# Patient Record
Sex: Female | Born: 1935 | Race: White | Hispanic: No | Smoking: Never smoker
Health system: Southern US, Community
[De-identification: ages and names within clinical notes are randomized; demographics above are authoritative.]

## PROBLEM LIST (undated history)

## (undated) DIAGNOSIS — K661 Hemoperitoneum: Secondary | ICD-10-CM

## (undated) DIAGNOSIS — K5792 Diverticulitis of intestine, part unspecified, without perforation or abscess without bleeding: Secondary | ICD-10-CM

## (undated) DIAGNOSIS — E785 Hyperlipidemia, unspecified: Secondary | ICD-10-CM

## (undated) DIAGNOSIS — N182 Chronic kidney disease, stage 2 (mild): Secondary | ICD-10-CM

## (undated) DIAGNOSIS — I10 Essential (primary) hypertension: Secondary | ICD-10-CM

## (undated) DIAGNOSIS — I2510 Atherosclerotic heart disease of native coronary artery without angina pectoris: Secondary | ICD-10-CM

## (undated) DIAGNOSIS — I4891 Unspecified atrial fibrillation: Secondary | ICD-10-CM

## (undated) DIAGNOSIS — I739 Peripheral vascular disease, unspecified: Secondary | ICD-10-CM

## (undated) DIAGNOSIS — C801 Malignant (primary) neoplasm, unspecified: Secondary | ICD-10-CM

## (undated) DIAGNOSIS — E119 Type 2 diabetes mellitus without complications: Secondary | ICD-10-CM

## (undated) DIAGNOSIS — D509 Iron deficiency anemia, unspecified: Secondary | ICD-10-CM

## (undated) DIAGNOSIS — Z853 Personal history of malignant neoplasm of breast: Secondary | ICD-10-CM

## (undated) DIAGNOSIS — K683 Retroperitoneal hematoma: Secondary | ICD-10-CM

## (undated) DIAGNOSIS — J189 Pneumonia, unspecified organism: Secondary | ICD-10-CM

## (undated) HISTORY — DX: Unspecified atrial fibrillation: I48.91

## (undated) HISTORY — DX: Iron deficiency anemia, unspecified: D50.9

## (undated) HISTORY — DX: Chronic kidney disease, stage 2 (mild): N18.2

## (undated) HISTORY — DX: Malignant (primary) neoplasm, unspecified: C80.1

## (undated) HISTORY — DX: Personal history of malignant neoplasm of breast: Z85.3

## (undated) HISTORY — DX: Pneumonia, unspecified organism: J18.9

## (undated) HISTORY — PX: COLOSTOMY: SHX63

## (undated) HISTORY — DX: Hyperlipidemia, unspecified: E78.5

## (undated) HISTORY — DX: Type 2 diabetes mellitus without complications: E11.9

## (undated) HISTORY — DX: Retroperitoneal hematoma: K68.3

## (undated) HISTORY — DX: Essential (primary) hypertension: I10

## (undated) HISTORY — PX: PARTIAL COLECTOMY: SHX5273

## (undated) HISTORY — DX: Diverticulitis of intestine, part unspecified, without perforation or abscess without bleeding: K57.92

## (undated) HISTORY — DX: Peripheral vascular disease, unspecified: I73.9

## (undated) HISTORY — DX: Atherosclerotic heart disease of native coronary artery without angina pectoris: I25.10

## (undated) HISTORY — PX: CATARACT EXTRACTION: SUR2

## (undated) HISTORY — DX: Hemoperitoneum: K66.1

---

## 1960-10-05 HISTORY — PX: APPENDECTOMY: SHX54

## 1969-10-05 HISTORY — PX: DILATION AND CURETTAGE OF UTERUS: SHX78

## 1993-10-05 HISTORY — PX: DILATION AND CURETTAGE OF UTERUS: SHX78

## 1998-10-05 HISTORY — PX: CARDIAC CATHETERIZATION: SHX172

## 1999-04-23 ENCOUNTER — Inpatient Hospital Stay: Admission: RE | Admit: 1999-04-23 | Discharge: 1999-04-24 | Payer: Self-pay | Admitting: *Deleted

## 1999-09-02 ENCOUNTER — Encounter: Payer: Self-pay | Admitting: Surgery

## 1999-09-02 ENCOUNTER — Ambulatory Visit (HOSPITAL_BASED_OUTPATIENT_CLINIC_OR_DEPARTMENT_OTHER): Admission: RE | Admit: 1999-09-02 | Discharge: 1999-09-02 | Payer: Self-pay | Admitting: Surgery

## 1999-09-15 ENCOUNTER — Encounter: Admission: RE | Admit: 1999-09-15 | Discharge: 1999-12-14 | Payer: Self-pay | Admitting: Radiation Oncology

## 1999-10-14 ENCOUNTER — Encounter: Payer: Self-pay | Admitting: Surgery

## 1999-10-14 ENCOUNTER — Ambulatory Visit (HOSPITAL_COMMUNITY): Admission: RE | Admit: 1999-10-14 | Discharge: 1999-10-15 | Payer: Self-pay | Admitting: Surgery

## 1999-10-14 ENCOUNTER — Encounter (INDEPENDENT_AMBULATORY_CARE_PROVIDER_SITE_OTHER): Payer: Self-pay | Admitting: *Deleted

## 1999-11-06 HISTORY — PX: BREAST SURGERY: SHX581

## 1999-12-15 ENCOUNTER — Encounter: Admission: RE | Admit: 1999-12-15 | Discharge: 2000-03-14 | Payer: Self-pay | Admitting: Radiation Oncology

## 2000-10-13 ENCOUNTER — Other Ambulatory Visit: Admission: RE | Admit: 2000-10-13 | Discharge: 2000-10-13 | Payer: Self-pay | Admitting: Family Medicine

## 2000-12-03 ENCOUNTER — Encounter: Payer: Self-pay | Admitting: Family Medicine

## 2000-12-03 LAB — CONVERTED CEMR LAB
Hgb A1c MFr Bld: 7.2 %
Microalbumin U total vol: 2.9 mg/L

## 2001-09-04 ENCOUNTER — Encounter: Payer: Self-pay | Admitting: Family Medicine

## 2001-09-04 LAB — CONVERTED CEMR LAB: Hgb A1c MFr Bld: 7 %

## 2001-12-03 ENCOUNTER — Encounter: Payer: Self-pay | Admitting: Family Medicine

## 2001-12-03 LAB — CONVERTED CEMR LAB: Hgb A1c MFr Bld: 6.8 %

## 2001-12-23 ENCOUNTER — Other Ambulatory Visit: Admission: RE | Admit: 2001-12-23 | Discharge: 2001-12-23 | Payer: Self-pay | Admitting: Family Medicine

## 2002-01-19 ENCOUNTER — Encounter: Admission: RE | Admit: 2002-01-19 | Discharge: 2002-01-19 | Payer: Self-pay | Admitting: Cardiology

## 2002-01-19 ENCOUNTER — Encounter: Payer: Self-pay | Admitting: Cardiology

## 2002-01-25 ENCOUNTER — Ambulatory Visit (HOSPITAL_COMMUNITY): Admission: RE | Admit: 2002-01-25 | Discharge: 2002-01-25 | Payer: Self-pay | Admitting: Cardiology

## 2002-01-25 HISTORY — PX: CARDIAC CATHETERIZATION: SHX172

## 2002-02-07 ENCOUNTER — Encounter: Payer: Self-pay | Admitting: Cardiothoracic Surgery

## 2002-02-08 ENCOUNTER — Encounter: Payer: Self-pay | Admitting: Cardiothoracic Surgery

## 2002-02-08 ENCOUNTER — Ambulatory Visit (HOSPITAL_COMMUNITY): Admission: RE | Admit: 2002-02-08 | Discharge: 2002-02-08 | Payer: Self-pay | Admitting: Cardiothoracic Surgery

## 2002-02-09 ENCOUNTER — Inpatient Hospital Stay (HOSPITAL_COMMUNITY): Admission: RE | Admit: 2002-02-09 | Discharge: 2002-02-16 | Payer: Self-pay | Admitting: Cardiothoracic Surgery

## 2002-02-09 ENCOUNTER — Encounter: Payer: Self-pay | Admitting: Cardiothoracic Surgery

## 2002-02-09 HISTORY — PX: CORONARY ARTERY BYPASS GRAFT: SHX141

## 2002-02-10 ENCOUNTER — Encounter: Payer: Self-pay | Admitting: Cardiothoracic Surgery

## 2002-02-11 ENCOUNTER — Encounter: Payer: Self-pay | Admitting: Cardiothoracic Surgery

## 2002-02-14 ENCOUNTER — Encounter: Payer: Self-pay | Admitting: Cardiothoracic Surgery

## 2002-02-21 ENCOUNTER — Ambulatory Visit: Admission: RE | Admit: 2002-02-21 | Discharge: 2002-05-22 | Payer: Self-pay | Admitting: Radiation Oncology

## 2002-02-27 ENCOUNTER — Ambulatory Visit (HOSPITAL_COMMUNITY): Admission: RE | Admit: 2002-02-27 | Discharge: 2002-02-27 | Payer: Self-pay | Admitting: Radiation Oncology

## 2002-03-07 ENCOUNTER — Encounter (HOSPITAL_COMMUNITY): Admission: RE | Admit: 2002-03-07 | Discharge: 2002-06-05 | Payer: Self-pay | Admitting: Family Medicine

## 2002-03-10 ENCOUNTER — Encounter: Payer: Self-pay | Admitting: Cardiothoracic Surgery

## 2002-03-10 ENCOUNTER — Encounter: Admission: RE | Admit: 2002-03-10 | Discharge: 2002-03-10 | Payer: Self-pay | Admitting: Cardiothoracic Surgery

## 2002-07-05 ENCOUNTER — Encounter: Payer: Self-pay | Admitting: Family Medicine

## 2002-07-05 LAB — CONVERTED CEMR LAB: Hgb A1c MFr Bld: 7.9 %

## 2002-09-08 ENCOUNTER — Encounter: Payer: Self-pay | Admitting: Radiation Oncology

## 2002-09-08 ENCOUNTER — Ambulatory Visit (HOSPITAL_COMMUNITY): Admission: RE | Admit: 2002-09-08 | Discharge: 2002-09-08 | Payer: Self-pay | Admitting: Radiation Oncology

## 2002-11-23 ENCOUNTER — Emergency Department (HOSPITAL_COMMUNITY): Admission: EM | Admit: 2002-11-23 | Discharge: 2002-11-24 | Payer: Self-pay | Admitting: Emergency Medicine

## 2002-11-23 ENCOUNTER — Encounter: Payer: Self-pay | Admitting: Emergency Medicine

## 2002-11-30 ENCOUNTER — Encounter: Admission: RE | Admit: 2002-11-30 | Discharge: 2002-11-30 | Payer: Self-pay | Admitting: Cardiology

## 2002-11-30 ENCOUNTER — Encounter: Payer: Self-pay | Admitting: Cardiology

## 2002-12-04 ENCOUNTER — Encounter: Admission: RE | Admit: 2002-12-04 | Discharge: 2002-12-04 | Payer: Self-pay | Admitting: Cardiology

## 2002-12-04 ENCOUNTER — Encounter: Payer: Self-pay | Admitting: Cardiology

## 2002-12-07 ENCOUNTER — Ambulatory Visit: Admission: RE | Admit: 2002-12-07 | Discharge: 2002-12-18 | Payer: Self-pay | Admitting: Radiation Oncology

## 2002-12-21 ENCOUNTER — Encounter (INDEPENDENT_AMBULATORY_CARE_PROVIDER_SITE_OTHER): Payer: Self-pay | Admitting: *Deleted

## 2002-12-21 ENCOUNTER — Encounter (INDEPENDENT_AMBULATORY_CARE_PROVIDER_SITE_OTHER): Payer: Self-pay

## 2002-12-21 ENCOUNTER — Encounter: Payer: Self-pay | Admitting: Cardiothoracic Surgery

## 2002-12-21 ENCOUNTER — Ambulatory Visit (HOSPITAL_COMMUNITY): Admission: RE | Admit: 2002-12-21 | Discharge: 2002-12-21 | Payer: Self-pay | Admitting: Cardiothoracic Surgery

## 2003-02-03 ENCOUNTER — Encounter: Payer: Self-pay | Admitting: Family Medicine

## 2003-02-03 HISTORY — PX: SHOULDER ARTHROSCOPY: SHX128

## 2003-02-03 LAB — CONVERTED CEMR LAB
Hgb A1c MFr Bld: 6.7 %
Microalbumin U total vol: 14.1 mg/L

## 2003-02-07 ENCOUNTER — Other Ambulatory Visit: Admission: RE | Admit: 2003-02-07 | Discharge: 2003-02-07 | Payer: Self-pay | Admitting: Family Medicine

## 2003-02-23 ENCOUNTER — Encounter: Admission: RE | Admit: 2003-02-23 | Discharge: 2003-02-23 | Payer: Self-pay | Admitting: Cardiothoracic Surgery

## 2003-02-23 ENCOUNTER — Encounter: Payer: Self-pay | Admitting: Cardiothoracic Surgery

## 2003-03-16 ENCOUNTER — Encounter: Admission: RE | Admit: 2003-03-16 | Discharge: 2003-03-16 | Payer: Self-pay | Admitting: Internal Medicine

## 2003-03-16 ENCOUNTER — Encounter: Payer: Self-pay | Admitting: Orthopaedic Surgery

## 2003-04-02 ENCOUNTER — Encounter: Payer: Self-pay | Admitting: Orthopaedic Surgery

## 2003-04-02 ENCOUNTER — Encounter: Admission: RE | Admit: 2003-04-02 | Discharge: 2003-04-02 | Payer: Self-pay | Admitting: Orthopaedic Surgery

## 2003-04-03 ENCOUNTER — Ambulatory Visit (HOSPITAL_BASED_OUTPATIENT_CLINIC_OR_DEPARTMENT_OTHER): Admission: RE | Admit: 2003-04-03 | Discharge: 2003-04-03 | Payer: Self-pay | Admitting: Orthopaedic Surgery

## 2003-11-30 ENCOUNTER — Encounter: Admission: RE | Admit: 2003-11-30 | Discharge: 2003-11-30 | Payer: Self-pay | Admitting: Cardiothoracic Surgery

## 2003-12-04 ENCOUNTER — Encounter: Payer: Self-pay | Admitting: Family Medicine

## 2003-12-04 LAB — CONVERTED CEMR LAB
Hgb A1c MFr Bld: 6.6 %
Hgb A1c MFr Bld: 6.6 %
Microalbumin U total vol: 2.8 mg/L
Microalbumin U total vol: 2.8 mg/L

## 2004-03-05 ENCOUNTER — Encounter: Payer: Self-pay | Admitting: Family Medicine

## 2004-03-05 LAB — CONVERTED CEMR LAB: Hgb A1c MFr Bld: 6.4 %

## 2004-08-05 ENCOUNTER — Ambulatory Visit: Payer: Self-pay | Admitting: Family Medicine

## 2004-08-05 LAB — CONVERTED CEMR LAB
Hgb A1c MFr Bld: 6.9 %
Hgb A1c MFr Bld: 6.9 %

## 2004-08-07 ENCOUNTER — Ambulatory Visit: Payer: Self-pay | Admitting: Family Medicine

## 2004-08-07 ENCOUNTER — Other Ambulatory Visit: Admission: RE | Admit: 2004-08-07 | Discharge: 2004-08-07 | Payer: Self-pay | Admitting: Family Medicine

## 2004-08-20 ENCOUNTER — Ambulatory Visit: Payer: Self-pay | Admitting: Family Medicine

## 2004-09-11 HISTORY — PX: HEMORRHOID SURGERY: SHX153

## 2004-11-05 ENCOUNTER — Ambulatory Visit: Payer: Self-pay | Admitting: Family Medicine

## 2004-11-05 LAB — CONVERTED CEMR LAB
Hgb A1c MFr Bld: 6.4 %
Hgb A1c MFr Bld: 6.4 %

## 2004-11-07 ENCOUNTER — Ambulatory Visit: Payer: Self-pay | Admitting: Family Medicine

## 2005-05-05 ENCOUNTER — Ambulatory Visit: Payer: Self-pay | Admitting: Family Medicine

## 2005-05-05 LAB — CONVERTED CEMR LAB
Hgb A1c MFr Bld: 7 %
Hgb A1c MFr Bld: 7 %

## 2005-05-07 ENCOUNTER — Ambulatory Visit: Payer: Self-pay | Admitting: Family Medicine

## 2005-07-05 ENCOUNTER — Encounter: Payer: Self-pay | Admitting: Family Medicine

## 2005-07-05 LAB — CONVERTED CEMR LAB
Hgb A1c MFr Bld: 6.4 %
Hgb A1c MFr Bld: 6.4 %

## 2005-08-04 ENCOUNTER — Ambulatory Visit: Payer: Self-pay | Admitting: Family Medicine

## 2005-08-06 ENCOUNTER — Ambulatory Visit: Payer: Self-pay | Admitting: Family Medicine

## 2005-10-05 ENCOUNTER — Encounter: Payer: Self-pay | Admitting: Family Medicine

## 2005-10-05 LAB — CONVERTED CEMR LAB
Hgb A1c MFr Bld: 6.6 %
Hgb A1c MFr Bld: 6.6 %
Microalbumin U total vol: 3.6 mg/L

## 2005-10-22 ENCOUNTER — Ambulatory Visit: Payer: Self-pay | Admitting: Family Medicine

## 2005-10-26 ENCOUNTER — Encounter: Payer: Self-pay | Admitting: Family Medicine

## 2005-10-26 ENCOUNTER — Other Ambulatory Visit: Admission: RE | Admit: 2005-10-26 | Discharge: 2005-10-26 | Payer: Self-pay | Admitting: Family Medicine

## 2005-10-26 ENCOUNTER — Ambulatory Visit: Payer: Self-pay | Admitting: Family Medicine

## 2005-11-18 ENCOUNTER — Ambulatory Visit: Payer: Self-pay | Admitting: Family Medicine

## 2006-01-03 ENCOUNTER — Encounter: Payer: Self-pay | Admitting: Family Medicine

## 2006-01-03 LAB — CONVERTED CEMR LAB
Hgb A1c MFr Bld: 6.5 %
Hgb A1c MFr Bld: 6.5 %

## 2006-01-25 ENCOUNTER — Ambulatory Visit: Payer: Self-pay | Admitting: Family Medicine

## 2006-01-27 ENCOUNTER — Ambulatory Visit: Payer: Self-pay | Admitting: Family Medicine

## 2006-04-04 ENCOUNTER — Encounter: Payer: Self-pay | Admitting: Family Medicine

## 2006-04-04 LAB — CONVERTED CEMR LAB
Hgb A1c MFr Bld: 6.5 %
Hgb A1c MFr Bld: 6.5 %

## 2006-04-20 ENCOUNTER — Ambulatory Visit: Payer: Self-pay | Admitting: Family Medicine

## 2006-04-22 ENCOUNTER — Ambulatory Visit: Payer: Self-pay | Admitting: Family Medicine

## 2006-06-07 ENCOUNTER — Emergency Department (HOSPITAL_COMMUNITY): Admission: EM | Admit: 2006-06-07 | Discharge: 2006-06-07 | Payer: Self-pay | Admitting: Emergency Medicine

## 2006-10-05 ENCOUNTER — Encounter: Payer: Self-pay | Admitting: Family Medicine

## 2006-10-05 LAB — CONVERTED CEMR LAB
Hgb A1c MFr Bld: 7.2 %
Microalbumin U total vol: 24.5 mg/L
Pap Smear: NORMAL

## 2006-10-11 ENCOUNTER — Ambulatory Visit: Payer: Self-pay | Admitting: Family Medicine

## 2006-10-12 ENCOUNTER — Ambulatory Visit: Payer: Self-pay | Admitting: Family Medicine

## 2006-11-03 ENCOUNTER — Encounter: Payer: Self-pay | Admitting: Family Medicine

## 2006-11-03 ENCOUNTER — Other Ambulatory Visit: Admission: RE | Admit: 2006-11-03 | Discharge: 2006-11-03 | Payer: Self-pay | Admitting: Family Medicine

## 2006-11-03 ENCOUNTER — Ambulatory Visit: Payer: Self-pay | Admitting: Family Medicine

## 2006-12-14 ENCOUNTER — Ambulatory Visit: Payer: Self-pay | Admitting: Family Medicine

## 2006-12-23 ENCOUNTER — Ambulatory Visit: Payer: Self-pay | Admitting: Family Medicine

## 2007-01-11 ENCOUNTER — Encounter: Admission: RE | Admit: 2007-01-11 | Discharge: 2007-01-11 | Payer: Self-pay | Admitting: Cardiology

## 2007-01-13 ENCOUNTER — Ambulatory Visit (HOSPITAL_COMMUNITY): Admission: RE | Admit: 2007-01-13 | Discharge: 2007-01-13 | Payer: Self-pay | Admitting: Cardiology

## 2007-01-13 HISTORY — PX: CARDIAC CATHETERIZATION: SHX172

## 2007-03-11 ENCOUNTER — Inpatient Hospital Stay (HOSPITAL_COMMUNITY): Admission: EM | Admit: 2007-03-11 | Discharge: 2007-03-15 | Payer: Self-pay | Admitting: Emergency Medicine

## 2007-04-04 ENCOUNTER — Encounter (INDEPENDENT_AMBULATORY_CARE_PROVIDER_SITE_OTHER): Payer: Self-pay | Admitting: *Deleted

## 2007-04-06 ENCOUNTER — Encounter: Payer: Medicare Other | Admitting: Cardiology

## 2007-04-18 ENCOUNTER — Ambulatory Visit: Payer: Self-pay | Admitting: Family Medicine

## 2007-04-19 ENCOUNTER — Encounter: Payer: Self-pay | Admitting: Family Medicine

## 2007-04-19 ENCOUNTER — Ambulatory Visit: Payer: Self-pay | Admitting: Family Medicine

## 2007-04-19 DIAGNOSIS — I739 Peripheral vascular disease, unspecified: Secondary | ICD-10-CM | POA: Insufficient documentation

## 2007-04-19 DIAGNOSIS — M810 Age-related osteoporosis without current pathological fracture: Secondary | ICD-10-CM | POA: Insufficient documentation

## 2007-04-19 DIAGNOSIS — I10 Essential (primary) hypertension: Secondary | ICD-10-CM

## 2007-04-19 DIAGNOSIS — I4891 Unspecified atrial fibrillation: Secondary | ICD-10-CM

## 2007-04-19 DIAGNOSIS — I2510 Atherosclerotic heart disease of native coronary artery without angina pectoris: Secondary | ICD-10-CM

## 2007-04-19 DIAGNOSIS — E785 Hyperlipidemia, unspecified: Secondary | ICD-10-CM

## 2007-04-19 DIAGNOSIS — E1159 Type 2 diabetes mellitus with other circulatory complications: Secondary | ICD-10-CM

## 2007-04-19 LAB — CONVERTED CEMR LAB
ALT: 13 units/L (ref 0–35)
AST: 17 units/L (ref 0–37)
Basophils Absolute: 0 10*3/uL (ref 0.0–0.1)
Basophils Relative: 0.6 % (ref 0.0–1.0)
Cholesterol: 162 mg/dL (ref 0–200)
Eosinophils Absolute: 0.2 10*3/uL (ref 0.0–0.6)
Eosinophils Relative: 3.1 % (ref 0.0–5.0)
HCT: 33.1 % — ABNORMAL LOW (ref 36.0–46.0)
HDL: 41.9 mg/dL (ref >39.0)
Hemoglobin: 11.1 g/dL — ABNORMAL LOW (ref 12.0–15.0)
Hgb A1c MFr Bld: 6.8 % — ABNORMAL HIGH (ref 4.6–6.0)
Iron: 61 ug/dL (ref 42–145)
LDL Cholesterol: 83 mg/dL (ref 0–99)
Lymphocytes Relative: 23.7 % (ref 12.0–46.0)
MCHC: 33.4 g/dL (ref 30.0–36.0)
MCV: 90.5 fL (ref 78.0–100.0)
Monocytes Absolute: 0.8 10*3/uL — ABNORMAL HIGH (ref 0.2–0.7)
Monocytes Relative: 11 % (ref 3.0–11.0)
Neutro Abs: 4.3 10*3/uL (ref 1.4–7.7)
Neutrophils Relative %: 61.6 % (ref 43.0–77.0)
Platelets: 296 10*3/uL (ref 150–400)
RBC: 3.66 M/uL — ABNORMAL LOW (ref 3.87–5.11)
RDW: 14.4 % (ref 11.5–14.6)
Total CHOL/HDL Ratio: 3.9
Triglycerides: 184 mg/dL — ABNORMAL HIGH (ref 0–149)
VLDL: 37 mg/dL (ref 0–40)
WBC: 6.9 10*3/uL (ref 4.5–10.5)

## 2007-04-20 ENCOUNTER — Ambulatory Visit: Payer: Self-pay | Admitting: Family Medicine

## 2007-04-20 DIAGNOSIS — D509 Iron deficiency anemia, unspecified: Secondary | ICD-10-CM

## 2007-04-21 ENCOUNTER — Encounter: Payer: Self-pay | Admitting: Family Medicine

## 2007-05-06 ENCOUNTER — Encounter: Payer: Self-pay | Admitting: Family Medicine

## 2007-05-06 ENCOUNTER — Encounter: Payer: Medicare Other | Admitting: Cardiology

## 2007-05-09 ENCOUNTER — Telehealth (INDEPENDENT_AMBULATORY_CARE_PROVIDER_SITE_OTHER): Payer: Self-pay | Admitting: *Deleted

## 2007-05-10 ENCOUNTER — Encounter: Payer: Self-pay | Admitting: Family Medicine

## 2007-05-31 ENCOUNTER — Encounter: Payer: Self-pay | Admitting: Family Medicine

## 2007-06-06 ENCOUNTER — Encounter: Payer: Medicare Other | Admitting: Cardiology

## 2007-07-06 ENCOUNTER — Encounter: Payer: Medicare Other | Admitting: Cardiology

## 2007-07-28 ENCOUNTER — Ambulatory Visit: Payer: Self-pay | Admitting: Family Medicine

## 2007-08-25 ENCOUNTER — Encounter: Payer: Self-pay | Admitting: Family Medicine

## 2007-11-07 ENCOUNTER — Ambulatory Visit: Payer: Self-pay | Admitting: Family Medicine

## 2007-11-07 LAB — CONVERTED CEMR LAB
ALT: 14 units/L (ref 0–35)
Basophils Relative: 0.3 % (ref 0.0–1.0)
Bilirubin, Direct: 0.1 mg/dL (ref 0.0–0.3)
CO2: 28 meq/L (ref 19–32)
Calcium: 9.3 mg/dL (ref 8.4–10.5)
Creatinine,U: 98.3 mg/dL
Eosinophils Absolute: 0.2 10*3/uL (ref 0.0–0.6)
Eosinophils Relative: 2.9 % (ref 0.0–5.0)
GFR calc Af Amer: 57 mL/min
GFR calc non Af Amer: 47 mL/min
Glucose, Bld: 82 mg/dL (ref 70–99)
HCT: 36.2 % (ref 36.0–46.0)
Hemoglobin: 11.7 g/dL — ABNORMAL LOW (ref 12.0–15.0)
Hgb A1c MFr Bld: 7.1 % — ABNORMAL HIGH (ref 4.6–6.0)
Lymphocytes Relative: 24.4 % (ref 12.0–46.0)
MCV: 92.1 fL (ref 78.0–100.0)
Microalb Creat Ratio: 23.4 mg/g (ref 0.0–30.0)
Monocytes Absolute: 0.8 10*3/uL — ABNORMAL HIGH (ref 0.2–0.7)
Neutro Abs: 4.9 10*3/uL (ref 1.4–7.7)
Neutrophils Relative %: 62.6 % (ref 43.0–77.0)
Platelets: 285 10*3/uL (ref 150–400)
Potassium: 4.5 meq/L (ref 3.5–5.1)
Sodium: 142 meq/L (ref 135–145)
Total Protein: 6.6 g/dL (ref 6.0–8.3)
Triglycerides: 178 mg/dL — ABNORMAL HIGH (ref 0–149)
VLDL: 36 mg/dL (ref 0–40)
WBC: 7.8 10*3/uL (ref 4.5–10.5)

## 2007-11-08 ENCOUNTER — Ambulatory Visit: Payer: Self-pay | Admitting: Family Medicine

## 2007-11-08 ENCOUNTER — Other Ambulatory Visit: Admission: RE | Admit: 2007-11-08 | Discharge: 2007-11-08 | Payer: Self-pay | Admitting: Family Medicine

## 2007-11-08 ENCOUNTER — Encounter: Payer: Self-pay | Admitting: Family Medicine

## 2007-11-08 LAB — CONVERTED CEMR LAB: Pap Smear: NORMAL

## 2007-11-11 ENCOUNTER — Encounter (INDEPENDENT_AMBULATORY_CARE_PROVIDER_SITE_OTHER): Payer: Self-pay | Admitting: *Deleted

## 2007-11-16 ENCOUNTER — Encounter (INDEPENDENT_AMBULATORY_CARE_PROVIDER_SITE_OTHER): Payer: Self-pay | Admitting: *Deleted

## 2007-11-30 ENCOUNTER — Encounter: Payer: Self-pay | Admitting: Family Medicine

## 2008-01-06 ENCOUNTER — Ambulatory Visit: Payer: Self-pay | Admitting: Family Medicine

## 2008-01-06 LAB — CONVERTED CEMR LAB
OCCULT 2: NEGATIVE
OCCULT 3: NEGATIVE

## 2008-01-09 ENCOUNTER — Encounter (INDEPENDENT_AMBULATORY_CARE_PROVIDER_SITE_OTHER): Payer: Self-pay | Admitting: *Deleted

## 2008-03-30 ENCOUNTER — Encounter: Payer: Self-pay | Admitting: Family Medicine

## 2008-04-04 ENCOUNTER — Encounter: Payer: Self-pay | Admitting: Family Medicine

## 2008-04-10 ENCOUNTER — Encounter (INDEPENDENT_AMBULATORY_CARE_PROVIDER_SITE_OTHER): Payer: Self-pay | Admitting: *Deleted

## 2008-05-08 ENCOUNTER — Ambulatory Visit: Payer: Self-pay | Admitting: Family Medicine

## 2008-05-24 ENCOUNTER — Ambulatory Visit: Payer: Self-pay | Admitting: Family Medicine

## 2008-05-24 DIAGNOSIS — R0989 Other specified symptoms and signs involving the circulatory and respiratory systems: Secondary | ICD-10-CM | POA: Insufficient documentation

## 2008-05-28 LAB — CONVERTED CEMR LAB: Vit D, 1,25-Dihydroxy: 11 — ABNORMAL LOW (ref 30–89)

## 2008-07-02 ENCOUNTER — Ambulatory Visit: Payer: Self-pay | Admitting: Family Medicine

## 2008-07-30 ENCOUNTER — Ambulatory Visit: Payer: Self-pay | Admitting: Family Medicine

## 2008-07-31 LAB — CONVERTED CEMR LAB: Vit D, 1,25-Dihydroxy: 24 — ABNORMAL LOW (ref 30–89)

## 2008-08-01 ENCOUNTER — Ambulatory Visit: Payer: Self-pay | Admitting: Family Medicine

## 2008-10-09 ENCOUNTER — Encounter: Payer: Self-pay | Admitting: Family Medicine

## 2008-11-21 ENCOUNTER — Ambulatory Visit: Payer: Self-pay | Admitting: Family Medicine

## 2008-11-22 LAB — CONVERTED CEMR LAB
Basophils Absolute: 0.1 10*3/uL (ref 0.0–0.1)
Basophils Relative: 1.6 % (ref 0.0–3.0)
Calcium: 9.4 mg/dL (ref 8.4–10.5)
Cholesterol: 178 mg/dL (ref 0–200)
Creatinine, Ser: 1.2 mg/dL (ref 0.4–1.2)
Creatinine,U: 72.2 mg/dL
Direct LDL: 87.3 mg/dL
Eosinophils Absolute: 0.3 10*3/uL (ref 0.0–0.7)
GFR calc Af Amer: 57 mL/min
GFR calc non Af Amer: 47 mL/min
HDL: 39.7 mg/dL (ref >39.0)
Hemoglobin: 12.3 g/dL (ref 12.0–15.0)
Hgb A1c MFr Bld: 7.2 % — ABNORMAL HIGH (ref 4.6–6.0)
MCHC: 34.1 g/dL (ref 30.0–36.0)
MCV: 91.9 fL (ref 78.0–100.0)
Microalb Creat Ratio: 5.5 mg/g (ref 0.0–30.0)
Microalb, Ur: 0.4 mg/dL (ref 0.0–1.9)
Neutro Abs: 4.2 10*3/uL (ref 1.4–7.7)
RBC: 3.94 M/uL (ref 3.87–5.11)
RDW: 13.9 % (ref 11.5–14.6)
Sodium: 140 meq/L (ref 135–145)
Total Bilirubin: 0.8 mg/dL (ref 0.3–1.2)

## 2008-11-28 ENCOUNTER — Other Ambulatory Visit: Admission: RE | Admit: 2008-11-28 | Discharge: 2008-11-28 | Payer: Self-pay | Admitting: Family Medicine

## 2008-11-28 ENCOUNTER — Ambulatory Visit: Payer: Self-pay | Admitting: Family Medicine

## 2008-11-28 ENCOUNTER — Encounter: Payer: Self-pay | Admitting: Family Medicine

## 2008-11-28 DIAGNOSIS — E559 Vitamin D deficiency, unspecified: Secondary | ICD-10-CM

## 2008-11-28 LAB — HM PAP SMEAR

## 2008-11-30 ENCOUNTER — Encounter (INDEPENDENT_AMBULATORY_CARE_PROVIDER_SITE_OTHER): Payer: Self-pay | Admitting: *Deleted

## 2008-12-05 ENCOUNTER — Ambulatory Visit: Payer: Self-pay | Admitting: Family Medicine

## 2008-12-05 LAB — CONVERTED CEMR LAB: OCCULT 2: NEGATIVE

## 2008-12-10 ENCOUNTER — Encounter (INDEPENDENT_AMBULATORY_CARE_PROVIDER_SITE_OTHER): Payer: Self-pay | Admitting: *Deleted

## 2009-02-11 ENCOUNTER — Encounter: Payer: Self-pay | Admitting: Family Medicine

## 2009-02-13 ENCOUNTER — Encounter: Payer: Self-pay | Admitting: Family Medicine

## 2009-02-22 ENCOUNTER — Ambulatory Visit: Payer: Self-pay | Admitting: Family Medicine

## 2009-02-25 LAB — CONVERTED CEMR LAB: Vit D, 25-Hydroxy: 36 ng/mL (ref 30–89)

## 2009-02-26 ENCOUNTER — Ambulatory Visit: Payer: Self-pay | Admitting: Family Medicine

## 2009-04-05 ENCOUNTER — Encounter: Payer: Self-pay | Admitting: Family Medicine

## 2009-05-10 ENCOUNTER — Encounter: Payer: Self-pay | Admitting: Family Medicine

## 2009-07-01 ENCOUNTER — Ambulatory Visit: Payer: Self-pay | Admitting: Family Medicine

## 2009-07-01 LAB — CONVERTED CEMR LAB: Hgb A1c MFr Bld: 7.3 % — ABNORMAL HIGH (ref 4.6–6.5)

## 2009-07-02 LAB — CONVERTED CEMR LAB: Vit D, 25-Hydroxy: 33 ng/mL (ref 30–89)

## 2009-07-04 ENCOUNTER — Ambulatory Visit: Payer: Self-pay | Admitting: Family Medicine

## 2009-07-11 ENCOUNTER — Ambulatory Visit: Payer: Medicare Other | Admitting: Ophthalmology

## 2009-07-22 ENCOUNTER — Ambulatory Visit: Payer: Medicare Other | Admitting: Ophthalmology

## 2009-08-26 ENCOUNTER — Ambulatory Visit: Payer: Self-pay | Admitting: Family Medicine

## 2009-08-26 LAB — CONVERTED CEMR LAB
Bilirubin Urine: NEGATIVE
Glucose, Urine, Semiquant: NEGATIVE
Ketones, urine, test strip: NEGATIVE
Specific Gravity, Urine: 1.015

## 2009-09-18 ENCOUNTER — Encounter: Payer: Self-pay | Admitting: Family Medicine

## 2009-10-29 ENCOUNTER — Ambulatory Visit: Payer: Medicare Other | Admitting: Ophthalmology

## 2009-11-11 ENCOUNTER — Ambulatory Visit: Payer: Medicare Other | Admitting: Ophthalmology

## 2009-12-02 ENCOUNTER — Ambulatory Visit: Payer: Self-pay | Admitting: Family Medicine

## 2009-12-02 LAB — CONVERTED CEMR LAB
AST: 20 units/L (ref 0–37)
Albumin: 3.8 g/dL (ref 3.5–5.2)
Alkaline Phosphatase: 41 units/L (ref 39–117)
BUN: 33 mg/dL — ABNORMAL HIGH (ref 6–23)
Basophils Relative: 0.9 % (ref 0.0–3.0)
CO2: 28 meq/L (ref 19–32)
Calcium: 9.7 mg/dL (ref 8.4–10.5)
Cholesterol: 149 mg/dL (ref 0–200)
Creatinine,U: 71.4 mg/dL
Eosinophils Absolute: 0.3 10*3/uL (ref 0.0–0.7)
Eosinophils Relative: 4.2 % (ref 0.0–5.0)
GFR calc non Af Amer: 46.89 mL/min (ref >60)
Glucose, Bld: 75 mg/dL (ref 70–99)
HCT: 35.4 % — ABNORMAL LOW (ref 36.0–46.0)
Lymphs Abs: 1.9 10*3/uL (ref 0.7–4.0)
MCHC: 32.5 g/dL (ref 30.0–36.0)
MCV: 93.9 fL (ref 78.0–100.0)
Microalb Creat Ratio: 9.8 mg/g (ref 0.0–30.0)
Monocytes Absolute: 0.6 10*3/uL (ref 0.1–1.0)
Neutro Abs: 4.2 10*3/uL (ref 1.4–7.7)
RBC: 3.77 M/uL — ABNORMAL LOW (ref 3.87–5.11)
Sodium: 142 meq/L (ref 135–145)
Total Protein: 6.4 g/dL (ref 6.0–8.3)
VLDL: 29.4 mg/dL (ref 0.0–40.0)
WBC: 7.1 10*3/uL (ref 4.5–10.5)

## 2009-12-03 LAB — CONVERTED CEMR LAB: Vit D, 25-Hydroxy: 47 ng/mL (ref 30–89)

## 2009-12-05 ENCOUNTER — Ambulatory Visit: Payer: Self-pay | Admitting: Family Medicine

## 2009-12-20 ENCOUNTER — Ambulatory Visit: Payer: Self-pay | Admitting: Family Medicine

## 2009-12-20 LAB — CONVERTED CEMR LAB
OCCULT 1: NEGATIVE
OCCULT 2: NEGATIVE
OCCULT 3: NEGATIVE

## 2009-12-23 ENCOUNTER — Encounter (INDEPENDENT_AMBULATORY_CARE_PROVIDER_SITE_OTHER): Payer: Self-pay | Admitting: *Deleted

## 2010-02-12 ENCOUNTER — Encounter: Admission: RE | Admit: 2010-02-12 | Discharge: 2010-02-12 | Payer: Self-pay | Admitting: Cardiology

## 2010-02-14 ENCOUNTER — Inpatient Hospital Stay (HOSPITAL_COMMUNITY): Admission: RE | Admit: 2010-02-14 | Discharge: 2010-02-16 | Payer: Self-pay | Admitting: Cardiology

## 2010-02-14 HISTORY — PX: CARDIAC CATHETERIZATION: SHX172

## 2010-03-19 ENCOUNTER — Encounter: Payer: Self-pay | Admitting: Family Medicine

## 2010-03-25 ENCOUNTER — Ambulatory Visit: Payer: Self-pay | Admitting: Family Medicine

## 2010-03-25 LAB — CONVERTED CEMR LAB: HDL goal, serum: 40 mg/dL

## 2010-04-02 ENCOUNTER — Telehealth: Payer: Self-pay | Admitting: Family Medicine

## 2010-04-16 ENCOUNTER — Encounter: Payer: Self-pay | Admitting: Family Medicine

## 2010-05-08 ENCOUNTER — Encounter (INDEPENDENT_AMBULATORY_CARE_PROVIDER_SITE_OTHER): Payer: Self-pay | Admitting: *Deleted

## 2010-06-12 ENCOUNTER — Encounter: Payer: Self-pay | Admitting: Family Medicine

## 2010-07-18 ENCOUNTER — Telehealth (INDEPENDENT_AMBULATORY_CARE_PROVIDER_SITE_OTHER): Payer: Self-pay | Admitting: *Deleted

## 2010-07-21 ENCOUNTER — Ambulatory Visit: Payer: Self-pay | Admitting: Family Medicine

## 2010-07-21 ENCOUNTER — Encounter: Payer: Self-pay | Admitting: Family Medicine

## 2010-07-21 LAB — CONVERTED CEMR LAB
ALT: 9 units/L (ref 0–35)
Albumin: 3.8 g/dL (ref 3.5–5.2)
BUN: 34 mg/dL — ABNORMAL HIGH (ref 6–23)
Basophils Relative: 0.5 % (ref 0.0–3.0)
Bilirubin, Direct: 0.1 mg/dL (ref 0.0–0.3)
CO2: 25 meq/L (ref 19–32)
Chloride: 107 meq/L (ref 96–112)
Creatinine, Ser: 1.1 mg/dL (ref 0.4–1.2)
Eosinophils Absolute: 0.2 10*3/uL (ref 0.0–0.7)
Glucose, Bld: 65 mg/dL — ABNORMAL LOW (ref 70–99)
HCT: 32.9 % — ABNORMAL LOW (ref 36.0–46.0)
Hemoglobin: 11 g/dL — ABNORMAL LOW (ref 12.0–15.0)
Hgb A1c MFr Bld: 7.4 % — ABNORMAL HIGH (ref 4.6–6.5)
Lymphocytes Relative: 23.5 % (ref 12.0–46.0)
MCHC: 33.4 g/dL (ref 30.0–36.0)
Microalb Creat Ratio: 2 mg/g (ref 0.0–30.0)
Monocytes Relative: 10.6 % (ref 3.0–12.0)
Neutro Abs: 4.4 10*3/uL (ref 1.4–7.7)
RBC: 3.58 M/uL — ABNORMAL LOW (ref 3.87–5.11)
Total CHOL/HDL Ratio: 4
Total Protein: 6.3 g/dL (ref 6.0–8.3)
Triglycerides: 133 mg/dL (ref 0.0–149.0)

## 2010-07-23 ENCOUNTER — Encounter: Payer: Self-pay | Admitting: Family Medicine

## 2010-07-24 ENCOUNTER — Ambulatory Visit: Payer: Self-pay | Admitting: Family Medicine

## 2010-10-02 ENCOUNTER — Encounter: Payer: Self-pay | Admitting: Family Medicine

## 2010-10-21 ENCOUNTER — Encounter: Payer: Self-pay | Admitting: Family Medicine

## 2010-10-21 LAB — HM MAMMOGRAPHY: HM Mammogram: ABNORMAL

## 2010-11-04 NOTE — Assessment & Plan Note (Signed)
Summary: CPX / LFW   Vital Signs:  Patient profile:   76 year old female Weight:      163 pounds Temp:     97.8 degrees F oral Pulse rate:   68 / minute Pulse rhythm:   regular BP sitting:   118 / 60  (left arm) Cuff size:   large  Vitals Entered By: Sydell Axon LPN (December 05, 1608 9:21 AM) CC: 30 Minute checkup, hemoccult cards given to patient   History of Present Illness: Pt here for followup. She feels well with no complaints. She fell in Dec and felt she had cracked a rib...it is still tender with certain movemnts. She had been on a stool at midnight in her closet and fell off the stool hitting a rack with her back. She has had two cattaract surgeries.  Preventive Screening-Counseling & Management  Alcohol-Tobacco     Alcohol drinks/day: 0     Smoking Status: never     Passive Smoke Exposure: no  Caffeine-Diet-Exercise     Caffeine use/day: 1     Does Patient Exercise: no     Type of exercise: walks     Times/week: 7  Problems Prior to Update: 1)  Uti  (ICD-599.0) 2)  Unspecified Vitamin D Deficiency  (ICD-268.9) 3)  Carotid Bruit  (ICD-785.9) 4)  Special Screening Malig Neoplasms Other Sites  (ICD-V76.49) 5)  Peripheral Vascular Disease  (ICD-443.9) 6)  Osteoporosis  (ICD-733.00) 7)  Hypertension  (ICD-401.9) 8)  Hyperlipidemia  (ICD-272.4) 9)  Diabetes Mellitus, Type II  (ICD-250.00) 10)  Coronary Artery Disease  (ICD-414.00) 11)  Breast Cancer, Hx Of, Right Breast  (ICD-V10.3) 12)  Atrial Fibrillation  (ICD-427.31) 13)  Anemia-iron Deficiency  (ICD-280.9)  Medications Prior to Update: 1)  Humalog 100 Unit/ml Soln (Insulin Lispro (Human)) .... Four Times A Day As Needed 2)  Metformin Hcl 500 Mg Tabs (Metformin Hcl) .Marland Kitchen.. 1 By Mouth At Bedtime 3)  Actos 45 Mg Tabs (Pioglitazone Hcl) .Marland Kitchen.. 1 Once Daily 4)  Sliding Scale Insulin 5)  Isosorbide Dinitrate 30 Mg  Tabs (Isosorbide Dinitrate) .Marland Kitchen.. 1 Daily By Mouth 6)  Benazepril Hcl 10 Mg  Tabs (Benazepril Hcl) .Marland Kitchen..  1 Daily By Mouth 7)  Nitroquick 0.4 Mg  Subl (Nitroglycerin) .... As Directed 8)  Simvastatin 40 Mg  Tabs (Simvastatin) .Marland Kitchen.. 1 Tablet By Mouth At Bedtime 9)  Triamterene-Hctz 37.5-25 Mg  Caps (Triamterene-Hctz) .Marland Kitchen.. 1daily By Mouth 10)  Sotalol Hcl (Af) 80 Mg  Tabs (Sotalol Hcl Af) .... 1/2 Twice A Day By Mouth 11)  Plavix 75 Mg  Tabs (Clopidogrel Bisulfate) .Marland Kitchen.. 1 Daily By Mouth 12)  Cartia Xt 120 Mg  Cp24 (Diltiazem Hcl Coated Beads) .Marland Kitchen.. 1 Daily By Mouth 13)  Glyburide-Metformin 5-500 Mg  Tabs (Glyburide-Metformin) .... 2 Tablets Am 14)  Onetouch Ultra Test   Strp (Glucose Blood) .... Test Four Times A Day For Sliding Scale 15)  Bd Insulin Syringe Ultrafine 31g X 5/16" 0.3 Ml  Misc (Insulin Syringe-Needle U-100) .... Use Daily and As Directed Sliding Scale 16)  Vitamin D 1000 Unit Caps (Cholecalciferol) .Marland Kitchen.. 1 Tablet Twice A Day 17)  Bayer Aspirin Ec Low Dose 81 Mg Tbec (Aspirin) .Marland Kitchen.. 1 Daily By Mouth 18)  Fish Oil 1200 Mg Caps (Omega-3 Fatty Acids) .Marland Kitchen.. 1 Tablet Twice A Day By Mouth 19)  Chewable Calcium/d 300-100 Mg-Unit Chew (Calcium-Vitamin D) .... Two Times A Day 20)  Cipro 500 Mg Tabs (Ciprofloxacin Hcl) .... One Taqb By Mouth Two  Times A Day  Allergies: 1)  ! * Tape  Past History:  Past Medical History: Last updated: 04/19/2007 Anemia-iron deficiency Atrial fibrillation Breast cancer, hx of Coronary artery disease Diabetes mellitus, type II Hyperlipidemia Hypertension Osteoporosis Peripheral vascular disease  Family History: Last updated: 2008-12-26 Father: Died 74 MI, HTN Mother: Died 22 [facility] Home, HTN Siblings: 3 brothers, 1 died September 09, 2023 HTN, CHF, bronchial CA, frequent pneumonia,                                      Agent Orange, (smoker), B-9 brain tumor                                  1 A (+) HTN, high chol. CABG                                  1 A (+) HTN, gout, obese CV:  (+) Father, died MI HBP:  (+) Father, Mother, brother, aunts, uncles, self DM:  (+)  Mother, self, uncle Breast CA:  Self, aunt Stroke:  (-)  Social History: Last updated: 04/19/2007 Never Smoked Alcohol use-no Drug use-no Marital Status: Married, lives with husband Children: 2, out of home Occupation: Retired, [employer] cafeteria/cook  Risk Factors: Alcohol Use: 0 (12/05/2009) Caffeine Use: 1 (12/05/2009) Exercise: no (12/05/2009)  Risk Factors: Smoking Status: never (12/05/2009) Passive Smoke Exposure: no (12/05/2009)  Past Surgical History: 1962         Appendectomy 1971         D&C  Miscarriage                  D&C  Abnml Bleeding 1995         D&C  Menorrhagia 1999          ETT  Rec to Cath   declined 2000          Cath/ Iliac Stent (Dr Guinevere Scarlet) yearly f/u 11/1999       Breast Quadrectomy  Breast Ca  (Dr Daphine Deutscher) 03/31/00      MRI Head - reportedly normal 01/06/02        Carotid U/S, mild plaque, bilateral essentially nml. 01/06/02        LE arterial study   Right superficial femoral 50% reduced flow Left superficial flow >50% 01/06/02        Renal artery U/S, right > 60% reduced flow  Left <60% 01/25/02      Cath, high grade LAD disease, total of 3 vess. involvement 02/09/02        CABG x 4 ([place]) 02/08/02        Chest CT, ? bone islands vs. mets vertebrae 5/03           MRI thoracic spine - bone island - multiple disc herniations 09/08/02      MRI - no change  - No f/u required 12/05/02        Chest CT wedge mass LLL with erosion 3/04           CT guided transthoracic needle biopsy - normal path 5/04            Abd/pelvic CT - nml. lung changes per above (done due to elevated LFT's) 5/04  Right shoulder arthroscopy with rotator cuff debridement 2/05           CT chest, resolved, lung changes of 2/04 09/11/04      Hemm binding Daphine Deutscher) 8/07            Left ACL strain 01/11/07        Cardiolite Pos - Cath 01/13/07      Cath - SV graft to diag. occluded but good  backfill  o/w ok 6/6-6/10/08  Hosp. Subendocardial  MI, stent 02/11/09      Stress Myoview EF 66%Mild  Isch, impr  10/10 and 2/11  Catarract surgeries  Review of Systems General:  Denies chills, fatigue, fever, sweats, weakness, and weight loss. Eyes:  Denies blurring, discharge, and eye pain. ENT:  Denies decreased hearing, earache, and ringing in ears. CV:  Denies chest pain or discomfort, fainting, fatigue, palpitations, shortness of breath with exertion, swelling of feet, swelling of hands, and weight gain. Resp:  Denies cough, shortness of breath, and wheezing. GI:  Denies excessive appetite, gas, and hemorrhoids; intermittent loose bowels. GU:  Denies discharge, dysuria, nocturia, and urinary frequency. MS:  Complains of joint pain, muscle aches, muscle weakness, and stiffness; denies low back pain. Derm:  Denies dryness, itching, and rash. Neuro:  Complains of poor balance; denies numbness, tingling, and tremors; needs to be more careful.Marland Kitchen  Physical Exam  General:  Well-developed,well-nourished,in no acute distress; alert,appropriate and cooperative throughout examination Head:  Normocephalic and atraumatic without obvious abnormalities. No apparent alopecia or balding. Sinuses nontender. Eyes:  Conjunctiva clear bilaterally. PERRL,  EOMI. Ears:  External ear exam shows no significant lesions or deformities.  Otoscopic examination reveals clear canals, tympanic membranes are intact bilaterally without bulging, retraction, inflammation or discharge. Hearing is grossly normal bilaterally. Cerumen bilat. Nose:  External nasal examination shows no deformity or inflammation. Nasal mucosa are pink and moist without lesions or exudates. Mouth:  Oral mucosa and oropharynx without lesions or exudates.  Teeth in good repair. Neck:  No deformities, masses, or tenderness noted. Chest Wall:  No deformities, masses, or tenderness noted. Breasts:  No mass, nodules, thickening, tenderness, bulging, retraction, inflamation, nipple discharge or skin changes noted.  R breast well healed. Lungs:  Normal  respiratory effort, chest expands symmetrically. Lungs are clear to auscultation, no crackles or wheezes. Heart:   S1 and S2 normal without gallop, murmur, click, rub or other extra sounds. Carotid with faint bruit on left. Abdomen:  Bowel sounds positive,abdomen soft and non-tender without masses, organomegaly or hernias noted. Rectal:  No external abnormalities noted. Normal sphincter tone. No rectal masses or tenderness. G neg. Genitalia:  Pelvic Exam:        External: normal female genitalia without lesions or masses        Vagina: normal without lesions or masses        Cervix: normal without lesions or masses        Adnexa: normal bimanual exam without masses or fullness        Uterus: normal by palpation        Pap smear: not performed Msk:  No deformity or scoliosis noted of thoracic or lumbar spine.   Pulses:  R and L carotid,radial,femoral,dorsalis pedis and posterior tibial pulses are full and equal bilaterally Extremities:  No clubbing, cyanosis, edema, or deformity noted with normal full range of motion of all joints.   Neurologic:  No cranial nerve deficits noted. Station and gait are normal. Plantar reflexes are down-going bilaterally. DTRs are  symmetrical throughout. Sensory, motor and coordinative functions appear intact. Skin:  Intact without suspicious lesions or rashes Cervical Nodes:  No lymphadenopathy noted Axillary Nodes:  No palpable lymphadenopathy Inguinal Nodes:  No significant adenopathy Psych:  Cognition and judgment appear intact. Alert and cooperative with normal attention span and concentration. No apparent delusions, illusions, hallucinations   Impression & Recommendations:  Problem # 1:  UNSPECIFIED VITAMIN D DEFICIENCY (ICD-268.9) Assessment Improved Therapeutic, cont curr med.  Problem # 2:  HYPERTENSION (ICD-401.9) Assessment: Unchanged Stable, cont curr meds. Her updated medication list for this problem includes:    Benazepril Hcl 10 Mg Tabs  (Benazepril hcl) .Marland Kitchen... 1 daily by mouth    Triamterene-hctz 37.5-25 Mg Caps (Triamterene-hctz) .Marland Kitchen... 1daily by mouth    Sotalol Hcl (af) 80 Mg Tabs (Sotalol hcl af) .Marland Kitchen... 1/2 twice a day by mouth    Cartia Xt 120 Mg Cp24 (Diltiazem hcl coated beads) .Marland Kitchen... 1 daily by mouth  BP today: 118/60 Prior BP: 138/68 (08/26/2009)  Labs Reviewed: K+: 5.3 (12/02/2009) Creat: : 1.2 (12/02/2009)   Chol: 149 (12/02/2009)   HDL: 47.90 (12/02/2009)   LDL: 72 (12/02/2009)   TG: 147.0 (12/02/2009)  Problem # 3:  HYPERLIPIDEMIA (ICD-272.4) Assessment: Unchanged Good control. Her updated medication list for this problem includes:    Simvastatin 40 Mg Tabs (Simvastatin) .Marland Kitchen... 1 tablet by mouth at bedtime  Labs Reviewed: SGOT: 20 (12/02/2009)   SGPT: 11 (12/02/2009)   HDL:47.90 (12/02/2009), 39.7 (11/21/2008)  LDL:72 (12/02/2009), DEL (04/54/0981)  Chol:149 (12/02/2009), 178 (11/21/2008)  Trig:147.0 (12/02/2009), 234 (11/21/2008)  Problem # 4:  DIABETES MELLITUS, TYPE II (ICD-250.00) Assessment: Unchanged God control. Cont as is but try to cut down on the chocolate. Her updated medication list for this problem includes:    Humalog 100 Unit/ml Soln (Insulin lispro (human)) .Marland Kitchen... Four times a day as needed    Metformin Hcl 500 Mg Tabs (Metformin hcl) .Marland Kitchen... 1 by mouth at bedtime    Actos 45 Mg Tabs (Pioglitazone hcl) .Marland Kitchen... 1 once daily    Benazepril Hcl 10 Mg Tabs (Benazepril hcl) .Marland Kitchen... 1 daily by mouth    Glyburide-metformin 5-500 Mg Tabs (Glyburide-metformin) .Marland Kitchen... 2 tablets am    Bayer Aspirin Ec Low Dose 81 Mg Tbec (Aspirin) .Marland Kitchen... 1 daily by mouth  Labs Reviewed: Creat: 1.2 (12/02/2009)   Microalbumin: 24.5 (10/05/2006) Reviewed HgBA1c results: 7.2 (12/02/2009)  7.3 (07/01/2009)  Problem # 5:  BREAST CANCER, HX OF, RIGHT BREAST (ICD-V10.3) Assessment: Unchanged Stable exam, mammo in July.  Problem # 6:  ATRIAL FIBRILLATION (ICD-427.31) Assessment: Unchanged Stable with great rate control. Her  updated medication list for this problem includes:    Sotalol Hcl (af) 80 Mg Tabs (Sotalol hcl af) .Marland Kitchen... 1/2 twice a day by mouth    Plavix 75 Mg Tabs (Clopidogrel bisulfate) .Marland Kitchen... 1 daily by mouth    Cartia Xt 120 Mg Cp24 (Diltiazem hcl coated beads) .Marland Kitchen... 1 daily by mouth    Bayer Aspirin Ec Low Dose 81 Mg Tbec (Aspirin) .Marland Kitchen... 1 daily by mouth  Problem # 7:  ANEMIA-IRON DEFICIENCY (ICD-280.9) Assessment: Unchanged Iron stav\ble but continues mildly anemic, presumably from myelodysplasia and chronic disease. Cont as curr doing. Hgb: 11.5 (12/02/2009)   Hct: 35.4 (12/02/2009)   Platelets: 268.0 (12/02/2009) RBC: 3.77 (12/02/2009)   RDW: 14.3 (12/02/2009)   WBC: 7.1 (12/02/2009) MCV: 93.9 (12/02/2009)   MCHC: 32.5 (12/02/2009) Iron: 71 (12/02/2009)   B12: 259 (12/02/2009)   TSH: 4.05 (11/21/2008)  Complete Medication List: 1)  Humalog 100 Unit/ml  Soln (Insulin lispro (human)) .... Four times a day as needed 2)  Metformin Hcl 500 Mg Tabs (Metformin hcl) .Marland Kitchen.. 1 by mouth at bedtime 3)  Actos 45 Mg Tabs (Pioglitazone hcl) .Marland Kitchen.. 1 once daily 4)  Sliding Scale Insulin  5)  Isosorbide Dinitrate 30 Mg Tabs (Isosorbide dinitrate) .Marland Kitchen.. 1 daily by mouth 6)  Benazepril Hcl 10 Mg Tabs (Benazepril hcl) .Marland Kitchen.. 1 daily by mouth 7)  Nitroquick 0.4 Mg Subl (Nitroglycerin) .... As directed 8)  Simvastatin 40 Mg Tabs (Simvastatin) .Marland Kitchen.. 1 tablet by mouth at bedtime 9)  Triamterene-hctz 37.5-25 Mg Caps (Triamterene-hctz) .Marland Kitchen.. 1daily by mouth 10)  Sotalol Hcl (af) 80 Mg Tabs (Sotalol hcl af) .... 1/2 twice a day by mouth 11)  Plavix 75 Mg Tabs (Clopidogrel bisulfate) .Marland Kitchen.. 1 daily by mouth 12)  Cartia Xt 120 Mg Cp24 (Diltiazem hcl coated beads) .Marland Kitchen.. 1 daily by mouth 13)  Glyburide-metformin 5-500 Mg Tabs (Glyburide-metformin) .... 2 tablets am 14)  Onetouch Ultra Test Strp (Glucose blood) .... Test four times a day for sliding scale 15)  Bd Insulin Syringe Ultrafine 31g X 5/16" 0.3 Ml Misc (Insulin syringe-needle  u-100) .... Use daily and as directed sliding scale 16)  Vitamin D 1000 Unit Caps (Cholecalciferol) .Marland Kitchen.. 1 tablet twice a day 17)  Bayer Aspirin Ec Low Dose 81 Mg Tbec (Aspirin) .Marland Kitchen.. 1 daily by mouth 18)  Fish Oil 1200 Mg Caps (Omega-3 fatty acids) .Marland Kitchen.. 1 tablet twice a day by mouth 19)  Chewable Calcium/d 300-100 Mg-unit Chew (Calcium-vitamin d) .... Two times a day  Patient Instructions: 1)  RTC 6 mos.  Current Allergies (reviewed today): ! * TAPE

## 2010-11-04 NOTE — Letter (Signed)
Summary: Nadara Eaton letter  [place] [facility]  [address] [place], [place] [address]   Phone: [phone]  Fax: [phone]       05/08/2010 MRN: [id number]  [facility] [address] [place] [facility] [place], [place]  [address]  Dear Ms. [facility] [place] and [place] announce the retirement of Arta Silence, M.D., from full-time practice at the [facility] office effective April 03, 2010 and his plans of returning part-time.  It is important to Dr. Hetty Ely and to our practice that you understand that [facility] has seven physicians in our office for your health care needs.  We will continue to offer the same exceptional care that you have today.    Dr. Hetty Ely has spoken to many of you about his plans for retirement and returning part-time in the fall.   We will continue to work with you through the transition to schedule appointments for you in the office and meet the high standards that [place] is committed to.   Again, it is with great pleasure that we share the news that Dr. Hetty Ely will return to [facility] in October of 2011 with a reduced schedule.    If you have any questions, or would like to request an appointment with one of our physicians, please call us at [phone] and press the option for Scheduling an appointment.  We take pleasure in providing you with excellent patient care and look forward to seeing you at your next office visit.  Our [facility] Physicians are:  Tillman Abide, M.D. Laurita Quint, M.D. Roxy Manns, M.D. Kerby Nora, M.D. Hannah Beat, M.D. Ruthe Mannan, M.D. We proudly welcomed Raechel Ache, M.D. and Eustaquio Boyden, M.D. to the practice in July/August 2011.  Sincerely,  [facility]

## 2010-11-04 NOTE — Progress Notes (Signed)
Summary: needs order for mammogram  Phone Note Call from Patient Call back at Home Phone [phone]   Caller: Patient Call For: Shaune Leeks MD Summary of Call: Pt needs order for mammogram, she goes to [facility]. Initial call taken by: Lowella Petties CMA,  April 02, 2010 4:42 PM

## 2010-11-04 NOTE — Letter (Signed)
Summary: [facility]   Imported By: Lester [place] 07/03/2010 11:22:28  _____________________________________________________________________  External Attachment:    Type:   Image     Comment:   External Document  Appended Document: [facility]    Clinical Lists Changes  Observations: Added new observation of PAST MED HX: Anemia-iron deficiency Atrial fibrillation Breast cancer, hx of, followed yearly by Dr. Daphine Deutscher with [facility] Coronary artery disease Diabetes mellitus, type II Hyperlipidemia Hypertension Osteoporosis Peripheral vascular disease  (07/03/2010 22:29)       Past History:  Past Medical History: Anemia-iron deficiency Atrial fibrillation Breast cancer, hx of, followed yearly by Dr. Daphine Deutscher with [facility] Coronary artery disease Diabetes mellitus, type II Hyperlipidemia Hypertension Osteoporosis Peripheral vascular disease

## 2010-11-04 NOTE — Letter (Signed)
Summary: [facility]  [facility]   Imported By: Maryln Gottron 08/11/2010 13:37:38  _____________________________________________________________________  External Attachment:    Type:   Image     Comment:   External Document

## 2010-11-04 NOTE — Assessment & Plan Note (Signed)
Summary: COUGH/CLE   Vital Signs:  Patient profile:   76 year old female Weight:      156.50 pounds Temp:     98.3 degrees F oral Pulse rate:   76 / minute Pulse rhythm:   regular BP sitting:   130 / 64  (left arm) Cuff size:   large  Vitals Entered By: Sydell Axon LPN (March 25, 2010 8:53 AM) CC: Productive cough/yellow-green, Lipid Management   History of Present Illness: Pt here for coughing since last Thu with production of green/yellow mucous. She is swallowing some of the other. She was seen by Dr Benard Halsted and told her lungs were clear, may have begun that night. She has no fever but gets cold, no headache except with coughing hard, minimal ear pain bilat, some runny nose of clear mucous, Coughing real hard produces dark yellowish brown mucous, no ST, no SOB unless coughing a loit...has ticklish throat and sleeping in altrnate bedroom to avoid bothering her husband, no N/v except once with severe coughing episode. She has taken nothing. She just had stenting done, this has not bothered her previous, recent  rib fracture.  Lipid Management History:      Positive NCEP/ATP III risk factors include female age 49 years old or older, diabetes, hypertension, ASHD (either angina/prior MI/prior CABG), and peripheral vascular disease.  Negative NCEP/ATP III risk factors include non-tobacco-user status.     Problems Prior to Update: 1)  Unspecified Vitamin D Deficiency  (ICD-268.9) 2)  Carotid Bruit  (ICD-785.9) 3)  Special Screening Malig Neoplasms Other Sites  (ICD-V76.49) 4)  Peripheral Vascular Disease  (ICD-443.9) 5)  Osteoporosis  (ICD-733.00) 6)  Hypertension  (ICD-401.9) 7)  Hyperlipidemia  (ICD-272.4) 8)  Diabetes Mellitus, Type II  (ICD-250.00) 9)  Coronary Artery Disease  (ICD-414.00) 10)  Breast Cancer, Hx Of, Right Breast  (ICD-V10.3) 11)  Atrial Fibrillation  (ICD-427.31) 12)  Anemia-iron Deficiency  (ICD-280.9)  Medications Prior to Update: 1)  Humalog 100 Unit/ml  Soln (Insulin Lispro (Human)) .... Four Times A Day As Needed 2)  Metformin Hcl 500 Mg Tabs (Metformin Hcl) .Marland Kitchen.. 1 By Mouth At Bedtime 3)  Actos 45 Mg Tabs (Pioglitazone Hcl) .Marland Kitchen.. 1 Once Daily 4)  Sliding Scale Insulin 5)  Isosorbide Dinitrate 30 Mg  Tabs (Isosorbide Dinitrate) .Marland Kitchen.. 1 Daily By Mouth 6)  Benazepril Hcl 10 Mg  Tabs (Benazepril Hcl) .Marland Kitchen.. 1 Daily By Mouth 7)  Nitroquick 0.4 Mg  Subl (Nitroglycerin) .... As Directed 8)  Simvastatin 40 Mg  Tabs (Simvastatin) .Marland Kitchen.. 1 Tablet By Mouth At Bedtime 9)  Triamterene-Hctz 37.5-25 Mg  Caps (Triamterene-Hctz) .Marland Kitchen.. 1daily By Mouth 10)  Sotalol Hcl (Af) 80 Mg  Tabs (Sotalol Hcl Af) .... 1/2 Twice A Day By Mouth 11)  Plavix 75 Mg  Tabs (Clopidogrel Bisulfate) .Marland Kitchen.. 1 Daily By Mouth 12)  Cartia Xt 120 Mg  Cp24 (Diltiazem Hcl Coated Beads) .Marland Kitchen.. 1 Daily By Mouth 13)  Glyburide-Metformin 5-500 Mg  Tabs (Glyburide-Metformin) .... 2 Tablets Am 14)  Onetouch Ultra Test   Strp (Glucose Blood) .... Test Four Times A Day For Sliding Scale 15)  Bd Insulin Syringe Ultrafine 31g X 5/16" 0.3 Ml  Misc (Insulin Syringe-Needle U-100) .... Use Daily and As Directed Sliding Scale 16)  Vitamin D 1000 Unit Caps (Cholecalciferol) .Marland Kitchen.. 1 Tablet Twice A Day 17)  Bayer Aspirin Ec Low Dose 81 Mg Tbec (Aspirin) .Marland Kitchen.. 1 Daily By Mouth 18)  Fish Oil 1200 Mg Caps (Omega-3 Fatty Acids) .Marland Kitchen.. 1 Tablet  Twice A Day By Mouth 19)  Chewable Calcium/d 300-100 Mg-Unit Chew (Calcium-Vitamin D) .... Two Times A Day  Allergies: 1)  ! * Tape  Physical Exam  General:  Well-developed,well-nourished,in no acute distress except for fairly regular coughing...esp with talking.;  Alert,appropriate and cooperative throughout examination Head:  Normocephalic and atraumatic without obvious abnormalities. No apparent alopecia or balding. Sinuses nontender. Eyes:  Conjunctiva clear bilaterally. PERRL,  EOMI. Ears:  External ear exam shows no significant lesions or deformities.  Otoscopic  examination reveals clear canals, tympanic membranes are intact bilaterally without bulging, retraction, inflammation or discharge. Hearing is grossly normal bilaterally. Cerumen bilat. Nose:  External nasal examination shows no deformity or inflammation. Nasal mucosa are pink and moist without lesions but clear  exudates. Mouth:  Oral mucosa and oropharynx without lesions or exudates.  Teeth in good repair. Clear, thick PND. Neck:  No deformities, masses, or tenderness noted. Lungs:  Ronchi that don't quite clear in the LLL, R side clear.  Heart:   S1 and S2 normal without gallop, murmur, click, rub or other extra sounds. Carotid with faint bruit on left.   Impression & Recommendations:  Problem # 1:  BRONCHITIS- ACUTE (ICD-466.0) Assessment New Poss early poneumonuia. See instructions. Her updated medication list for this problem includes:    Zithromax Z-pak 250 Mg Tabs (Azithromycin) .Marland Kitchen... As dir    Tessalon Perles 100 Mg Caps (Benzonatate) ..... One tab by mouth three times a day  Complete Medication List: 1)  Humalog 100 Unit/ml Soln (Insulin lispro (human)) .... Four times a day as needed 2)  Metformin Hcl 500 Mg Tabs (Metformin hcl) .Marland Kitchen.. 1 by mouth at bedtime 3)  Actos 45 Mg Tabs (Pioglitazone hcl) .Marland Kitchen.. 1 once daily 4)  Sliding Scale Insulin  5)  Isosorbide Dinitrate 30 Mg Tabs (Isosorbide dinitrate) .Marland Kitchen.. 1 daily by mouth 6)  Benazepril Hcl 10 Mg Tabs (Benazepril hcl) .Marland Kitchen.. 1 daily by mouth 7)  Nitroquick 0.4 Mg Subl (Nitroglycerin) .... As directed 8)  Simvastatin 40 Mg Tabs (Simvastatin) .Marland Kitchen.. 1 tablet by mouth at bedtime 9)  Triamterene-hctz 37.5-25 Mg Caps (Triamterene-hctz) .Marland Kitchen.. 1daily by mouth 10)  Sotalol Hcl (af) 80 Mg Tabs (Sotalol hcl af) .... 1/2 twice a day by mouth 11)  Plavix 75 Mg Tabs (Clopidogrel bisulfate) .Marland Kitchen.. 1 daily by mouth 12)  Glyburide-metformin 5-500 Mg Tabs (Glyburide-metformin) .... 2 tablets am 13)  Onetouch Ultra Test Strp (Glucose blood) .... Test  four times a day for sliding scale 14)  Bd Insulin Syringe Ultrafine 31g X 5/16" 0.3 Ml Misc (Insulin syringe-needle u-100) .... Use daily and as directed sliding scale 15)  Vitamin D 1000 Unit Caps (Cholecalciferol) .Marland Kitchen.. 1 tablet twice a day 16)  Bayer Aspirin Ec Low Dose 81 Mg Tbec (Aspirin) .Marland Kitchen.. 1 daily by mouth 17)  Fish Oil 1200 Mg Caps (Omega-3 fatty acids) .Marland Kitchen.. 1 tablet twice a day by mouth 18)  Chewable Calcium/d 300-100 Mg-unit Chew (Calcium-vitamin d) .... Two times a day 19)  Diltiazem Hcl 90 Mg Tabs (Diltiazem hcl) .... Take one by mouth daily 20)  Zithromax Z-pak 250 Mg Tabs (Azithromycin) .... As dir 21)  Tessalon Perles 100 Mg Caps (Benzonatate) .... One tab by mouth three times a day  Lipid Assessment/Plan:      Based on NCEP/ATP III, the patient's risk factor category is "history of coronary disease, peripheral vascular disease, cerebrovascular disease, or aortic aneurysm along with either diabetes, current smoker, or LDL > 130 plus HDL < 40 plus triglycerides >  200".  The patient's lipid goals are as follows: Total cholesterol goal is 200; LDL cholesterol goal is 70; HDL cholesterol goal is 40; Triglyceride goal is 150.    Patient Instructions: 1)  Take Zithro 2)  Take Tessalon three times a day as needed for cough as needed. 3)  Take Guaifenesin by [facility] or [facility] and getting MUCOUS RELIEF EXPECTORANT (400mg ), take 11/2 tabs by mouth AM and NOON. 4)  Drink lots of fluids anytime taking Guaifenesin.  5)  Keep lozenge in mouth. 6)  Call or RTC if sxs worsen. Prescriptions: TESSALON PERLES 100 MG CAPS (BENZONATATE) one tab by mouth three times a day  #30 x 1   Entered and Authorized by:   Shaune Leeks MD   Signed by:   Shaune Leeks MD on 03/25/2010   Method used:   [facility] to        [facility]  [address] (retail)       [address]       [place], [place]  [address]       Ph: [phone] or [phone]        Fax: [phone]   RxID:   [id number] ZITHROMAX Z-PAK 250 MG TABS (AZITHROMYCIN) as dir  #1 pak x 0   Entered and Authorized by:   Shaune Leeks MD   Signed by:   Shaune Leeks MD on 03/25/2010   Method used:   [facility]        [facility]  [address] (retail)       [address]       [place], [place]  [address]       Ph: [phone] or [phone]       Fax: [phone]   RxID:   [id number]   Current Allergies (reviewed today): ! * TAPE

## 2010-11-04 NOTE — Assessment & Plan Note (Signed)
Summary: F/U/CLE   Vital Signs:  Patient profile:   76 year old female Weight:      158 pounds BMI:     26.39 Temp:     97.4 degrees F oral Pulse rate:   64 / minute Pulse rhythm:   regular BP sitting:   122 / 74  (left arm) Cuff size:   large  Vitals Entered By: Sydell Axon LPN (July 24, 2010 9:09 AM) CC: follow-up visit   History of Present Illness: Pt here for 6 month followup. She had Cardiology appt yesterday and was taken off Simvastatin to see if helps tiredness of legs. We discussed Actos and changing to Lantus or Levemir. She is doing reasonably well on Actos and Humalog. She has had some lows and has been trying to establish insulin needs. She was not told when to come back for chol followup.  She feels well today with no complaints.  She would like a flu shot.  Problems Prior to Update: 1)  Unspecified Vitamin D Deficiency  (ICD-268.9) 2)  Carotid Bruit  (ICD-785.9) 3)  Special Screening Malig Neoplasms Other Sites  (ICD-V76.49) 4)  Peripheral Vascular Disease  (ICD-443.9) 5)  Osteoporosis  (ICD-733.00) 6)  Hypertension  (ICD-401.9) 7)  Hyperlipidemia  (ICD-272.4) 8)  Diabetes Mellitus, Type II  (ICD-250.00) 9)  Coronary Artery Disease  (ICD-414.00) 10)  Breast Cancer, Hx Of, Right Breast  (ICD-V10.3) 11)  Atrial Fibrillation  (ICD-427.31) 12)  Anemia-iron Deficiency  (ICD-280.9)  Medications Prior to Update: 1)  Humalog 100 Unit/ml Soln (Insulin Lispro (Human)) .... Four Times A Day As Needed 2)  Metformin Hcl 500 Mg Tabs (Metformin Hcl) .Marland Kitchen.. 1 By Mouth At Bedtime 3)  Actos 45 Mg Tabs (Pioglitazone Hcl) .Marland Kitchen.. 1 Once Daily 4)  Sliding Scale Insulin 5)  Isosorbide Dinitrate 30 Mg  Tabs (Isosorbide Dinitrate) .Marland Kitchen.. 1 Daily By Mouth 6)  Benazepril Hcl 10 Mg  Tabs (Benazepril Hcl) .Marland Kitchen.. 1 Daily By Mouth 7)  Nitroquick 0.4 Mg  Subl (Nitroglycerin) .... As Directed 8)  Simvastatin 40 Mg  Tabs (Simvastatin) .Marland Kitchen.. 1 Tablet By Mouth At Bedtime 9)  Triamterene-Hctz  37.5-25 Mg  Caps (Triamterene-Hctz) .Marland Kitchen.. 1daily By Mouth 10)  Sotalol Hcl (Af) 80 Mg  Tabs (Sotalol Hcl Af) .... 1/2 Twice A Day By Mouth 11)  Plavix 75 Mg  Tabs (Clopidogrel Bisulfate) .Marland Kitchen.. 1 Daily By Mouth 12)  Glyburide-Metformin 5-500 Mg  Tabs (Glyburide-Metformin) .... 2 Tablets Am 13)  Onetouch Ultra Test   Strp (Glucose Blood) .... Test Four Times A Day For Sliding Scale 14)  Bd Insulin Syringe Ultrafine 31g X 5/16" 0.3 Ml  Misc (Insulin Syringe-Needle U-100) .... Use Daily and As Directed Sliding Scale 15)  Vitamin D 1000 Unit Caps (Cholecalciferol) .Marland Kitchen.. 1 Tablet Twice A Day 16)  Bayer Aspirin Ec Low Dose 81 Mg Tbec (Aspirin) .Marland Kitchen.. 1 Daily By Mouth 17)  Fish Oil 1200 Mg Caps (Omega-3 Fatty Acids) .Marland Kitchen.. 1 Tablet Twice A Day By Mouth 18)  Chewable Calcium/d 300-100 Mg-Unit Chew (Calcium-Vitamin D) .... Two Times A Day 19)  Diltiazem Hcl 90 Mg Tabs (Diltiazem Hcl) .... Take One By Mouth Daily 20)  Zithromax Z-Pak 250 Mg Tabs (Azithromycin) .... As Dir 21)  Tessalon Perles 100 Mg Caps (Benzonatate) .... One Tab By Mouth Three Times A Day  Allergies: 1)  ! * Tape  Physical Exam  General:  Well-developed,well-nourished,in no acute distress except for fairly regular coughing...esp with talking.;  Alert,appropriate and cooperative throughout examination Head:  Normocephalic and atraumatic without obvious abnormalities. No apparent alopecia or balding. Sinuses nontender. Eyes:  Conjunctiva clear bilaterally. Ears:  External ear exam shows no significant lesions or deformities.  Otoscopic examination reveals clear canals, tympanic membranes are intact bilaterally without bulging, retraction, inflammation or discharge. Hearing is grossly normal bilaterally. Cerumen bilat. Nose:  External nasal examination shows no deformity or inflammation. Nasal mucosa are pink and moist without lesions but clear  exudates. Mouth:  Oral mucosa and oropharynx without lesions or exudates.  Teeth in good repair.  Clear, thick PND. Neck:  No deformities, masses, or tenderness noted. Lungs:  Ronchi that don't quite clear in the LLL, R side clear.  Heart:   S1 and S2 normal without gallop, murmur, click, rub or other extra sounds. Carotid with faint bruit on left.   Impression & Recommendations:  Problem # 1:  UNSPECIFIED VITAMIN D DEFICIENCY (ICD-268.9) Assessment Improved Therapeutic on 1000Iu two times a day. Continue.  Problem # 2:  HYPERTENSION (ICD-401.9) Assessment: Unchanged Stable, cont curr therapy. Her updated medication list for this problem includes:    Benazepril Hcl 10 Mg Tabs (Benazepril hcl) .Marland Kitchen... 1 daily by mouth    Triamterene-hctz 37.5-25 Mg Caps (Triamterene-hctz) .Marland Kitchen... 1daily by mouth    Sotalol Hcl (af) 80 Mg Tabs (Sotalol hcl af) .Marland Kitchen... 1/2 twice a day by mouth    Diltiazem Hcl 90 Mg Tabs (Diltiazem hcl) .Marland Kitchen... Take one by mouth daily  BP today: 122/74 Prior BP: 130/64 (03/25/2010)  Prior 10 Yr Risk Heart Disease: N/A (03/25/2010)  Labs Reviewed: K+: 4.8 (07/21/2010) Creat: : 1.1 (07/21/2010)   Chol: 152 (07/21/2010)   HDL: 37.50 (07/21/2010)   LDL: 88 (07/21/2010)   TG: 133.0 (07/21/2010)  Problem # 3:  HYPERLIPIDEMIA (ICD-272.4) Assessment: Unchanged  Good nos but now off med....not sure yet if she will stay off.  Will leave on med list until sure she will not take it. Her updated medication list for this problem includes:    Simvastatin 40 Mg Tabs (Simvastatin) .Marland Kitchen... 1 tablet by mouth at bedtime holding  Labs Reviewed: SGOT: 16 (07/21/2010)   SGPT: 9 (07/21/2010)  Lipid Goals: Chol Goal: 200 (03/25/2010)   HDL Goal: 40 (03/25/2010)   LDL Goal: 70 (03/25/2010)   TG Goal: 150 (03/25/2010)  Prior 10 Yr Risk Heart Disease: N/A (03/25/2010)   HDL:37.50 (07/21/2010), 47.90 (12/02/2009)  LDL:88 (07/21/2010), 72 (12/02/2009)  Chol:152 (07/21/2010), 149 (12/02/2009)  Trig:133.0 (07/21/2010), 147.0 (12/02/2009)  Problem # 4:  DIABETES MELLITUS, TYPE II  (ICD-250.00) Assessment: Unchanged Stable. Discussed at length Actos, Lantus and Levemir possibilities, etc. Long discussion. Also discussed Humalog and checking sugars. Her updated medication list for this problem includes:    Humalog 100 Unit/ml Soln (Insulin lispro (human)) .Marland Kitchen... Four times a day as needed    Metformin Hcl 500 Mg Tabs (Metformin hcl) .Marland Kitchen... 1 by mouth at bedtime    Actos 45 Mg Tabs (Pioglitazone hcl) .Marland Kitchen... 1 once daily    Benazepril Hcl 10 Mg Tabs (Benazepril hcl) .Marland Kitchen... 1 daily by mouth    Glyburide-metformin 5-500 Mg Tabs (Glyburide-metformin) .Marland Kitchen... 2 tablets am    Bayer Aspirin Ec Low Dose 81 Mg Tbec (Aspirin) .Marland Kitchen... 1 daily by mouth  Labs Reviewed: Creat: 1.1 (07/21/2010)   Microalbumin: 24.5 (10/05/2006) Reviewed HgBA1c results: 7.4 (07/21/2010)  7.2 (12/02/2009)  Problem # 5:  ANEMIA-IRON DEFICIENCY (ICD-280.9) Assessment: Unchanged Still anemic. Discussed. Her updated medication list for this problem includes:    Vitamin B-12 1000 Mcg Tabs (Cyanocobalamin) .Marland Kitchen... Take one  by mouth daily  Hgb: 11.0 (07/21/2010)   Hct: 32.9 (07/21/2010)   Platelets: 258.0 (07/21/2010) RBC: 3.58 (07/21/2010)   RDW: 16.0 (07/21/2010)   WBC: 7.1 (07/21/2010) MCV: 91.9 (07/21/2010)   MCHC: 33.4 (07/21/2010) Iron: 71 (12/02/2009)   B12: 259 (12/02/2009)   TSH: 3.06 (07/21/2010)  Complete Medication List: 1)  Humalog 100 Unit/ml Soln (Insulin lispro (human)) .... Four times a day as needed 2)  Metformin Hcl 500 Mg Tabs (Metformin hcl) .Marland Kitchen.. 1 by mouth at bedtime 3)  Actos 45 Mg Tabs (Pioglitazone hcl) .Marland Kitchen.. 1 once daily 4)  Sliding Scale Insulin  5)  Isosorbide Dinitrate 30 Mg Tabs (Isosorbide dinitrate) .Marland Kitchen.. 1 daily by mouth 6)  Benazepril Hcl 10 Mg Tabs (Benazepril hcl) .Marland Kitchen.. 1 daily by mouth 7)  Nitroquick 0.4 Mg Subl (Nitroglycerin) .... As directed 8)  Simvastatin 40 Mg Tabs (Simvastatin) .Marland Kitchen.. 1 tablet by mouth at bedtime holding 9)  Triamterene-hctz 37.5-25 Mg Caps  (Triamterene-hctz) .Marland Kitchen.. 1daily by mouth 10)  Sotalol Hcl (af) 80 Mg Tabs (Sotalol hcl af) .... 1/2 twice a day by mouth 11)  Plavix 75 Mg Tabs (Clopidogrel bisulfate) .Marland Kitchen.. 1 daily by mouth 12)  Glyburide-metformin 5-500 Mg Tabs (Glyburide-metformin) .... 2 tablets am 13)  Onetouch Ultra Test Strp (Glucose blood) .... Test four times a day for sliding scale 14)  Bd Insulin Syringe Ultrafine 31g X 5/16" 0.3 Ml Misc (Insulin syringe-needle u-100) .... Use daily and as directed sliding scale 15)  Vitamin D 1000 Unit Caps (Cholecalciferol) .Marland Kitchen.. 1 tablet twice a day 16)  Bayer Aspirin Ec Low Dose 81 Mg Tbec (Aspirin) .Marland Kitchen.. 1 daily by mouth 17)  Fish Oil 1200 Mg Caps (Omega-3 fatty acids) .Marland Kitchen.. 1 tablet by mouth daily 18)  Diltiazem Hcl 90 Mg Tabs (Diltiazem hcl) .... Take one by mouth daily 19)  Vitamin B-12 1000 Mcg Tabs (Cyanocobalamin) .... Take one by mouth daily  Patient Instructions: 1)  RTC 6 mos for Comp Exam, labs prior. 2)  Come in sooner if decides wants to stop Actos.   Orders Added: 1)  Est. Patient Level IV [03474]    Current Allergies (reviewed today): ! * TAPE  Appended Document: F/U/CLE    Clinical Lists Changes  Observations: Added new observation of MAMMO DUE: 10/2010 (07/24/2010 10:31) Added new observation of CHIEF CMPLNT: Preventive Care (07/24/2010 10:31) Added new observation of MAMMOGRAM: Normal Left, Abnormal Right (04/16/2010 10:33)       PAP Screening:    Last PAP smear:  11/28/2008  Mammogram Screening:    Last Mammogram:  04/16/2010  Mammogram Results:    Date of Exam:  04/16/2010    Results:  Normal Left, Abnormal Right  Mammogram Comments:    extra calcifications, f/u 10/21/2010, 1030  Next Mammogram Due:    10/17/2010  Osteoporosis Risk Assessment:  Risk Factors for Fracture or Low Bone Density:   Race (White or Asian):     yes   Smoking status:       never  Immunization & Chemoprophylaxis:    Tetanus vaccine: Td  (01/10/2001)     Influenza vaccine: Fluvax 3+  (07/02/2008)    Pneumovax: Pneumovax  (11/02/2005)

## 2010-11-04 NOTE — Letter (Signed)
Summary: [facility]  [facility]   Imported By: Lanelle Bal 10/08/2009 11:56:47  _____________________________________________________________________  External Attachment:    Type:   Image     Comment:   External Document

## 2010-11-04 NOTE — Letter (Signed)
Summary: Results Follow up Letter  [place] [facility]  [address] [place], [place] [address]   Phone: [phone]  Fax: [phone]    12/23/2009 MRN: [id number]    [facility] [address] [place], [place]  [address]    Dear Ms. Verrastro,  The following are the results of your recent test(s):  Test         Result    Pap Smear:        Normal _____  Not Normal _____ Comments: ______________________________________________________ Cholesterol: LDL(Bad cholesterol):         Your goal is less than:         HDL (Good cholesterol):       Your goal is more than: Comments:  ______________________________________________________ Mammogram:        Normal _____  Not Normal _____ Comments:  ___________________________________________________________________ Hemoccult:        Normal __X___  Not normal _______ Comments:    Yearly follow up is recommended.   _____________________________________________________________________ Other Tests:    We routinely do not discuss normal results over the telephone.  If you desire a copy of the results, or you have any questions about this information we can discuss them at your next office visit.   Sincerely,    Arta Silence, M.D.  RNS:lsf

## 2010-11-04 NOTE — Progress Notes (Signed)
----   Converted from flag ---- ---- 07/16/2010 1:14 PM, Shaune Leeks MD wrote: bmet 401.9 hepatic chol prof tsh 272.4 cbc 414.00 a1c microalb 250.00 vit d 268.9  ---- 07/16/2010 12:01 PM, Liane Comber CMA (AAMA) wrote: Lab orders please! Good Morning! This pt is scheduled for f/u labs Monday, which labs to draw and dx codes to use? Thanks [name] ------------------------------

## 2010-11-04 NOTE — Letter (Signed)
Summary: [facility]  [facility]   Imported By: Lanelle Bal 08/05/2010 15:37:51  _____________________________________________________________________  External Attachment:    Type:   Image     Comment:   External Document

## 2010-11-06 NOTE — Letter (Signed)
Summary: [facility]  [facility]   Imported By: Lanelle Bal 10/23/2010 08:30:43  _____________________________________________________________________  External Attachment:    Type:   Image     Comment:   External Document

## 2010-11-18 ENCOUNTER — Encounter: Payer: Self-pay | Admitting: Family Medicine

## 2010-11-27 ENCOUNTER — Ambulatory Visit (INDEPENDENT_AMBULATORY_CARE_PROVIDER_SITE_OTHER)
Admission: RE | Admit: 2010-11-27 | Discharge: 2010-11-27 | Disposition: A | Discharge status: Home / Self Care | Payer: Medicare Other | Source: Ambulatory Visit | Attending: Family Medicine | Admitting: Family Medicine

## 2010-11-27 ENCOUNTER — Other Ambulatory Visit: Payer: Self-pay | Admitting: Family Medicine

## 2010-11-27 ENCOUNTER — Encounter (HOSPITAL_COMMUNITY): Payer: Self-pay | Admitting: Radiology

## 2010-11-27 ENCOUNTER — Encounter: Payer: Self-pay | Admitting: Family Medicine

## 2010-11-27 ENCOUNTER — Ambulatory Visit (INDEPENDENT_AMBULATORY_CARE_PROVIDER_SITE_OTHER): Payer: Medicare Other | Admitting: Family Medicine

## 2010-11-27 ENCOUNTER — Other Ambulatory Visit: Payer: Self-pay | Admitting: General Surgery

## 2010-11-27 ENCOUNTER — Emergency Department (HOSPITAL_COMMUNITY): Payer: Medicare Other

## 2010-11-27 ENCOUNTER — Inpatient Hospital Stay (HOSPITAL_COMMUNITY)
Admission: EM | Admit: 2010-11-27 | Discharge: 2010-12-06 | DRG: 329 | Disposition: A | Discharge status: Home Health | Payer: Medicare Other | Attending: Surgery | Admitting: Surgery

## 2010-11-27 DIAGNOSIS — R1033 Periumbilical pain: Secondary | ICD-10-CM

## 2010-11-27 DIAGNOSIS — E785 Hyperlipidemia, unspecified: Secondary | ICD-10-CM | POA: Diagnosis present

## 2010-11-27 DIAGNOSIS — J95821 Acute postprocedural respiratory failure: Secondary | ICD-10-CM | POA: Diagnosis not present

## 2010-11-27 DIAGNOSIS — I25810 Atherosclerosis of coronary artery bypass graft(s) without angina pectoris: Secondary | ICD-10-CM | POA: Diagnosis present

## 2010-11-27 DIAGNOSIS — Z9861 Coronary angioplasty status: Secondary | ICD-10-CM

## 2010-11-27 DIAGNOSIS — K560 Paralytic ileus: Secondary | ICD-10-CM | POA: Diagnosis not present

## 2010-11-27 DIAGNOSIS — E7800 Pure hypercholesterolemia, unspecified: Secondary | ICD-10-CM | POA: Diagnosis present

## 2010-11-27 DIAGNOSIS — Z794 Long term (current) use of insulin: Secondary | ICD-10-CM

## 2010-11-27 DIAGNOSIS — Z853 Personal history of malignant neoplasm of breast: Secondary | ICD-10-CM

## 2010-11-27 DIAGNOSIS — N179 Acute kidney failure, unspecified: Secondary | ICD-10-CM | POA: Diagnosis not present

## 2010-11-27 DIAGNOSIS — K650 Generalized (acute) peritonitis: Secondary | ICD-10-CM | POA: Diagnosis present

## 2010-11-27 DIAGNOSIS — I214 Non-ST elevation (NSTEMI) myocardial infarction: Secondary | ICD-10-CM | POA: Diagnosis not present

## 2010-11-27 DIAGNOSIS — Y833 Surgical operation with formation of external stoma as the cause of abnormal reaction of the patient, or of later complication, without mention of misadventure at the time of the procedure: Secondary | ICD-10-CM | POA: Diagnosis not present

## 2010-11-27 DIAGNOSIS — I2510 Atherosclerotic heart disease of native coronary artery without angina pectoris: Secondary | ICD-10-CM | POA: Diagnosis present

## 2010-11-27 DIAGNOSIS — R5381 Other malaise: Secondary | ICD-10-CM | POA: Diagnosis not present

## 2010-11-27 DIAGNOSIS — Z7902 Long term (current) use of antithrombotics/antiplatelets: Secondary | ICD-10-CM

## 2010-11-27 DIAGNOSIS — I519 Heart disease, unspecified: Secondary | ICD-10-CM | POA: Diagnosis not present

## 2010-11-27 DIAGNOSIS — I10 Essential (primary) hypertension: Secondary | ICD-10-CM | POA: Diagnosis present

## 2010-11-27 DIAGNOSIS — N390 Urinary tract infection, site not specified: Secondary | ICD-10-CM | POA: Diagnosis not present

## 2010-11-27 DIAGNOSIS — I4891 Unspecified atrial fibrillation: Secondary | ICD-10-CM | POA: Diagnosis not present

## 2010-11-27 DIAGNOSIS — E876 Hypokalemia: Secondary | ICD-10-CM | POA: Diagnosis not present

## 2010-11-27 DIAGNOSIS — K5732 Diverticulitis of large intestine without perforation or abscess without bleeding: Principal | ICD-10-CM | POA: Diagnosis present

## 2010-11-27 DIAGNOSIS — R0902 Hypoxemia: Secondary | ICD-10-CM | POA: Diagnosis not present

## 2010-11-27 DIAGNOSIS — D62 Acute posthemorrhagic anemia: Secondary | ICD-10-CM | POA: Diagnosis not present

## 2010-11-27 DIAGNOSIS — Z923 Personal history of irradiation: Secondary | ICD-10-CM

## 2010-11-27 DIAGNOSIS — K630 Abscess of intestine: Secondary | ICD-10-CM | POA: Diagnosis present

## 2010-11-27 DIAGNOSIS — Z7982 Long term (current) use of aspirin: Secondary | ICD-10-CM

## 2010-11-27 DIAGNOSIS — E1169 Type 2 diabetes mellitus with other specified complication: Secondary | ICD-10-CM | POA: Diagnosis present

## 2010-11-27 DIAGNOSIS — Y9210 Unspecified residential institution as the place of occurrence of the external cause: Secondary | ICD-10-CM | POA: Diagnosis not present

## 2010-11-27 DIAGNOSIS — I2582 Chronic total occlusion of coronary artery: Secondary | ICD-10-CM | POA: Diagnosis present

## 2010-11-27 DIAGNOSIS — K929 Disease of digestive system, unspecified: Secondary | ICD-10-CM | POA: Diagnosis not present

## 2010-11-27 LAB — DIFFERENTIAL
Basophils Absolute: 0 10*3/uL (ref 0.0–0.1)
Basophils Relative: 0 % (ref 0–1)
Eosinophils Absolute: 0.1 10*3/uL (ref 0.0–0.7)
Eosinophils Relative: 0 % (ref 0–5)
Lymphocytes Relative: 11 % — ABNORMAL LOW (ref 12–46)
Lymphs Abs: 1.5 10*3/uL (ref 0.7–4.0)
Monocytes Absolute: 1.1 10*3/uL — ABNORMAL HIGH (ref 0.1–1.0)
Monocytes Relative: 7 % (ref 3–12)
Neutro Abs: 11.8 10*3/uL — ABNORMAL HIGH (ref 1.7–7.7)
Neutrophils Relative %: 82 % — ABNORMAL HIGH (ref 43–77)

## 2010-11-27 LAB — CBC
HCT: 33.2 % — ABNORMAL LOW (ref 36.0–46.0)
Hemoglobin: 10.9 g/dL — ABNORMAL LOW (ref 12.0–15.0)
MCH: 29.8 pg (ref 26.0–34.0)
MCHC: 32.8 g/dL (ref 30.0–36.0)
MCV: 90.7 fL (ref 78.0–100.0)
Platelets: 301 10*3/uL (ref 150–400)
RBC: 3.66 MIL/uL — ABNORMAL LOW (ref 3.87–5.11)
RDW: 15.4 % (ref 11.5–15.5)
WBC: 14.5 10*3/uL — ABNORMAL HIGH (ref 4.0–10.5)

## 2010-11-27 LAB — CONVERTED CEMR LAB
Bilirubin Urine: NEGATIVE
Glucose, Urine, Semiquant: NEGATIVE
Ketones, urine, test strip: NEGATIVE
Nitrite: NEGATIVE
Specific Gravity, Urine: 1.01
Urobilinogen, UA: 0.2
pH: 7

## 2010-11-27 LAB — URINALYSIS, ROUTINE W REFLEX MICROSCOPIC
Bilirubin Urine: NEGATIVE
Hgb urine dipstick: NEGATIVE
Ketones, ur: NEGATIVE mg/dL
Nitrite: NEGATIVE
Protein, ur: NEGATIVE mg/dL
Specific Gravity, Urine: 1.01 (ref 1.005–1.030)
Urine Glucose, Fasting: NEGATIVE mg/dL
Urobilinogen, UA: 0.2 mg/dL (ref 0.0–1.0)

## 2010-11-27 LAB — COMPREHENSIVE METABOLIC PANEL
ALT: 11 U/L (ref 0–35)
AST: 18 U/L (ref 0–37)
Albumin: 3.5 g/dL (ref 3.5–5.2)
Alkaline Phosphatase: 41 U/L (ref 39–117)
BUN: 27 mg/dL — ABNORMAL HIGH (ref 6–23)
Calcium: 9.3 mg/dL (ref 8.4–10.5)
GFR calc Af Amer: 52 mL/min — ABNORMAL LOW (ref >60)
GFR calc non Af Amer: 43 mL/min — ABNORMAL LOW (ref >60)
Glucose, Bld: 57 mg/dL — ABNORMAL LOW (ref 70–99)
Potassium: 4.4 mEq/L (ref 3.5–5.1)
Total Bilirubin: 0.9 mg/dL (ref 0.3–1.2)
Total Protein: 6.4 g/dL (ref 6.0–8.3)

## 2010-11-27 LAB — LACTIC ACID, PLASMA: Lactic Acid, Venous: 1.8 mmol/L (ref 0.5–2.2)

## 2010-11-27 LAB — GLUCOSE, CAPILLARY

## 2010-11-27 LAB — URINE MICROSCOPIC-ADD ON

## 2010-11-27 LAB — LIPASE, BLOOD: Lipase: 24 U/L (ref 11–59)

## 2010-11-27 MED ORDER — IOHEXOL 300 MG/ML  SOLN
100.0000 mL | Freq: Once | INTRAMUSCULAR | Status: AC | PRN
  Administered 2010-11-27: 100 mL via INTRAVENOUS

## 2010-11-28 ENCOUNTER — Inpatient Hospital Stay (HOSPITAL_COMMUNITY): Payer: Medicare Other

## 2010-11-28 DIAGNOSIS — I200 Unstable angina: Secondary | ICD-10-CM

## 2010-11-28 DIAGNOSIS — I4891 Unspecified atrial fibrillation: Secondary | ICD-10-CM

## 2010-11-28 DIAGNOSIS — J9600 Acute respiratory failure, unspecified whether with hypoxia or hypercapnia: Secondary | ICD-10-CM

## 2010-11-28 DIAGNOSIS — N179 Acute kidney failure, unspecified: Secondary | ICD-10-CM

## 2010-11-28 HISTORY — DX: Unspecified atrial fibrillation: I48.91

## 2010-11-28 LAB — DIFFERENTIAL
Basophils Absolute: 0 10*3/uL (ref 0.0–0.1)
Basophils Relative: 0 % (ref 0–1)
Eosinophils Absolute: 0 10*3/uL (ref 0.0–0.7)
Eosinophils Relative: 0 % (ref 0–5)
Lymphocytes Relative: 9 % — ABNORMAL LOW (ref 12–46)
Lymphs Abs: 1.4 10*3/uL (ref 0.7–4.0)
Monocytes Absolute: 1.4 10*3/uL — ABNORMAL HIGH (ref 0.1–1.0)
Monocytes Relative: 9 % (ref 3–12)
Neutro Abs: 12.2 10*3/uL — ABNORMAL HIGH (ref 1.7–7.7)
Neutrophils Relative %: 81 % — ABNORMAL HIGH (ref 43–77)

## 2010-11-28 LAB — BASIC METABOLIC PANEL
BUN: 27 mg/dL — ABNORMAL HIGH (ref 6–23)
BUN: 30 mg/dL — ABNORMAL HIGH (ref 6–23)
CO2: 23 mEq/L (ref 19–32)
Calcium: 8.6 mg/dL (ref 8.4–10.5)
Calcium: 7.7 mg/dL — ABNORMAL LOW (ref 8.4–10.5)
Chloride: 103 mEq/L (ref 96–112)
Chloride: 106 mEq/L (ref 96–112)
Creatinine, Ser: 1.53 mg/dL — ABNORMAL HIGH (ref 0.4–1.2)
Creatinine, Ser: 1.58 mg/dL — ABNORMAL HIGH (ref 0.4–1.2)
GFR calc Af Amer: 40 mL/min — ABNORMAL LOW (ref >60)
GFR calc Af Amer: 39 mL/min — ABNORMAL LOW (ref >60)
GFR calc non Af Amer: 33 mL/min — ABNORMAL LOW (ref >60)
Glucose, Bld: 198 mg/dL — ABNORMAL HIGH (ref 70–99)
Glucose, Bld: 82 mg/dL (ref 70–99)
Potassium: 5.3 mEq/L — ABNORMAL HIGH (ref 3.5–5.1)
Potassium: 4.3 mEq/L (ref 3.5–5.1)
Sodium: 136 mEq/L (ref 135–145)

## 2010-11-28 LAB — CK TOTAL AND CKMB (NOT AT ARMC)
CK, MB: 8.6 ng/mL (ref 0.3–4.0)
Relative Index: 2.4 (ref 0.0–2.5)
Total CK: 356 U/L — ABNORMAL HIGH (ref 7–177)

## 2010-11-28 LAB — GLUCOSE, CAPILLARY
Glucose-Capillary: 194 mg/dL — ABNORMAL HIGH (ref 70–99)
Glucose-Capillary: 168 mg/dL — ABNORMAL HIGH (ref 70–99)
Glucose-Capillary: 137 mg/dL — ABNORMAL HIGH (ref 70–99)
Glucose-Capillary: 100 mg/dL — ABNORMAL HIGH (ref 70–99)
Glucose-Capillary: 66 mg/dL — ABNORMAL LOW (ref 70–99)
Glucose-Capillary: 160 mg/dL — ABNORMAL HIGH (ref 70–99)

## 2010-11-28 LAB — POCT I-STAT 3, ART BLOOD GAS (G3+)
Acid-base deficit: 5 mmol/L — ABNORMAL HIGH (ref 0.0–2.0)
Acid-base deficit: 6 mmol/L — ABNORMAL HIGH (ref 0.0–2.0)
Bicarbonate: 20.7 meq/L (ref 20.0–24.0)
Bicarbonate: 18.7 meq/L — ABNORMAL LOW (ref 20.0–24.0)
O2 Saturation: 99 %
O2 Saturation: 99 %
Patient temperature: 100.3
Patient temperature: 98.7
TCO2: 22 mmol/L (ref 0–100)
TCO2: 20 mmol/L (ref 0–100)
pCO2 arterial: 39.7 mmHg (ref 35.0–45.0)
pCO2 arterial: 31.7 mmHg — ABNORMAL LOW (ref 35.0–45.0)
pH, Arterial: 7.33 — ABNORMAL LOW (ref 7.350–7.400)
pH, Arterial: 7.379 (ref 7.350–7.400)
pO2, Arterial: 139 mmHg — ABNORMAL HIGH (ref 80.0–100.0)
pO2, Arterial: 140 mmHg — ABNORMAL HIGH (ref 80.0–100.0)

## 2010-11-28 LAB — CBC
HCT: 32.6 % — ABNORMAL LOW (ref 36.0–46.0)
HCT: 30.6 % — ABNORMAL LOW (ref 36.0–46.0)
Hemoglobin: 10.5 g/dL — ABNORMAL LOW (ref 12.0–15.0)
Hemoglobin: 10 g/dL — ABNORMAL LOW (ref 12.0–15.0)
MCH: 29.2 pg (ref 26.0–34.0)
MCH: 29.9 pg (ref 26.0–34.0)
MCHC: 32.2 g/dL (ref 30.0–36.0)
MCHC: 32.7 g/dL (ref 30.0–36.0)
MCV: 90.6 fL (ref 78.0–100.0)
MCV: 91.3 fL (ref 78.0–100.0)
Platelets: 297 10*3/uL (ref 150–400)
Platelets: 239 10*3/uL (ref 150–400)
RBC: 3.6 MIL/uL — ABNORMAL LOW (ref 3.87–5.11)
RBC: 3.35 MIL/uL — ABNORMAL LOW (ref 3.87–5.11)
RDW: 15.4 % (ref 11.5–15.5)
RDW: 15.5 % (ref 11.5–15.5)
WBC: 16.5 10*3/uL — ABNORMAL HIGH (ref 4.0–10.5)
WBC: 15 10*3/uL — ABNORMAL HIGH (ref 4.0–10.5)

## 2010-11-28 LAB — CARDIAC PANEL(CRET KIN+CKTOT+MB+TROPI)
CK, MB: 6.6 ng/mL (ref 0.3–4.0)
Relative Index: INVALID (ref 0.0–2.5)
Relative Index: 4.2 — ABNORMAL HIGH (ref 0.0–2.5)
Total CK: 97 U/L (ref 7–177)
Troponin I: 0.8 ng/mL (ref 0.00–0.06)
Troponin I: 1.67 ng/mL (ref 0.00–0.06)

## 2010-11-28 LAB — BRAIN NATRIURETIC PEPTIDE: Pro B Natriuretic peptide (BNP): 1495 pg/mL — ABNORMAL HIGH (ref 0.0–100.0)

## 2010-11-28 LAB — HEPATIC FUNCTION PANEL
ALT: 21 U/L (ref 0–35)
AST: 25 U/L (ref 0–37)
AST: 42 U/L — ABNORMAL HIGH (ref 0–37)
Albumin: 3 g/dL — ABNORMAL LOW (ref 3.5–5.2)
Albumin: 2.7 g/dL — ABNORMAL LOW (ref 3.5–5.2)
Alkaline Phosphatase: 42 U/L (ref 39–117)
Alkaline Phosphatase: 48 U/L (ref 39–117)
Bilirubin, Direct: 0.5 mg/dL — ABNORMAL HIGH (ref 0.0–0.3)
Bilirubin, Direct: 0.4 mg/dL — ABNORMAL HIGH (ref 0.0–0.3)
Indirect Bilirubin: 0.4 mg/dL (ref 0.3–0.9)
Total Bilirubin: 1.5 mg/dL — ABNORMAL HIGH (ref 0.3–1.2)
Total Protein: 5.8 g/dL — ABNORMAL LOW (ref 6.0–8.3)
Total Protein: 5.4 g/dL — ABNORMAL LOW (ref 6.0–8.3)

## 2010-11-28 LAB — MRSA PCR SCREENING: MRSA by PCR: NEGATIVE

## 2010-11-28 LAB — MAGNESIUM: Magnesium: 1.5 mg/dL (ref 1.5–2.5)

## 2010-11-28 LAB — TROPONIN I: Troponin I: 2.16 ng/mL (ref 0.00–0.06)

## 2010-11-29 LAB — URINE CULTURE
Colony Count: 10000
Colony Count: NO GROWTH
Culture  Setup Time: 201202231847
Culture  Setup Time: 201202241111
Culture: NO GROWTH
Special Requests: NEGATIVE

## 2010-11-29 LAB — GLUCOSE, CAPILLARY
Glucose-Capillary: 175 mg/dL — ABNORMAL HIGH (ref 70–99)
Glucose-Capillary: 181 mg/dL — ABNORMAL HIGH (ref 70–99)

## 2010-11-29 LAB — CBC
HCT: 31.1 % — ABNORMAL LOW (ref 36.0–46.0)
Hemoglobin: 9.9 g/dL — ABNORMAL LOW (ref 12.0–15.0)
MCH: 29.4 pg (ref 26.0–34.0)
MCHC: 31.8 g/dL (ref 30.0–36.0)
MCV: 92.3 fL (ref 78.0–100.0)
Platelets: 232 10*3/uL (ref 150–400)
RBC: 3.37 MIL/uL — ABNORMAL LOW (ref 3.87–5.11)
RDW: 15.4 % (ref 11.5–15.5)
WBC: 13.2 10*3/uL — ABNORMAL HIGH (ref 4.0–10.5)

## 2010-11-29 LAB — BASIC METABOLIC PANEL
BUN: 29 mg/dL — ABNORMAL HIGH (ref 6–23)
Calcium: 7.7 mg/dL — ABNORMAL LOW (ref 8.4–10.5)
Creatinine, Ser: 1.43 mg/dL — ABNORMAL HIGH (ref 0.4–1.2)
GFR calc Af Amer: 44 mL/min — ABNORMAL LOW (ref >60)
GFR calc non Af Amer: 36 mL/min — ABNORMAL LOW (ref >60)
Glucose, Bld: 190 mg/dL — ABNORMAL HIGH (ref 70–99)
Sodium: 137 mEq/L (ref 135–145)

## 2010-11-29 LAB — CK TOTAL AND CKMB (NOT AT ARMC)
CK, MB: 11.6 ng/mL (ref 0.3–4.0)
Total CK: 637 U/L — ABNORMAL HIGH (ref 7–177)

## 2010-11-29 LAB — TROPONIN I: Troponin I: 2.14 ng/mL (ref 0.00–0.06)

## 2010-11-30 LAB — GLUCOSE, CAPILLARY
Glucose-Capillary: 133 mg/dL — ABNORMAL HIGH (ref 70–99)
Glucose-Capillary: 148 mg/dL — ABNORMAL HIGH (ref 70–99)
Glucose-Capillary: 171 mg/dL — ABNORMAL HIGH (ref 70–99)
Glucose-Capillary: 195 mg/dL — ABNORMAL HIGH (ref 70–99)

## 2010-12-01 LAB — DIFFERENTIAL
Basophils Absolute: 0 10*3/uL (ref 0.0–0.1)
Basophils Relative: 0 % (ref 0–1)
Eosinophils Absolute: 0.2 10*3/uL (ref 0.0–0.7)
Eosinophils Relative: 2 % (ref 0–5)
Lymphocytes Relative: 11 % — ABNORMAL LOW (ref 12–46)
Lymphs Abs: 1.1 10*3/uL (ref 0.7–4.0)
Monocytes Absolute: 1.4 10*3/uL — ABNORMAL HIGH (ref 0.1–1.0)
Monocytes Relative: 15 % — ABNORMAL HIGH (ref 3–12)
Neutro Abs: 6.8 10*3/uL (ref 1.7–7.7)
Neutrophils Relative %: 72 % (ref 43–77)

## 2010-12-01 LAB — CBC
HCT: 28.2 % — ABNORMAL LOW (ref 36.0–46.0)
Hemoglobin: 9.1 g/dL — ABNORMAL LOW (ref 12.0–15.0)
MCH: 29.5 pg (ref 26.0–34.0)
MCHC: 32.3 g/dL (ref 30.0–36.0)
MCV: 91.6 fL (ref 78.0–100.0)
Platelets: 237 10*3/uL (ref 150–400)
RBC: 3.08 MIL/uL — ABNORMAL LOW (ref 3.87–5.11)
RDW: 15 % (ref 11.5–15.5)
WBC: 9.5 10*3/uL (ref 4.0–10.5)

## 2010-12-01 LAB — MRSA PCR SCREENING: MRSA by PCR: NEGATIVE

## 2010-12-01 LAB — BRAIN NATRIURETIC PEPTIDE: Pro B Natriuretic peptide (BNP): 1363 pg/mL — ABNORMAL HIGH (ref 0.0–100.0)

## 2010-12-01 LAB — GLUCOSE, CAPILLARY: Glucose-Capillary: 191 mg/dL — ABNORMAL HIGH (ref 70–99)

## 2010-12-01 LAB — BASIC METABOLIC PANEL
BUN: 18 mg/dL (ref 6–23)
Calcium: 8.7 mg/dL (ref 8.4–10.5)
Creatinine, Ser: 1.13 mg/dL (ref 0.4–1.2)
GFR calc Af Amer: 57 mL/min — ABNORMAL LOW (ref >60)
GFR calc non Af Amer: 47 mL/min — ABNORMAL LOW (ref >60)
Glucose, Bld: 201 mg/dL — ABNORMAL HIGH (ref 70–99)
Potassium: 3.8 mEq/L (ref 3.5–5.1)

## 2010-12-02 LAB — GLUCOSE, CAPILLARY
Glucose-Capillary: 187 mg/dL — ABNORMAL HIGH (ref 70–99)
Glucose-Capillary: 156 mg/dL — ABNORMAL HIGH (ref 70–99)
Glucose-Capillary: 138 mg/dL — ABNORMAL HIGH (ref 70–99)
Glucose-Capillary: 147 mg/dL — ABNORMAL HIGH (ref 70–99)
Glucose-Capillary: 142 mg/dL — ABNORMAL HIGH (ref 70–99)

## 2010-12-02 LAB — CBC
HCT: 28 % — ABNORMAL LOW (ref 36.0–46.0)
Hemoglobin: 9 g/dL — ABNORMAL LOW (ref 12.0–15.0)
MCH: 28.8 pg (ref 26.0–34.0)
MCHC: 32.1 g/dL (ref 30.0–36.0)
MCV: 89.7 fL (ref 78.0–100.0)
Platelets: 274 10*3/uL (ref 150–400)
RBC: 3.12 MIL/uL — ABNORMAL LOW (ref 3.87–5.11)
RDW: 14.9 % (ref 11.5–15.5)
WBC: 9.6 10*3/uL (ref 4.0–10.5)

## 2010-12-02 LAB — BASIC METABOLIC PANEL
BUN: 17 mg/dL (ref 6–23)
Calcium: 8.8 mg/dL (ref 8.4–10.5)
Chloride: 106 mEq/L (ref 96–112)
GFR calc Af Amer: 55 mL/min — ABNORMAL LOW (ref >60)
GFR calc non Af Amer: 45 mL/min — ABNORMAL LOW (ref >60)
Glucose, Bld: 150 mg/dL — ABNORMAL HIGH (ref 70–99)
Potassium: 3.1 mEq/L — ABNORMAL LOW (ref 3.5–5.1)
Sodium: 139 mEq/L (ref 135–145)

## 2010-12-02 LAB — BRAIN NATRIURETIC PEPTIDE: Pro B Natriuretic peptide (BNP): 1233 pg/mL — ABNORMAL HIGH (ref 0.0–100.0)

## 2010-12-02 NOTE — Assessment & Plan Note (Signed)
Summary: ABD PAIN,BLOATING,FEVER,CHILLS/CLE   [employer]   Vital Signs:  Patient profile:   76 year old female Weight:      158.75 pounds Temp:     98.1 degrees F oral Pulse rate:   72 / minute Pulse rhythm:   regular BP sitting:   120 / 78  (left arm) Cuff size:   large  Vitals Entered By: Sydell Axon LPN (November 27, 2010 10:58 AM) CC: Abd. pain, chills, bloating and gas   History of Present Illness: Pt here for abdominal complaints. She is very bloated and tender in her abdomen. She had chills last night and couldn't get warm. She had pain in the right shoulder as well. Her last BM was yesterday AM and was nml. She felt fine yest AM, up until 5 PM. She then took a nap and then wasn't feeling well. Her last food was yest was at 2 PM at [facility], a lean ham sandwich on rye with potato chips and caffeine free diet coke. Her husband ate there also and feels fine today. Last urination was this AM, no pain and no burning. She brought a urine sample which looks nml on Dip and my micro. She has had 101.6 last night, none taken this AM. She didn't get up until 9 AM this morning. She is not hungry. She has pain in the abdomen periumilically. It is uncomfortable to have anything around her waist and is distended. She has passed some gas this AM, no burping. She has been sipping OJ and water. She had typical bowl of cheerios for brfst with banana and strawberries.   Problems Prior to Update: 1)  Unspecified Vitamin D Deficiency  (ICD-268.9) 2)  Carotid Bruit  (ICD-785.9) 3)  Special Screening Malig Neoplasms Other Sites  (ICD-V76.49) 4)  Peripheral Vascular Disease  (ICD-443.9) 5)  Osteoporosis  (ICD-733.00) 6)  Hypertension  (ICD-401.9) 7)  Hyperlipidemia  (ICD-272.4) 8)  Diabetes Mellitus, Type II  (ICD-250.00) 9)  Coronary Artery Disease  (ICD-414.00) 10)  Breast Cancer, Hx Of, Right Breast  (ICD-V10.3) 11)  Atrial Fibrillation  (ICD-427.31) 12)  Anemia-iron Deficiency   (ICD-280.9)  Medications Prior to Update: 1)  Humalog 100 Unit/ml Soln (Insulin Lispro (Human)) .... Four Times A Day As Needed 2)  Metformin Hcl 500 Mg Tabs (Metformin Hcl) .Marland Kitchen.. 1 By Mouth At Bedtime 3)  Actos 45 Mg Tabs (Pioglitazone Hcl) .Marland Kitchen.. 1 Once Daily 4)  Sliding Scale Insulin 5)  Isosorbide Dinitrate 30 Mg  Tabs (Isosorbide Dinitrate) .Marland Kitchen.. 1 Daily By Mouth 6)  Benazepril Hcl 10 Mg  Tabs (Benazepril Hcl) .Marland Kitchen.. 1 Daily By Mouth 7)  Nitroquick 0.4 Mg  Subl (Nitroglycerin) .... As Directed 8)  Simvastatin 40 Mg  Tabs (Simvastatin) .Marland Kitchen.. 1 Tablet By Mouth At Bedtime Holding 9)  Triamterene-Hctz 37.5-25 Mg  Caps (Triamterene-Hctz) .Marland Kitchen.. 1daily By Mouth 10)  Sotalol Hcl (Af) 80 Mg  Tabs (Sotalol Hcl Af) .... 1/2 Twice A Day By Mouth 11)  Plavix 75 Mg  Tabs (Clopidogrel Bisulfate) .Marland Kitchen.. 1 Daily By Mouth 12)  Glyburide-Metformin 5-500 Mg  Tabs (Glyburide-Metformin) .... 2 Tablets Am 13)  Onetouch Ultra Test   Strp (Glucose Blood) .... Test Four Times A Day For Sliding Scale 14)  Bd Insulin Syringe Ultrafine 31g X 5/16" 0.3 Ml  Misc (Insulin Syringe-Needle U-100) .... Use Daily and As Directed Sliding Scale 15)  Vitamin D 1000 Unit Caps (Cholecalciferol) .Marland Kitchen.. 1 Tablet Twice A Day 16)  Bayer Aspirin Ec Low Dose 81 Mg Tbec (  Aspirin) .Marland Kitchen.. 1 Daily By Mouth 17)  Fish Oil 1200 Mg Caps (Omega-3 Fatty Acids) .Marland Kitchen.. 1 Tablet By Mouth Daily 18)  Diltiazem Hcl 90 Mg Tabs (Diltiazem Hcl) .... Take One By Mouth Daily 19)  Vitamin B-12 1000 Mcg Tabs (Cyanocobalamin) .... Take One By Mouth Daily  Current Medications (verified): 1)  Humalog 100 Unit/ml Soln (Insulin Lispro (Human)) .... Four Times A Day As Needed 2)  Metformin Hcl 500 Mg Tabs (Metformin Hcl) .Marland Kitchen.. 1 By Mouth At Bedtime 3)  Actos 45 Mg Tabs (Pioglitazone Hcl) .Marland Kitchen.. 1 Once Daily 4)  Sliding Scale Insulin 5)  Isosorbide Dinitrate 30 Mg  Tabs (Isosorbide Dinitrate) .Marland Kitchen.. 1 Daily By Mouth 6)  Benazepril Hcl 10 Mg  Tabs (Benazepril Hcl) .Marland Kitchen.. 1 Daily By  Mouth 7)  Nitroquick 0.4 Mg  Subl (Nitroglycerin) .... As Directed 8)  Triamterene-Hctz 37.5-25 Mg  Caps (Triamterene-Hctz) .Marland Kitchen.. 1daily By Mouth 9)  Sotalol Hcl (Af) 80 Mg  Tabs (Sotalol Hcl Af) .... 1/2 Twice A Day By Mouth 10)  Plavix 75 Mg  Tabs (Clopidogrel Bisulfate) .Marland Kitchen.. 1 Daily By Mouth 11)  Glyburide-Metformin 5-500 Mg  Tabs (Glyburide-Metformin) .... 2 Tablets Am 12)  Onetouch Ultra Test   Strp (Glucose Blood) .... Test Four Times A Day For Sliding Scale 13)  Bd Insulin Syringe Ultrafine 31g X 5/16" 0.3 Ml  Misc (Insulin Syringe-Needle U-100) .... Use Daily and As Directed Sliding Scale 14)  Vitamin D 1000 Unit Caps (Cholecalciferol) .Marland Kitchen.. 1 Tablet Twice A Day 15)  Bayer Aspirin Ec Low Dose 81 Mg Tbec (Aspirin) .Marland Kitchen.. 1 Daily By Mouth 16)  Fish Oil 1200 Mg Caps (Omega-3 Fatty Acids) .Marland Kitchen.. 1 Tablet By Mouth Daily 17)  Diltiazem Hcl 90 Mg Tabs (Diltiazem Hcl) .... Take One By Mouth Daily 18)  Vitamin B-12 1000 Mcg Tabs (Cyanocobalamin) .... Take One By Mouth Daily 19)  Atorvastatin Calcium 20 Mg Tabs (Atorvastatin Calcium) .... Take One By Mouth At Bedtime 20)  Zetia 10 Mg Tabs (Ezetimibe) .... Take One By Mouth Daily 21)  Meclizine Hcl 25 Mg Tabs (Meclizine Hcl) .... Take One By Mouth Three Times A Day  Physical Exam  General:  Well-developed,well-nourished,in no acute distress; alert,appropriate and cooperative throughout examination, bloated and sitting/walking hesitantly due to discomfort periumbilically. Head:  Normocephalic and atraumatic without obvious abnormalities. No apparent alopecia or balding. Sinuses nontender. Eyes:  Conjunctiva clear bilaterally. Ears:  External ear exam shows no significant lesions or deformities.  Otoscopic examination reveals clear canals, tympanic membranes are intact bilaterally without bulging, retraction, inflammation or discharge. Hearing is grossly normal bilaterally. Cerumen bilat. Nose:  External nasal examination shows no deformity or  inflammation. Nasal mucosa are pink and moist without lesions. Mouth:  Oral mucosa and oropharynx without lesions or exudates.  Teeth in good repair.  Neck:  No deformities, masses, or tenderness noted. Chest Wall:  No deformities, masses, or tenderness noted. Lungs:  Normal respiratory effort, chest expands symmetrically. Lungs are clear to auscultation, no crackles or wheezes. Heart:   S1 and S2 normal without gallop, murmur, click, rub or other extra sounds. Carotid with faint bruit on left. Abdomen:  Bloated and tender globally, worse periumbilically. No masse felt but pt does not tolerate pressure/palpation well. Active normal sounding bowel sounds. She is quite distended over her baseline.   Impression & Recommendations:  Problem # 1:  ABDOMINAL PAIN, PERIUMBILIC (ICD-789.05) Assessment New U/a ok. Will get two view Abd series. This shows A/F lvls. Will send pt to ER for  further eval and probable admission. Orders: T-Abdomen 2-view (74020TC) A/F lvls seen.  Complete Medication List: 1)  Humalog 100 Unit/ml Soln (Insulin lispro (human)) .... Four times a day as needed 2)  Metformin Hcl 500 Mg Tabs (Metformin hcl) .Marland Kitchen.. 1 by mouth at bedtime 3)  Actos 45 Mg Tabs (Pioglitazone hcl) .Marland Kitchen.. 1 once daily 4)  Sliding Scale Insulin  5)  Isosorbide Dinitrate 30 Mg Tabs (Isosorbide dinitrate) .Marland Kitchen.. 1 daily by mouth 6)  Benazepril Hcl 10 Mg Tabs (Benazepril hcl) .Marland Kitchen.. 1 daily by mouth 7)  Nitroquick 0.4 Mg Subl (Nitroglycerin) .... As directed 8)  Triamterene-hctz 37.5-25 Mg Caps (Triamterene-hctz) .Marland Kitchen.. 1daily by mouth 9)  Sotalol Hcl (af) 80 Mg Tabs (Sotalol hcl af) .... 1/2 twice a day by mouth 10)  Plavix 75 Mg Tabs (Clopidogrel bisulfate) .Marland Kitchen.. 1 daily by mouth 11)  Glyburide-metformin 5-500 Mg Tabs (Glyburide-metformin) .... 2 tablets am 12)  Onetouch Ultra Test Strp (Glucose blood) .... Test four times a day for sliding scale 13)  Bd Insulin Syringe Ultrafine 31g X 5/16" 0.3 Ml Misc  (Insulin syringe-needle u-100) .... Use daily and as directed sliding scale 14)  Vitamin D 1000 Unit Caps (Cholecalciferol) .Marland Kitchen.. 1 tablet twice a day 15)  Bayer Aspirin Ec Low Dose 81 Mg Tbec (Aspirin) .Marland Kitchen.. 1 daily by mouth 16)  Fish Oil 1200 Mg Caps (Omega-3 fatty acids) .Marland Kitchen.. 1 tablet by mouth daily 17)  Diltiazem Hcl 90 Mg Tabs (Diltiazem hcl) .... Take one by mouth daily 18)  Vitamin B-12 1000 Mcg Tabs (Cyanocobalamin) .... Take one by mouth daily 19)  Atorvastatin Calcium 20 Mg Tabs (Atorvastatin calcium) .... Take one by mouth at bedtime 20)  Zetia 10 Mg Tabs (Ezetimibe) .... Take one by mouth daily 21)  Meclizine Hcl 25 Mg Tabs (Meclizine hcl) .... Take one by mouth three times a day  Other Orders: UA Dipstick W/ Micro (manual) ([id number])  Patient Instructions: 1)  To ER for fuerther eval and probable admission for decompression.   Orders Added: 1)  UA Dipstick W/ Micro (manual) [81000] 2)  T-Abdomen 2-view [74020TC] 3)  Est. Patient Level IV [59563]      Laboratory Results   Urine Tests  Date/Time Received: November 27, 2010 11:11 AM  Date/Time Reported: November 27, 2010 11:11 AM   Routine Urinalysis   Color: yellow Appearance: Hazy Glucose: negative   (Normal Range: Negative) Bilirubin: negative   (Normal Range: Negative) Ketone: negative   (Normal Range: Negative) Spec. Gravity: 1.010   (Normal Range: 1.003-1.035) Blood: small   (Normal Range: Negative) pH: 7.0   (Normal Range: 5.0-8.0) Protein: trace   (Normal Range: Negative) Urobilinogen: 0.2   (Normal Range: 0-1) Nitrite: negative   (Normal Range: Negative) Leukocyte Esterace: small   (Normal Range: Negative)

## 2010-12-03 LAB — CARDIAC PANEL(CRET KIN+CKTOT+MB+TROPI)
CK, MB: 2.6 ng/mL (ref 0.3–4.0)
Relative Index: INVALID (ref 0.0–2.5)
Troponin I: 1.11 ng/mL (ref 0.00–0.06)

## 2010-12-03 LAB — BASIC METABOLIC PANEL
BUN: 14 mg/dL (ref 6–23)
Calcium: 8.5 mg/dL (ref 8.4–10.5)
GFR calc Af Amer: >60 mL/min (ref >60)
GFR calc non Af Amer: 51 mL/min — ABNORMAL LOW (ref >60)
Glucose, Bld: 165 mg/dL — ABNORMAL HIGH (ref 70–99)
Potassium: 3.3 mEq/L — ABNORMAL LOW (ref 3.5–5.1)

## 2010-12-03 LAB — BRAIN NATRIURETIC PEPTIDE: Pro B Natriuretic peptide (BNP): 1363 pg/mL — ABNORMAL HIGH (ref 0.0–100.0)

## 2010-12-04 LAB — CBC
HCT: 28.5 % — ABNORMAL LOW (ref 36.0–46.0)
Hemoglobin: 9.2 g/dL — ABNORMAL LOW (ref 12.0–15.0)
MCH: 28.8 pg (ref 26.0–34.0)
MCHC: 32.3 g/dL (ref 30.0–36.0)
MCV: 89.1 fL (ref 78.0–100.0)
Platelets: 324 10*3/uL (ref 150–400)
RBC: 3.2 MIL/uL — ABNORMAL LOW (ref 3.87–5.11)
RDW: 15.2 % (ref 11.5–15.5)
WBC: 10.4 10*3/uL (ref 4.0–10.5)

## 2010-12-04 LAB — DIFFERENTIAL
Basophils Absolute: 0.1 10*3/uL (ref 0.0–0.1)
Basophils Relative: 1 % (ref 0–1)
Eosinophils Absolute: 0.4 10*3/uL (ref 0.0–0.7)
Eosinophils Relative: 4 % (ref 0–5)
Lymphocytes Relative: 15 % (ref 12–46)
Lymphs Abs: 1.6 10*3/uL (ref 0.7–4.0)
Monocytes Absolute: 1.2 10*3/uL — ABNORMAL HIGH (ref 0.1–1.0)
Monocytes Relative: 12 % (ref 3–12)
Neutro Abs: 7.1 10*3/uL (ref 1.7–7.7)
Neutrophils Relative %: 68 % (ref 43–77)

## 2010-12-04 LAB — BASIC METABOLIC PANEL
BUN: 10 mg/dL (ref 6–23)
CO2: 24 mEq/L (ref 19–32)
Calcium: 8.2 mg/dL — ABNORMAL LOW (ref 8.4–10.5)
Creatinine, Ser: 1.02 mg/dL (ref 0.4–1.2)
GFR calc Af Amer: >60 mL/min (ref >60)
GFR calc non Af Amer: 53 mL/min — ABNORMAL LOW (ref >60)

## 2010-12-04 LAB — GLUCOSE, CAPILLARY
Glucose-Capillary: 127 mg/dL — ABNORMAL HIGH (ref 70–99)
Glucose-Capillary: 175 mg/dL — ABNORMAL HIGH (ref 70–99)
Glucose-Capillary: 187 mg/dL — ABNORMAL HIGH (ref 70–99)
Glucose-Capillary: 220 mg/dL — ABNORMAL HIGH (ref 70–99)
Glucose-Capillary: 230 mg/dL — ABNORMAL HIGH (ref 70–99)

## 2010-12-04 NOTE — Consult Note (Signed)
[name]                  ACCOUNT NO.:  [id number]  MEDICAL RECORD NO.:  [id number]           PATIENT TYPE:  LOCATION:                                 FACILITY:  PHYSICIAN:  [name], M.D. DATE OF BIRTH:  May 07, 1935  DATE OF CONSULTATION: DATE OF DISCHARGE:                                CONSULTATION   HISTORY:  [name] is a 76 year old female who is followed by Dr. Clarene Farrell and Dr. Hetty Farrell.  She has a history of coronary artery disease.  She had bypass grafting in May 2003 x4 with LIMA to the LAD, SVG to diagonal, SVG to the OM and an SVG to the RCA.  The SVG to the OM was intervened on in June 2008.  She had an abnormal Myoview May 2011 and some vague chest discomfort and was restudied.  The SVG to OM had a new stenosis, the previously placed stent from June 2008 was patent.  The SVG to the RCA also had a significant stenosis.  SVG to OM was dilated and stented with a Veriflex stent.  The RCA was stented with a Mini Vision stent.  The LIMA to the LAD was patent ant the SVG to diagonal was occluded.  She had been treated medically.  Dr. Clarene Farrell notes in his cath note from May 2011 that her EF by Myoview was 64%.  The patient is admitted now with apparent perforated bowel from possibly ruptured diverticula.  We are asked to see her postoperatively as her initial troponin was 0.08 and a second troponin was 1.67.  There is no history of the EKG changes or chest pain.  She is currently intubated and undergoing a sterile procedure and is unable to be examined.  She had been otherwise stable postoperatively.  PAST MEDICAL HISTORY:  Remarkable for type 2 non-insulin-dependent diabetes.  She has had PAF in the past and has been on off sotalol as an outpatient.  She has treated hypertension and dyslipidemia.  She had a history of vascular disease, she had a left iliac stent in 2000 and has less than 50% bilateral internal carotid disease by outpatient Dopplers. She has a  history of breast cancer and had a lumpectomy in 2000 as well as radiation therapy.  She had right shoulder surgery in June 2004.  She is status post remote appendectomy.  MEDICATIONS:  Her office records are pending, but known medications include Actos, aspirin, benazepril, diltiazem, Imdur, Lipitor, meclizine p.r.n., metformin, Plavix, sotalol, triamterene hydrochlorothiazide, and Zetia.  ALLERGIES:  SHE HAS NO KNOWN DRUG ALLERGIES.  SOCIAL HISTORY:  She is married.  She has two children.  She is nonsmoker.  FAMILY HISTORY:  Currently unobtainable.  REVIEW OF SYSTEMS:  Unobtainable.  PHYSICAL EXAMINATION:  VITAL SIGNS:  Blood pressure 117/70, pulse 82 and temperature 98.5.  the reminder of the her physical exam is unable to be obtained at this time it will be examined when her sterile procedure for line placement is finished.  LABS:  Troponin as noted 0.08 and 1.67.  Sodium 136, potassium 5.3, BUN 27, creatinine 1.53.  LFTs were normal.  White count 16.5, hemoglobin 10.5, hematocrit 32.6, platelets 297.  There is no EKG in the chart.  IMPRESSION: 1. Positive troponin post surgery for perforated colon. 2. Coronary artery disease with coronary artery bypass grafting May     2003 with SVG to OM intervention June 2008, new SVG to OM     intervention May 2011 as well as SVG to RCA intervention May 2011,     the patient was on Plavix prior to admission. 3. History of paroxysmal atrial fibrillation, sinus rhythm on sotalol. 4. Type 2 non-insulin-dependent diabetes. 5. Treated hypertension. 6. Dyslipidemia with myalgias on Zocor, now on Lipitor. 7. Vascular disease with mild internal carotid disease and prior left     iliac stenting in 2000. 8. History of breast cancer, status post lumpectomy and radiation in     2000. 9. History of sinus bradycardia in the past, her diltiazem dose had to     be cut back in May 2011.  PLAN:  We will go ahead and check EKG and continue cycle her  enzymes. We will get office records regarding her home medications.  She will be at risk for recurrent atrial fibrillation as she will be off sotalol, we may want to consider amiodarone in the interim, this would be discussed with cardiologist.     [name], P.A.   ______________________________ April Solo. [name], M.D.    [name]  D:  11/28/2010  T:  11/28/2010  Job:  [id number]  cc:   April Silence, MD  Electronically Signed by [name] P.A. on 12/01/2010 02:05:27 PM Electronically Signed by [name] M.D. on 12/04/2010 07:58:32 AM

## 2010-12-05 ENCOUNTER — Inpatient Hospital Stay (HOSPITAL_COMMUNITY): Payer: Medicare Other

## 2010-12-05 LAB — BASIC METABOLIC PANEL
BUN: 11 mg/dL (ref 6–23)
BUN: 10 mg/dL (ref 6–23)
Calcium: 8 mg/dL — ABNORMAL LOW (ref 8.4–10.5)
Calcium: 8 mg/dL — ABNORMAL LOW (ref 8.4–10.5)
Chloride: 109 mEq/L (ref 96–112)
Chloride: 106 mEq/L (ref 96–112)
Creatinine, Ser: 1.05 mg/dL (ref 0.4–1.2)
Creatinine, Ser: 1 mg/dL (ref 0.4–1.2)
GFR calc Af Amer: >60 mL/min (ref >60)
GFR calc Af Amer: >60 mL/min (ref >60)
GFR calc non Af Amer: 51 mL/min — ABNORMAL LOW (ref >60)
GFR calc non Af Amer: 54 mL/min — ABNORMAL LOW (ref >60)
Glucose, Bld: 179 mg/dL — ABNORMAL HIGH (ref 70–99)
Potassium: 4.1 mEq/L (ref 3.5–5.1)
Potassium: 3.9 mEq/L (ref 3.5–5.1)
Sodium: 140 mEq/L (ref 135–145)

## 2010-12-05 LAB — GLUCOSE, CAPILLARY
Glucose-Capillary: 212 mg/dL — ABNORMAL HIGH (ref 70–99)
Glucose-Capillary: 150 mg/dL — ABNORMAL HIGH (ref 70–99)
Glucose-Capillary: 166 mg/dL — ABNORMAL HIGH (ref 70–99)
Glucose-Capillary: 181 mg/dL — ABNORMAL HIGH (ref 70–99)
Glucose-Capillary: 213 mg/dL — ABNORMAL HIGH (ref 70–99)

## 2010-12-05 LAB — CBC
HCT: 28.9 % — ABNORMAL LOW (ref 36.0–46.0)
Hemoglobin: 9.4 g/dL — ABNORMAL LOW (ref 12.0–15.0)
MCH: 29.7 pg (ref 26.0–34.0)
MCHC: 32.5 g/dL (ref 30.0–36.0)
MCV: 91.5 fL (ref 78.0–100.0)
Platelets: 351 10*3/uL (ref 150–400)
RBC: 3.16 MIL/uL — ABNORMAL LOW (ref 3.87–5.11)
RDW: 15.8 % — ABNORMAL HIGH (ref 11.5–15.5)
WBC: 10.6 10*3/uL — ABNORMAL HIGH (ref 4.0–10.5)

## 2010-12-05 LAB — BRAIN NATRIURETIC PEPTIDE: Pro B Natriuretic peptide (BNP): 1968 pg/mL — ABNORMAL HIGH (ref 0.0–100.0)

## 2010-12-05 LAB — PROTIME-INR
INR: 1.32 (ref 0.00–1.49)
Prothrombin Time: 16.6 seconds — ABNORMAL HIGH (ref 11.6–15.2)

## 2010-12-05 NOTE — H&P (Signed)
[name], [name]                  ACCOUNT NO.:  [id number]  MEDICAL RECORD NO.:  [id number]           PATIENT TYPE:  I  LOCATION:  2302                         FACILITY:  [facility]  PHYSICIAN:  Gabrielle Dare. Janee Morn, M.D.DATE OF BIRTH:  07/22/1935  DATE OF ADMISSION:  11/27/2010 DATE OF DISCHARGE:                             HISTORY & PHYSICAL   CHIEF COMPLAINT:  Abdominal pain and free intraperitoneal air.  HISTORY OF PRESENT ILLNESS:  [name] is a 76 year old white female with multiple medical problems who developed abdominal pain yesterday.  The pain persisted and she developed some abdominal distention.  She had some fever overnight.  She was seen by her primary care physician today who did some x-rays.  They were concerned for small bowel obstruction. She has had decreased appetite.  She came to [facility] Emergency Department for further evaluation.  CT scan of abdomen and pelvis shows perforated sigmoid diverticulitis with free air and associated partial small bowel obstruction.  PAST MEDICAL HISTORY:  Coronary artery disease, insulin-dependent diabetes mellitus, hypertension, hypercholesterolemia, breast cancer.  PAST SURGICAL HISTORY:  Right lumpectomy, coronary artery bypass grafting, appendectomy, and D and C.  SOCIAL HISTORY:  She does not smoke.  She lives with her husband.  REVIEW OF SYSTEMS:  GI include the abdominal pain and distention primary related above in addition some of the abdominal pain extend up into her right shoulder.  Remainder of the system review is unremarkable.  Medications include Actos, aspirin, benazepril, diltiazem, isosorbide mononitrate, Lipitor, meclizine, metformin, Plavix, sotalol, triamterene, hydrochlorothiazide, and Zetia.  ALLERGIES:  No known drug allergies.  PHYSICAL EXAMINATION:  VITAL SIGNS:  Temperature 98.5, heart rate 62, blood pressure 133/65, respirations 20, saturations 99%. HEENT:  The patient is alert and awake.  Ears  are clear.  Nares are patent.  Oral mucosa is dry. NECK:  Has trachea midline with no masses. LUNGS:  Clear to auscultation with good respiratory effort.  No wheezing is heard. Cardiac:  S1 and S2.  Impulse is vaguely palpable in the left chest. Distal pulses are 1+ with no significant peripheral edema.  ABDOMEN: Distended with hypoactive bowel sounds.  She is tender in the right side and left side especially in left lower quadrant and suprapubic region with voluntary guarding.  No masses are felt. MUSCULOSKELETAL:  No deformities. SKIN:  Has some bruising over abdominal wall due to insulin injections.  LABORATORY STUDIES:  White blood cell count 14.5, hemoglobin 10.9, platelets 301.  Sodium 140, potassium 4.4, chloride 104, CO2 of 25, BUN 27, creatinine 1.22, glucose 57, lipase 24, lactate 1.8.  Diagnostic CT scan, abdomen and pelvis above.  IMPRESSION:  Perforated sigmoid diverticulitis.  PLAN:  We will give her IV antibiotics.  We will take her to operating room tonight for emergent sigmoid colectomy and colostomy.  Procedure, risks, and benefits including her increased risk for bleeding due to her Plavix and aspirin were discussed in detail with the and her husband, she is agreeable.     Gabrielle Dare Janee Morn, M.D.     BET/MEDQ  D:  11/27/2010  T:  11/28/2010  Job:  [id number]  cc:   [name], M.D.  Electronically Signed by Violeta Gelinas M.D. on 12/05/2010 10:15:59 AM

## 2010-12-05 NOTE — Op Note (Signed)
[name], [name]                  ACCOUNT NO.:  [id number]  MEDICAL RECORD NO.:  [id number]           PATIENT TYPE:  I  LOCATION:  2302                         FACILITY:  [facility]  PHYSICIAN:  Gabrielle Dare. Janee Morn, M.D.DATE OF BIRTH:  1934-10-27  DATE OF PROCEDURE:  11/27/2010 DATE OF DISCHARGE:                              OPERATIVE REPORT   PREOPERATIVE DIAGNOSIS:  Perforated sigmoid diverticulitis.  POSTOPERATIVE DIAGNOSIS:  Perforated sigmoid diverticulitis.  PROCEDURE:  Sigmoid colectomy and colostomy.  SURGEON:  Gabrielle Dare. Janee Morn, MD  ASSISTANT:  Adolph Pollack, MD  SECOND ASSISTANT:  Ignacia Palma, RN FAS  ANESTHESIA:  General endotracheal.  FINDINGS:  Perforated sigmoid colon.  ESTIMATED BLOOD LOSS:  75 mL.  HISTORY OF PRESENT ILLNESS:  [name] is a 77 year old female who presented to the emergency department with abdominal pain and concern for small bowel obstruction as referred by her primary care physician. CT scan of the abdomen and pelvis demonstrated free air and perforated sigmoid diverticulitis.  She was given intravenous antibiotics and brought emergently to the operating room.  PROCEDURE IN DETAIL:  Informed consent was obtained.  The patient was identified the preoperative holding area.  She was brought to the operating room.  General endotracheal anesthesia was administered by anesthesia staff.  Her abdomen was prepped and draped in sterile fashion.  Time-out procedure was done.  Midline incision was made. Subcutaneous tissues were dissected down revealing the anterior fascia. This was divided sharply along the midline.  Peritoneal cavity was entered under direct vision without difficulty.  The fascia was opened through the length of the incision.  The exploration revealed an inflamed segment of proximal sigmoid colon with a localized perforation. We placed a retractor and packed the small bowel out of the way.  The distal left colon was  mobilized from the lateral peritoneal attachments. The healthy section of the colon proximal to the inflamed perforated area was selected and divided with a GIA 75 stapler.  We then went down distal to the inflammation and perforation.  There was some significant diverticular disease there but without inflammation.  We got down below the majority of that to a healthy section of colon.  This was divided with a GIA 75 stapler.  The intervening mesentery was then taken with LigaSure and multiple suture ligatures of 2-0 silk suture to get excellent hemostasis.  The specimen was marked for anatomic orientation and sent to pathology.  Some additional suture ligatures were placed, getting excellent hemostasis along the mesentery.  We stayed along the edge of the colon so as to avoid the ureter.  The area was copiously irrigated, and hemostasis was ensured.  We marked the distal stump of colon with the 2-0 Prolene suture.  Next, the descending colon and proximal sigmoid was mobilized more from the lateral peritoneal attachments and a little bit more of the mesentery was taken to be able to free up enough to bring out for a colostomy.  Next, we removed our self-retaining retractor and irrigated the entire abdomen with multiple liters of warm saline.  Irrigation fluid returned clear, and there was  no significant contamination noted.  Next, we selected a place in the left lower quadrant for a colostomy.  A circular incision was made in the skin, and the fat was cored out down to the fascia.  A cruciate incision was made in the fascia, and this was brought through into the peritoneal cavity.  This aperture in the muscle was opened up to fit two fingers.  The distal colon was brought out through there as the colostomy.  It was tacked up inside to the abdominal wall with 2-0 and 3- 0 silk sutures.  It appeared viable.  A small clamp was placed on the fat to keep it from retracting.  Next, the NG tube  was confirmed in position.  The abdomen was irrigated a little bit more and the irrigation fluid returned clear.  We rechecked our distal stump and our mesentery and there was good hemostasis.  Omentum was brought back down over the bowel after bowel was returned to anatomic position.  The counts were correct, fascia was closed with running #1 looped PDS from each end of the fascia, tied in the middle.  Subcutaneous tissues were irrigated, and the skin was closed loosely with staples.  The colostomy was viable.  It was matured with 3-0 Vicryl sutures and was patent.  An ostomy appliance and a sterile wet-to-dry dressing were placed on the wound.  Sponge, needle, and instrument counts were all correct.  The patient tolerated the procedure well without apparent complication and was taken to recovery room in critical but stable condition with plans to admit to the intensive care unit.     Gabrielle Dare Janee Morn, M.D.     BET/MEDQ  D:  11/27/2010  T:  11/28/2010  Job:  [id number]  Electronically Signed by Violeta Gelinas M.D. on 12/05/2010 10:16:07 AM

## 2010-12-06 LAB — BASIC METABOLIC PANEL
CO2: 27 mEq/L (ref 19–32)
Calcium: 8 mg/dL — ABNORMAL LOW (ref 8.4–10.5)
Creatinine, Ser: 1.09 mg/dL (ref 0.4–1.2)
GFR calc Af Amer: 60 mL/min — ABNORMAL LOW (ref >60)
GFR calc non Af Amer: 49 mL/min — ABNORMAL LOW (ref >60)
Glucose, Bld: 132 mg/dL — ABNORMAL HIGH (ref 70–99)
Sodium: 142 mEq/L (ref 135–145)

## 2010-12-06 LAB — CBC
HCT: 28.2 % — ABNORMAL LOW (ref 36.0–46.0)
Hemoglobin: 9 g/dL — ABNORMAL LOW (ref 12.0–15.0)
MCH: 28.8 pg (ref 26.0–34.0)
MCHC: 31.9 g/dL (ref 30.0–36.0)
MCV: 90.1 fL (ref 78.0–100.0)
Platelets: 328 10*3/uL (ref 150–400)
RBC: 3.13 MIL/uL — ABNORMAL LOW (ref 3.87–5.11)
RDW: 16.1 % — ABNORMAL HIGH (ref 11.5–15.5)
WBC: 11 10*3/uL — ABNORMAL HIGH (ref 4.0–10.5)

## 2010-12-06 LAB — BRAIN NATRIURETIC PEPTIDE: Pro B Natriuretic peptide (BNP): 2262 pg/mL — ABNORMAL HIGH (ref 0.0–100.0)

## 2010-12-08 LAB — POCT I-STAT 3, ART BLOOD GAS (G3+)
Acid-base deficit: 2 mmol/L (ref 0.0–2.0)
Bicarbonate: 22.2 meq/L (ref 20.0–24.0)
O2 Saturation: 92 %
TCO2: 23 mmol/L (ref 0–100)
pCO2 arterial: 36.3 mmHg (ref 35.0–45.0)
pH, Arterial: 7.395 (ref 7.350–7.400)
pO2, Arterial: 65 mmHg — ABNORMAL LOW (ref 80.0–100.0)

## 2010-12-08 LAB — POCT ACTIVATED CLOTTING TIME: Activated Clotting Time: 393 seconds

## 2010-12-12 NOTE — Procedures (Signed)
[name], [name]                  ACCOUNT NO.:  [id number]  MEDICAL RECORD NO.:  [id number]           PATIENT TYPE:  I  LOCATION:  6533                         FACILITY:  [facility]  PHYSICIAN:  Thereasa Solo. Iniko Robles, M.D. DATE OF BIRTH:  31-Jul-1935  DATE OF PROCEDURE:  12/15/2010 DATE OF DISCHARGE:                           CARDIAC CATHETERIZATION   This 76 year old female was admitted with a perforated colon, had a colectomy.  Postoperatively, she had a non-Q-wave myocardial infarction. Once she was stable (about 10 days after her surgery), she was brought to the cath lab for elective cardiac catheterization prior to her discharge.  She had had previous bypass surgery in 2003 and had a recent stent placed to the ostial portion of the saphenous vein graft to the OM in November 2011.  After obtaining informed consent, the patient was prepped and draped in the usual sterile fashion, exposing the right groin.  The initial plan was for a left and right heart catheterization.  The right femoral artery was easily entered using the Seldinger technique, but I could never find the right femoral vein.  Because of this, decision was made to proceed on with a left heart catheterization and graft visualization.  Left and right coronary arteriography, graft visualization, ventriculography, and percutaneous intervention to the vein graft to the OM was then performed.  COMPLICATIONS:  None.  TOTAL CONTRAST:  160 mL.  EQUIPMENT:  5-French diagnostic catheters and all grafts were visualized with a JR-4 diagnostic catheter.  RESULTS: 1. Hemodynamic monitoring:  Her central aortic pressure was 137/68.     Her left ventricular pressure was 131/11 and the end-diastolic     pressure was 18. 2. Ventriculography:  Ventriculography in the RAO projection revealed     global hypokinesis with an ejection fraction of 40%.  The ejection     fraction was between 18 and 23.  CORONARY ARTERIOGRAPHY:  There is  calcification noted in the distribution of the left main and proximal LAD on fluoroscopy. 1. Left main:  There was terminal 50% of the narrowing of the left     main. 2. Circumflex:  There was ostial 60% narrowing of the circumflex and     the circumflex itself was completely occluded in the midportion. 3. LAD:  The LAD was patent but had a complex 70%-80% area in the     proximal segment.  There was bidirectional flow in the mid and     distal LAD.  There was a small diagonal that was visualized also.     There were left to right collaterals that visualized both the PDA     and the posterolateral vessels from the native LAD. 4. Right coronary artery 100% occluded in its midportion by cath on     November 2011.  GRAFTS: 1. Saphenous vein graft to the diagonal was 100% occluded and this was     noted on November 2011. 2. Saphenous vein graft to the RCA is 100% occluded and this is a new     occlusion since November 2011. 3. Internal mammary artery to the LAD.  The internal mammary  artery     was large, it inserted into the midportion of the LAD and there was     good flow in the mid and distal LAD.  There were no high-grade     stenosis, but at the apex of the heart as the LAD crossed, there     was a 60% area of narrowing.  Collaterals to the OM, the diagonal,     and the right coronary artery were all seen to come off of the LAD. 4. Vein graft to the OM.  There was a stent near the ostium of the     saphenous vein graft to the OM.  At the distal margin, there was an     area of 90% narrowing that was about 8 to 10 mm in length.  There     was a large valve in the midportion of the graft and the distal     vein graft was free of disease.  It inserted into OM #1 and this     was a relatively large vessel and free of disease.  Because of the high-grade stenosis in the vein graft to the OM, arrangements were made for intervention.  The system was upgraded to a 6- [place].  She was given  IV Angiomax, had an ACT well over 300, and was given 600 of oral Plavix at the end of the procedure.  A JR-4 guide catheter with side holes and a short Luge wire was used. The wire was easily passed down the vein graft into the OM system.  Predilatation was accomplished with a MINI TREK 2.0 x 15 balloon.  A single inflation of 13 atmospheres for 45 seconds was performed.  Stent:  A mini Vision stent 2.5 x 18 was placed in such a manner that it well overlapped the previous stent and extended well past the distal margin of the obstruction.  It was initially deployed at 15 atmospheres for 46 seconds and the final inflation was 16 atmospheres for 40 seconds.  Postdilatation was accomplished with an [place] TREK 2.75 x 15.  A single inflation of 14 atmospheres for 40 seconds making sure that the area of the overlap of the stents was well covered.  The areas that had been 90% pre-intervention now appeared to be normal with brisk TIMI 3 flow by pre and post procedure.  There was no evidence of dissection or thrombus or distal embolization.  The vessel was too small for a filter wire.  The patient will need to be on Plavix for a minimum of 3 months and hopefully for a year.  I gave her 20 mg of IV Lasix at the end of the procedure because of the 160 mL of contrast used.  She should be ready for discharge whenever she is felt to be ready by the surgeons.  Follow up in my office will be in about 3 weeks.          ______________________________ Thereasa Solo Ramar Nobrega, M.D.     ABL/MEDQ  D:  12/05/2010  T:  12/05/2010  Job:  [id number]  cc:   Arta Silence, MD  Electronically Signed by Julieanne Manson M.D. on 12/12/2010 82:95:62 PM

## 2010-12-17 NOTE — Discharge Summary (Signed)
NAMEVELECIA, OVITT                  ACCOUNT NO.:  [id number]  MEDICAL RECORD NO.:  [id number]           PATIENT TYPE:  I  LOCATION:  6533                         FACILITY:  [facility]  PHYSICIAN:  Currie Paris, M.D.DATE OF BIRTH:  November 02, 1934  DATE OF ADMISSION:  11/27/2010 DATE OF DISCHARGE:  12/06/2010                              DISCHARGE SUMMARY   CONSULTANTS ON THIS CASE:  [facility].  HISTORY OF PRESENT ILLNESS:  Ms. Menn is a 76 year old female who has a history of multiple medical problems including coronary artery disease, insulin-dependent diabetes, hypertension, hyperlipidemia who presented with complaint of abdominal pain and distention.  She did also have some fever and was evaluated in the emergency department.  Upon evaluation in the emergency department, a CT scan was ordered and found the patient have evidence of free air with concern for perforated sigmoid diverticulitis.  Decision was immediately made to admit the patient for operative intervention and inpatient management by Dr. Violeta Gelinas.  SUMMARY OF HOSPITAL COURSE:  The patient was admitted on November 27, 2010.  The patient was taken to the operating room and underwent exploratory laparotomy, sigmoid colectomy with Hartmann's pouch and end- colostomy.  The patient was sent to the ICU postoperatively.  The patient subsequently developed some postop atrial fibrillation and hypoxia.  She was seen by the critical care team in the intensive care unit for assisted management with postoperative respiratory failure and fever and the patient with multiple comorbidities.  The patient was subsequently reintubated secondary to residual anesthesia, but was felt that she could adequately be weaned.  However, due to the new onset of atrial fibrillation, cardiac enzymes were ordered, which showed an elevated troponin postoperatively.  The Cardiology Team was subsequently consulted and evaluated  her for paroxysmal atrial fibrillation and the possibility of acute ischemic event.  She was subsequently extubated and became more awake and alert and was improving from a respiratory standpoint.  Clinically, she did show improvement from a surgical aspect but continued with an NG tube and bowel rest for several postoperative days.  The patient was continued to be followed by Cardiology who subsequently determined the patient did have a postoperative MI.  As the patient's clinical progress improved, the decision was made that the patient would need cardiac catheterization for further evaluation.  She was set up for heart catheterization when clinically stable.  The patient was making good progress.  She was tolerating diet beginning with liquids and slowly advanced to solids.  Her ostomy began functioning very well.  She had no additional postsurgical complications in the form of wound infection or ostomy ischemia.  The Cardiology Team subsequently set up cardiac catheterization, which was carried out on December 05, 2010.  The patient did have a new stent and angioplasty performed.  However, she has tolerated this well and is doing well on postoperative day #9.  After discussion with Cardiology and review of her medications, it has been determined that the patient is stable for discharge home.  Home Health arrangements have been made with nursing and wound care.  The patient is agreeable  to discharge home.  DISCHARGE DIAGNOSES: 1. Perforated sigmoid diverticulitis status post Hartmann procedure     with end-colostomy. 2. Non-ST elevation myocardial infarction status post cardiac     catheterization and percutaneous transluminal coronary angioplasty. 3. Paroxysmal atrial fibrillation now maintained sinus rhythm. 4. Postoperative ileus resolved. 5. Hypokalemia resolved. 6. Insulin-dependent diabetes mellitus. 7. Coronary artery disease.  Discharge medications will be as follows: 1.  The patient is asked to increase her aspirin from 81 mg to 325 mg     on a daily basis. 2. She is given a prescription for Vicodin 1 tablet q.6 h. p.r.n.     severe pain. 3. She is to resume Actos 45 mg daily. 4. Atorvastatin 20 mg daily. 5. Diltiazem 90 mg daily. 6. Fish oil 1200 mg daily. 7. Glyburide/metformin 5/500 two tablets q.a.m. is to be resumed on     December 08, 2010. 8. Metformin 500 mg at bedtime to be resumed on December 08, 2010. 9. She is on a Humalog insulin sliding scale at home and as directed. 10.She can take meclizine 25 mg 3 times daily p.r.n. 11.Plavix 75 mg daily. 12.Betapace 800 mg 1/2 tablet twice daily. 13.Vitamin B12 daily. 14.Vitamin D twice daily. 15.Zetia 10 mg daily.  Previous home medications to be discontinued include isosorbide, triamterene/hydrochlorothiazide, and benazepril.  DISCHARGE RECOMMENDATIONS:  The patient has home health arranged for wound care and ostomy instruction.  She is instructed to follow up with Dr. Violeta Gelinas in approximately 7-10 days for reevaluation and staple removal.  Once she is clinically improving from her postsurgical recovery, she is to resume cardiac rehab as directed by her cardiologist.  She will be contacted by [facility] for followup appointments.     Brayton El, PA-C   ______________________________ Currie Paris, M.D.    KB/MEDQ  D:  12/06/2010  T:  12/07/2010  Job:  [id number]  Electronically Signed by Brayton El  on 12/16/2010 11:00:49 AM Electronically Signed by Cyndia Bent M.D. on 12/17/2010 01:08:28 PM

## 2010-12-22 LAB — BASIC METABOLIC PANEL
BUN: 27 mg/dL — ABNORMAL HIGH (ref 6–23)
CO2: 26 mEq/L (ref 19–32)
Calcium: 9.1 mg/dL (ref 8.4–10.5)
Chloride: 105 mEq/L (ref 96–112)
Creatinine, Ser: 1.19 mg/dL (ref 0.4–1.2)
GFR calc Af Amer: 54 mL/min — ABNORMAL LOW (ref >60)
GFR calc non Af Amer: 45 mL/min — ABNORMAL LOW (ref >60)
Glucose, Bld: 123 mg/dL — ABNORMAL HIGH (ref 70–99)
Potassium: 4.1 mEq/L (ref 3.5–5.1)
Sodium: 138 mEq/L (ref 135–145)

## 2010-12-22 LAB — GLUCOSE, CAPILLARY: Glucose-Capillary: 125 mg/dL — ABNORMAL HIGH (ref 70–99)

## 2010-12-23 LAB — BASIC METABOLIC PANEL
BUN: 26 mg/dL — ABNORMAL HIGH (ref 6–23)
CO2: 25 mEq/L (ref 19–32)
Calcium: 9 mg/dL (ref 8.4–10.5)
Chloride: 105 mEq/L (ref 96–112)
Creatinine, Ser: 1.33 mg/dL — ABNORMAL HIGH (ref 0.4–1.2)
GFR calc Af Amer: 47 mL/min — ABNORMAL LOW (ref >60)
GFR calc non Af Amer: 39 mL/min — ABNORMAL LOW (ref >60)
Glucose, Bld: 176 mg/dL — ABNORMAL HIGH (ref 70–99)
Potassium: 4.1 mEq/L (ref 3.5–5.1)
Sodium: 135 mEq/L (ref 135–145)

## 2010-12-23 LAB — GLUCOSE, CAPILLARY
Glucose-Capillary: 101 mg/dL — ABNORMAL HIGH (ref 70–99)
Glucose-Capillary: 110 mg/dL — ABNORMAL HIGH (ref 70–99)
Glucose-Capillary: 140 mg/dL — ABNORMAL HIGH (ref 70–99)
Glucose-Capillary: 143 mg/dL — ABNORMAL HIGH (ref 70–99)
Glucose-Capillary: 184 mg/dL — ABNORMAL HIGH (ref 70–99)
Glucose-Capillary: 86 mg/dL (ref 70–99)

## 2010-12-23 LAB — CARDIAC PANEL(CRET KIN+CKTOT+MB+TROPI)
CK, MB: 1.7 ng/mL (ref 0.3–4.0)
CK, MB: 1.7 ng/mL (ref 0.3–4.0)
Relative Index: INVALID (ref 0.0–2.5)
Relative Index: INVALID (ref 0.0–2.5)
Total CK: 61 U/L (ref 7–177)
Total CK: 68 U/L (ref 7–177)
Troponin I: 0.08 ng/mL — ABNORMAL HIGH (ref 0.00–0.06)
Troponin I: 0.1 ng/mL — ABNORMAL HIGH (ref 0.00–0.06)

## 2010-12-23 LAB — CBC
HCT: 31.5 % — ABNORMAL LOW (ref 36.0–46.0)
Hemoglobin: 10.7 g/dL — ABNORMAL LOW (ref 12.0–15.0)
MCHC: 34.1 g/dL (ref 30.0–36.0)
MCV: 92.3 fL (ref 78.0–100.0)
Platelets: 241 10*3/uL (ref 150–400)
RBC: 3.41 MIL/uL — ABNORMAL LOW (ref 3.87–5.11)
RDW: 14.8 % (ref 11.5–15.5)
WBC: 9.9 10*3/uL (ref 4.0–10.5)

## 2010-12-23 LAB — PLATELET COUNT: Platelets: 248 10*3/uL (ref 150–400)

## 2011-01-05 ENCOUNTER — Other Ambulatory Visit (INDEPENDENT_AMBULATORY_CARE_PROVIDER_SITE_OTHER): Payer: Medicare Other | Admitting: Family Medicine

## 2011-01-05 DIAGNOSIS — D649 Anemia, unspecified: Secondary | ICD-10-CM

## 2011-01-05 DIAGNOSIS — E7800 Pure hypercholesterolemia, unspecified: Secondary | ICD-10-CM

## 2011-01-05 DIAGNOSIS — I10 Essential (primary) hypertension: Secondary | ICD-10-CM

## 2011-01-05 DIAGNOSIS — E119 Type 2 diabetes mellitus without complications: Secondary | ICD-10-CM

## 2011-01-05 DIAGNOSIS — E559 Vitamin D deficiency, unspecified: Secondary | ICD-10-CM

## 2011-01-05 LAB — COMPREHENSIVE METABOLIC PANEL
ALT: 10 U/L (ref 0–35)
AST: 18 U/L (ref 0–37)
Albumin: 3.8 g/dL (ref 3.5–5.2)
Alkaline Phosphatase: 49 U/L (ref 39–117)
BUN: 35 mg/dL — ABNORMAL HIGH (ref 6–23)
CO2: 24 mEq/L (ref 19–32)
Calcium: 9.5 mg/dL (ref 8.4–10.5)
Chloride: 102 mEq/L (ref 96–112)
Creatinine, Ser: 0.9 mg/dL (ref 0.4–1.2)
GFR: 65.99 mL/min (ref >60.00)
Glucose, Bld: 84 mg/dL (ref 70–99)
Potassium: 4.9 mEq/L (ref 3.5–5.1)
Sodium: 136 mEq/L (ref 135–145)
Total Bilirubin: 0.5 mg/dL (ref 0.3–1.2)
Total Protein: 6.3 g/dL (ref 6.0–8.3)

## 2011-01-05 LAB — CBC WITH DIFFERENTIAL/PLATELET
Basophils Absolute: 0 10*3/uL (ref 0.0–0.1)
Basophils Relative: 0.4 % (ref 0.0–3.0)
Eosinophils Absolute: 0.3 10*3/uL (ref 0.0–0.7)
Eosinophils Relative: 3.4 % (ref 0.0–5.0)
HCT: 32.7 % — ABNORMAL LOW (ref 36.0–46.0)
Hemoglobin: 10.9 g/dL — ABNORMAL LOW (ref 12.0–15.0)
Lymphocytes Relative: 18.9 % (ref 12.0–46.0)
Lymphs Abs: 1.7 10*3/uL (ref 0.7–4.0)
MCHC: 33.4 g/dL (ref 30.0–36.0)
MCV: 92.7 fl (ref 78.0–100.0)
Monocytes Absolute: 0.7 10*3/uL (ref 0.1–1.0)
Monocytes Relative: 8.1 % (ref 3.0–12.0)
Neutro Abs: 6.3 10*3/uL (ref 1.4–7.7)
Neutrophils Relative %: 69.2 % (ref 43.0–77.0)
Platelets: 299 10*3/uL (ref 150.0–400.0)
RBC: 3.53 Mil/uL — ABNORMAL LOW (ref 3.87–5.11)
RDW: 16.3 % — ABNORMAL HIGH (ref 11.5–14.6)
WBC: 9.1 10*3/uL (ref 4.5–10.5)

## 2011-01-05 LAB — MICROALBUMIN / CREATININE URINE RATIO
Creatinine,U: 70.3 mg/dL
Microalb Creat Ratio: 6.3 mg/g (ref 0.0–30.0)
Microalb, Ur: 4.4 mg/dL — ABNORMAL HIGH (ref 0.0–1.9)

## 2011-01-05 LAB — LIPID PANEL
Cholesterol: 129 mg/dL (ref 0–200)
HDL: 34.3 mg/dL — ABNORMAL LOW (ref >39.00)
LDL Cholesterol: 64 mg/dL (ref 0–99)
Total CHOL/HDL Ratio: 4
Triglycerides: 154 mg/dL — ABNORMAL HIGH (ref 0.0–149.0)
VLDL: 30.8 mg/dL (ref 0.0–40.0)

## 2011-01-05 LAB — TSH: TSH: 5.11 u[IU]/mL (ref 0.35–5.50)

## 2011-01-06 LAB — VITAMIN D 25 HYDROXY (VIT D DEFICIENCY, FRACTURES): Vit D, 25-Hydroxy: 54 ng/mL (ref 30–89)

## 2011-01-08 ENCOUNTER — Encounter: Payer: Self-pay | Admitting: Family Medicine

## 2011-01-08 ENCOUNTER — Ambulatory Visit (INDEPENDENT_AMBULATORY_CARE_PROVIDER_SITE_OTHER): Payer: Medicare Other | Admitting: Family Medicine

## 2011-01-08 DIAGNOSIS — M810 Age-related osteoporosis without current pathological fracture: Secondary | ICD-10-CM

## 2011-01-08 DIAGNOSIS — E119 Type 2 diabetes mellitus without complications: Secondary | ICD-10-CM

## 2011-01-08 DIAGNOSIS — I2510 Atherosclerotic heart disease of native coronary artery without angina pectoris: Secondary | ICD-10-CM

## 2011-01-08 DIAGNOSIS — I10 Essential (primary) hypertension: Secondary | ICD-10-CM

## 2011-01-08 DIAGNOSIS — E785 Hyperlipidemia, unspecified: Secondary | ICD-10-CM

## 2011-01-08 DIAGNOSIS — E559 Vitamin D deficiency, unspecified: Secondary | ICD-10-CM

## 2011-01-08 DIAGNOSIS — I4891 Unspecified atrial fibrillation: Secondary | ICD-10-CM

## 2011-01-08 DIAGNOSIS — D509 Iron deficiency anemia, unspecified: Secondary | ICD-10-CM

## 2011-01-08 NOTE — Assessment & Plan Note (Signed)
Very regular today.

## 2011-01-08 NOTE — Assessment & Plan Note (Addendum)
LDL marvelous, others "acceptable" Cont as is. Lab Results  Component Value Date   CHOL 129 01/05/2011   CHOL 152 07/21/2010   CHOL 149 12/02/2009   Lab Results  Component Value Date   HDL 34.30* 01/05/2011   HDL 16.10* 07/21/2010   HDL 47.90 12/02/2009   Lab Results  Component Value Date   LDLCALC 64 01/05/2011   LDLCALC 88 07/21/2010   LDLCALC 72 12/02/2009   Lab Results  Component Value Date   TRIG 154.0* 01/05/2011   TRIG 133.0 07/21/2010   TRIG 147.0 12/02/2009   Lab Results  Component Value Date   CHOLHDL 4 01/05/2011   CHOLHDL 4 07/21/2010   CHOLHDL 3 12/02/2009

## 2011-01-08 NOTE — Assessment & Plan Note (Signed)
Per Dr Clarene Duke.

## 2011-01-08 NOTE — Assessment & Plan Note (Signed)
Great job. A1C stable. Acute glu great. Micro alb nml. Lab Results  Component Value Date   HGBA1C 7.3* 01/05/2011  .

## 2011-01-08 NOTE — Assessment & Plan Note (Signed)
Minimally therapeutic, start OTC repl.

## 2011-01-08 NOTE — Progress Notes (Signed)
  Subjective:    Patient ID: [name], female    DOB: 03/05/1935, 76 y.o.   MRN: [id number]  HPI Pt here for Comp Exam. She has recently been in the hospital for new onset diverticulitis with perforation and temporary colostomy. She also has recently been seen by Dr Clarene Duke who has restarted some of her BP meds as she was taken off most due to being in the hospital and post surgery. She has done well and feels good. She is looking forward to the take down procedure. Her Diabetes was carefully followed in the hosp and was well controlled.   Review of Systems  Constitutional: Negative for fever, chills, diaphoresis, fatigue and unexpected weight change.  HENT: Negative for hearing loss, ear pain, rhinorrhea, trouble swallowing and tinnitus.   Eyes: Negative for pain, discharge and visual disturbance.  Respiratory: Negative for cough, shortness of breath and wheezing.   Cardiovascular: Negative for chest pain, palpitations and leg swelling.       [No Fainting or Fatigue. Gastrointestinal: Negative for nausea, vomiting, abdominal pain, diarrhea, constipation and blood in stool.       [No Heartburn Genitourinary: Negative for dysuria and frequency.  Musculoskeletal: Negative for myalgias, back pain and arthralgias.  Skin: Negative for rash.       [No Itching or Dryness. Neurological: Negative for tremors and numbness.       [No Tingling. No Balance Problems but chronic mild instability.. Hematological: Negative for adenopathy. Bruises/bleeds easily.  Psychiatric/Behavioral: Negative for dysphoric mood and agitation.       Objective:   Physical Exam  Constitutional: She is oriented to person, place, and time. She appears well-developed and well-nourished. No distress.  HENT:  Head: Normocephalic and atraumatic.  Right Ear: External ear normal.  Left Ear: External ear normal.  Nose: Nose normal.  Mouth/Throat: Oropharynx is clear and moist.  Eyes: Conjunctivae and EOM are normal.  Pupils are equal, round, and reactive to light. No scleral icterus.  Neck: Normal range of motion. Neck supple. No thyromegaly present.  Cardiovascular: Normal rate, regular rhythm, normal heart sounds and intact distal pulses.   No murmur heard. Pulmonary/Chest: Effort normal and breath sounds normal. No respiratory distress. She has no wheezes.       Breasts NT w/o mass, nipples inverted bilat (chronic) no axillary adenop.  Abdominal: Soft. Bowel sounds are normal. She exhibits no mass. There is no tenderness.  Genitourinary:       Exam not done.   Musculoskeletal: Normal range of motion.       Back Nontender in the Midline  Lymphadenopathy:    She has no cervical adenopathy.  Neurological: She is alert and oriented to person, place, and time. She exhibits normal muscle tone. Coordination normal.  Skin: Skin is warm and dry. No rash noted. She is not diaphoretic.  Psychiatric: She has a normal mood and affect. Her behavior is normal. Judgment and thought content normal.          Assessment & Plan:  Health Maintenance Exam See below

## 2011-01-08 NOTE — Patient Instructions (Signed)
RTC 6 mos, A1C prior. 

## 2011-01-08 NOTE — Assessment & Plan Note (Signed)
Excellent control. Is back on her ACEI, cont as is.

## 2011-01-08 NOTE — Assessment & Plan Note (Signed)
Add Vit D and get back on Tums for calcium repl.

## 2011-01-08 NOTE — Assessment & Plan Note (Signed)
Continues mildly anemic in RBCs but stable. Cont as is.

## 2011-01-14 ENCOUNTER — Other Ambulatory Visit: Payer: Self-pay | Admitting: Family Medicine

## 2011-01-20 ENCOUNTER — Other Ambulatory Visit: Payer: Self-pay | Admitting: Family Medicine

## 2011-01-29 ENCOUNTER — Other Ambulatory Visit: Payer: Self-pay | Admitting: Family Medicine

## 2011-02-04 ENCOUNTER — Encounter: Payer: Self-pay | Admitting: *Deleted

## 2011-02-04 ENCOUNTER — Other Ambulatory Visit: Payer: Self-pay | Admitting: Family Medicine

## 2011-02-17 NOTE — Discharge Summary (Signed)
NAMESOMMER, SPICKARD                  ACCOUNT NO.:  [id number]   MEDICAL RECORD NO.:  [id number]          PATIENT TYPE:  INP   LOCATION:  3713                         FACILITY:  [facility]   PHYSICIAN:  Thereasa Solo. Little, M.D. DATE OF BIRTH:  05/04/1935   DATE OF ADMISSION:  03/11/2007  DATE OF DISCHARGE:  03/15/2007                               DISCHARGE SUMMARY   DISCHARGE DIAGNOSES:  1. Subendocardial myocardial infarction with acute coronary syndrome.  2. Coronary artery disease with a history of coronary artery bypass      grafting.  A 99% stenosis of the saphenous vein graft to the circ.      Reducing a 99% stenosis to 0.  She had residual disease below the      saphenous vein graft insertion site on the right of an 80% stenosis      but no small vessel.  She had EF of 45%.  3. Hypertension was stable.  4. Atrial fibrillation, on admission, converted to sinus rhythm with      one episode during hospitalization.  5. Hyperlipidemia.  6. Diabetes mellitus.  7. Peripheral vascular disease.  8. History of breast cancer in 2000 with lumpectomy.  9. Blood loss anemia.   DISCHARGE CONDITION:  Improved.   PROCEDURES:  Combined left heart cath and graft visualization by Dr.  Nanetta Batty on March 11, 2007.   Drug-eluting stent to the saphenous vein graft to the OM by Dr. Allyson Sabal on  March 11, 2007.   DISCHARGE MEDICATIONS:  1. Plavix 75 mg daily.  Do not stop, you could have a heart attack.  2. Actos 45 mg daily.  3. Zocor 70 mg every evening.  4. Metoprolol 75 mg twice a day.  5. Imdur 30 mg daily.  6. Glucophage/Glyburide 500/5 two tabs in the morning.  7. Glucophage 500 mg every evening.  8. Triamterene HCT 37.5/25 daily.  9. Aspirin 81 mg daily.  10.Cardizem CD 120 mg daily beginning today.  11.Lotensin 10 mg daily.  12.Nitroglycerin sublingual p.r.n. chest pain.  13.Have lab work done March 28, 2007.  14.Stop taking Lotrel.   DISCHARGE INSTRUCTIONS:  1. Increase activity  slowly.  2. May walk up steps.  3. May shower or bathe.  4. No lifting for two weeks.  5. No driving for 2 weeks.  6. Wash right groin cath site with soap and water.  Call if any      bleeding, swelling or drainage.  7. Follow up with Dr. Clarene Duke in the office.  The office will call you      with the date and time of that appointment.   HISTORY OF PRESENT ILLNESS:  A 76 year old female patient of Dr.  Fredirick Maudlin and Dr. Hetty Ely with known history of coronary disease and  previous bypass grafting, presented to the emergency room with a racing  heart rate on March 11, 2007.  Onset was at 3:30 a.m.  She also  experienced mid chest pain without radiation and mild shortness of  breath.  She came to the emergency room, had Cardizem bolus, slowed her  heart rate down.  Drip started in the emergency room, and she converted  to sinus rhythm.   PAST MEDICAL HISTORY:  1. Bypass as stated.  EF has been normal in the past.  2. Hypertension.  3. Hyperlipidemia.  4. Diabetes.  5. Peripheral vascular disease with a history of left iliac stent.  6. Also, history of breast cancer status post lumpectomy and      radiation.   Social history, family history and review of systems, see H&P.   PHYSICAL EXAMINATION ON DISCHARGE:  Blood pressure 108/60, pulse 71,  respiratory rate 20, temperature 97.1.  LUNGS:  Clear.   HEART:  Sounds S1-S2, right groin cath site stable.   LABORATORY DATA:  At the time of discharge, hemoglobin was 9.6.  On  admission, hemoglobin had been 11.9, hematocrit 35.6, WBC 10.6,  platelets 291, neutrophils 69, lymphs 18, mono 10, eos 2, basos 0.  These remained slowly decreased over the course of the hospitalization  with results as previously stated.   Pro time on admission 12.5, INR 0.9, PTT 38.   Chemistry, sodium 136, potassium 3.9, chloride 106, CO2 22, glucose 122,  BUN 36, creatinine 1.45.  At time of discharge, creatinine was stable at  1.3.   Total protein 7,  albumin 3.7, AST 29, ALT 15, ALP 50, total bili 0.5.   Glycohemoglobin was elevated slightly at 7.0.   Cardiac enzymes.  CKs range 174, 352, 217, 171, 106.  MBs 10.2.  Peak  was 31.7, 12.6, 8.3.  Troponin I started at 3.30 and peak was 12.59.   Cholesterol 149, triglycerides 155, HDL 41 and LDL 77.   TSH initially was elevated at 6.679 on recheck.  TSH was 3.19 and T4 was  11.9, T3 was 161.   Chest X-Ray:  On admission, cardiomegaly without evidence of acute  cardiopulmonary disease.  EKGs initially atrial fib with rapid  ventricular response, nonspecific ST-T wave changes.   On June 9, she did have some deep T-wave depressions in her anterior  leads hat were not present on her admission on March 11, 2007.   HOSPITAL COURSE:  The patient was admitted by Dr. Clarene Duke on March 11, 2007, with subendocardial MI and underwent cardiac catheterization on  June 6, with results as previously stated.  She stabilized.  EKG was  stable.  On June 9, she was having bursts of PAF.  Originally was in  atrial fibrillation on admission and converted.  She had a few other  episodes of more bursts of PAF.  It was felt not to start her on  Coumadin as she was on aspirin and Plavix already for drug-eluting  stent.  Dr. Clarene Duke did not want to add Coumadin at this time.  She was  stabilize, was ambulating without problems and felt well.  By March 15, 2007, she was stable, ready for discharge home.  Again, will hold the  Coumadin, especially with her anemia.  Will follow-up on her hemoglobin  as an outpatient.  Dr. Clarene Duke saw her and examined her and she was  stable and ready for discharge home in sinus rhythm.      Darcella Gasman. [place], N.P.    ______________________________  Thereasa Solo Little, M.D.    LRI/MEDQ  D:  03/15/2007  T:  03/15/2007  Job:  [id number]   cc:   Thereasa Solo. Little, M.D.  Arta Silence, MD

## 2011-02-17 NOTE — Cardiovascular Report (Addendum)
NAMEAZARIYAH, LUHRS NO.:  [id number]   MEDICAL RECORD NO.:  [id number]          PATIENT TYPE:  INP   LOCATION:  2807                         FACILITY:  [facility]   PHYSICIAN:  Nanetta Batty, M.D.   DATE OF BIRTH:  1934/11/29   DATE OF PROCEDURE:  03/11/2007  DATE OF DISCHARGE:                            CARDIAC CATHETERIZATION   ____ QA MARKER: 14                                                 [name] is a 77 year old female, 5 years status post coronary artery  bypass grafting.  She was cathed by Dr. Clarene Duke January 13, 2007 and found  to have a totally occluded vein graft.  Her LIMA to LAD was patent, the  vein graft to the OM branch had a 50-60% segmental proximal stenosis,  and the vein graft to the PDA was patent.  She had collaterals to her  diagonal branch and was treated medically.  She came in this morning  with AFib with RVR and chest pain.  She converted to sinus rhythm in the  ER.  Enzymes were positive.  She was evaluated by Dr. Clarene Duke who felt  cardiac catheterization was warranted.  She presents now for that  procedure.   DESCRIPTION OF PROCEDURE:  The patient was brought to the second floor,  [facility] cardiac cath lab, in postabsorptive state.  She was  premedicated with p.o. Valium.  Right groin was prepped and shaved in  the usual sterile fashion.  One percent Xylocaine was used for local  anesthesia.  A 6-French sheath was inserted into the right femoral  artery using standard Seldinger technique.  A 6-French right and left  Judkins diagnostic catheter, as well as a 6 French pigtail catheter were  used for selective coronary angiography, left ventriculography,  selective LIMA angiography, and selective vein graft angiography.  Visipaque dye was used for entirety of the case.  Retrograde aorta, left  ventricular pressures were recorded.   ANGIOGRAPHIC RESULTS:  1. The left main; the left main had 50-60% distal taper stenosis.  2. LAD; LAD  had an 80% fairly proximal stenosis with competitive flow      distally.  The diagonal branch was functionally occluded.  3. Circumflex; occluded in the proximal portion of the AV groove.  4. Right coronary; dominant vessel with occlusion at the genu.  5. Vein graft to the PDA; widely patent to a small PDA.  There was 80%      segmental stenosis after the vein graft insertion.  There was also      90% stenosis in the proximal portion of PLA-1.  There were right-to-      left collaterals to the distal circumflex.  6. Vein graft to diagonal occluded at the origin.  7. LIMA to the LAD widely patent with 70% segmental stenosis in the      LAD after LIMA insertion  which is unchanged from the prior cath.      There were left-to-left collaterals to diagonal branch.  8. Vein graft to the circumflex marginal branch, 99% proximal stenosis      with TIMI I  half flow.  9. Left ventriculography; RAO and LAO left ventriculograms were      performed using 20 mL of Visipaque dye at 10 mL per second in each      view.  There was moderate anterolateral hypokinesia in the RAO view      and moderate inferoposterolateral hypokinesia in the LAO view.  The      EF was estimated visually at approximate 45%.   IMPRESSION:  Ms. Mccannon has a new circumflex distribution infarct related  to subtotal occlusion of the marginal branch vein graft with a wall  motion abnormality and positive enzymes.  Will proceed with PCI and  stenting using TIMI III inhibitor and drug-eluting stent.   The patient received four baby aspirin in the ER.  She was on Plavix at  home.  I gave her an additional 300 mg of p.o. Plavix.  She received  3500 units of heparin intravenously with an ACT of greater than 300 as  well as Integrilin double bolus and infusion.   Using a 7-French system (JR-4) guide catheter along with 0.014 195 soft  wire and a 2 x 12 Maverick, angioplasty was performed.  The wire and  balloon passed with difficulty  suggesting the lesion was extremely  tight.  There was 200 mcg intragraft nitroglycerin was then  administered.  There was excellent flow with no evidence of no reflow.  Stenting was then performed using a 2.5 x 18 Cypher drug-eluting stent  at 14 atmospheres (2.6 mm) resulting in reduction of 99% subtotally  occluded circ obtuse marginal branch vein graft with TIMI I half flow to  0% residual TIMI III flow.  The marginal branch remained intact without  evidence of distal embolization.  The patient remained hemodynamically  and electrocardiographically stable throughout the procedure.   IMPRESSION:  Successful percutaneous coronary intervention and stenting  using Integrilin and Cypher drug-eluting stent of a subtotally occluded  circ marginal vein graft.  The patient tolerated the procedure well.  The guidewire and catheter were removed.  The sheath was sewn securely  in place.  The patient left the lab in stable condition.  She will  reside in the TCU over the weekend.  Her enzymes will be cycled.  Heparin was discontinued.  Integrilin will continue for 18 hours.  The  sheath will be removed once the ACT falls below 180.  If the patient  remains clinically stable, she will be discharged home on medical  therapy in approximately 72 hours.      Nanetta Batty, M.D.  Electronically Signed     JB/MEDQ  D:  03/11/2007  T:  03/11/2007  Job:  [id number]   cc:   [facility]  Thereasa Solo. Little, M.D.

## 2011-02-20 NOTE — Op Note (Signed)
NAME:  [name], [name]                            ACCOUNT NO.:  [id number]   MEDICAL RECORD NO.:  [id number]                   PATIENT TYPE:  AMB   LOCATION:  DSC                                  FACILITY:  [facility]   PHYSICIAN:  Lubertha Basque. Jerl Santos, M.D.             DATE OF BIRTH:  03/24/1935   DATE OF PROCEDURE:  04/03/2003  DATE OF DISCHARGE:                                 OPERATIVE REPORT   PREOPERATIVE DIAGNOSES:  1. Right shoulder rotator cuff tear.  2. Right shoulder impingement.  3. Right shoulder acromioclavicular spur.   POSTOPERATIVE DIAGNOSES:  1. Right shoulder rotator cuff tear.  2. Right shoulder impingement.  3. Right shoulder acromioclavicular spur.   PROCEDURE:  1. Right shoulder arthroscopic debridement.  2. Right shoulder arthroscopic acromioplasty.  3. Right shoulder arthroscopic partial claviculectomy.   ANESTHESIA:  General and block.   SURGEON:  Lubertha Basque. Jerl Santos, M.D.   ASSISTANT:  Lindwood Qua, P.A.   INDICATIONS FOR PROCEDURE:  The patient is a 76 year old woman with a long  history of right shoulder pain and stiffness.  This has persisted despite  oral anti-inflammatories, rest, an exercise program and an injection.  She  had an MRI scan which showed a massive rotator cuff tear and things  consistent with impingement and AC spurring.  She is offered an arthroscopy.  An informed operative consent was obtained, after a discussion of possible  complications of, reaction to anesthesia and infection.   DESCRIPTION OF PROCEDURE:  The patient was taken to the operating suite  where a general anesthetic was applied without difficulty.  She also  received a block in the anesthesia area.  Before the case, I had performed a  manipulation and took her forward flexion from 90 to 150, with several  audible pops along the way.  She was prepped and draped in the normal  sterile fashion.  After the administration of preoperative IV antibiotics,  an arthroscopy  of the right shoulder was performed through a total of two  portals.  The glenohumeral joint showed minimal degenerative change.  The  biceps tendon appeared intact.  She did have a massive rotator cuff tear,  which was retracted.  I performed a debridement of this structure back to  the level of the glenoid.  Her biceps tendon was left intact.  She had a  subacromial morphology which was unfavorable, and an acromioplasty was done  with a bur in the lateral position, followed by a transfer of the bur to the  posterior position.  I also made a generous resection of some spurs under  the [facility] joint, without a formal ACD compression.  This partial clavicectomy  seemed to remove a lot of the impinging structures.  The shoulder was  thoroughly irrigated, followed by the placement of Marcaine with epinephrine  and morphine.  Simple sutures of nylon were used to loosely reapproximate  the  portals, followed by Adaptic and a dry gauze dressing and tape.  The estimated blood loss and intraoperative fluids can be obtained from the  anesthesia records.   DISPOSITION:  The patient was extubated in the operating room and taken to  the recovery room in stable condition.   PLAN:  For her to go home the same day, and to follow up in the office in  less than one week.  I will contact her by phone tonight.                                                Lubertha Basque Jerl Santos, M.D.    PGD/MEDQ  D:  04/03/2003  T:  04/03/2003  Job:  [id number]

## 2011-02-20 NOTE — Op Note (Signed)
[place]. [facility]  Patient:    LELAH RENNAKER                          MRN: [id number] Proc. Date: 09/02/99 Adm. Date:  [id number] Attending:  Katha Cabal CC:         Evelene Croon, M.D. [facility] B. Daphine Deutscher, M.D.                           Operative Report  CCS# [id number]  PREOPERATIVE DIAGNOSIS:  POSTOPERATIVE DIAGNOSIS:  OPERATION PERFORMED:  Needle localized right breast biopsy.  SURGEON:  Thornton Park. Daphine Deutscher, M.D.  ASSISTANT:  ANESTHESIA:  MAC.  INDICATIONS FOR PROCEDURE:  Ms. Rehmann is a 76 year old lady from [place] who had recent mammograms done at [facility] that showed some new microcalcifications in her right breast.  She was seen in the office on August 20, 1999.  I discussed her options and I felt that this needed to be needle localized and excised.  She is brought in at this time for that procedure.  DESCRIPTION OF PROCEDURE:  Ms. Lamica was taken to operating room #3 after Dr. Threasa Beards placed two wires above and below the area of microcalcifications.  I then made n incision after prepping the area with Betadine and draping sterilely and infiltrating the area with 1% lidocaine.  I made an incision between these two wires and went and created skin flaps and essentially excised everything between the two wires getting deep to these.  This in essence removed all of the microcalcifications.  Report from Dr. Threasa Beards that there was a 1 cm margin.  This was sent for permanent section as we had been concerned that this might represent ductal carcinoma in situ.  The patient was aware of that preoperatively. However, we will wait for the permanent sections to make that call.  The area was irrigated with saline as well as with lidocaine.  There were some bleeders controlled with figure-of-eight sutures of 4-0 Vicryl.  The wound was hen closed with 4-0 Vicryl subcutaneously, 5-0  Vicryl subcuticularly and because of her allergy to tape, I placed 5-0 Prolene interrupted simple sutures in the skin to  complete the approximation.  A sterile dressing was applied.  The patient will e given Tylox to take as needed for pain and will be followed up in the office within the week. DD:  09/02/99 TD:  09/03/99 Job: [id number]

## 2011-02-20 NOTE — Discharge Summary (Signed)
[place]. [facility]  Patient:    [name], [name] Visit Number: [id number] MRN: [id number]          Service Type: SUR Location: 2000 2017 01 Attending Physician:  Mikey Bussing Dictated by:   Dominica Severin, P.A. Admit Date:  02/09/2002 Discharge Date: 02/16/2002   CC:         Julieanne Manson, M.D.  Laurita Quint, M.D. [facility]  Billie Lade, M.D.   Discharge Summary  DATE OF BIRTH:  1935/06/17  ADMISSION DIAGNOSES: 1. Coronary artery disease. 2. Type 2 diabetes mellitus. 3. Hyperlipidemia. 4. Hypertension. 5. Peripheral vascular disease, status post left femoral angiogram and stent    placement in 2000. 6. History of breast cancer.  DISCHARGE DIAGNOSES: 1. Coronary artery disease, status post coronary artery bypass graft surgery    x4. 2. Postoperative anemia secondary to acute blood loss. 3. Sclerotic bone lesions of the thoracic spine found on computed tomography    scan of the chest preoperatively.  PROCEDURE:  Coronary artery bypass graft surgery x4 on Feb 09, 2002, by Dr. Donata Clay with the following grafts placed:  Left internal mammary artery to the left anterior descending artery, saphenous vein graft to the obtuse marginal branch, saphenous vein graft to the diagonal branch, saphenous vein graft to the posterior descending branch.  HISTORY OF PRESENT ILLNESS:  The patient is a 76 year old, Caucasian, type2 diabetic who was seen in the office for evaluation of coronary vascularization.  She was recently found to have three-vessel coronary artery disease.  She had a history of dyspnea on exertion for the past several weeks. She had a stress test two years ago which showed ischemia versus scar. Because of her other risk factors, she was scheduled for a cardiac catheterization by Dr. Clarene Duke which revealed the three-vessel coronary artery disease and it was felt that she would benefit from surgical  coronary vascularization.  HOSPITAL COURSE:  The patient underwent the surgery as stated above on Feb 09, 2002.  She tolerated the procedure well.  She was transported to the SICU in stable condition.  Later that evening, she was extubated.  She was hemodynamically stable.  Her hematocrit was 30%.  On postop day #1, she continued to remain hemodynamically stable.  Her mediastinal tubes were discontinued later that day due to increased output of 240 cc over the previous eight hours.  Nevertheless, she was kept on the ICU for one more day. She was diuresed.  She began ambulated without difficulty.  She was tolerating a regular diet.  She had a stable rhythm throughout her stay in sinus rhythm. Although, later in her stay, she did develop some sinus tachycardia.  This was felt possibly related to some postoperative anemia secondary to acute blood loss with a hemoglobin 8.5 and 24.3.  Therefore, she was started on Niferex 150 mg once a day.  In addition, her Lopressor was increased to 25 mg p.o. b.i.d.  From a pulmonary standpoint, she was doing well with tolerating room air oxygen with saturation rates of 96%.  She was ambulating daily with cardiac rehabilitation phase I and tolerating that well.  She was started on Cipro empirically to treat a urinary tract infection and this will be completed as an outpatient.  Her blood sugars remained in stable control.  She was restarted on her preoperative diabetic medications.  It was noted that her preop CAT scan of the chest revealed sclerotic bone lesions of the thoracic spine.  This  was discussed with her radiation oncologist, Dr. Roselind Messier, and he will see her as an outpatient to assess possible metastatic breast cancer.  The patient continued to improve.  She is anticipated for discharge the morning of Feb 16, 2002.  CONDITION ON DISCHARGE:  Stable.  DISCHARGE MEDICATIONS:  1. Aspirin 325 mg daily.  2. Darvocet one to two tablets every four to  six hours as needed for pain.  3. Lopressor 25 mg p.o. b.i.d.  4. Actos 30 mg daily.  5. Lipitor 20 mg daily.  6. Glucovance 5/500 p.o. b.i.d.  7. Ciprofloxacin 250 mg one tablet twice a day for two more days.  8. Colace 100 mg two tablets a day.  Hold for loose stools.  9. Niferex 150 mg one tablet a day with food. 10. Continue Lamisil and Fosamax. 11. She is to hold on in resuming her Plavix for now. 12. She was on Lotrel preoperatively and this will be determined at the time     of discharge if she will restart this.  ACTIVITY:  The patient is instructed not to do any driving or lifting more than 10 pounds.  She is to walk daily and continue her breathing exercises.  DIET:  Low fat, low sodium, diabetic ADA diet of 1800 calories a day.  WOUND CARE:  She was told she could shower.  Clean wounds with mild soap and water.  Call the office if any wound problems arise as noted on fax sheet.  SPECIAL INSTRUCTIONS:  The patient was given a cardiac surgery fax sheet.  She is to have her staples removed at our office on Thursday, May 22, at 10 a.m. FOLLOWUP:  She is to call Dr. Clarene Duke to see him in two weeks for a follow-up appointment. She is to have a chest x-ray taken at the [facility] on Friday, June 6, at 8:15 a.m. followed by an appointment with Dr. Donata Clay on the same day at 9:15 a.m.  She is told to bring the chest x-ray with her to this appointment.  FOLLOWUP:  Follow up with Dr. Sharol Roussel and Dr. Hetty Ely as directed. Dictated by:   Dominica Severin, P.A. Attending Physician:  Mikey Bussing DD:  02/15/02 TD:  02/18/02 Job: [id number]

## 2011-02-20 NOTE — Cardiovascular Report (Signed)
[place]. [facility]  Patient:    [name], [name] Visit Number: [id number] MRN: [id number]          Service Type: CAT Location: [facility] 2899 13 Attending Physician:  Loreli Dollar Dictated by:   Julieanne Manson, M.D. Proc. Date: 01/25/02 Admit Date:  01/25/2002   CC:         Laurita Quint, M.D. [facility]  Cardiac Catheterization Laboratory   Cardiac Catheterization  INDICATIONS FOR PROCEDURE: The patient is a 76 year old female who had a nuclear study performed several years ago and was told she needed to have a cardiac catheterization. She did not followup on this. She did have a stent placed to her left iliac. She has multiple risk factors and has finally decided to proceed on with cardiac catheterization.  DESCRIPTION OF PROCEDURE: The patient was prepped and draped in the usual sterile fashion exposing the right groin.  Following local anesthetic with 1% Xylocaine, the Seldinger technique was employed and a 5 [place] introducer sheath was placed into the right femoral artery.  Selective left and right coronary arteriography, ventriculography in the RAO projection and a distal aortogram at the level of the renals was performed. She had positive Doppler studies for a questionable right renal artery stenosis.  RESULTS: 1. Hemodynamic monitoring: Central aortic pressure 183/81, left    ventricular pressure 183/22 with no aortic valve gradient noted at the    time of pullback. 2. Ventriculography: Ventriculography in the RAO projection using    25 cc of contrast at 12 cc/sec. was associated with an episode of    nonsustained ventricular tachycardia. She had no wall motion abnormalities.    The left ventricular end-diastolic pressure was 22. The ejection fraction    greater than 70%.  DISTAL AORTOGRAM: The distal aortogram using 30 cc of contrast at 15 cc/sec. performed at the level of the renals showed no abdominal aortic aneurysm. There was a  hypolucent area at the ostium of the right renal artery but there was brisk distal flow and no obvious stenosis in the AP view.  CORONARY ARTERIOGRAPHY: There was dense calcification on fluoroscopy in the distribution of the left main LAD and proximal circumflex. 1. Left main: The left main tapered into a 30% area of narrowing. This    same tapering extending to the ostia of both the LAD and circumflex. 2. LAD: The LAD had a shelflike appearance at the ostium with a 75% area    of narrowing. There was mild irregularities in the proximal half of the    LAD and there was a large diagonal with diffuse mild irregularities in the    proximal half. 3. Circumflex: The circumflex had an ostial narrowing of around 30-40%. There    was an eccentric area of 40-50% narrowing in the circumflex at the takeoff    of OM-1. OM-1 had mild irregularities. OM-2 was small with diffuse    disease. It was about 1 to 1.5 mm in diameter. 4. Right coronary artery: The entire proximal two thirds of the right coronary    artery was diffusely diseased. The only normal segment might have been    the ostium. The PDA and posterolateral branches were free of disease but    very small.  CONCLUSIONS: 1. Normal left ventricular function. 2. Mild haziness at the ostium of the right renal artery but no significant    renal artery stenosis. 3. Three-vessel coronary disease including terminal 30% left main, ostial    narrowing of  the circumflex and high-grade ostial narrowing of the left    anterior descending with diffuse disease throughout the entire right    coronary artery.  The patient will be discharged to home. She needs time to think about the need for surgery. Dictated by:   Julieanne Manson, M.D. Attending Physician:  Loreli Dollar DD:  01/25/02 TD:  01/25/02 Job: [id number]

## 2011-02-20 NOTE — Op Note (Signed)
[place]. [facility]  Patient:    [name]                          MRN: [id number] Proc. Date: 10/14/99 Adm. Date:  [id number] Attending:  Katha Cabal CC:         Meindert A. Lacie Scotts, M.D.             [employer]                           Operative Report  CCS NUMBER:  [id number].  OPERATION:  Right sentinel lymph node biopsy (2) and right re-excision lumpectomy.  SURGEON:  Thornton Park. Daphine Deutscher, M.D.  ANESTHESIA:  General.  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  This biopsy was done on August 13, 1999.  Postoperatively, she saw Dr. Billie Lade in radiation therapy and was felt o possibly be a candidate for radiation therapy and breast conservation.  She is brought to the operating room at this time for a right sentinel lymph node biopsy and re-excision lumpectomy.  DESCRIPTION OF PROCEDURE:  [name] was taken to OR #16 after technetium injection of the breast.  First, I injected about 1 cc beneath the areolar and then about 3 cc in the wound from her previous excision.  First, I went up with the mapping probe and mapped two areas in her right axilla which were hot with counts in the 200 to 300 range.  The breast was prepped with Betadine and draped sterilely.  Between these two areas, I made a small one inch incision and went down and localized the more inferior of these two first and found the blue hot node with  counts that were 400 to 500.  It went all the way up to 800 when I got it out and put it on the probe.  This was sent as the first inferior sentinel node.  Above this, I dissected out the other area that was hot and this again had counts that were comparable.  This was removed as well.  This wound was then closed with 4-0 Vicryl  subcutaneously and eventually with interrupted simple sutures of 4-0 Prolene.  Next, I described a long ellipse and excised her previous skin incision. Eventually, I oriented a suture toward the inferior margin of this and then went and created skin flaps fairly thin and went up toward the nipple, very far, basically excising her right upper outer quadrant.  I carried this medially into the subareolar region and then down to the chest wall and laterally getting down moving it off the chest wall and basically the whole of the right upper outer quadrant.  This was marked with a staple in the posterior margin and another suture toward the nipple.  This was sent to Dr. Arna Medici who examined this and did touch preps which saw a few atypical cells at the margin but nothing definitive for tumor.  By this time, we had already closed and had cleaned and washed the area  out.  The excision was done with the electrocautery so all the margins had essentially been electrocoagulated.  The wound was dry and closed with 4-0 Vicryl subcutaneously and then with interrupted simple sutures of 4-0 Prolene and  she does have a tape allergies. Sterile dressings were applied.  The patient will be kept overnight for observation as necessary.  The patient has been given a previous prescription for Tylox to take for pain.  DISPOSITION:  Pending the permanent sections on her re-excision lumpectomy as well as permanent sections of her lymph nodes. DD:  10/14/99 TD:  10/14/99 Job: [id number]

## 2011-02-20 NOTE — Op Note (Signed)
[place]  Patient:    [name], ENSLIN Visit Number: [id number] MRN: [id number]          Service Type: SUR Location: 2300 [id number] Attending Physician:  Mikey Bussing Dictated by:   Mikey Bussing, M.D. Proc. Date: 02/09/02 Admit Date:  02/09/2002   CC:         CVTS office  Julieanne Manson, M.D.   Operative Report  PREOPERATIVE DIAGNOSIS:  Class 3 progressive angina with severe three vessel coronary artery disease, diabetes, and peripheral vascular disease, left main disease.  POSTOPERATIVE DIAGNOSIS:  Class 3 progressive angina with severe three vessel coronary artery disease, diabetes, and peripheral vascular disease, left main disease.  OPERATION:  Coronary artery bypass grafting x4 (left internal mammary artery to LAD, saphenous vein graft to diagonal, saphenous vein graft to circumflex marginal, saphenous vein graft to posterior descending).  SURGEON:  Mikey Bussing, M.D.  ASSISTANT:  Tollie Pizza. Thomasena Edis, P.A.-C.  ANESTHESIA:  General by Maren Beach, M.D.  INDICATIONS:  The patient is a 76 year old type 2 diabetic who presents with progressive exertional angina and dyspnea on exertion.  Cardiac catheterization by Dr. Clarene Duke demonstrated severe three vessel coronary artery disease with preserved LV function.  She did have subtotal, approximately 60% stenosis of the left main.  She was referred for a surgical coronary revascularization.  Prior to the surgery, I examined the patient in my office and reviewed the results of the cardiac cath with the patient and her husband.  I discussed the indications and expected benefits of coronary bypass surgery for treatment of her coronary artery disease.  I reviewed the alternatives to surgical therapy for treatment of her coronary artery disease and the expected outcome of those alternative therapies.  I reviewed the major aspects of the coronary bypass operation including  the choice of conduits for grafting, the location of the surgical incisions, the use of general anesthesia, and cardiopulmonary bypass, and the expected postoperative hospital recovery.  I reviewed with the patient the risks of coronary artery bypass surgery to her including those risks of MI, CVA, bleeding, infection, blood transfusion requirement, and death.  She understood these implications for the surgery, and agreed to proceed with the operation as we discussed under what I felt was an informed consent.  OPERATIVE FINDINGS:  The saphenous vein was taken from the right thigh.  It was of below average quality.  The distal right coronary and posterior descending were very small vessels, suboptimal targets for grafting.  The distal circumflex was small and was not grafted.  The myocardium was well-preserved without areas of scarring or fibrosis.  DESCRIPTION OF PROCEDURE:  The patient was brought to the operating room and placed supine on the operating room table where general anesthesia was induced under invasive hemodynamic monitoring.  The chest, abdomen, and legs were prepped with Betadine, and draped as a sterile field.  A sternal incision was made as the saphenous vein was harvested from the right thigh.  The internal mammary artery pedicle was dissected from the left chest up its origin at the subclavian vessels.  It was a good vessel with excellent flow.  Heparin was administered and the ACT was documented as being therapeutic.  The sternal retractor was placed.  Through pursestrings placed in the ascending aorta and right atrium.  The patient was cannulated and placed on bypass and cooled to 32 degrees.  The coronaries were identified for grafting.  The mammary artery  and vein grafts were prepared for the distal anastomoses.  A cardioplegia cannula was placed in the ascending aorta.  The patient was cooled to 28 degrees.  As the aortic cross-clamp was applied, 500 cc of cold  blood cardioplegia was delivered to the aortic root with immediate cardioplegic arrest and the septal temperature dropping less than 14 degrees centigrade. Topical ice saline slush was used to augment myocardial preservation and a pericardial insufflator pad was used to protect the left phrenic nerve.  The distal coronary anastomoses were then performed.  The first distal anastomosis was to the posterior descending.  This is a 1.2 mm vessel with proximal 80% stenosis.  A reverse saphenous vein was sewn end-to-side with a running 7-0 Prolene with good flow through the graft.  The second distal anastomosis was to the first diagonal.  This is a 1.5 mm vessel with proximal 90% stenosis.  A reverse saphenous vein was sewn end-to-side with a running 7-0 Prolene and with good flow through the graft.  The third distal anastomosis was to the circumflex marginal.  This is a larger 1.8 mm vessel with proximal 80% stenosis.  A reverse saphenous vein was sewn end-to-side with a running 7-0 Prolene with good flow through the graft. Cardioplegia was redosed.  A fourth distal anastomosis was to the distal third of the LAD.  This was a 1.8 mm to 2.0 mm vessel with proximal 80% stenosis.  The left internal mammary artery pedicle was brought through an opening created in the left lateral pericardium.  It was brought down on the LAD and sewn end-to-side with a running 8-0 Prolene.  There was excellent flow through the anastomosis with immediate rise in septal temperature after release of the pedicle clamp on the mammary artery.  The mammary and pedicle was secured to the epicardium and the aortic cross-clamp was removed.  The heart was cardioverted back to a regular rhythm.  The vein grafts were then divided to the appropriate lengths and a partial occluding clamp was placed on the ascending aorta.  Three proximal vein anastomoses were placed on  the ascending aorta using a 4.0 mm punch and a running  6-0 Prolene.  The partial clamp was removed and the vein grafts were perfused.  Each had excellent flow and hemostasis was documented at the proximal and distal sites. The patient was rewarmed and temporary pacing wires were applied.  The lungs reexpanded and the ventilator was resumed.  The patient was then weaned from bypass after her temperature reached 37 degrees.  Cardiac output and blood pressure were stable.  Protamine was administered.  There was no adverse reaction to the Protamine.  The mediastinum was irrigated with antibiotic irrigation after removal of the perfusion cannulae.  The leg incision was irrigated and closed in a standard fashion.  The pericardium was loosely reapproximated.  Two mediastinal and a left pleural chest tube were placed and brought out through separate incisions.  The sternum was reapproximated with interrupted steel wire.  The pectoralis fascia was closed with interrupted #1 Vicryl.  The subcutaneous and skin were closed with a running Vicryl and sterile dressings were applied.  Total bypass time was 140 minutes with aortic cross-clamp time of 70 minutes. Dictated by:   Mikey Bussing, M.D. Attending Physician:  Mikey Bussing DD:  02/09/02 TD:  02/13/02 Job: [id number]

## 2011-02-20 NOTE — Cardiovascular Report (Signed)
NAMEJIMENA, [name]                  ACCOUNT NO.:  [id number]   MEDICAL RECORD NO.:  [id number]          PATIENT TYPE:  OIB   LOCATION:  2899                         FACILITY:  [facility]   PHYSICIAN:  Thereasa Solo. Little, M.D. DATE OF BIRTH:  Jun 22, 1935   DATE OF PROCEDURE:  01/13/2007  DATE OF DISCHARGE:                            CARDIAC CATHETERIZATION   INDICATIONS:  This 76 year old female diabetic had bypass surgery in  2003.  She also has a stent in her left iliac.  She is not having any  anginal complaints but had a routine stress test done five years out  from her surgery that was strongly positive.  It showed anterior  ischemia and normal LV function.  Because of this, she is brought in for  outpatient cardiac catheterization.   After obtaining informed consent, the patient was prepped and draped in  the usual sterile fashion exposing the right groin.  Following local  anesthetic with 1% Xylocaine, the Seldinger technique was employed and a  5-French introducer sheath was placed into the right femoral artery.  Left and right coronary arteriography, graft visualization x4, the  distal aortogram and ventriculography in the RAO projection was  performed.   COMPLICATIONS:  None.   EQUIPMENT:  5-French Judkins configuration catheters.  A RCA Judkins  right catheter was used for all grafts including the internal mammary  artery.   TOTAL CONTRAST:  120 mL.   RESULTS:  HEMODYNAMIC MONITORING:  Central aortic pressure was 120/49.  Left ventricular pressure was 122/8 and there was no significant valve  gradient noted at the time of pullback.   VENTRICULOGRAPHY:  Ventriculography in the RAO projection using 20 mL of  contrast at 12 mL per second showed normal LV systolic function.  Ejection fraction greater than 55%.  The left ventricular end-diastolic  pressure was 17, +1 mitral regurgitation was seen, no focal wall motion  abnormalities.   DISTAL AORTOGRAM:  The distal aortogram  done slightly above the level of  the renal arteries using 30 mL of contrast at 50 mL per second revealed  calcification of the distal aorta.  There was no abdominal aortic  aneurysm.  The left iliac stent was widely patent.  The right iliac  vessel had mild disease.  The right renal artery appeared to be normal.  The left was poorly visualized because of overlapping vessels.   CORONARY ARTERIOGRAPHY:  On fluoroscopy there was dense calcification in the distribution of the  left main, proximal LAD and proximal circumflex.   1. Left main:  The left main tapered and was 50% narrowed at the      terminal portion of the left main.  It bifurcated.  2. Circumflex:  100% occluded.  3. LAD:  The LAD had an area of 80% narrowing in its mid portion.      There was brisk bidirectional flow in the distal portion of the LAD      and there was retrograde filling of the diagonal from the distal      LAD noted through the native circulation.  I did not appreciate  the      diagonal filling antegrade.  4. Right coronary artery:  100% occluded in its mid portion.   GRAFTS:  1. Saphenous vein graft to the OM:  The graft itself was widely      patent.  There was a large valve in the mid portion of the vessel      that appeared to be normal.  The OM itself was widely patent and      there was late filling late visualization of a second OM and there      were left to left collaterals seen.  2. Saphenous vein graft to the PDA:  The graft was small.  There was a      valve in the distal portion of the graft.  The graft was overall      normal.  3. The PDA was small and there was retrograde filling of two posterior      lateral vessels and there were some right-to-left collateral      visualization also seen.  4. Saphenous vein graft to the diagonal:  The graft was 100% occluded      in its mid portion.  There was hang-up of the contrast in the mid      portion of the graft.  The diagonal was never  visualized via this      graft.  5. Internal mammary artery to the LAD:  The internal mammary artery      was widely patent.  It inserted into the distal portion of the LAD      and distal to the insertion site was an area of 30% narrowing in      the LAD itself.  The distal LAD was free of disease and was      retrograde filling of the diagonal from the distal LAD.   CONCLUSION:  1. Loss of the saphenous vein graft to the diagonal with excellent      retrograde filling of the diagonal from both the native and the      internal mammary artery supplied to the left anterior descending.  2. Widely patent graft to the obtuse marginal and to the posterior      descending artery and widely patent internal mammary artery to the      left anterior descending.  3. Normal left ventricular systolic function.  4. Patent stent in the left iliac.  5. Mild right iliac disease.   In view of the retrograde filling of the diagonals, the decision was  made to treat her medically.  She will be discharged home today and I  will increase her cardiac meds and probably add Ranexa to her current  medications.           ______________________________  Thereasa Solo Little, M.D.     ABL/MEDQ  D:  01/13/2007  T:  01/13/2007  Job:  [id number]   cc:   Arta Silence, MD  Cath Lab

## 2011-03-04 ENCOUNTER — Emergency Department (HOSPITAL_COMMUNITY)
Admission: EM | Admit: 2011-03-04 | Discharge: 2011-03-04 | Disposition: A | Discharge status: Home / Self Care | Payer: Medicare Other | Attending: Emergency Medicine | Admitting: Emergency Medicine

## 2011-03-04 ENCOUNTER — Emergency Department (HOSPITAL_COMMUNITY): Payer: Medicare Other

## 2011-03-04 DIAGNOSIS — I2510 Atherosclerotic heart disease of native coronary artery without angina pectoris: Secondary | ICD-10-CM | POA: Insufficient documentation

## 2011-03-04 DIAGNOSIS — E7800 Pure hypercholesterolemia, unspecified: Secondary | ICD-10-CM | POA: Insufficient documentation

## 2011-03-04 DIAGNOSIS — K922 Gastrointestinal hemorrhage, unspecified: Secondary | ICD-10-CM | POA: Insufficient documentation

## 2011-03-04 DIAGNOSIS — E119 Type 2 diabetes mellitus without complications: Secondary | ICD-10-CM | POA: Insufficient documentation

## 2011-03-04 DIAGNOSIS — Z794 Long term (current) use of insulin: Secondary | ICD-10-CM | POA: Insufficient documentation

## 2011-03-04 DIAGNOSIS — Z951 Presence of aortocoronary bypass graft: Secondary | ICD-10-CM | POA: Insufficient documentation

## 2011-03-04 DIAGNOSIS — I10 Essential (primary) hypertension: Secondary | ICD-10-CM | POA: Insufficient documentation

## 2011-03-04 DIAGNOSIS — Z933 Colostomy status: Secondary | ICD-10-CM | POA: Insufficient documentation

## 2011-03-04 LAB — TYPE AND SCREEN
ABO/RH(D): A POS
Antibody Screen: NEGATIVE

## 2011-03-04 LAB — DIFFERENTIAL
Basophils Absolute: 0 10*3/uL (ref 0.0–0.1)
Basophils Relative: 0 % (ref 0–1)
Eosinophils Absolute: 0.2 10*3/uL (ref 0.0–0.7)
Eosinophils Relative: 3 % (ref 0–5)
Lymphocytes Relative: 26 % (ref 12–46)
Lymphs Abs: 2.1 10*3/uL (ref 0.7–4.0)
Monocytes Absolute: 1 10*3/uL (ref 0.1–1.0)
Monocytes Relative: 12 % (ref 3–12)
Neutro Abs: 4.9 10*3/uL (ref 1.7–7.7)
Neutrophils Relative %: 59 % (ref 43–77)

## 2011-03-04 LAB — BASIC METABOLIC PANEL
BUN: 39 mg/dL — ABNORMAL HIGH (ref 6–23)
CO2: 24 mEq/L (ref 19–32)
Calcium: 9.6 mg/dL (ref 8.4–10.5)
Chloride: 103 mEq/L (ref 96–112)
Creatinine, Ser: 1.15 mg/dL (ref 0.4–1.2)
GFR calc Af Amer: 56 mL/min — ABNORMAL LOW (ref >60)
GFR calc non Af Amer: 46 mL/min — ABNORMAL LOW (ref >60)
Glucose, Bld: 104 mg/dL — ABNORMAL HIGH (ref 70–99)
Potassium: 4.6 mEq/L (ref 3.5–5.1)
Sodium: 137 mEq/L (ref 135–145)

## 2011-03-04 LAB — CBC
MCV: 91.5 fL (ref 78.0–100.0)
Platelets: 296 10*3/uL (ref 150–400)
RBC: 3.64 MIL/uL — ABNORMAL LOW (ref 3.87–5.11)
RDW: 15.4 % (ref 11.5–15.5)
WBC: 8.3 10*3/uL (ref 4.0–10.5)

## 2011-03-04 LAB — ABO/RH: ABO/RH(D): A POS

## 2011-03-05 ENCOUNTER — Other Ambulatory Visit (INDEPENDENT_AMBULATORY_CARE_PROVIDER_SITE_OTHER): Payer: Medicare Other | Admitting: Family Medicine

## 2011-03-05 ENCOUNTER — Encounter: Payer: Self-pay | Admitting: Family Medicine

## 2011-03-05 ENCOUNTER — Ambulatory Visit (INDEPENDENT_AMBULATORY_CARE_PROVIDER_SITE_OTHER): Payer: Medicare Other | Admitting: Family Medicine

## 2011-03-05 ENCOUNTER — Other Ambulatory Visit: Payer: Self-pay | Admitting: Family Medicine

## 2011-03-05 VITALS — BP 100/58 | HR 60 | Temp 98.0°F | Wt 148.5 lb

## 2011-03-05 DIAGNOSIS — D509 Iron deficiency anemia, unspecified: Secondary | ICD-10-CM

## 2011-03-05 DIAGNOSIS — D649 Anemia, unspecified: Secondary | ICD-10-CM

## 2011-03-05 DIAGNOSIS — K922 Gastrointestinal hemorrhage, unspecified: Secondary | ICD-10-CM

## 2011-03-05 LAB — BASIC METABOLIC PANEL
BUN: 36 mg/dL — ABNORMAL HIGH (ref 6–23)
CO2: 25 mEq/L (ref 19–32)
Calcium: 9.4 mg/dL (ref 8.4–10.5)
Chloride: 106 mEq/L (ref 96–112)
Creatinine, Ser: 1.3 mg/dL — ABNORMAL HIGH (ref 0.4–1.2)
GFR: 41.86 mL/min — ABNORMAL LOW (ref >60.00)
Glucose, Bld: 248 mg/dL — ABNORMAL HIGH (ref 70–99)
Potassium: 4.8 mEq/L (ref 3.5–5.1)
Sodium: 139 mEq/L (ref 135–145)

## 2011-03-05 LAB — CBC WITH DIFFERENTIAL/PLATELET
Basophils Absolute: 0 10*3/uL (ref 0.0–0.1)
Basophils Relative: 0.3 % (ref 0.0–3.0)
Eosinophils Absolute: 0.2 10*3/uL (ref 0.0–0.7)
Eosinophils Relative: 2.9 % (ref 0.0–5.0)
HCT: 32 % — ABNORMAL LOW (ref 36.0–46.0)
Hemoglobin: 10.6 g/dL — ABNORMAL LOW (ref 12.0–15.0)
Lymphocytes Relative: 23.3 % (ref 12.0–46.0)
Lymphs Abs: 1.7 10*3/uL (ref 0.7–4.0)
MCHC: 33.1 g/dL (ref 30.0–36.0)
MCV: 92.2 fl (ref 78.0–100.0)
Monocytes Absolute: 0.7 10*3/uL (ref 0.1–1.0)
Monocytes Relative: 10.1 % (ref 3.0–12.0)
Neutro Abs: 4.7 10*3/uL (ref 1.4–7.7)
Neutrophils Relative %: 63.4 % (ref 43.0–77.0)
Platelets: 279 10*3/uL (ref 150.0–400.0)
RBC: 3.47 Mil/uL — ABNORMAL LOW (ref 3.87–5.11)
WBC: 7.3 10*3/uL (ref 4.5–10.5)

## 2011-03-05 NOTE — Patient Instructions (Signed)
Pls sched bloodtest Tues PM.

## 2011-03-05 NOTE — Progress Notes (Signed)
  Subjective:    Patient ID: [name], female    DOB: Apr 21, 1935, 76 y.o.   MRN: [id number]  HPI Pt here for being sent here for H/H after being seen last night for "GI Bleed" per paperwork she had with her when she walked in for her unscheduled lab. I was uncomfortable with ordering a lab for a GI bleed w/o seeing the pt.  Her story: Tues she started noticing when cleaning her colostomy bag that she had dark blood and thought was from prunes but noticed that it was a blood clot. Then last night about 630PM was checking again and had dark purplish-blue blood and another bloodclot. She cleaned this out and noticed continued dark blood oozing into the colostomy bag and she called Dr Carollee Massed office, whoi had done her bowel resection. Dr Laurence Aly was on call and told her to go to the  ER to be seen. At the ER they checked her HGB, did 2 Xrays of the abd and did type and cross but her CBC was stable. Then she was sent here for followup CBC. They wonder if she has an ulcer or other source. She had collected about a cup of blood in the total time. Her Hgb was 10.7 then. She has no tenderness, some gas in the colostomy, no fatigue or lightheadedness, no fever. She has no pain in the epigstric area. She has had some shooting, stabbbing pains in the lower abd.     Review of SystemsNoncontributory except as above.       Objective:   Physical Exam  Constitutional: She appears well-developed and well-nourished. No distress.  HENT:  Head: Normocephalic and atraumatic.  Right Ear: External ear normal.  Left Ear: External ear normal.  Nose: Nose normal.  Mouth/Throat: Oropharynx is clear and moist. No oropharyngeal exudate.  Eyes: Conjunctivae and EOM are normal. Pupils are equal, round, and reactive to light.  Neck: Normal range of motion. Neck supple. No thyromegaly present.  Cardiovascular: Normal rate, regular rhythm and normal heart sounds.   Pulmonary/Chest: Effort normal and breath sounds  normal. She has no wheezes. She has no rales.  Abdominal: Soft. Bowel sounds are normal. She exhibits no distension and no mass. There is no tenderness. There is no rebound and no guarding.       Minimal amt of dark stool in th colostomy bag when seen.  Lymphadenopathy:    She has no cervical adenopathy.  Skin: She is not diaphoretic.          Assessment & Plan:

## 2011-03-05 NOTE — Assessment & Plan Note (Signed)
Pt appears stable. Unknown if material in colosomy bag is guaiac pos or not. Did not test today. Exam benign. CBC stable and BMET  Suspicious for poss bleed. Will repeat CBC and Bmet Tues PM and respond as needed. Lab Results  Component Value Date   WBC 7.3 03/05/2011   HGB 10.6* 03/05/2011   HCT 32.0* 03/05/2011   MCV 92.2 03/05/2011   PLT 279.0 03/05/2011  H/H last night 10.7/33.3   Previously 10.9/32.9 Bmet signif for Glu 248 (had just eaten lunch when bloodwork done and had shared a pastry with her husb.) BUN/Cr 36/1.3 (both slightly up from baseline 11/1.09.

## 2011-03-08 ENCOUNTER — Emergency Department (HOSPITAL_COMMUNITY)
Admission: EM | Admit: 2011-03-08 | Discharge: 2011-03-08 | Discharge status: Against Medical Advice | Payer: Medicare Other | Attending: Emergency Medicine | Admitting: Emergency Medicine

## 2011-03-08 DIAGNOSIS — I2510 Atherosclerotic heart disease of native coronary artery without angina pectoris: Secondary | ICD-10-CM | POA: Insufficient documentation

## 2011-03-08 DIAGNOSIS — E119 Type 2 diabetes mellitus without complications: Secondary | ICD-10-CM | POA: Insufficient documentation

## 2011-03-08 DIAGNOSIS — K922 Gastrointestinal hemorrhage, unspecified: Secondary | ICD-10-CM | POA: Insufficient documentation

## 2011-03-08 DIAGNOSIS — Z853 Personal history of malignant neoplasm of breast: Secondary | ICD-10-CM | POA: Insufficient documentation

## 2011-03-08 DIAGNOSIS — I10 Essential (primary) hypertension: Secondary | ICD-10-CM | POA: Insufficient documentation

## 2011-03-08 DIAGNOSIS — Z79899 Other long term (current) drug therapy: Secondary | ICD-10-CM | POA: Insufficient documentation

## 2011-03-08 DIAGNOSIS — E7800 Pure hypercholesterolemia, unspecified: Secondary | ICD-10-CM | POA: Insufficient documentation

## 2011-03-08 LAB — CBC
HCT: 33.8 % — ABNORMAL LOW (ref 36.0–46.0)
Hemoglobin: 10.6 g/dL — ABNORMAL LOW (ref 12.0–15.0)
MCHC: 31.4 g/dL (ref 30.0–36.0)
MCV: 92.9 fL (ref 78.0–100.0)
WBC: 8.8 10*3/uL (ref 4.0–10.5)

## 2011-03-08 LAB — COMPREHENSIVE METABOLIC PANEL
ALT: 9 U/L (ref 0–35)
Albumin: 3.7 g/dL (ref 3.5–5.2)
Alkaline Phosphatase: 53 U/L (ref 39–117)
Chloride: 106 mEq/L (ref 96–112)
Potassium: 5 mEq/L (ref 3.5–5.1)
Sodium: 137 mEq/L (ref 135–145)
Total Bilirubin: 0.2 mg/dL — ABNORMAL LOW (ref 0.3–1.2)
Total Protein: 6.9 g/dL (ref 6.0–8.3)

## 2011-03-08 LAB — DIFFERENTIAL
Basophils Absolute: 0 10*3/uL (ref 0.0–0.1)
Eosinophils Relative: 3 % (ref 0–5)
Lymphocytes Relative: 20 % (ref 12–46)
Lymphs Abs: 1.8 10*3/uL (ref 0.7–4.0)
Monocytes Absolute: 0.8 10*3/uL (ref 0.1–1.0)
Neutro Abs: 6 10*3/uL (ref 1.7–7.7)

## 2011-03-08 LAB — TYPE AND SCREEN: Antibody Screen: NEGATIVE

## 2011-03-10 ENCOUNTER — Other Ambulatory Visit (INDEPENDENT_AMBULATORY_CARE_PROVIDER_SITE_OTHER): Payer: Medicare Other | Admitting: Family Medicine

## 2011-03-10 DIAGNOSIS — E119 Type 2 diabetes mellitus without complications: Secondary | ICD-10-CM

## 2011-03-24 ENCOUNTER — Emergency Department (HOSPITAL_COMMUNITY): Payer: Medicare Other

## 2011-03-24 ENCOUNTER — Emergency Department (HOSPITAL_COMMUNITY)
Admission: EM | Admit: 2011-03-24 | Discharge: 2011-03-24 | Disposition: A | Discharge status: Home / Self Care | Payer: Medicare Other | Attending: Emergency Medicine | Admitting: Emergency Medicine

## 2011-03-24 DIAGNOSIS — R61 Generalized hyperhidrosis: Secondary | ICD-10-CM | POA: Insufficient documentation

## 2011-03-24 DIAGNOSIS — D649 Anemia, unspecified: Secondary | ICD-10-CM | POA: Insufficient documentation

## 2011-03-24 DIAGNOSIS — R072 Precordial pain: Secondary | ICD-10-CM | POA: Insufficient documentation

## 2011-03-24 DIAGNOSIS — I10 Essential (primary) hypertension: Secondary | ICD-10-CM | POA: Insufficient documentation

## 2011-03-24 DIAGNOSIS — I2510 Atherosclerotic heart disease of native coronary artery without angina pectoris: Secondary | ICD-10-CM | POA: Insufficient documentation

## 2011-03-24 DIAGNOSIS — E119 Type 2 diabetes mellitus without complications: Secondary | ICD-10-CM | POA: Insufficient documentation

## 2011-03-24 DIAGNOSIS — N289 Disorder of kidney and ureter, unspecified: Secondary | ICD-10-CM | POA: Insufficient documentation

## 2011-03-24 LAB — COMPREHENSIVE METABOLIC PANEL
ALT: 9 U/L (ref 0–35)
AST: 13 U/L (ref 0–37)
Albumin: 3.5 g/dL (ref 3.5–5.2)
Alkaline Phosphatase: 52 U/L (ref 39–117)
BUN: 48 mg/dL — ABNORMAL HIGH (ref 6–23)
Calcium: 9.1 mg/dL (ref 8.4–10.5)
Creatinine, Ser: 1.46 mg/dL — ABNORMAL HIGH (ref 0.50–1.10)
GFR calc Af Amer: 42 mL/min — ABNORMAL LOW (ref >60)
GFR calc non Af Amer: 35 mL/min — ABNORMAL LOW (ref >60)
Total Protein: 6.4 g/dL (ref 6.0–8.3)

## 2011-03-24 LAB — CBC
HCT: 31 % — ABNORMAL LOW (ref 36.0–46.0)
MCH: 29.5 pg (ref 26.0–34.0)
MCHC: 32.3 g/dL (ref 30.0–36.0)
MCV: 91.4 fL (ref 78.0–100.0)
RDW: 15.8 % — ABNORMAL HIGH (ref 11.5–15.5)

## 2011-03-25 ENCOUNTER — Other Ambulatory Visit (INDEPENDENT_AMBULATORY_CARE_PROVIDER_SITE_OTHER): Payer: Self-pay | Admitting: General Surgery

## 2011-03-25 DIAGNOSIS — Z433 Encounter for attention to colostomy: Secondary | ICD-10-CM

## 2011-04-17 ENCOUNTER — Ambulatory Visit (HOSPITAL_COMMUNITY)
Admission: RE | Admit: 2011-04-17 | Discharge: 2011-04-19 | Disposition: A | Discharge status: Home / Self Care | Payer: Medicare Other | Source: Ambulatory Visit | Attending: Cardiology | Admitting: Cardiology

## 2011-04-17 DIAGNOSIS — I25810 Atherosclerosis of coronary artery bypass graft(s) without angina pectoris: Secondary | ICD-10-CM | POA: Insufficient documentation

## 2011-04-17 DIAGNOSIS — R079 Chest pain, unspecified: Secondary | ICD-10-CM | POA: Insufficient documentation

## 2011-04-17 DIAGNOSIS — I2510 Atherosclerotic heart disease of native coronary artery without angina pectoris: Secondary | ICD-10-CM

## 2011-04-17 DIAGNOSIS — Z01810 Encounter for preprocedural cardiovascular examination: Secondary | ICD-10-CM | POA: Insufficient documentation

## 2011-04-17 DIAGNOSIS — Z79899 Other long term (current) drug therapy: Secondary | ICD-10-CM | POA: Insufficient documentation

## 2011-04-17 DIAGNOSIS — Z01812 Encounter for preprocedural laboratory examination: Secondary | ICD-10-CM | POA: Insufficient documentation

## 2011-04-17 HISTORY — PX: CARDIAC CATHETERIZATION: SHX172

## 2011-04-17 LAB — BASIC METABOLIC PANEL
BUN: 37 mg/dL — ABNORMAL HIGH (ref 6–23)
Chloride: 102 mEq/L (ref 96–112)
GFR calc Af Amer: >60 mL/min (ref >60)
GFR calc non Af Amer: 50 mL/min — ABNORMAL LOW (ref >60)
Potassium: 4.6 mEq/L (ref 3.5–5.1)
Sodium: 137 mEq/L (ref 135–145)

## 2011-04-17 LAB — GLUCOSE, CAPILLARY: Glucose-Capillary: 203 mg/dL — ABNORMAL HIGH (ref 70–99)

## 2011-04-17 LAB — CBC
Hemoglobin: 10.6 g/dL — ABNORMAL LOW (ref 12.0–15.0)
MCH: 30.5 pg (ref 26.0–34.0)
MCV: 92.2 fL (ref 78.0–100.0)
Platelets: 321 10*3/uL (ref 150–400)
RBC: 3.48 MIL/uL — ABNORMAL LOW (ref 3.87–5.11)
WBC: 7.8 10*3/uL (ref 4.0–10.5)

## 2011-04-17 LAB — PROTIME-INR: Prothrombin Time: 12.6 seconds (ref 11.6–15.2)

## 2011-04-18 LAB — BASIC METABOLIC PANEL
Chloride: 104 mEq/L (ref 96–112)
GFR calc Af Amer: 57 mL/min — ABNORMAL LOW (ref >60)
Potassium: 4.2 mEq/L (ref 3.5–5.1)

## 2011-04-18 LAB — CBC
HCT: 29.7 % — ABNORMAL LOW (ref 36.0–46.0)
Platelets: 300 10*3/uL (ref 150–400)
RBC: 3.24 MIL/uL — ABNORMAL LOW (ref 3.87–5.11)
RDW: 15.9 % — ABNORMAL HIGH (ref 11.5–15.5)
WBC: 7.2 10*3/uL (ref 4.0–10.5)

## 2011-04-18 LAB — GLUCOSE, CAPILLARY
Glucose-Capillary: 132 mg/dL — ABNORMAL HIGH (ref 70–99)
Glucose-Capillary: 156 mg/dL — ABNORMAL HIGH (ref 70–99)
Glucose-Capillary: 226 mg/dL — ABNORMAL HIGH (ref 70–99)
Glucose-Capillary: 99 mg/dL (ref 70–99)

## 2011-04-18 LAB — T4, FREE: Free T4: 1.05 ng/dL (ref 0.80–1.80)

## 2011-04-19 LAB — BASIC METABOLIC PANEL
Calcium: 9.4 mg/dL (ref 8.4–10.5)
GFR calc Af Amer: 49 mL/min — ABNORMAL LOW (ref >60)
GFR calc non Af Amer: 40 mL/min — ABNORMAL LOW (ref >60)
Glucose, Bld: 127 mg/dL — ABNORMAL HIGH (ref 70–99)
Sodium: 137 mEq/L (ref 135–145)

## 2011-04-19 LAB — CBC
MCH: 29.4 pg (ref 26.0–34.0)
MCHC: 32 g/dL (ref 30.0–36.0)
Platelets: 291 10*3/uL (ref 150–400)
RDW: 15.9 % — ABNORMAL HIGH (ref 11.5–15.5)

## 2011-04-19 LAB — GLUCOSE, CAPILLARY: Glucose-Capillary: 126 mg/dL — ABNORMAL HIGH (ref 70–99)

## 2011-04-20 NOTE — Consult Note (Signed)
[name] NO.:  [id number]  MEDICAL RECORD NO.:  [id number]  LOCATION:  3742                         FACILITY:  [facility]  PHYSICIAN:  [name], M.D.  DATE OF BIRTH:  1935/02/17  DATE OF CONSULTATION: DATE OF DISCHARGE:                                CONSULTATION   REFERRING PHYSICIAN:  Thereasa Solo. Little, MD  PRIMARY CARE PHYSICIAN:  April Silence, MD  REASON FOR CONSULTATION:  Severe recurrent coronary artery disease, status post CABG x4 in 2003.  HISTORY OF PRESENT ILLNESS:  I was asked to evaluate this 76 year old diabetic female for possible redo bypass grafting after being catheterized today to evaluate her coronary artery disease following 2 episodes of nocturnal angina.  The patient has been caring for her ill husband at home who is now recently deceased.  Twice after he fell at night and she helped him back to bed or out of the bathroom, she developed nocturnal angina.  She presented to the ER after one of these episodes and was checked and her enzymes are negative.  She followed up with Dr. Clarene Farrell and a cardiac cath scheduled for today.  Cath today basically shows a widely patent left IMA graft, totally occluded LAD with flow around the apex and some collateralization of the left coronary vessels and the distal right coronary vessels.  Her EF is normal and EDP is 14 mmHg.  Vein grafts are occluded.  Her native coronary vessels are very small and atretic and poor targets.  The patient tolerated the cath today and is comfortable in her room on Step- Down.  PAST MEDICAL HISTORY: 1. Diabetes mellitus. 2. Hypertension. 3. History of PAF. 4. Dyslipidemia.  MEDICATIONS: 1. Metformin. 2. Glyburide. 3. Actos. 4. Plavix. 5. Dyazide. 6. Sotalol. 7. Benazepril. 8. Diltiazem. 9. Omega-3. 10.Lipitor. 11.Zetia. 12.Recently started on Ranexa 500 b.i.d.  REVIEW OF SYSTEMS:  Earlier this year, she had an episode  of diverticulitis with perforation and required emergency colectomy and colostomy.  Apparently at that time, she had a non-ST elevation MI with a bump in enzymes.  She has had no history of stroke, pneumonia, pulmonary edema or other symptoms of CHF.  She has been dealing with recent death of her husband and lives alone with independent self care.  PHYSICAL EXAMINATION:  VITAL SIGNS:  She is 5 feet 6 inches, weighs 145 pounds.  Blood pressure 134/80, pulse is 60, sinus. GENERAL:  A very pleasant elderly Caucasian female in no acute distress. HEENT:  Normocephalic. NECK:  Without bruit or JVD. LYMPHATICS:  Show no palpable cervical or supraclavicular adenopathy. Breath sounds are clear. CARDIAC:  Reveals regular rhythm without.  Loud S3 gallop.  No murmur. There is a well-healed sternal incision. ABDOMEN:  With a colostomy bag, but nontender. EXTREMITIES:  Reveal tenectomy scar from saphenous vein harvest on the right leg.  Left leg has some mild edema and superficial spider veins. Peripheral pulses are intact. NEUROLOGIC:  Intact.  LABORATORY DATA:  Reviewed coronary arteriograms.  Discussed with Dr. Clarene Farrell.  She has poor targets for grafting in the circumflex and right coronary distribution which is the problem.  Review of her  operative note from 9 years ago indicates that the distal right and distal circumflex were poor vessels for grafting, and I would not expect her to gain significant benefit from redo bypass grafting and would not recommended in this situation.  This was discussed with the patient and family.  She understands and agrees to continue with medical therapy.     [name], M.D.     PV/MEDQ  D:  04/17/2011  T:  04/18/2011  Job:  [id number]  Electronically Signed by [name] M.D. on 04/20/2011 05:42:58 PM

## 2011-04-22 LAB — HM MAMMOGRAPHY: HM Mammogram: NORMAL

## 2011-04-23 NOTE — Discharge Summary (Signed)
[name], [name] NO.:  [id number]  MEDICAL RECORD NO.:  [id number]  LOCATION:  3742                         FACILITY:  [facility]  PHYSICIAN:  Landry Corporal, MD DATE OF BIRTH:  11/14/1934  DATE OF ADMISSION:  04/17/2011 DATE OF DISCHARGE:  04/19/2011                              DISCHARGE SUMMARY   DISCHARGE DIAGNOSES: 1. Atypical chest pain status post left heart cath showing normal left     ventricular systolic function, severe native disease.  She has had     loss of all vein grafts only internal mammary artery being patent.     Her collaterals to the right coronary artery/obtuse marginal and     diagonal system. 2. Paroxysmal atrial fibrillation. 3. Hyperlipidemia. 4. Diabetes mellitus. 5. 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 6. Subacute hypothyroidism.  HOSPITAL COURSE:  [name] is 76 year old Caucasian female with history of diabetes mellitus, hypertension, hyperlipidemia, coronary disease status post bypass graft surgery in 2005 with Dr. Donata Clay.  She has had problems of recurrent angina, and had a non-ST-elevation myocardial infarction in May of 2012, in the setting of colon, colostomy surgery. Catheterization at that time revealed high-grade stenosis in the proximal portion of the vein graft to the obtuse marginal, this was stented.  Previous stent in the ostium of the vein graft in 2008, to the midportion of the vein graft in 2011.  The vein graft to RCA and the vein graft to diagonal had been occluded.  She had patent internal mammary artery.  The patient has had associated increase in worsening chest pain since her husband died approximately 10 days ago and was worsened with walking and exertion and relieved with nitroglycerin.  She was brought in for left heart catheterization which revealed normal LV function,  severe native disease, and loss of vein grafts on an internal mammary artery being patent.  There were  collaterals to the RCA OM and diagonal system.  Dr. Donata Clay was asked to evaluate for possible redo surgery.  Due to poor targets for grafting in the circumflex and right coronary distribution, it is now recommended to do bypass grafting. Continued medical therapy is planned.  The patient did have noted chest pain on morning of April 18, 2011.  Her Imdur was increased 60 mg daily. She was started on 75 mg of Plavix, free T4 was checked, and was normal at 1.05.  She did have elevated TSH at 7.442, currently respectively without further complaints.  She has been seen by Dr. Herbie Baltimore, because she was stable for discharge home.  She will be continued on Ranexa b.i.d. 500 mg and on her increased Imdur 60 mg daily.  She will follow up with Dr. Clarene Duke in the near future.  DISCHARGE LABORATORY DATA:  WBCs 3.9, hemoglobin 9.6, hematocrit 30.0, platelets 291.  Sodium 137, potassium 4.4, chloride 103, carbon dioxide 26, glucose 127, BUN 36, creatinine 1.30, calcium 9.4, free T4 is 1.05, TSH 7.442.  STUDIES/PROCEDURES:  Cardiac catheterization on April 19, 2011, conclusions normal LV systolic function.  Severe native disease.  Loss of all vein grafts with only the internal mammary artery being patent. All collaterals to the RCA,  OM, and diagonal systems.  DISCHARGE MEDICATIONS: 1. Imdur 60 mg one tab by mouth daily. 2. Ranolazine 500 mg 1 tablet by mouth twice daily. 3. Actos 45 mg 1 tablet by mouth daily. 4. Aspirin 81 mg 1 tab by mouth daily. 5. Atorvastatin 20 mg 1 tab by mouth daily at bedtime. 6. Benazepril 10 mg 1 tab by mouth daily. 7. Diltiazem 90 mg 1 tab by mouth daily. 8. Fish oil 1000 mg 1 capsule by mouth daily. 9. Glyburide/metformin 5/500 two tablets by mouth every morning. 10.Humalog 3-10 units subcutaneously 3 times a day as needed sliding     scale. 11.Meclizine 25 mg 1 tablet by mouth daily. 12.Metformin 500 mg 1 tablet by mouth daily at bedtime. 13.Plavix 75 mg 1 tablet by  mouth daily. 14.Atenolol 80 mg 1/2 tablet by mouth twice daily. 15.Triamterene/hydrochlorothiazide 37.5/25 mg 1 tablet by mouth daily. 16.Vitamin B12 over-the-counter 1 tablet by mouth daily. 17.Vitamin D over-the-counter 1 tablet by mouth twice daily. 18.Zetia 10 mg 1 tablet by mouth daily.  DISPOSITION:  [name] will be discharged home in stable condition. Recommend she is to increase her activity slowly.  It is recommended she is to eat low-sodium heart-healthy diet.  She may shower and bathe.  No lifting or driving for 2 days.  If catheter site becomes, red, painful, swollen, or discharges fluid or pus, she is to call our office immediately.  She will follow up with Dr. Clarene Duke approximately 1-2 weeks in our office.  We will call with the appointment time.    ______________________________ Wilburt Finlay, PA   ______________________________ Landry Corporal, MD    BH/MEDQ  D:  04/19/2011  T:  04/19/2011  Job:  [id number]  Electronically Signed by Wilburt Finlay PA on 04/22/2011 12:02:57 PM Electronically Signed by Bryan Lemma MD on 04/23/2011 10:44:46 PM

## 2011-04-24 ENCOUNTER — Telehealth (INDEPENDENT_AMBULATORY_CARE_PROVIDER_SITE_OTHER): Payer: Self-pay

## 2011-04-24 NOTE — Telephone Encounter (Signed)
Patient called to cancel her BE that is scheduled at [facility] for August 3rd.  She had colostomy surgery in February and was supposed to start planning her colostomy takedown.  Her cardiologist Dr Clarene Duke recently did a cardiac catheterization on her and he does not clear her for surgery right now.  Her husband died in early May 15, 2023 and she is not mentally or physically stable enough right now.  She may be able to proceed in October or November.  She and I discussed follow up in the office in late August or September.  Dr Clarene Duke doesn't think she can tolerate anesthesia.

## 2011-04-28 NOTE — Cardiovascular Report (Signed)
[name], [name]                  ACCOUNT NO.:  [id number]  MEDICAL RECORD NO.:  [id number]  LOCATION:  3742                         FACILITY:  [facility]  PHYSICIAN:  Thereasa Solo. Lydon Vansickle, M.D. DATE OF BIRTH:  [birth year]  DATE OF PROCEDURE:  04/17/2011 DATE OF DISCHARGE:                           CARDIAC CATHETERIZATION   This 76 year old diabetic female had had bypass surgery in 2005, by Dr. Donata Clay.  She has had problems with recurrent angina and had a non- STEMI in May 2012, in the setting of a perforated colon and colostomy surgery.  A cath at that time revealed high-grade stenosis in the proximal portion of the vein graft to the OM and this was stented. There was a previous stent in the ostium of the same vein graft from 2008, and in the mid portion of the vein graft from 2011.  The vein graft to the RCA and the vein graft to the diagonal had been occluded and she had a patent internal mammary artery.  Her husband had the acute onset of dementia and subsequently was placed on hospice about 10 days ago and died 1 week ago.  With the stress associated with his illness did in fact began having worsening chest pain.  Her chest pain, however, was associated with walking, was relieved with nitroglycerin and was clearly anginal.  She is brought in today for outpatient cardiac catheterization.  After obtaining informed consent, the patient was prepped and draped in the usual sterile fashion exposing the right groin.  Following local anesthetic with 1% Xylocaine the Seldinger technique was employed and a 5-French introducer sheath was placed in the right femoral artery.  Left and right coronary arteriography and ventriculography in the RAO projection and graft visualization x4 was performed.  COMPLICATIONS:  None.  TOTAL CONTRAST:  85 mL.  EQUIPMENT:  A 5-French Judkins configuration catheter and all grafts were evaluated with a JR-4 catheter.  RESULTS: 1. Hemodynamic Monitoring:   Her central aortic pressure was 101/47.     Her left ventricular pressure was 100/1 and there was no     significant valve gradient at the time of pullback. 2. Ventriculography.  Ventriculography in the RAO projection using 20     mL of contrast at 12 mL per second revealed normal systolic     function.  The ejection fraction was 55% and the end-diastolic     pressure was 13. 3. Coronary arteriography:  Calcification of the proximal portion of     the LAD and left main were noted.     a.     Left main 50% terminal narrowing.     b.     Circumflex ostial and proximal 60% narrowing with complete      occlusion of the circumflex and its midportion.  There were late      collateral filling of the OM system.     c.     LAD.  The proximal third of the LAD was rather diffusely      diseased.  There was bidirectional flow in the midportion of the      LAD as a result of the internal mammary artery to the  LAD.  There      was also left-to-right collaterals and there were left-to-left      collaterals.     d.     Right coronary artery 100% occluded in its midportion again      with the PDA being visualized by collaterals from the left.  GRAFTS: 1. Saphenous vein graft to the RCA 100% percent occluded. 2. Saphenous vein graft to the OM 100% occluded. 3. Saphenous vein graft to the diagonal 100% occluded. 4. Internal mammary artery to the LAD.  The internal mammary artery     was widely patent.  It inserted into the proximal portion of the     LAD and the LAD went down across the apex of the heart.  At the     apex of the heart was a 70% area of narrowing and there was like     what appeared to be retrograde filling from collaterals of the LAD     system.  CONCLUSIONS: 1. Normal LV systolic function. 2. Severe native disease. 3. Loss of all the vein grafts with only the internal mammary artery     being patent.  There are collaterals to the RCA, OM, and diagonal     system.  DISCUSSION:   There is clearly nothing I can treat percutaneously.  I asked Dr. Donata Clay to reassess her for consideration of redo surgery. The biggest question is whether or not the vessels that are visualized via the collaterals are adequate size for revascularization.  We will continue her on aggressive medical therapy, add in Ranexa 500 mg b.i.d. to her current medications and we will hold her Plavix for now in the event she does have redo surgery.  Dr. Donata Clay plans to see her in the early evening and I will keep her overnight waiting all day depending on his decision.          ______________________________ Thereasa Solo Shacoya Burkhammer, M.D.     ABL/MEDQ  D:  04/17/2011  T:  04/17/2011  Job:  [id number]  cc:   Dr. Clayton Lefort, M.D.  Electronically Signed by Julieanne Manson M.D. on 04/28/2011 03:40:17 PM

## 2011-04-30 ENCOUNTER — Encounter: Payer: Self-pay | Admitting: *Deleted

## 2011-05-08 ENCOUNTER — Other Ambulatory Visit (HOSPITAL_COMMUNITY): Payer: Medicare Other

## 2011-05-21 ENCOUNTER — Encounter (INDEPENDENT_AMBULATORY_CARE_PROVIDER_SITE_OTHER): Payer: Self-pay | Admitting: Surgery

## 2011-05-21 ENCOUNTER — Ambulatory Visit (INDEPENDENT_AMBULATORY_CARE_PROVIDER_SITE_OTHER): Payer: Medicare Other | Admitting: Surgery

## 2011-05-21 VITALS — BP 122/62 | HR 72 | Temp 96.8°F | Ht 66.0 in | Wt 149.4 lb

## 2011-05-21 DIAGNOSIS — K9400 Colostomy complication, unspecified: Secondary | ICD-10-CM | POA: Insufficient documentation

## 2011-05-21 DIAGNOSIS — IMO0002 Reserved for concepts with insufficient information to code with codable children: Secondary | ICD-10-CM

## 2011-05-21 NOTE — Patient Instructions (Signed)
Use StomaPaste to fill ulcer with each pouch change.  Call [facility] for appointment with stoma therapy nurse on Monday.

## 2011-05-21 NOTE — Progress Notes (Signed)
Visit Diagnoses: 1. Complication of colostomy, ulcer of skin     HISTORY:    PERTINENT REVIEW OF SYSTEMS:    EXAM: HEENT: normocephalic; pupils equal and reactive; sclerae clear; dentition good; mucous membranes moist NECK:  symmetric on extension; no palpable anterior or posterior cervical lymphadenopathy; no supraclavicular masses; no tenderness CHEST: clear to auscultation bilaterally without rales, rhonchi, or wheezes CARDIAC: regular rate and rhythm without significant murmur; peripheral pulses are full EXT:  non-tender without edema; no deformity NEURO: no gross focal deficits; no sign of tremor ABD:  Stoma in left lower quad.  Appliance removed.  Small ~1 cm ulcer medial to stoma, superficial, non-tender   IMPRESSION: 1 cm superficial ulcer medial to colostomy, no cellulitis   PLAN: Colostomy appliance is reapplied using stoma paste to feel the lesser prior to application. Patient is reassured that there is no sign of infection. Patient is referred to the stoma therapy nurse for further evaluation and recommendations. Patient will return to see Dr. Janee Morn in this office in one month.   April Heckler, MD, FACS General & Endocrine Surgery [facility]

## 2011-06-11 ENCOUNTER — Encounter: Payer: Self-pay | Admitting: Family Medicine

## 2011-06-11 ENCOUNTER — Inpatient Hospital Stay (HOSPITAL_COMMUNITY): Admission: RE | Admit: 2011-06-11 | Payer: Medicare Other | Source: Ambulatory Visit | Admitting: General Surgery

## 2011-06-24 ENCOUNTER — Ambulatory Visit (INDEPENDENT_AMBULATORY_CARE_PROVIDER_SITE_OTHER): Payer: Medicare Other | Admitting: Surgery

## 2011-06-24 ENCOUNTER — Ambulatory Visit (INDEPENDENT_AMBULATORY_CARE_PROVIDER_SITE_OTHER): Payer: Medicare Other | Admitting: General Surgery

## 2011-06-24 ENCOUNTER — Encounter (INDEPENDENT_AMBULATORY_CARE_PROVIDER_SITE_OTHER): Payer: Self-pay | Admitting: General Surgery

## 2011-06-24 ENCOUNTER — Encounter (INDEPENDENT_AMBULATORY_CARE_PROVIDER_SITE_OTHER): Payer: Medicare Other | Admitting: Surgery

## 2011-06-24 ENCOUNTER — Other Ambulatory Visit (INDEPENDENT_AMBULATORY_CARE_PROVIDER_SITE_OTHER): Payer: Self-pay | Admitting: General Surgery

## 2011-06-24 ENCOUNTER — Encounter (INDEPENDENT_AMBULATORY_CARE_PROVIDER_SITE_OTHER): Payer: Self-pay | Admitting: Surgery

## 2011-06-24 VITALS — BP 128/68 | HR 72 | Temp 96.6°F | Resp 14 | Ht 65.0 in | Wt 150.0 lb

## 2011-06-24 VITALS — Temp 97.9°F

## 2011-06-24 DIAGNOSIS — K219 Gastro-esophageal reflux disease without esophagitis: Secondary | ICD-10-CM

## 2011-06-24 DIAGNOSIS — Z853 Personal history of malignant neoplasm of breast: Secondary | ICD-10-CM

## 2011-06-24 DIAGNOSIS — Z933 Colostomy status: Secondary | ICD-10-CM

## 2011-06-24 NOTE — Progress Notes (Signed)
[name] presents today in followup for her right breast cancer. In the last year [name] had to do an emergency colectomy and colostomy for diverticulitis. Unfortunately she's having some cardiac issues with graft closure and they are worried that general anesthesia may be too risky for colostomy closure.  In addition, her husband died in early 2023-05-16. She has had a lot of her plate.  Her mammogram was normal bile by her report. We did not have a copy of that. Physical exam today shows no palpable masses or worrisome areas. Axillar without any palpable masses. Listen to her and reassured her and we will see her again in one year in followup.

## 2011-06-25 ENCOUNTER — Inpatient Hospital Stay (HOSPITAL_COMMUNITY): Admission: RE | Admit: 2011-06-25 | Payer: Medicare Other | Source: Ambulatory Visit | Admitting: General Surgery

## 2011-07-06 ENCOUNTER — Other Ambulatory Visit (INDEPENDENT_AMBULATORY_CARE_PROVIDER_SITE_OTHER): Payer: Medicare Other

## 2011-07-06 DIAGNOSIS — E119 Type 2 diabetes mellitus without complications: Secondary | ICD-10-CM

## 2011-07-06 DIAGNOSIS — K922 Gastrointestinal hemorrhage, unspecified: Secondary | ICD-10-CM

## 2011-07-06 LAB — CBC WITH DIFFERENTIAL/PLATELET
Basophils Absolute: 0 10*3/uL (ref 0.0–0.1)
Basophils Relative: 0.2 % (ref 0.0–3.0)
Eosinophils Absolute: 0.2 10*3/uL (ref 0.0–0.7)
Eosinophils Relative: 2.7 % (ref 0.0–5.0)
HCT: 31.5 % — ABNORMAL LOW (ref 36.0–46.0)
Hemoglobin: 10.5 g/dL — ABNORMAL LOW (ref 12.0–15.0)
Lymphocytes Relative: 19.2 % (ref 12.0–46.0)
Lymphs Abs: 1.3 10*3/uL (ref 0.7–4.0)
MCV: 93.3 fl (ref 78.0–100.0)
Monocytes Absolute: 0.7 10*3/uL (ref 0.1–1.0)
Monocytes Relative: 10.3 % (ref 3.0–12.0)
Neutro Abs: 4.6 10*3/uL (ref 1.4–7.7)
Neutrophils Relative %: 67.6 % (ref 43.0–77.0)
Platelets: 303 10*3/uL (ref 150.0–400.0)
RBC: 3.37 Mil/uL — ABNORMAL LOW (ref 3.87–5.11)
RDW: 15.4 % — ABNORMAL HIGH (ref 11.5–14.6)
WBC: 6.8 10*3/uL (ref 4.5–10.5)

## 2011-07-06 LAB — BASIC METABOLIC PANEL
BUN: 39 mg/dL — ABNORMAL HIGH (ref 6–23)
CO2: 25 mEq/L (ref 19–32)
Calcium: 9.3 mg/dL (ref 8.4–10.5)
Chloride: 106 mEq/L (ref 96–112)
Creatinine, Ser: 1.4 mg/dL — ABNORMAL HIGH (ref 0.4–1.2)
GFR: 40.4 mL/min — ABNORMAL LOW (ref >60.00)
Glucose, Bld: 97 mg/dL (ref 70–99)
Potassium: 4.9 mEq/L (ref 3.5–5.1)
Sodium: 139 mEq/L (ref 135–145)

## 2011-07-06 LAB — HEMOGLOBIN A1C: Hgb A1c MFr Bld: 7.2 % — ABNORMAL HIGH (ref 4.6–6.5)

## 2011-07-09 ENCOUNTER — Encounter: Payer: Self-pay | Admitting: Family Medicine

## 2011-07-09 ENCOUNTER — Ambulatory Visit (INDEPENDENT_AMBULATORY_CARE_PROVIDER_SITE_OTHER): Payer: Medicare Other | Admitting: Family Medicine

## 2011-07-09 VITALS — BP 120/72 | HR 68 | Temp 97.0°F | Wt 151.0 lb

## 2011-07-09 DIAGNOSIS — I10 Essential (primary) hypertension: Secondary | ICD-10-CM

## 2011-07-09 DIAGNOSIS — E119 Type 2 diabetes mellitus without complications: Secondary | ICD-10-CM

## 2011-07-09 DIAGNOSIS — D509 Iron deficiency anemia, unspecified: Secondary | ICD-10-CM

## 2011-07-09 DIAGNOSIS — K9400 Colostomy complication, unspecified: Secondary | ICD-10-CM

## 2011-07-09 DIAGNOSIS — K922 Gastrointestinal hemorrhage, unspecified: Secondary | ICD-10-CM

## 2011-07-09 DIAGNOSIS — R1033 Periumbilical pain: Secondary | ICD-10-CM

## 2011-07-09 DIAGNOSIS — IMO0002 Reserved for concepts with insufficient information to code with codable children: Secondary | ICD-10-CM

## 2011-07-09 DIAGNOSIS — Z23 Encounter for immunization: Secondary | ICD-10-CM

## 2011-07-09 NOTE — Assessment & Plan Note (Signed)
A1C slightly better. She has been trying to be very careful with intake and nos have improved.  Lab Results  Component Value Date   HGBA1C 7.2* 07/06/2011

## 2011-07-09 NOTE — Assessment & Plan Note (Signed)
Stable but continues with chronic anemia. Her nos are stable but look to be consistent with generalized Myelodysplasia which got her teared up as her husband had this and he had a difficult time with it. He had been given injection therapy which never really seemed to help. We will continue to follow and hold off Hematology referral until change occurs. Suggest taking some B12 and iron chronically daily.

## 2011-07-09 NOTE — Progress Notes (Signed)
  Subjective:    Patient ID: [name], female    DOB: 1935/08/27, 76 y.o.   MRN: [id number]  HPI Pt here for 6 month followup. Her husband died 05/11/11. He had multiple problems and was in the hospital for a while, eventually put in palliative care and succumbed on the third day there. Dr Clarene Duke put her in the hosp the next week and had a cath which diagnosed closure of her stents, not amenable to surgery. She is therefore being treated medically and she therefore cannot have the takedown surgery of her colostomy due to the cardiac situation. She feels reasonably well with no real tiredness or fatigue.  She is slightly down from her husband's demise but I honestly think she is doing well. She is able to relate the whole story of his hospitalization in a very straightforward and accurate manner.  She has no real complaints today. She has not seen any blood near the ostomy site recently and stools have been reasonably normal although she has had some difficulty with the pouch adhesion.    Review of SystemsNoncontributory except as above.       Objective:   Physical Exam  Constitutional: She appears well-developed and well-nourished. No distress.  HENT:  Head: Normocephalic and atraumatic.  Right Ear: External ear normal.  Left Ear: External ear normal.  Nose: Nose normal.  Mouth/Throat: Oropharynx is clear and moist. No oropharyngeal exudate.  Eyes: Conjunctivae and EOM are normal. Pupils are equal, round, and reactive to light.  Neck: Normal range of motion. Neck supple. No thyromegaly present.  Cardiovascular: Normal rate, regular rhythm and normal heart sounds.   Pulmonary/Chest: Effort normal and breath sounds normal. She has no wheezes. She has no rales.  Abdominal: Soft. Bowel sounds are normal. She exhibits no distension. There is no tenderness.  Lymphadenopathy:    She has no cervical adenopathy.  Skin: She is not diaphoretic.          Assessment & Plan:

## 2011-07-09 NOTE — Assessment & Plan Note (Signed)
Resolved

## 2011-07-09 NOTE — Assessment & Plan Note (Signed)
Good control. Cont curr meds. BP Readings from Last 3 Encounters:  07/09/11 120/72  06/24/11 128/68  05/21/11 122/62

## 2011-07-09 NOTE — Patient Instructions (Addendum)
Call in the New Year for appt with Dr Para March or Guittierrez. 45 mins spent with pt, grtr than 50% in counselling.

## 2011-07-23 LAB — COMPREHENSIVE METABOLIC PANEL
ALT: 15
AST: 29
Albumin: 3.7
Alkaline Phosphatase: 50
BUN: 36 — ABNORMAL HIGH
CO2: 22
Calcium: 9.2
Chloride: 106
Creatinine, Ser: 1.45 — ABNORMAL HIGH
GFR calc Af Amer: 43 — ABNORMAL LOW
GFR calc non Af Amer: 36 — ABNORMAL LOW
Glucose, Bld: 122 — ABNORMAL HIGH
Potassium: 4.4
Sodium: 137
Total Bilirubin: 0.4
Total Protein: 7

## 2011-07-23 LAB — I-STAT 8, (EC8 V) (CONVERTED LAB)
Acid-base deficit: 3 — ABNORMAL HIGH
BUN: 37 — ABNORMAL HIGH
Bicarbonate: 22.2
Chloride: 106
Glucose, Bld: 121 — ABNORMAL HIGH
HCT: 38
Hemoglobin: 12.9
Operator id: 277751
Potassium: 3.9
Sodium: 136
TCO2: 23
pCO2, Ven: 38.7 — ABNORMAL LOW
pH, Ven: 7.367 — ABNORMAL HIGH

## 2011-07-23 LAB — BASIC METABOLIC PANEL
BUN: 25 — ABNORMAL HIGH
BUN: 29 — ABNORMAL HIGH
BUN: 30 — ABNORMAL HIGH
CO2: 20
CO2: 21
CO2: 22
Calcium: 8.7
Calcium: 8.8
Calcium: 9
Chloride: 108
Chloride: 109
Chloride: 110
Creatinine, Ser: 1.19
Creatinine, Ser: 1.31 — ABNORMAL HIGH
Creatinine, Ser: 1.37 — ABNORMAL HIGH
GFR calc Af Amer: 46 — ABNORMAL LOW
GFR calc Af Amer: 49 — ABNORMAL LOW
GFR calc Af Amer: 54 — ABNORMAL LOW
GFR calc non Af Amer: 38 — ABNORMAL LOW
GFR calc non Af Amer: 40 — ABNORMAL LOW
GFR calc non Af Amer: 45 — ABNORMAL LOW
Glucose, Bld: 119 — ABNORMAL HIGH
Glucose, Bld: 143 — ABNORMAL HIGH
Glucose, Bld: 89
Potassium: 4.1
Potassium: 4.2
Potassium: 4.6
Sodium: 137
Sodium: 137
Sodium: 140

## 2011-07-23 LAB — CBC
HCT: 28.9 — ABNORMAL LOW
HCT: 29.3 — ABNORMAL LOW
HCT: 30.6 — ABNORMAL LOW
HCT: 31 — ABNORMAL LOW
HCT: 35.6 — ABNORMAL LOW
Hemoglobin: 10.3 — ABNORMAL LOW
Hemoglobin: 10.4 — ABNORMAL LOW
Hemoglobin: 11.9 — ABNORMAL LOW
Hemoglobin: 9.6 — ABNORMAL LOW
Hemoglobin: 9.8 — ABNORMAL LOW
MCHC: 33.1
MCHC: 33.4
MCHC: 33.4
MCHC: 33.4
MCHC: 33.5
MCV: 89.2
MCV: 89.6
MCV: 89.6
MCV: 90
MCV: 90.2
Platelets: 284
Platelets: 286
Platelets: 291
Platelets: 296
Platelets: 325
RBC: 3.23 — ABNORMAL LOW
RBC: 3.27 — ABNORMAL LOW
RBC: 3.43 — ABNORMAL LOW
RBC: 3.44 — ABNORMAL LOW
RBC: 3.96
RDW: 14.3 — ABNORMAL HIGH
RDW: 14.4 — ABNORMAL HIGH
RDW: 14.5 — ABNORMAL HIGH
RDW: 14.5 — ABNORMAL HIGH
RDW: 14.7 — ABNORMAL HIGH
WBC: 10.6 — ABNORMAL HIGH
WBC: 7.2
WBC: 8.4
WBC: 8.4
WBC: 9.4

## 2011-07-23 LAB — B-NATRIURETIC PEPTIDE (CONVERTED LAB): Pro B Natriuretic peptide (BNP): 369 — ABNORMAL HIGH

## 2011-07-23 LAB — POCT CARDIAC MARKERS
CKMB, poc: 10.9
CKMB, poc: 6.3
Myoglobin, poc: 156
Myoglobin, poc: 203
Operator id: 277751
Operator id: 279831
Troponin i, poc: 1.64
Troponin i, poc: 3.17

## 2011-07-23 LAB — PLATELET COUNT: Platelets: 314

## 2011-07-23 LAB — DIFFERENTIAL
Basophils Absolute: 0
Basophils Absolute: 0
Basophils Relative: 0
Basophils Relative: 0
Eosinophils Absolute: 0.2
Eosinophils Absolute: 0.3
Eosinophils Relative: 2
Eosinophils Relative: 4
Lymphocytes Relative: 18
Lymphocytes Relative: 20
Lymphs Abs: 1.7
Lymphs Abs: 1.9
Monocytes Absolute: 0.9 — ABNORMAL HIGH
Monocytes Absolute: 1.1 — ABNORMAL HIGH
Monocytes Relative: 10
Monocytes Relative: 11
Neutro Abs: 5.5
Neutro Abs: 7.4
Neutrophils Relative %: 65
Neutrophils Relative %: 69

## 2011-07-23 LAB — T3: T3, Total: 161.1

## 2011-07-23 LAB — CARDIAC PANEL(CRET KIN+CKTOT+MB+TROPI)
CK, MB: 12.6 — ABNORMAL HIGH
CK, MB: 3.6
CK, MB: 31.7 — ABNORMAL HIGH
CK, MB: 8.3 — ABNORMAL HIGH
Relative Index: 3.4 — ABNORMAL HIGH
Relative Index: 4.9 — ABNORMAL HIGH
Relative Index: 5.8 — ABNORMAL HIGH
Relative Index: 9 — ABNORMAL HIGH
Total CK: 106
Total CK: 171
Total CK: 217 — ABNORMAL HIGH
Total CK: 352 — ABNORMAL HIGH
Troponin I: 12.59
Troponin I: 3.52
Troponin I: 4.37
Troponin I: 8.3

## 2011-07-23 LAB — LIPID PANEL
Cholesterol: 149
HDL: 41
LDL Cholesterol: 77
Total CHOL/HDL Ratio: 3.6
Triglycerides: 155 — ABNORMAL HIGH
VLDL: 31

## 2011-07-23 LAB — TSH
TSH: 3.19
TSH: 6.679 — ABNORMAL HIGH

## 2011-07-23 LAB — POCT I-STAT CREATININE
Creatinine, Ser: 1.6 — ABNORMAL HIGH
Operator id: 277751

## 2011-07-23 LAB — PROTIME-INR
INR: 0.9
Prothrombin Time: 12.5

## 2011-07-23 LAB — CK TOTAL AND CKMB (NOT AT ARMC)
CK, MB: 10.2 — ABNORMAL HIGH
Relative Index: 5.9 — ABNORMAL HIGH
Total CK: 174

## 2011-07-23 LAB — TROPONIN I: Troponin I: 3.3

## 2011-07-23 LAB — HEPARIN LEVEL (UNFRACTIONATED): Heparin Unfractionated: <0.1 — ABNORMAL LOW

## 2011-07-23 LAB — APTT: aPTT: 38 — ABNORMAL HIGH

## 2011-07-23 LAB — T4: T4, Total: 11.9

## 2011-07-23 LAB — HEMOGLOBIN A1C: Hgb A1c MFr Bld: 7 — ABNORMAL HIGH

## 2011-08-11 ENCOUNTER — Other Ambulatory Visit: Payer: Self-pay | Admitting: Family Medicine

## 2011-09-30 ENCOUNTER — Encounter (INDEPENDENT_AMBULATORY_CARE_PROVIDER_SITE_OTHER): Payer: Self-pay | Admitting: General Surgery

## 2011-09-30 ENCOUNTER — Ambulatory Visit (INDEPENDENT_AMBULATORY_CARE_PROVIDER_SITE_OTHER): Payer: Medicare Other | Admitting: General Surgery

## 2011-09-30 VITALS — BP 134/74 | HR 68 | Temp 98.2°F | Resp 16 | Ht 66.0 in | Wt 151.0 lb

## 2011-09-30 DIAGNOSIS — B369 Superficial mycosis, unspecified: Secondary | ICD-10-CM

## 2011-09-30 DIAGNOSIS — B354 Tinea corporis: Secondary | ICD-10-CM

## 2011-09-30 DIAGNOSIS — Z933 Colostomy status: Secondary | ICD-10-CM

## 2011-09-30 MED ORDER — NYSTATIN 100000 UNIT/GM EX POWD: CUTANEOUS | Status: AC

## 2011-09-30 NOTE — Progress Notes (Signed)
Subjective:     Patient ID: [name], female   DOB: Sep 22, 1935, 76 y.o.   MRN: [id number]  HPI Patient has a colostomy in place after colectomy. Her coronary artery disease is too severe to allow takedown of her colostomy. She is learning how to handle this better. She does complain of a rash on the fold of her pannus. This is itchy and irritated.  Review of Systems     Objective:   Physical Exam  Constitutional: She appears well-developed and well-nourished.  Neck: Normal range of motion. Neck supple.  Pulmonary/Chest: Effort normal and breath sounds normal.   Abdomen is soft and nontender. Midline incision is well-healed. Ostomy is pink and functional. She has what appears to be a fungal rash along her abdominal crease in the lower part. The cellulitis.    Assessment:     Colostomy in place. A fungal skin infection     Plan:     Nystatin powder. We will have her followup as needed.

## 2011-11-25 ENCOUNTER — Other Ambulatory Visit: Payer: Self-pay

## 2011-11-25 MED ORDER — CLOPIDOGREL BISULFATE 75 MG PO TABS: 75.0000 mg | ORAL_TABLET | Freq: Every day | ORAL | Status: DC

## 2011-11-25 NOTE — Telephone Encounter (Signed)
Fax refill request received from patient's pharmacy

## 2011-12-10 ENCOUNTER — Other Ambulatory Visit: Payer: Self-pay | Admitting: *Deleted

## 2011-12-10 MED ORDER — PIOGLITAZONE HCL 45 MG PO TABS: 45.0000 mg | ORAL_TABLET | Freq: Every day | ORAL | Status: DC

## 2011-12-23 ENCOUNTER — Other Ambulatory Visit: Payer: Self-pay | Admitting: Family Medicine

## 2011-12-31 ENCOUNTER — Other Ambulatory Visit: Payer: Self-pay | Admitting: Family Medicine

## 2012-01-07 ENCOUNTER — Ambulatory Visit (INDEPENDENT_AMBULATORY_CARE_PROVIDER_SITE_OTHER): Payer: Medicare Other | Admitting: Family Medicine

## 2012-01-07 ENCOUNTER — Encounter: Payer: Self-pay | Admitting: Family Medicine

## 2012-01-07 ENCOUNTER — Telehealth: Payer: Self-pay | Admitting: Family Medicine

## 2012-01-07 VITALS — BP 126/66 | HR 64 | Wt 154.8 lb

## 2012-01-07 DIAGNOSIS — E785 Hyperlipidemia, unspecified: Secondary | ICD-10-CM | POA: Diagnosis not present

## 2012-01-07 DIAGNOSIS — I10 Essential (primary) hypertension: Secondary | ICD-10-CM

## 2012-01-07 DIAGNOSIS — I4891 Unspecified atrial fibrillation: Secondary | ICD-10-CM

## 2012-01-07 DIAGNOSIS — E119 Type 2 diabetes mellitus without complications: Secondary | ICD-10-CM

## 2012-01-07 DIAGNOSIS — I2510 Atherosclerotic heart disease of native coronary artery without angina pectoris: Secondary | ICD-10-CM

## 2012-01-07 DIAGNOSIS — M810 Age-related osteoporosis without current pathological fracture: Secondary | ICD-10-CM

## 2012-01-07 NOTE — Telephone Encounter (Signed)
Patient advised.  She says she will speak with you about it when she comes back in 4 months for follow up.

## 2012-01-07 NOTE — Assessment & Plan Note (Signed)
Given duration of treatment, no need to continue fosamax.  Would only proceed with dxa if she were willing to undergo treatment for low density.  Will notify pt re: options.

## 2012-01-07 NOTE — Assessment & Plan Note (Signed)
Controlled, not tachy, no CP.  Per cards.

## 2012-01-07 NOTE — Patient Instructions (Addendum)
The next time you come in, bring a copy of your sliding scale dose for insulin.   If you have fevers or rash at the tick site, let me know.  It looks fine now.  Don't change your meds for now except for cutting out the nighttime metformin.   We'll contact you with your lab report. Recheck in 4 months with labs ahead of time.

## 2012-01-07 NOTE — Progress Notes (Signed)
Dr. Janee Morn with [facility] Dr. Clarene Duke with cards Dr. Adolphus Birchwood with derm  H/o osteoporosis, off meds now.  Had been on fosamax for years prev, stopped med in 2010.    Colectomy, partial, for diverticulitis.  Has had f/u with [facility] and is doing well.  No abd pain.  No abnormal contents of colostomy.    CAD s/p stent and CABD.  H/o AF.  Not on coumadin.  No CP.  Not sob.  No ble edema except in L ankle occ.  Feeling well.  Due for labs.    Diabetes: Using medications without difficulties: yes Hypoglycemic episodes: occ, nocturnal, rare Hyperglycemic episodes:no Feet problems:no Blood Sugars averaging: ~70  Hypertension:    Using medication without problems or lightheadedness: yes Chest pain with exertion:no Edema: occ in L ankle, at baseline Short of breath:no Average home BPs: similar to day  Elevated Cholesterol: Using medications without problems:yes Muscle aches: no Diet compliance:yes Exercise: some, limited by colostomy  She had a tick bite on L side of posterior neck, removed completely and no sx from this.    PMH and SH reviewed  Meds, vitals, and allergies reviewed.   ROS: See HPI.  Otherwise negative.    GEN: nad, alert and oriented HEENT: mucous membranes moist NECK: supple w/o LA CV: rrr with occ ectopy, not tachy. Midline sternotomy noted.  PULM: ctab, no inc wob ABD: soft, +bs EXT: trace edema SKIN: no acute rash  Diabetic foot exam: Normal inspection No skin breakdown No calluses  Decreased DP pulses x2 Normal sensation to light touch and monofilament

## 2012-01-07 NOTE — Assessment & Plan Note (Signed)
CP free, see notes on labs.  No change in meds.

## 2012-01-07 NOTE — Telephone Encounter (Signed)
Call pt.  I checked old records.  Was on fosamax for years, with some improvement in DXA.  Given duration of treatment, no need to continue fosamax at this point.  Would only proceed with dxa if she were willing to undergo treatment is she had very low density (ie prolia).  It doesn't appear that f/u DXA is mandatory.  I will defer to her preference.  Thanks.

## 2012-01-07 NOTE — Assessment & Plan Note (Signed)
Stop nighttime metformin given the low sugars.  See notes on labs.

## 2012-01-07 NOTE — Assessment & Plan Note (Signed)
Continue meds, see notes on labs.  

## 2012-01-07 NOTE — Assessment & Plan Note (Signed)
Controlled.  

## 2012-01-12 ENCOUNTER — Ambulatory Visit: Payer: Medicare Other | Admitting: Family Medicine

## 2012-01-13 ENCOUNTER — Other Ambulatory Visit (INDEPENDENT_AMBULATORY_CARE_PROVIDER_SITE_OTHER): Payer: Medicare Other

## 2012-01-13 DIAGNOSIS — E119 Type 2 diabetes mellitus without complications: Secondary | ICD-10-CM

## 2012-01-13 LAB — COMPREHENSIVE METABOLIC PANEL
ALT: 13 U/L (ref 0–35)
AST: 14 U/L (ref 0–37)
Albumin: 4 g/dL (ref 3.5–5.2)
Alkaline Phosphatase: 41 U/L (ref 39–117)
BUN: 43 mg/dL — ABNORMAL HIGH (ref 6–23)
CO2: 23 mEq/L (ref 19–32)
Calcium: 9.3 mg/dL (ref 8.4–10.5)
Chloride: 107 mEq/L (ref 96–112)
Creatinine, Ser: 1.6 mg/dL — ABNORMAL HIGH (ref 0.4–1.2)
GFR: 34.18 mL/min — ABNORMAL LOW (ref >60.00)
Glucose, Bld: 113 mg/dL — ABNORMAL HIGH (ref 70–99)
Potassium: 5.4 mEq/L — ABNORMAL HIGH (ref 3.5–5.1)
Sodium: 140 mEq/L (ref 135–145)
Total Bilirubin: 0.3 mg/dL (ref 0.3–1.2)
Total Protein: 6.7 g/dL (ref 6.0–8.3)

## 2012-01-13 LAB — LIPID PANEL
Cholesterol: 133 mg/dL (ref 0–200)
HDL: 42.8 mg/dL (ref >39.00)
LDL Cholesterol: 60 mg/dL (ref 0–99)
Total CHOL/HDL Ratio: 3
Triglycerides: 151 mg/dL — ABNORMAL HIGH (ref 0.0–149.0)
VLDL: 30.2 mg/dL (ref 0.0–40.0)

## 2012-01-13 LAB — TSH: TSH: 4.97 u[IU]/mL (ref 0.35–5.50)

## 2012-01-15 ENCOUNTER — Telehealth: Payer: Self-pay | Admitting: Family Medicine

## 2012-01-15 DIAGNOSIS — E875 Hyperkalemia: Secondary | ICD-10-CM

## 2012-01-15 LAB — HEMOGLOBIN A1C: Hgb A1c MFr Bld: 7.5 % — ABNORMAL HIGH (ref 4.6–6.5)

## 2012-01-15 NOTE — Telephone Encounter (Signed)
Patient advised.  Lab appt scheduled.  

## 2012-01-15 NOTE — Telephone Encounter (Signed)
Call pt.  A1c and lipids are okay.  Creatinine and potassium are both elevated.  Avoid high potassium foods and make sure she is well hydrated.  Recheck BMET Monday.  Thanks.  Orders are in.

## 2012-01-18 ENCOUNTER — Telehealth: Payer: Self-pay | Admitting: Family Medicine

## 2012-01-18 ENCOUNTER — Other Ambulatory Visit (INDEPENDENT_AMBULATORY_CARE_PROVIDER_SITE_OTHER): Payer: Medicare Other

## 2012-01-18 ENCOUNTER — Other Ambulatory Visit: Payer: Self-pay | Admitting: Family Medicine

## 2012-01-18 DIAGNOSIS — E875 Hyperkalemia: Secondary | ICD-10-CM

## 2012-01-18 LAB — BASIC METABOLIC PANEL
BUN: 47 mg/dL — ABNORMAL HIGH (ref 6–23)
CO2: 22 mEq/L (ref 19–32)
Calcium: 9.2 mg/dL (ref 8.4–10.5)
Creatinine, Ser: 1.6 mg/dL — ABNORMAL HIGH (ref 0.4–1.2)
GFR: 34.44 mL/min — ABNORMAL LOW (ref >60.00)
Glucose, Bld: 155 mg/dL — ABNORMAL HIGH (ref 70–99)
Potassium: 4.9 mEq/L (ref 3.5–5.1)
Sodium: 139 mEq/L (ref 135–145)

## 2012-01-18 MED ORDER — GLYBURIDE 5 MG PO TABS: 10.0000 mg | ORAL_TABLET | Freq: Every day | ORAL | Status: DC

## 2012-01-18 NOTE — Telephone Encounter (Signed)
Call pt.  High K is resolved, but Cr is still elevated some (enough to need to stop metformin totally).  Stop the glyburide metformin combination.  Change to plain glyburide.  Have her call back with AM sugars over the next week along with her sliding scale dose.  Thanks.  rx for glyburide sent.

## 2012-01-19 NOTE — Telephone Encounter (Signed)
Patient advised.

## 2012-01-28 ENCOUNTER — Telehealth: Payer: Self-pay | Admitting: Family Medicine

## 2012-01-28 MED ORDER — INSULIN LISPRO 100 UNIT/ML ~~LOC~~ SOLN: SUBCUTANEOUS | Status: DC

## 2012-01-28 NOTE — Telephone Encounter (Signed)
Caller: Jacci/Patient; PCP: Crawford Givens Clelia Croft); CB#: [phone];  Call regarding today 01/28/12 was seen in office 2 weeks ago by Dr. Para March.  [place]ene called her 01/19/12 and said Dr. Para March wanted to her to keep her blood sugars for about 5 days along with her sliding scale and call them in to triage nurse.  Results are as follows: 01/22/12 - 111, 01/23/12 - 112, 01/24/12 - 97, 01/25/12 - 115, 01/26/12 - 94, 01/27/12 - 100, 01/28/12 - 112.  All blood sugars were taken before eating in the AM.  Sliding scale is 200-250 = 4-5 Units, 251-300 = 5-6 Units, 301-350 = 6-7 Units.

## 2012-01-28 NOTE — Telephone Encounter (Signed)
Call pt. Sugar is AM on these readings.  Continue as is, recheck labs in 8/13.  Call back if consistent significant elevation in AM sugars (ie consistently >150 in AM).  Thanks.

## 2012-01-29 NOTE — Telephone Encounter (Signed)
Patient advised.

## 2012-02-01 ENCOUNTER — Other Ambulatory Visit: Payer: Self-pay | Admitting: Family Medicine

## 2012-02-02 ENCOUNTER — Other Ambulatory Visit: Payer: Self-pay | Admitting: Family Medicine

## 2012-02-02 DIAGNOSIS — H2510 Age-related nuclear cataract, unspecified eye: Secondary | ICD-10-CM | POA: Diagnosis not present

## 2012-02-02 DIAGNOSIS — Z961 Presence of intraocular lens: Secondary | ICD-10-CM | POA: Diagnosis not present

## 2012-02-03 NOTE — Telephone Encounter (Signed)
Sent!

## 2012-02-03 NOTE — Telephone Encounter (Signed)
Received refill request electronically from pharmacy. See sliding scales directions. Is it okay to refill medication?

## 2012-02-05 ENCOUNTER — Other Ambulatory Visit: Payer: Self-pay | Admitting: Family Medicine

## 2012-02-05 DIAGNOSIS — I2510 Atherosclerotic heart disease of native coronary artery without angina pectoris: Secondary | ICD-10-CM | POA: Diagnosis not present

## 2012-02-05 DIAGNOSIS — E782 Mixed hyperlipidemia: Secondary | ICD-10-CM | POA: Diagnosis not present

## 2012-02-05 DIAGNOSIS — I10 Essential (primary) hypertension: Secondary | ICD-10-CM | POA: Diagnosis not present

## 2012-02-05 DIAGNOSIS — E119 Type 2 diabetes mellitus without complications: Secondary | ICD-10-CM | POA: Diagnosis not present

## 2012-02-06 ENCOUNTER — Other Ambulatory Visit: Payer: Self-pay | Admitting: Family Medicine

## 2012-02-10 ENCOUNTER — Other Ambulatory Visit: Payer: Self-pay | Admitting: Family Medicine

## 2012-02-20 ENCOUNTER — Other Ambulatory Visit: Payer: Self-pay | Admitting: Family Medicine

## 2012-02-22 NOTE — Telephone Encounter (Signed)
This medication is not currently on patient's meds list.  Please advise.

## 2012-02-22 NOTE — Telephone Encounter (Signed)
Denied.  Med was stopped.

## 2012-02-25 ENCOUNTER — Other Ambulatory Visit: Payer: Self-pay | Admitting: Family Medicine

## 2012-02-25 NOTE — Telephone Encounter (Signed)
Sent!

## 2012-02-25 NOTE — Telephone Encounter (Signed)
Electronic refill request

## 2012-03-07 ENCOUNTER — Other Ambulatory Visit: Payer: Self-pay | Admitting: *Deleted

## 2012-03-07 MED ORDER — CLOPIDOGREL BISULFATE 75 MG PO TABS: 75.0000 mg | ORAL_TABLET | Freq: Every day | ORAL | Status: DC

## 2012-03-07 MED ORDER — PIOGLITAZONE HCL 45 MG PO TABS: 45.0000 mg | ORAL_TABLET | Freq: Every day | ORAL | Status: DC

## 2012-03-07 NOTE — Telephone Encounter (Signed)
Requests 3 month supply.  Please help with quantity.

## 2012-03-08 MED ORDER — INSULIN LISPRO 100 UNIT/ML ~~LOC~~ SOLN: SUBCUTANEOUS | Status: DC

## 2012-03-08 NOTE — Telephone Encounter (Signed)
Sent!

## 2012-05-03 DIAGNOSIS — I10 Essential (primary) hypertension: Secondary | ICD-10-CM | POA: Diagnosis not present

## 2012-05-03 DIAGNOSIS — I2510 Atherosclerotic heart disease of native coronary artery without angina pectoris: Secondary | ICD-10-CM | POA: Diagnosis not present

## 2012-05-03 DIAGNOSIS — E782 Mixed hyperlipidemia: Secondary | ICD-10-CM | POA: Diagnosis not present

## 2012-05-05 ENCOUNTER — Other Ambulatory Visit (INDEPENDENT_AMBULATORY_CARE_PROVIDER_SITE_OTHER): Payer: Medicare Other

## 2012-05-05 DIAGNOSIS — E119 Type 2 diabetes mellitus without complications: Secondary | ICD-10-CM | POA: Diagnosis not present

## 2012-05-05 LAB — HEMOGLOBIN A1C: Hgb A1c MFr Bld: 7.5 % — ABNORMAL HIGH (ref 4.6–6.5)

## 2012-05-10 ENCOUNTER — Ambulatory Visit: Payer: Medicare Other | Admitting: Family Medicine

## 2012-05-12 ENCOUNTER — Ambulatory Visit (INDEPENDENT_AMBULATORY_CARE_PROVIDER_SITE_OTHER): Payer: Medicare Other | Admitting: Family Medicine

## 2012-05-12 ENCOUNTER — Encounter: Payer: Self-pay | Admitting: Family Medicine

## 2012-05-12 ENCOUNTER — Telehealth: Payer: Self-pay | Admitting: *Deleted

## 2012-05-12 VITALS — BP 118/58 | HR 65 | Temp 98.3°F | Wt 163.0 lb

## 2012-05-12 DIAGNOSIS — Z1231 Encounter for screening mammogram for malignant neoplasm of breast: Secondary | ICD-10-CM

## 2012-05-12 DIAGNOSIS — E119 Type 2 diabetes mellitus without complications: Secondary | ICD-10-CM | POA: Diagnosis not present

## 2012-05-12 DIAGNOSIS — Z853 Personal history of malignant neoplasm of breast: Secondary | ICD-10-CM

## 2012-05-12 NOTE — Telephone Encounter (Signed)
Cancelled and reordered.  Thanks.

## 2012-05-12 NOTE — Progress Notes (Signed)
Diabetes:  Using medications without difficulties: yes Hypoglycemic episodes: very rare Hyperglycemic episodes:no Feet problems:no Blood Sugars averaging: 100-120 in AM She's using sliding scale. A1c stable at 7.5.   She's eating out and and adjusting her insulin accordingly.    Meds, vitals, and allergies reviewed.   ROS: See HPI.  Otherwise negative.    GEN: nad, alert and oriented HEENT: mucous membranes moist NECK: supple w/o LA CV: occ ectopy noted PULM: ctab, no inc wob ABD: soft, +bs EXT: no edema SKIN: no acute rash  Diabetic foot exam: Normal inspection No skin breakdown No calluses  Normal DP pulses Dec sensation to light touch and monofilament on the toes distally Nails normal

## 2012-05-12 NOTE — Patient Instructions (Addendum)
I would get a flu shot each fall.   Come back for labs in 3 months before a 30 min visit with Para March.  See the front office about your mammogram before you leave today. Don't change your meds for now.

## 2012-05-12 NOTE — Assessment & Plan Note (Signed)
A1c unchanged, will continue current meds and she'll work on diet.  Recheck in 3-4 months.  She agrees.  Labs d/w pt.

## 2012-05-12 NOTE — Telephone Encounter (Signed)
The Radiology Tech from [facility] called to say that [name] has been scheduled for a screening mammogram and wanted to inform you that she had a diagnostic mammogram last year with notation made to follow up with a diagnostic.  If you would like to have a diagnostic, please cancel the original order and order diagnostic instead and I will fax it to them.  This will mean that the patient will need to come at another time than first scheduled but they will reschedule the patient.

## 2012-05-17 DIAGNOSIS — Z853 Personal history of malignant neoplasm of breast: Secondary | ICD-10-CM | POA: Diagnosis not present

## 2012-05-17 DIAGNOSIS — N6489 Other specified disorders of breast: Secondary | ICD-10-CM | POA: Diagnosis not present

## 2012-05-17 DIAGNOSIS — Z09 Encounter for follow-up examination after completed treatment for conditions other than malignant neoplasm: Secondary | ICD-10-CM | POA: Diagnosis not present

## 2012-05-18 ENCOUNTER — Encounter: Payer: Self-pay | Admitting: Family Medicine

## 2012-05-19 ENCOUNTER — Encounter: Payer: Self-pay | Admitting: *Deleted

## 2012-05-23 DIAGNOSIS — L80 Vitiligo: Secondary | ICD-10-CM | POA: Diagnosis not present

## 2012-06-14 DIAGNOSIS — Z23 Encounter for immunization: Secondary | ICD-10-CM | POA: Diagnosis not present

## 2012-08-08 ENCOUNTER — Other Ambulatory Visit (INDEPENDENT_AMBULATORY_CARE_PROVIDER_SITE_OTHER): Payer: Medicare Other

## 2012-08-08 DIAGNOSIS — E119 Type 2 diabetes mellitus without complications: Secondary | ICD-10-CM

## 2012-08-08 LAB — CBC WITH DIFFERENTIAL/PLATELET
Basophils Absolute: 0 10*3/uL (ref 0.0–0.1)
Basophils Relative: 0.4 % (ref 0.0–3.0)
Eosinophils Absolute: 0.1 10*3/uL (ref 0.0–0.7)
Eosinophils Relative: 2.6 % (ref 0.0–5.0)
HCT: 32.7 % — ABNORMAL LOW (ref 36.0–46.0)
Hemoglobin: 10.8 g/dL — ABNORMAL LOW (ref 12.0–15.0)
Lymphocytes Relative: 26.3 % (ref 12.0–46.0)
Lymphs Abs: 1.5 10*3/uL (ref 0.7–4.0)
MCHC: 32.9 g/dL (ref 30.0–36.0)
MCV: 96.5 fl (ref 78.0–100.0)
Monocytes Absolute: 0.7 10*3/uL (ref 0.1–1.0)
Monocytes Relative: 12.8 % — ABNORMAL HIGH (ref 3.0–12.0)
Neutro Abs: 3.3 10*3/uL (ref 1.4–7.7)
Neutrophils Relative %: 57.9 % (ref 43.0–77.0)
Platelets: 227 10*3/uL (ref 150.0–400.0)
RBC: 3.39 Mil/uL — ABNORMAL LOW (ref 3.87–5.11)
WBC: 5.7 10*3/uL (ref 4.5–10.5)

## 2012-08-08 LAB — BASIC METABOLIC PANEL
BUN: 39 mg/dL — ABNORMAL HIGH (ref 6–23)
CO2: 24 mEq/L (ref 19–32)
Calcium: 8.9 mg/dL (ref 8.4–10.5)
Chloride: 108 mEq/L (ref 96–112)
Creatinine, Ser: 1.6 mg/dL — ABNORMAL HIGH (ref 0.4–1.2)
GFR: 34.64 mL/min — ABNORMAL LOW (ref >60.00)
Glucose, Bld: 106 mg/dL — ABNORMAL HIGH (ref 70–99)
Potassium: 5.1 mEq/L (ref 3.5–5.1)
Sodium: 138 mEq/L (ref 135–145)

## 2012-08-08 LAB — HEMOGLOBIN A1C: Hgb A1c MFr Bld: 8.1 % — ABNORMAL HIGH (ref 4.6–6.5)

## 2012-08-09 ENCOUNTER — Other Ambulatory Visit: Payer: Self-pay | Admitting: Family Medicine

## 2012-08-12 ENCOUNTER — Encounter: Payer: Self-pay | Admitting: Family Medicine

## 2012-08-12 ENCOUNTER — Ambulatory Visit (INDEPENDENT_AMBULATORY_CARE_PROVIDER_SITE_OTHER): Payer: Medicare Other | Admitting: Family Medicine

## 2012-08-12 VITALS — BP 122/66 | HR 64 | Temp 97.4°F | Wt 163.8 lb

## 2012-08-12 DIAGNOSIS — I2510 Atherosclerotic heart disease of native coronary artery without angina pectoris: Secondary | ICD-10-CM

## 2012-08-12 DIAGNOSIS — D509 Iron deficiency anemia, unspecified: Secondary | ICD-10-CM

## 2012-08-12 DIAGNOSIS — Z933 Colostomy status: Secondary | ICD-10-CM

## 2012-08-12 DIAGNOSIS — E119 Type 2 diabetes mellitus without complications: Secondary | ICD-10-CM

## 2012-08-12 NOTE — Progress Notes (Signed)
Diabetes:  Using medications without difficulties:yes Hypoglycemic episodes: no Hyperglycemic episodes: occ Feet problems: no Blood Sugars averaging: 115 this AM.  Usually 90-115 in AM.   A1c up to 8.1. She admits to dietary indiscretion.   CAD.  She is off imdur and has been off for months w/o CP. We discussed.  BP controlled and she'll ask cardiology about this at f/u.    Anemia.  Likely early MDS but has been stable by labs.  No blood loss known per patient.  Continues on iron.  HGB stable, labs d/w pt.  She is worried about this given her deceased husband's history.   CKD with Cr stable.  On ACE.  D/w pt about this today.  See plan.   PMH and SH reviewed  Meds, vitals, and allergies reviewed.   ROS: See HPI.  Otherwise negative.    GEN: nad, alert and oriented HEENT: mucous membranes moist NECK: supple w/o LA CV: rrr. PULM: ctab, no inc wob ABD: soft, +bs, soft hernia noted around colostomy site on abd EXT: no edema SKIN: no acute rash  Diabetic foot exam: Normal inspection No skin breakdown No calluses  Dec in DP pulses Normal sensation to light touch and monofilament Nails normal

## 2012-08-12 NOTE — Assessment & Plan Note (Signed)
With hernia noted, she would not likely be a candidate for repair. Will follow.  If more bothersome or if tender, she'll notify us.

## 2012-08-12 NOTE — Assessment & Plan Note (Signed)
Vs early MDS.  Continue as is and recheck periodically.  She would like to avoid heme referral as long as counts are stable.  This is reasonable.  Okay to add on MVI.

## 2012-08-12 NOTE — Patient Instructions (Addendum)
Ask cardiology about getting back on the isosorbide.  See what they say.  Work on [employer] and recheck with Para March in 3 months.  Labs (A1c) ahead of time.  Take care.

## 2012-08-12 NOTE — Assessment & Plan Note (Signed)
She admits to diet indiscretion.  Will work on diet and recheck A1c in 3 months.  She agrees, no change in med. D/w pt about down stream effects of uncontrolled DM2.

## 2012-08-12 NOTE — Assessment & Plan Note (Signed)
No CP, will defer imdur to cards.

## 2012-08-12 NOTE — Assessment & Plan Note (Signed)
Cr stable and will follow.  She would like to hold off on renal referral as long as cr stable.  Okay for routine f/u.  Continue to work on DM2; BP controlled today.

## 2012-09-13 DIAGNOSIS — I10 Essential (primary) hypertension: Secondary | ICD-10-CM | POA: Diagnosis not present

## 2012-09-13 DIAGNOSIS — I2510 Atherosclerotic heart disease of native coronary artery without angina pectoris: Secondary | ICD-10-CM | POA: Diagnosis not present

## 2012-09-13 DIAGNOSIS — E119 Type 2 diabetes mellitus without complications: Secondary | ICD-10-CM | POA: Diagnosis not present

## 2012-09-13 DIAGNOSIS — Z951 Presence of aortocoronary bypass graft: Secondary | ICD-10-CM | POA: Diagnosis not present

## 2012-10-09 ENCOUNTER — Inpatient Hospital Stay (HOSPITAL_COMMUNITY)
Admission: EM | Admit: 2012-10-09 | Discharge: 2012-10-12 | DRG: 698 | Disposition: A | Discharge status: Home / Self Care | Payer: Medicare Other | Attending: Internal Medicine | Admitting: Internal Medicine

## 2012-10-09 ENCOUNTER — Encounter (HOSPITAL_COMMUNITY): Payer: Self-pay | Admitting: Family Medicine

## 2012-10-09 ENCOUNTER — Emergency Department (HOSPITAL_COMMUNITY): Payer: Medicare Other

## 2012-10-09 DIAGNOSIS — R142 Eructation: Secondary | ICD-10-CM | POA: Diagnosis not present

## 2012-10-09 DIAGNOSIS — K8020 Calculus of gallbladder without cholecystitis without obstruction: Secondary | ICD-10-CM | POA: Diagnosis not present

## 2012-10-09 DIAGNOSIS — I999 Unspecified disorder of circulatory system: Secondary | ICD-10-CM | POA: Diagnosis present

## 2012-10-09 DIAGNOSIS — R1032 Left lower quadrant pain: Secondary | ICD-10-CM | POA: Diagnosis not present

## 2012-10-09 DIAGNOSIS — Z7982 Long term (current) use of aspirin: Secondary | ICD-10-CM | POA: Diagnosis not present

## 2012-10-09 DIAGNOSIS — N183 Chronic kidney disease, stage 3 unspecified: Secondary | ICD-10-CM | POA: Diagnosis present

## 2012-10-09 DIAGNOSIS — K2990 Gastroduodenitis, unspecified, without bleeding: Secondary | ICD-10-CM | POA: Diagnosis not present

## 2012-10-09 DIAGNOSIS — I10 Essential (primary) hypertension: Secondary | ICD-10-CM | POA: Diagnosis present

## 2012-10-09 DIAGNOSIS — E1169 Type 2 diabetes mellitus with other specified complication: Secondary | ICD-10-CM | POA: Diagnosis present

## 2012-10-09 DIAGNOSIS — Z8249 Family history of ischemic heart disease and other diseases of the circulatory system: Secondary | ICD-10-CM

## 2012-10-09 DIAGNOSIS — Z79899 Other long term (current) drug therapy: Secondary | ICD-10-CM

## 2012-10-09 DIAGNOSIS — R52 Pain, unspecified: Secondary | ICD-10-CM

## 2012-10-09 DIAGNOSIS — R109 Unspecified abdominal pain: Secondary | ICD-10-CM | POA: Diagnosis present

## 2012-10-09 DIAGNOSIS — M810 Age-related osteoporosis without current pathological fracture: Secondary | ICD-10-CM | POA: Diagnosis present

## 2012-10-09 DIAGNOSIS — Z7902 Long term (current) use of antithrombotics/antiplatelets: Secondary | ICD-10-CM | POA: Diagnosis not present

## 2012-10-09 DIAGNOSIS — R0989 Other specified symptoms and signs involving the circulatory and respiratory systems: Secondary | ICD-10-CM

## 2012-10-09 DIAGNOSIS — I2510 Atherosclerotic heart disease of native coronary artery without angina pectoris: Secondary | ICD-10-CM | POA: Diagnosis present

## 2012-10-09 DIAGNOSIS — Z933 Colostomy status: Secondary | ICD-10-CM

## 2012-10-09 DIAGNOSIS — E1159 Type 2 diabetes mellitus with other circulatory complications: Secondary | ICD-10-CM | POA: Diagnosis present

## 2012-10-09 DIAGNOSIS — N2889 Other specified disorders of kidney and ureter: Principal | ICD-10-CM | POA: Diagnosis present

## 2012-10-09 DIAGNOSIS — K661 Hemoperitoneum: Secondary | ICD-10-CM | POA: Diagnosis not present

## 2012-10-09 DIAGNOSIS — D62 Acute posthemorrhagic anemia: Secondary | ICD-10-CM | POA: Diagnosis present

## 2012-10-09 DIAGNOSIS — Z794 Long term (current) use of insulin: Secondary | ICD-10-CM | POA: Diagnosis not present

## 2012-10-09 DIAGNOSIS — D509 Iron deficiency anemia, unspecified: Secondary | ICD-10-CM | POA: Diagnosis not present

## 2012-10-09 DIAGNOSIS — Z853 Personal history of malignant neoplasm of breast: Secondary | ICD-10-CM | POA: Diagnosis not present

## 2012-10-09 DIAGNOSIS — E785 Hyperlipidemia, unspecified: Secondary | ICD-10-CM | POA: Diagnosis present

## 2012-10-09 DIAGNOSIS — K5900 Constipation, unspecified: Secondary | ICD-10-CM

## 2012-10-09 DIAGNOSIS — I4891 Unspecified atrial fibrillation: Secondary | ICD-10-CM | POA: Diagnosis not present

## 2012-10-09 DIAGNOSIS — D649 Anemia, unspecified: Secondary | ICD-10-CM | POA: Diagnosis not present

## 2012-10-09 DIAGNOSIS — S37019A Minor contusion of unspecified kidney, initial encounter: Secondary | ICD-10-CM | POA: Diagnosis not present

## 2012-10-09 DIAGNOSIS — I129 Hypertensive chronic kidney disease with stage 1 through stage 4 chronic kidney disease, or unspecified chronic kidney disease: Secondary | ICD-10-CM | POA: Diagnosis present

## 2012-10-09 DIAGNOSIS — K2970 Gastritis, unspecified, without bleeding: Secondary | ICD-10-CM | POA: Diagnosis not present

## 2012-10-09 DIAGNOSIS — K683 Retroperitoneal hematoma: Secondary | ICD-10-CM | POA: Diagnosis present

## 2012-10-09 DIAGNOSIS — R141 Gas pain: Secondary | ICD-10-CM | POA: Diagnosis not present

## 2012-10-09 DIAGNOSIS — I739 Peripheral vascular disease, unspecified: Secondary | ICD-10-CM | POA: Diagnosis not present

## 2012-10-09 DIAGNOSIS — R110 Nausea: Secondary | ICD-10-CM | POA: Diagnosis not present

## 2012-10-09 DIAGNOSIS — E559 Vitamin D deficiency, unspecified: Secondary | ICD-10-CM

## 2012-10-09 LAB — COMPREHENSIVE METABOLIC PANEL
Albumin: 3.4 g/dL — ABNORMAL LOW (ref 3.5–5.2)
Alkaline Phosphatase: 52 U/L (ref 39–117)
BUN: 48 mg/dL — ABNORMAL HIGH (ref 6–23)
Creatinine, Ser: 1.62 mg/dL — ABNORMAL HIGH (ref 0.50–1.10)
GFR calc Af Amer: 35 mL/min — ABNORMAL LOW (ref >90)
Glucose, Bld: 250 mg/dL — ABNORMAL HIGH (ref 70–99)
Potassium: 6.2 mEq/L — ABNORMAL HIGH (ref 3.5–5.1)
Total Protein: 6.2 g/dL (ref 6.0–8.3)

## 2012-10-09 LAB — CBC WITH DIFFERENTIAL/PLATELET
Basophils Absolute: 0 10*3/uL (ref 0.0–0.1)
Basophils Relative: 0 % (ref 0–1)
Eosinophils Absolute: 0 10*3/uL (ref 0.0–0.7)
Eosinophils Relative: 0 % (ref 0–5)
HCT: 28.7 % — ABNORMAL LOW (ref 36.0–46.0)
Hemoglobin: 9.5 g/dL — ABNORMAL LOW (ref 12.0–15.0)
Lymphocytes Relative: 8 % — ABNORMAL LOW (ref 12–46)
Lymphs Abs: 1.3 10*3/uL (ref 0.7–4.0)
MCH: 31.8 pg (ref 26.0–34.0)
MCHC: 33.1 g/dL (ref 30.0–36.0)
MCV: 96 fL (ref 78.0–100.0)
Monocytes Absolute: 1 10*3/uL (ref 0.1–1.0)
Monocytes Relative: 6 % (ref 3–12)
Neutro Abs: 13.7 10*3/uL — ABNORMAL HIGH (ref 1.7–7.7)
Neutrophils Relative %: 85 % — ABNORMAL HIGH (ref 43–77)
Platelets: 300 10*3/uL (ref 150–400)
RBC: 2.99 MIL/uL — ABNORMAL LOW (ref 3.87–5.11)
RDW: 14.2 % (ref 11.5–15.5)
WBC: 16 10*3/uL — ABNORMAL HIGH (ref 4.0–10.5)

## 2012-10-09 LAB — URINE MICROSCOPIC-ADD ON

## 2012-10-09 LAB — GLUCOSE, CAPILLARY: Glucose-Capillary: 172 mg/dL — ABNORMAL HIGH (ref 70–99)

## 2012-10-09 LAB — URINALYSIS, ROUTINE W REFLEX MICROSCOPIC
Bilirubin Urine: NEGATIVE
Ketones, ur: NEGATIVE mg/dL
Nitrite: NEGATIVE
Specific Gravity, Urine: 1.02 (ref 1.005–1.030)
Urobilinogen, UA: 0.2 mg/dL (ref 0.0–1.0)
pH: 5 (ref 5.0–8.0)

## 2012-10-09 LAB — HEMOGLOBIN AND HEMATOCRIT, BLOOD
HCT: 26.2 % — ABNORMAL LOW (ref 36.0–46.0)
Hemoglobin: 8.7 g/dL — ABNORMAL LOW (ref 12.0–15.0)

## 2012-10-09 LAB — LIPASE, BLOOD: Lipase: 40 U/L (ref 11–59)

## 2012-10-09 MED ORDER — SODIUM CHLORIDE 0.9 % IV BOLUS (SEPSIS)
500.0000 mL | Freq: Once | INTRAVENOUS | Status: AC
  Administered 2012-10-09: 500 mL via INTRAVENOUS

## 2012-10-09 MED ORDER — ALUM & MAG HYDROXIDE-SIMETH 200-200-20 MG/5ML PO SUSP
30.0000 mL | Freq: Four times a day (QID) | ORAL | Status: DC | PRN
  Filled 2012-10-09: qty 30

## 2012-10-09 MED ORDER — SOTALOL HCL 80 MG PO TABS
40.0000 mg | ORAL_TABLET | Freq: Two times a day (BID) | ORAL | Status: DC
  Administered 2012-10-09 – 2012-10-10 (×2): 40 mg via ORAL
  Filled 2012-10-09 (×3): qty 0.5

## 2012-10-09 MED ORDER — SODIUM CHLORIDE 0.9 % IV SOLN
INTRAVENOUS | Status: DC
  Administered 2012-10-09: 18:00:00 via INTRAVENOUS

## 2012-10-09 MED ORDER — INSULIN ASPART 100 UNIT/ML ~~LOC~~ SOLN
0.0000 [IU] | Freq: Three times a day (TID) | SUBCUTANEOUS | Status: DC
  Administered 2012-10-10 (×3): 1 [IU] via SUBCUTANEOUS
  Administered 2012-10-11: 2 [IU] via SUBCUTANEOUS
  Administered 2012-10-11: 3 [IU] via SUBCUTANEOUS
  Administered 2012-10-12: 5 [IU] via SUBCUTANEOUS
  Administered 2012-10-12: 2 [IU] via SUBCUTANEOUS

## 2012-10-09 MED ORDER — EZETIMIBE 10 MG PO TABS
10.0000 mg | ORAL_TABLET | ORAL | Status: DC
  Administered 2012-10-11: 10 mg via ORAL
  Filled 2012-10-09 (×2): qty 1

## 2012-10-09 MED ORDER — SODIUM CHLORIDE 0.9 % IJ SOLN
3.0000 mL | Freq: Two times a day (BID) | INTRAMUSCULAR | Status: DC
  Administered 2012-10-11 – 2012-10-12 (×2): 3 mL via INTRAVENOUS

## 2012-10-09 MED ORDER — RANOLAZINE ER 500 MG PO TB12
500.0000 mg | ORAL_TABLET | Freq: Two times a day (BID) | ORAL | Status: DC
  Administered 2012-10-09 – 2012-10-12 (×6): 500 mg via ORAL
  Filled 2012-10-09 (×7): qty 1

## 2012-10-09 MED ORDER — NITROGLYCERIN 0.4 MG SL SUBL: 0.4000 mg | SUBLINGUAL_TABLET | SUBLINGUAL | Status: DC | PRN

## 2012-10-09 MED ORDER — ONDANSETRON HCL 4 MG/2ML IJ SOLN: 4.0000 mg | Freq: Four times a day (QID) | INTRAMUSCULAR | Status: DC | PRN

## 2012-10-09 MED ORDER — ONDANSETRON HCL 4 MG PO TABS: 4.0000 mg | ORAL_TABLET | Freq: Four times a day (QID) | ORAL | Status: DC | PRN

## 2012-10-09 MED ORDER — VITAMIN D 1000 UNITS PO CAPS
1000.0000 [IU] | ORAL_CAPSULE | Freq: Two times a day (BID) | ORAL | Status: DC
  Administered 2012-10-09 – 2012-10-12 (×6): 1000 [IU] via ORAL
  Filled 2012-10-09 (×7): qty 1

## 2012-10-09 MED ORDER — ACETAMINOPHEN 650 MG RE SUPP: 650.0000 mg | Freq: Four times a day (QID) | RECTAL | Status: DC | PRN

## 2012-10-09 MED ORDER — HYDROCODONE-ACETAMINOPHEN 5-325 MG PO TABS
1.0000 | ORAL_TABLET | ORAL | Status: DC | PRN
  Administered 2012-10-09: 1 via ORAL
  Filled 2012-10-09: qty 1

## 2012-10-09 MED ORDER — SODIUM CHLORIDE 0.9 % IV SOLN
INTRAVENOUS | Status: DC
  Administered 2012-10-09: 19:00:00 via INTRAVENOUS
  Administered 2012-10-09: 75 mL via INTRAVENOUS
  Administered 2012-10-11: 03:00:00 via INTRAVENOUS

## 2012-10-09 MED ORDER — ATORVASTATIN CALCIUM 20 MG PO TABS
20.0000 mg | ORAL_TABLET | Freq: Every day | ORAL | Status: DC
  Administered 2012-10-09 – 2012-10-11 (×3): 20 mg via ORAL
  Filled 2012-10-09 (×4): qty 1

## 2012-10-09 MED ORDER — VITAMIN B-12 1000 MCG PO TABS
1000.0000 ug | ORAL_TABLET | Freq: Every day | ORAL | Status: DC
  Administered 2012-10-09 – 2012-10-12 (×4): 1000 ug via ORAL
  Filled 2012-10-09 (×4): qty 1

## 2012-10-09 MED ORDER — ACETAMINOPHEN 325 MG PO TABS: 650.0000 mg | ORAL_TABLET | Freq: Four times a day (QID) | ORAL | Status: DC | PRN

## 2012-10-09 MED ORDER — INSULIN ASPART 100 UNIT/ML ~~LOC~~ SOLN: 0.0000 [IU] | Freq: Every day | SUBCUTANEOUS | Status: DC

## 2012-10-09 MED ORDER — HYDROMORPHONE HCL PF 1 MG/ML IJ SOLN: 1.0000 mg | INTRAMUSCULAR | Status: DC | PRN

## 2012-10-09 MED ORDER — DILTIAZEM HCL 90 MG PO TABS
90.0000 mg | ORAL_TABLET | Freq: Every day | ORAL | Status: DC
  Administered 2012-10-09: 90 mg via ORAL
  Filled 2012-10-09 (×2): qty 1

## 2012-10-09 NOTE — ED Notes (Signed)
Per EMS, pt colostomy for 2 years. 9 am pain in abdomen and more distended. Pt has become weak since and lightheaded and skin is pale. BP 94/55.  Pain is more with movement. 12 lead nothing acute. Pt didn't notice any blood in colostomy.

## 2012-10-09 NOTE — ED Provider Notes (Signed)
History     CSN: [id number]  Arrival date & time 10/09/12  1334   First MD Initiated Contact with Patient 10/09/12 1343      Chief Complaint  Patient presents with  . Abdominal Pain    (Consider location/radiation/quality/duration/timing/severity/associated sxs/prior treatment) HPI Comments: Patient presents here with the complaint of pain in the left lower quadrant and flank that started suddenly while reaching over to pick something up this morning.  She has a history of colostomy due to bowel perf several years ago but believes the ostomy is working okay.  She is passing stool that is non-bloody.  No fevers or chills.    Patient is a 78 y.o. female presenting with abdominal pain. The history is provided by the patient.  Abdominal Pain The primary symptoms of the illness include abdominal pain. The primary symptoms of the illness do not include fever, nausea or vomiting. Episode onset: this morning. The onset of the illness was sudden. The problem has not changed since onset. The patient has not had a change in bowel habit. Symptoms associated with the illness do not include chills or back pain.    Past Medical History  Diagnosis Date  . Anemia, iron deficiency   . Atrial fibrillation   . History of breast cancer     Followed yearly by [facility]  . Coronary artery disease   . Diabetes mellitus, type 2   . Hyperlipemia   . Hypertension   . Osteoporosis   . Peripheral vascular disease   . 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 Diverticulitis     Past Surgical History  Procedure Date  . Appendectomy 1962  . Dilation and curettage of uterus 1971     miscarrage, abnormal bleeding  . Dilation and curettage of uterus 1995    Menorrhagia  . Cardiac catheterization 2000    Iliac Stent (Dr. Guinevere Scarlet) yearly follow-up  . Breast surgery 02/01    Quadrectomy Breast cancer (Dr. Daphine Deutscher)  . Cardiac catheterization 01/25/02    High grade LAD disease, total of 3 vess,  involvement  . Coronary artery bypass graft 02/09/02    X 4 ([facility])  . Shoulder arthroscopy 05/04    Right, with rotator cuff debridement  . Hemorrhoid surgery 09/11/04    Binding Daphine Deutscher)  . Cardiac catheterization 01/13/07    SV graft to diag. occluded but good backfill o/w ok  . Cataract extraction 10/10 and 02/11  . Cardiac catheterization 02/14/10    PTCA & stent vein graft to OM/RCA ([facility])  . Colostomy   . Cardiac catheterization july 2012  . Partial colectomy     Family History  Problem Relation Age of Onset  . Hypertension Mother   . Heart disease Father   . Hypertension Father   . Heart disease Brother     CHF  . Hypertension Brother   . COPD Brother     bronchial, frequent pneumonia  . Hypertension Brother   . Hyperlipidemia Brother   . Heart disease Brother     CABG  . Hypertension Brother   . Gout Brother   . Obesity Brother     History  Substance Use Topics  . Smoking status: Never Smoker   . Smokeless tobacco: Never Used  . Alcohol Use: No    OB History    Grav Para Term Preterm Abortions TAB SAB Ect Mult Living  Review of Systems  Constitutional: Negative for fever and chills.  Gastrointestinal: Positive for abdominal pain. Negative for nausea and vomiting.  Musculoskeletal: Negative for back pain.  All other systems reviewed and are negative.    Allergies  Metformin and related  Home Medications   Current Outpatient Rx  Name  Route  Sig  Dispense  Refill  . ASPIRIN 81 MG PO TBEC   Oral   Take 81 mg by mouth daily.           . ATORVASTATIN CALCIUM 20 MG PO TABS   Oral   Take 20 mg by mouth at bedtime.           . BD INSULIN SYRINGE ULTRAFINE 31G X 5/16" 0.3 ML MISC      USE DAILY AND AS DIRECTED SLIDING SCALE   100 each   3   . BENAZEPRIL HCL 10 MG PO TABS   Oral   Take 10 mg by mouth daily.           Marland Kitchen VITAMIN D 1000 UNITS PO CAPS   Oral   Take 1,000 Units by mouth 2 (two) times daily.             Marland Kitchen CLOPIDOGREL BISULFATE 75 MG PO TABS   Oral   Take 1 tablet (75 mg total) by mouth daily.   90 tablet   3   . DILTIAZEM HCL 90 MG PO TABS   Oral   Take 90 mg by mouth daily.           Marland Kitchen EZETIMIBE 10 MG PO TABS   Oral   Take 10 mg by mouth every other day.          . GLYBURIDE 5 MG PO TABS   Oral   Take 2 tablets (10 mg total) by mouth daily with breakfast.   180 tablet   3   . INSULIN LISPRO (HUMAN) 100 UNIT/ML [place] SOLN      Use as directed per sliding scale: 200-250 = 4-5 Units, 251-300 = 5-6 Units, 301-350 = 6-7 Units.   30 mL   3   . MECLIZINE HCL 25 MG PO TABS   Oral   Take 25 mg by mouth daily as needed. For dizziness         . NITROGLYCERIN 0.4 MG SL SUBL   Sublingual   Place 0.4 mg under the tongue as needed. As directed         . FISH OIL 1000 MG PO CAPS   Oral   Take 1 capsule by mouth daily.          . ONETOUCH ULTRA BLUE VI STRP      USE AS DIRECTED TO CHECK BLOOD SUGAR 4 TIMES A DAY PER SLIDING SCALE   100 each   5   . PIOGLITAZONE HCL 45 MG PO TABS   Oral   Take 1 tablet (45 mg total) by mouth daily.   90 tablet   3   . RANEXA 500 MG PO TB12   Oral   Take 500 mg by mouth 2 (two) times daily.          Marland Kitchen SOTALOL HCL 80 MG PO TABS      Take 1/2  twice daily by mouth          . TRIAMTERENE-HCTZ 37.5-25 MG PO CAPS   Oral   Take 1 capsule by mouth daily.           Marland Kitchen  VITAMIN B-12 1000 MCG PO TABS   Oral   Take 1,000 mcg by mouth daily.            BP 111/55  Pulse 72  Temp 97.8 F (36.6 C)  Resp 18  SpO2 100%  Physical Exam  Nursing note and vitals reviewed. Constitutional: She is oriented to person, place, and time. She appears well-developed and well-nourished. No distress.  HENT:  Head: Normocephalic and atraumatic.  Mouth/Throat: Oropharynx is clear and moist.  Neck: Normal range of motion. Neck supple.  Cardiovascular: Normal rate and regular rhythm.   No murmur heard. Pulmonary/Chest: Effort  normal and breath sounds normal. No respiratory distress.  Abdominal: Soft. Bowel sounds are normal. She exhibits no distension.       There is ttp in the left mid abdomen and left flank.    Musculoskeletal: Normal range of motion. She exhibits no edema.  Neurological: She is alert and oriented to person, place, and time.  Skin: Skin is warm and dry. She is not diaphoretic.    ED Course  Procedures (including critical care time)  Labs Reviewed  CBC WITH DIFFERENTIAL - Abnormal; Notable for the following:    WBC 16.0 (*)     RBC 2.99 (*)     Hemoglobin 9.5 (*)     HCT 28.7 (*)     Neutrophils Relative 85 (*)     Neutro Abs 13.7 (*)     Lymphocytes Relative 8 (*)     All other components within normal limits  GLUCOSE, CAPILLARY - Abnormal; Notable for the following:    Glucose-Capillary 231 (*)     All other components within normal limits  COMPREHENSIVE METABOLIC PANEL  LIPASE, BLOOD  URINALYSIS, ROUTINE W REFLEX MICROSCOPIC   No results found.   No diagnosis found.   Date: 10/09/2012  Rate: 71  Rhythm: normal sinus rhythm  QRS Axis: normal  Intervals: normal  ST/T Wave abnormalities: nonspecific T wave changes  Conduction Disutrbances:none  Narrative Interpretation:   Old EKG Reviewed: unchanged    MDM  The patient presents with sudden onset of pain in the left side and left flank.  This was concerning for a kidney stone and I proceeded with a ct of the abdomen and pelvis without contrast.  This revealed a perinephric hematoma.  I have discussed this with Dr. Isabel Caprice who was on call for Urology.  He has reviewed the CT scan and would like to perform a scan with contrast to better evaluate the possible cause of the bleeding.  He also suggests admission to medicine for serial Hb and observation.  I have spoken with Dr. Isidoro Donning who agrees to admit the patient.          Geoffery Lyons, MD 10/09/12 1630

## 2012-10-09 NOTE — H&P (Signed)
History and Physical       Hospital Admission Note Date: 10/09/2012  Patient name: [name] Medical record number: [id number] Date of birth: 10-27-1934 Age: 79 y.o. Gender: female PCP: Crawford Givens, MD    Chief Complaint:  Abdominal pain  HPI: Patient is a 78 year old female with history of atrial fibrillation on aspirin Plavix, coronary disease, type 2 diabetes, hypertension, hyperlipidemia, peripheral vascular disease presented to ED with pain in the left lower quadrant and flank that started this morning suddenly while she was reaching over to pick up something. Patient has a history of bowel perforation and history of colostomy. She denied any fevers or chills, cloudy urine. She continued to have intermittent abdominal pain throughout the day. She did have nausea but no vomiting. ED workup showed Large hematoma involving the lower aspect of the left kidney. There is extensive blood and edema in the left retroperitoneum.  Review of Systems:  Constitutional: Denies fever, chills, diaphoresis, appetite change and fatigue.  HEENT: Denies photophobia, eye pain, redness, hearing loss, ear pain, congestion, sore throat, rhinorrhea, sneezing, mouth sores, trouble swallowing, neck pain, neck stiffness and tinnitus.   Respiratory: Denies SOB, DOE, cough, chest tightness,  and wheezing.   Cardiovascular: Denies chest pain, palpitations and leg swelling.  Gastrointestinal: Please see history of present illness  Genitourinary: Denies dysuria, urgency, frequency, hematuria, and difficulty urinating. +flank pain Musculoskeletal: Denies myalgias, back pain, joint swelling, arthralgias and gait problem.  Skin: Denies pallor, rash and wound.  Neurological: Denies dizziness, seizures, syncope, weakness, light-headedness, numbness and headaches.  Hematological: Denies adenopathy. Easy bruising, personal or family bleeding history    Psychiatric/Behavioral: Denies suicidal ideation, mood changes, confusion, nervousness, sleep disturbance and agitation  Past Medical History: Past Medical History  Diagnosis Date  . Anemia, iron deficiency   . Atrial fibrillation   . History of breast cancer     Followed yearly by [facility]  . Coronary artery disease   . Diabetes mellitus, type 2   . Hyperlipemia   . Hypertension   . Osteoporosis   . Peripheral vascular disease   . 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 Diverticulitis    Past Surgical History  Procedure Date  . Appendectomy 1962  . Dilation and curettage of uterus 1971     miscarrage, abnormal bleeding  . Dilation and curettage of uterus 1995    Menorrhagia  . Cardiac catheterization 2000    Iliac Stent (Dr. Guinevere Scarlet) yearly follow-up  . Breast surgery 02/01    Quadrectomy Breast cancer (Dr. Daphine Deutscher)  . Cardiac catheterization 01/25/02    High grade LAD disease, total of 3 vess, involvement  . Coronary artery bypass graft 02/09/02    X 4 ([place])  . Shoulder arthroscopy 05/04    Right, with rotator cuff debridement  . Hemorrhoid surgery 09/11/04    Binding Daphine Deutscher)  . Cardiac catheterization 01/13/07    SV graft to diag. occluded but good backfill o/w ok  . Cataract extraction 10/10 and 02/11  . Cardiac catheterization 02/14/10    PTCA & stent vein graft to OM/RCA ([facility])  . Colostomy   . Cardiac catheterization july 2012  . Partial colectomy     Medications: Prior to Admission medications   Medication Sig Start Date End Date Taking? Authorizing Provider  aspirin 81 MG EC tablet Take 81 mg by mouth daily.     Yes Historical Provider, MD  atorvastatin (LIPITOR) 20 MG tablet Take 20 mg by  mouth at bedtime.     Yes Historical Provider, MD  BD INSULIN SYRINGE ULTRAFINE 31G X 5/16" 0.3 ML MISC USE DAILY AND AS DIRECTED SLIDING SCALE 02/05/12  Yes Joaquim Nam, MD  benazepril (LOTENSIN) 10 MG tablet Take 10 mg by mouth daily.     Yes  Historical Provider, MD  Cholecalciferol (VITAMIN D) 1000 UNITS capsule Take 1,000 Units by mouth 2 (two) times daily.     Yes Historical Provider, MD  clopidogrel (PLAVIX) 75 MG tablet Take 1 tablet (75 mg total) by mouth daily. 03/07/12  Yes Joaquim Nam, MD  diltiazem (CARDIZEM) 90 MG tablet Take 90 mg by mouth daily.     Yes Historical Provider, MD  ezetimibe (ZETIA) 10 MG tablet Take 10 mg by mouth every other day.    Yes Historical Provider, MD  glyBURIDE (DIABETA) 5 MG tablet Take 2 tablets (10 mg total) by mouth daily with breakfast. 01/18/12 01/17/13 Yes Joaquim Nam, MD  insulin lispro (HUMALOG) 100 UNIT/ML injection Use as directed per sliding scale: 200-250 = 4-5 Units, 251-300 = 5-6 Units, 301-350 = 6-7 Units. 03/07/12  Yes Joaquim Nam, MD  meclizine (ANTIVERT) 25 MG tablet Take 25 mg by mouth daily as needed. For dizziness   Yes Historical Provider, MD  nitroGLYCERIN (NITROSTAT) 0.4 MG SL tablet Place 0.4 mg under the tongue as needed. As directed   Yes Historical Provider, MD  Omega-3 Fatty Acids (FISH OIL) 1000 MG CAPS Take 1 capsule by mouth daily.    Yes Historical Provider, MD  ONE TOUCH ULTRA TEST test strip USE AS DIRECTED TO CHECK BLOOD SUGAR 4 TIMES A DAY PER SLIDING SCALE 08/09/12  Yes Joaquim Nam, MD  pioglitazone (ACTOS) 45 MG tablet Take 1 tablet (45 mg total) by mouth daily. 03/07/12  Yes Joaquim Nam, MD  RANEXA 500 MG 12 hr tablet Take 500 mg by mouth 2 (two) times daily.  05/22/11  Yes Historical Provider, MD  sotalol (BETAPACE) 80 MG tablet Take 1/2  twice daily by mouth    Yes Historical Provider, MD  triamterene-hydrochlorothiazide (DYAZIDE) 37.5-25 MG per capsule Take 1 capsule by mouth daily.     Yes Historical Provider, MD  vitamin B-12 (CYANOCOBALAMIN) 1000 MCG tablet Take 1,000 mcg by mouth daily.    Yes Historical Provider, MD    Allergies:   Allergies  Allergen Reactions  . Metformin And Related     Held due to renal function    Social  History:  reports that she has never smoked. She has never used smokeless tobacco. She reports that she does not drink alcohol or use illicit drugs.  Family History: Family History  Problem Relation Age of Onset  . Hypertension Mother   . Heart disease Father   . Hypertension Father   . Heart disease Brother     CHF  . Hypertension Brother   . COPD Brother     bronchial, frequent pneumonia  . Hypertension Brother   . Hyperlipidemia Brother   . Heart disease Brother     CABG  . Hypertension Brother   . Gout Brother   . Obesity Brother     Physical Exam: Blood pressure 110/68, pulse 71, temperature 97.8 F (36.6 C), resp. rate 19, SpO2 100.00%. General: Alert, awake, oriented x3, in no acute distress. HEENT: normocephalic, atraumatic, anicteric sclera, pink conjunctiva, pupils equal and reactive to light and accomodation, oropharynx clear Neck: supple, no masses or lymphadenopathy, no goiter, no bruits  Heart: Regular rate and rhythm, without murmurs, rubs or gallops. Lungs: Clear to auscultation bilaterally, no wheezing, rales or rhonchi. Abdomen: Soft, tenderness in left mid abdomen, + colostomy, no bleeding  Extremities: No clubbing, cyanosis or edema with positive pedal pulses. Neuro: Grossly intact, no focal neurological deficits, strength 5/5 upper and lower extremities bilaterally Psych: alert and oriented x 3, normal mood and affect Skin: no rashes or lesions, warm and dry   LABS on Admission:  Basic Metabolic Panel:  Lab 10/09/12 1610  NA 133*  K 6.2*  CL 101  CO2 20  GLUCOSE 250*  BUN 48*  CREATININE 1.62*  CALCIUM 9.1  MG --  PHOS --   Liver Function Tests:  Lab 10/09/12 1410  AST 16  ALT 9  ALKPHOS 52  BILITOT 0.3  PROT 6.2  ALBUMIN 3.4*    Lab 10/09/12 1410  LIPASE 40  AMYLASE --   No results found for this basename: AMMONIA:2 in the last 168 hours CBC:  Lab 10/09/12 1410  WBC 16.0*  NEUTROABS 13.7*  HGB 9.5*  HCT 28.7*  MCV 96.0    PLT 300   Cardiac Enzymes: No results found for this basename: CKTOTAL:2,CKMB:2,CKMBINDEX:2,TROPONINI:2 in the last 168 hours BNP: No components found with this basename: POCBNP:2 CBG:  Lab 10/09/12 1447  GLUCAP 231*     Radiological Exams on Admission: Ct Abdomen Pelvis Wo Contrast  10/09/2012  *RADIOLOGY REPORT*  Clinical Data: Left abdominal pain.  Evaluate for kidney stone.  CT ABDOMEN AND PELVIS WITHOUT CONTRAST  Technique:  Multidetector CT imaging of the abdomen and pelvis was performed following the standard protocol without intravenous contrast.  Comparison: CT 11/27/2010  Findings: There is a 3 mm nodule in the right lower lobe which was present on 11/30/2003.  This likely represents a benign finding. There are stable pleural-based densities in the lower lobes, most likely associated with scarring.  There is no evidence for free intraperitoneal air.  There is a large amount of blood and stranding around the left kidney.  There is a hematoma along the inferior aspect of the left kidney that measures 14.0 x 7.1 cm.   An underlying left renal lesion is not identified but difficult to evaluate due to the blood products.  There is no evidence for a left renal stone.  No evidence for right kidney stones or hydronephrosis.  No gross abnormality to the liver.  There are small calcified gallstones and mild distention of the gallbladder.  No gross abnormality to the spleen, pancreas or adrenal glands.  There is a small hiatal hernia.  The patient has a very large parastomal hernia in the left lower abdomen.  The patient has a descending colostomy.  No gross abnormality to the urinary bladder.  The uterus is present.  There is fluid in the pelvis and there may some blood products in this area.  Small bowel loops within the parastomal hernia.  There is no evidence to suggest a small bowel obstruction.  The aorta and branch vessels are heavily calcified.  No evidence for aneurysmal dilatation.  Diffuse  osteopenia of the bones.  IMPRESSION: Large hematoma involving the lower aspect of the left kidney. There is extensive blood and edema in the left retroperitoneum. The etiology of the bleeding is unknown.  There is not an obvious renal lesion but difficult to evaluate the left kidney without contrast.  Large parastomal hernia in the left lower quadrant.  Cholelithiasis.  These results were called by telephone on 10/09/2012  at 2:50 p.m. to Dr. Judd Lien, who verbally acknowledged these results.   Original Report Authenticated By: Richarda Overlie, M.D.     Assessment/Plan Principal Problem:  *Acute abdominal pain with Retroperitoneal/perinephric hematoma - Admit under observation, serial Hb/Hct, urology (Dr Isabel Caprice) has been consulted by EDP and per urology recommendations, repeat CT abd and pelvis under contrast to r/o remal mass/tumor - UA shows no UTI - Hold aspirin and Plavix, she's not on any blood thinners. Cont PT/INR  Active Problems:  Type 2 diabetes mellitus with vascular disease - place on SSI, obtain HbA1C   HYPERLIPIDEMIA - cont statin   ANEMIA-IRON DEFICIENCY   HYPERTENSION: cont home medications   CORONARY ARTERY DISEASE   Atrial fibrillation: - rate controlled, no ASA and plavix   Chronic kidney disease, stage III (moderate) - Hold ACEI, cont gentle hydration  DVT prophylaxis: SCD's  CODE STATUS: FC  Further plan will depend as patient's clinical course evolves and further radiologic and laboratory data become available.   Time Spent on Admission: 1 hour  Amyra Vantuyl M.D. [facility] 10/09/2012, 5:36 PM Pager: [phone]  If 7PM-7AM, please contact night-coverage [email or web address] Password [email or web address]

## 2012-10-09 NOTE — ED Notes (Signed)
Pt knows that urine is needed. Pt is unable to void at this time.  

## 2012-10-09 NOTE — Progress Notes (Addendum)
GU  I received check out by Dr. Isabel Caprice to follow up CT with IV contrast. This CT was recommended by him, but it was cancelled due to her GFR being so poor. I did discuss this with the on call radiologist, who recommended avoiding a contrasted CT scan at this time as long as she remained stable. If there is concern about further bleeding, she may need an angiogram. At this time, I agree that the addition of a CT scan tonight would be unlikely to benefit her much. Her vitals remain stable and she will have serial monitoring of her hemoglobin. Initial repeat hemoglobin is 8.7 from 9.5 earlier today. This is reasonable given the patient is being hydrated and she has had some blood loss. Her vitals are currently stable. I have ordered for her to be on bedrest, serial hemoglobins every 6 hours, vital signs every 4 hours, NPO except for medications, and to place SCDs.  Dr. Isabel Caprice plans to see her tomorrow for an official consult. If there are changes in her condition tonight, I will intervene.   Filed Vitals:   10/09/12 1909  BP: 126/76  Pulse: 80  Temp: 98.3 F (36.8 C)  Resp: 20

## 2012-10-10 DIAGNOSIS — K5900 Constipation, unspecified: Secondary | ICD-10-CM

## 2012-10-10 DIAGNOSIS — R52 Pain, unspecified: Secondary | ICD-10-CM | POA: Diagnosis not present

## 2012-10-10 DIAGNOSIS — N2889 Other specified disorders of kidney and ureter: Secondary | ICD-10-CM | POA: Diagnosis not present

## 2012-10-10 DIAGNOSIS — R109 Unspecified abdominal pain: Secondary | ICD-10-CM | POA: Diagnosis not present

## 2012-10-10 DIAGNOSIS — I4891 Unspecified atrial fibrillation: Secondary | ICD-10-CM | POA: Diagnosis not present

## 2012-10-10 LAB — BASIC METABOLIC PANEL
Calcium: 8.9 mg/dL (ref 8.4–10.5)
Creatinine, Ser: 1.65 mg/dL — ABNORMAL HIGH (ref 0.50–1.10)
GFR calc Af Amer: 34 mL/min — ABNORMAL LOW (ref >90)
GFR calc non Af Amer: 29 mL/min — ABNORMAL LOW (ref >90)
Sodium: 137 mEq/L (ref 135–145)

## 2012-10-10 LAB — CBC
MCH: 31.5 pg (ref 26.0–34.0)
MCHC: 33.7 g/dL (ref 30.0–36.0)
MCV: 93.4 fL (ref 78.0–100.0)
Platelets: 222 10*3/uL (ref 150–400)
RBC: 2.73 MIL/uL — ABNORMAL LOW (ref 3.87–5.11)
RDW: 15.9 % — ABNORMAL HIGH (ref 11.5–15.5)

## 2012-10-10 LAB — PREPARE RBC (CROSSMATCH)

## 2012-10-10 LAB — HEMOGLOBIN AND HEMATOCRIT, BLOOD
HCT: 22.5 % — ABNORMAL LOW (ref 36.0–46.0)
Hemoglobin: 7.5 g/dL — ABNORMAL LOW (ref 12.0–15.0)
Hemoglobin: 9.5 g/dL — ABNORMAL LOW (ref 12.0–15.0)

## 2012-10-10 LAB — HEMOGLOBIN A1C
Hgb A1c MFr Bld: 7.7 % — ABNORMAL HIGH (ref <5.7)
Mean Plasma Glucose: 183 mg/dL — ABNORMAL HIGH (ref <117)

## 2012-10-10 LAB — GLUCOSE, CAPILLARY
Glucose-Capillary: 121 mg/dL — ABNORMAL HIGH (ref 70–99)
Glucose-Capillary: 130 mg/dL — ABNORMAL HIGH (ref 70–99)

## 2012-10-10 MED ORDER — FUROSEMIDE 10 MG/ML IJ SOLN
20.0000 mg | Freq: Once | INTRAMUSCULAR | Status: AC
  Administered 2012-10-10: 20 mg via INTRAVENOUS
  Filled 2012-10-10: qty 2

## 2012-10-10 MED ORDER — MAGNESIUM HYDROXIDE 400 MG/5ML PO SUSP: 30.0000 mL | Freq: Every day | ORAL | Status: DC | PRN

## 2012-10-10 MED ORDER — DILTIAZEM HCL 60 MG PO TABS
60.0000 mg | ORAL_TABLET | Freq: Every day | ORAL | Status: DC
  Administered 2012-10-11 – 2012-10-12 (×2): 60 mg via ORAL
  Filled 2012-10-10 (×2): qty 1

## 2012-10-10 MED ORDER — SOTALOL HCL 80 MG PO TABS
40.0000 mg | ORAL_TABLET | Freq: Every day | ORAL | Status: DC
  Administered 2012-10-11 – 2012-10-12 (×2): 40 mg via ORAL
  Filled 2012-10-10 (×2): qty 0.5

## 2012-10-10 MED ORDER — ACETAMINOPHEN 325 MG PO TABS
650.0000 mg | ORAL_TABLET | Freq: Once | ORAL | Status: AC
  Administered 2012-10-10: 650 mg via ORAL
  Filled 2012-10-10: qty 2

## 2012-10-10 MED ORDER — POLYETHYLENE GLYCOL 3350 17 G PO PACK
17.0000 g | PACK | Freq: Every day | ORAL | Status: DC
  Administered 2012-10-10 – 2012-10-11 (×2): 17 g via ORAL
  Filled 2012-10-10 (×2): qty 1

## 2012-10-10 MED ORDER — SENNOSIDES-DOCUSATE SODIUM 8.6-50 MG PO TABS
1.0000 | ORAL_TABLET | Freq: Two times a day (BID) | ORAL | Status: DC
  Administered 2012-10-10 – 2012-10-12 (×5): 1 via ORAL
  Filled 2012-10-10 (×6): qty 1

## 2012-10-10 NOTE — Consult Note (Signed)
Urology Consult  Referring physician: Ripudeep Rai Reason for referral: Spontaneous left perinephric/retroperitoneal hematoma  History of Present Illness: [name] is a 78 year old female with a number of medical core morbidities he developed the sudden onset of left flank pain while bending over. The emergency room physician suspected an acute episode of nephrolithiasis and performed a stone protocol CT. A large left perinephric hematoma with bleeding and inflammation in the retroperitoneum was appreciated. The patient was has remained hemodynamically stable. I was contacted by the ER physicians to provide some recommendations. I reviewed the imaging studies and then discuss things with radiology. We've plans on obtaining a CT of the abdomen and pelvis with a limited IV contrast due to renal insufficiency but that imaging study was canceled. Apparently he was canceled by radiology due to her renal insufficiency. The patient does have a baseline creatinine of approximately 1.6 which is been stable. She does have a history of April fibrillation and has been on aspirin and Plavix. She also has a history of coronary disease, type 2 diabetes mellitus, hypertension and hyperlipidemia. Of note she did have a CT of her abdomen and pelvis approximately 2 years ago with IV contrast which did not show any evidence of a renal mass or other significant urologic pathology at that time. The patient does have a chronic anemia and a baseline hemoglobin of just over 10.  Past Medical History  Diagnosis Date  . Anemia, iron deficiency   . Atrial fibrillation   . History of breast cancer     Followed yearly by [facility]  . Coronary artery disease   . Diabetes mellitus, type 2   . Hyperlipemia   . Hypertension   . Osteoporosis   . Peripheral vascular disease   . 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 Diverticulitis    Past Surgical History  Procedure Date  . Appendectomy 1962  . Dilation and curettage  of uterus 1971     miscarrage, abnormal bleeding  . Dilation and curettage of uterus 1995    Menorrhagia  . Cardiac catheterization 2000    Iliac Stent (Dr. Guinevere Scarlet) yearly follow-up  . Breast surgery 02/01    Quadrectomy Breast cancer (Dr. Daphine Deutscher)  . Cardiac catheterization 01/25/02    High grade LAD disease, total of 3 vess, involvement  . Coronary artery bypass graft 02/09/02    X 4 ([place])  . Shoulder arthroscopy 05/04    Right, with rotator cuff debridement  . Hemorrhoid surgery 09/11/04    Binding Daphine Deutscher)  . Cardiac catheterization 01/13/07    SV graft to diag. occluded but good backfill o/w ok  . Cataract extraction 10/10 and 02/11  . Cardiac catheterization 02/14/10    PTCA & stent vein graft to OM/RCA ([place])  . Colostomy   . Cardiac catheterization july 2012  . Partial colectomy     Medications:  Scheduled:   . atorvastatin  20 mg Oral QHS  . diltiazem  60 mg Oral Daily  . ezetimibe  10 mg Oral Q48H  . insulin aspart  0-5 Units Subcutaneous QHS  . insulin aspart  0-9 Units Subcutaneous TID WC  . polyethylene glycol  17 g Oral Daily  . ranolazine  500 mg Oral BID  . senna-docusate  1 tablet Oral BID  . sodium chloride  3 mL Intravenous Q12H  . sotalol  40 mg Oral Daily  . vitamin B-12  1,000 mcg Oral Daily  . Vitamin D  1,000 Units  Oral BID    Allergies:  Allergies  Allergen Reactions  . Metformin And Related     Held due to renal function    Family History  Problem Relation Age of Onset  . Hypertension Mother   . Heart disease Father   . Hypertension Father   . Heart disease Brother     CHF  . Hypertension Brother   . COPD Brother     bronchial, frequent pneumonia  . Hypertension Brother   . Hyperlipidemia Brother   . Heart disease Brother     CABG  . Hypertension Brother   . Gout Brother   . Obesity Brother     Social History:  reports that she has never smoked. She has never used smokeless tobacco. She reports that she does not  drink alcohol or use illicit drugs.  Positive for flank and abdominal discomfort, some lightheadedness. She currently denies any gross hematuria, dysuria, change in voiding patterns, shortness of breath, chest pain or other complaints.  Physical Exam:  Vital signs in last 24 hours: Temp:  [97.8 F (36.6 C)-99 F (37.2 C)] 98.2 F (36.8 C) (01/06 0715) Pulse Rate:  [63-80] 67  (01/06 0443) Resp:  [16-26] 16  (01/06 0715) BP: (90-126)/(28-76) 111/48 mmHg (01/06 0715) SpO2:  [94 %-100 %] 96 % (01/06 0715) Weight:  [76.25 kg (168 lb 1.6 oz)] 76.25 kg (168 lb 1.6 oz) (01/05 2044)  Constitutional: Vital signs reviewed. WD WN in NAD Head: Normocephalic and atraumatic   Eyes: PERRL, No scleral icterus.  Neck: Supple No  Gross JVD, mass, thyromegaly, or carotid bruit present.  Cardiovascular: RRR Pulmonary/Chest: Normal effort Abdominal: Soft. Some tenderness in the left upper quadrant and left flank. Mild distention. Genitourinary:not examined Extremities: No cyanosis or edema  Neurological: Grossly non-focal.  Skin: Warm,very dry and intact. No rash, cyanosis   Laboratory Data:  Results for orders placed during the hospital encounter of 10/09/12 (from the past 72 hour(s))  CBC WITH DIFFERENTIAL     Status: Abnormal   Collection Time   10/09/12  2:10 PM      Component Value Range Comment   WBC 16.0 (*) 4.0 - 10.5 K/uL    RBC 2.99 (*) 3.87 - 5.11 MIL/uL    Hemoglobin 9.5 (*) 12.0 - 15.0 g/dL    HCT 16.1 (*) 09.6 - 46.0 %    MCV 96.0  78.0 - 100.0 fL    MCH 31.8  26.0 - 34.0 pg    MCHC 33.1  30.0 - 36.0 g/dL    RDW 04.5  40.9 - 81.1 %    Platelets 300  150 - 400 K/uL    Neutrophils Relative 85 (*) 43 - 77 %    Neutro Abs 13.7 (*) 1.7 - 7.7 K/uL    Lymphocytes Relative 8 (*) 12 - 46 %    Lymphs Abs 1.3  0.7 - 4.0 K/uL    Monocytes Relative 6  3 - 12 %    Monocytes Absolute 1.0  0.1 - 1.0 K/uL    Eosinophils Relative 0  0 - 5 %    Eosinophils Absolute 0.0  0.0 - 0.7 K/uL     Basophils Relative 0  0 - 1 %    Basophils Absolute 0.0  0.0 - 0.1 K/uL   COMPREHENSIVE METABOLIC PANEL     Status: Abnormal   Collection Time   10/09/12  2:10 PM      Component Value Range Comment   Sodium 133 (*)  135 - 145 mEq/L    Potassium 6.2 (*) 3.5 - 5.1 mEq/L    Chloride 101  96 - 112 mEq/L    CO2 20  19 - 32 mEq/L    Glucose, Bld 250 (*) 70 - 99 mg/dL    BUN 48 (*) 6 - 23 mg/dL    Creatinine, Ser 1.61 (*) 0.50 - 1.10 mg/dL    Calcium 9.1  8.4 - 09.6 mg/dL    Total Protein 6.2  6.0 - 8.3 g/dL    Albumin 3.4 (*) 3.5 - 5.2 g/dL    AST 16  0 - 37 U/L    ALT 9  0 - 35 U/L    Alkaline Phosphatase 52  39 - 117 U/L    Total Bilirubin 0.3  0.3 - 1.2 mg/dL    GFR calc non Af Amer 30 (*) >90 mL/min    GFR calc Af Amer 35 (*) >90 mL/min   LIPASE, BLOOD     Status: Normal   Collection Time   10/09/12  2:10 PM      Component Value Range Comment   Lipase 40  11 - 59 U/L   GLUCOSE, CAPILLARY     Status: Abnormal   Collection Time   10/09/12  2:47 PM      Component Value Range Comment   Glucose-Capillary 231 (*) 70 - 99 mg/dL    Comment 1 Documented in Chart      Comment 2 Notify RN     URINALYSIS, ROUTINE W REFLEX MICROSCOPIC     Status: Abnormal   Collection Time   10/09/12  5:06 PM      Component Value Range Comment   Color, Urine YELLOW  YELLOW    APPearance CLOUDY (*) CLEAR    Specific Gravity, Urine 1.020  1.005 - 1.030    pH 5.0  5.0 - 8.0    Glucose, UA NEGATIVE  NEGATIVE mg/dL    Hgb urine dipstick NEGATIVE  NEGATIVE    Bilirubin Urine NEGATIVE  NEGATIVE    Ketones, ur NEGATIVE  NEGATIVE mg/dL    Protein, ur 30 (*) NEGATIVE mg/dL    Urobilinogen, UA 0.2  0.0 - 1.0 mg/dL    Nitrite NEGATIVE  NEGATIVE    Leukocytes, UA SMALL (*) NEGATIVE   URINE MICROSCOPIC-ADD ON     Status: Abnormal   Collection Time   10/09/12  5:06 PM      Component Value Range Comment   Squamous Epithelial / LPF RARE  RARE    WBC, UA 0-2  <3 WBC/hpf    RBC / HPF 7-10  <3 RBC/hpf    Bacteria, UA RARE   RARE    Casts GRANULAR CAST (*) NEGATIVE   HEMOGLOBIN A1C     Status: Abnormal   Collection Time   10/09/12  7:16 PM      Component Value Range Comment   Hemoglobin A1C 8.0 (*) <5.7 %    Mean Plasma Glucose 183 (*) <117 mg/dL   HEMOGLOBIN AND HEMATOCRIT, BLOOD     Status: Abnormal   Collection Time   10/09/12  7:16 PM      Component Value Range Comment   Hemoglobin 8.7 (*) 12.0 - 15.0 g/dL    HCT 04.5 (*) 40.9 - 46.0 %   PROTIME-INR     Status: Normal   Collection Time   10/09/12  7:16 PM      Component Value Range Comment   Prothrombin Time 13.1  11.6 -  15.2 seconds    INR 1.00  0.00 - 1.49   GLUCOSE, CAPILLARY     Status: Abnormal   Collection Time   10/09/12  7:17 PM      Component Value Range Comment   Glucose-Capillary 148 (*) 70 - 99 mg/dL   GLUCOSE, CAPILLARY     Status: Abnormal   Collection Time   10/09/12 11:42 PM      Component Value Range Comment   Glucose-Capillary 172 (*) 70 - 99 mg/dL   HEMOGLOBIN AND HEMATOCRIT, BLOOD     Status: Abnormal   Collection Time   10/10/12 12:15 AM      Component Value Range Comment   Hemoglobin 7.5 (*) 12.0 - 15.0 g/dL    HCT 82.9 (*) 56.2 - 46.0 %   PREPARE RBC (CROSSMATCH)     Status: Normal   Collection Time   10/10/12  2:00 AM      Component Value Range Comment   Order Confirmation ORDER PROCESSED BY BLOOD BANK     TYPE AND SCREEN     Status: Normal (Preliminary result)   Collection Time   10/10/12  2:32 AM      Component Value Range Comment   ABO/RH(D) A POS      Antibody Screen NEG      Sample Expiration 10/13/2012      Unit Number [id number]      Blood Component Type RED CELLS,LR      Unit division 00      Status of Unit ALLOCATED      Transfusion Status OK TO TRANSFUSE      Crossmatch Result Compatible      Unit Number [id number]      Blood Component Type RED CELLS,LR      Unit division 00      Status of Unit ISSUED      Transfusion Status OK TO TRANSFUSE      Crossmatch Result Compatible     GLUCOSE, CAPILLARY      Status: Abnormal   Collection Time   10/10/12  7:55 AM      Component Value Range Comment   Glucose-Capillary 125 (*) 70 - 99 mg/dL    No results found for this or any previous visit (from the past 240 hour(s)). Creatinine:  Basename 10/09/12 1410  CREATININE 1.62*   Baseline Creatinine: 1.6  Impression/Assessment:  Spontaneous left perinephric bleed with perinephric and retroperitoneal hematoma. The etiology is unclear. These most likely occur with underlying renal mass especially angiomyolipomas. The patient did however have a CT of her abdomen with contrast approximately 2 years ago and no renal mass was noted at that time. This makes a renal neoplasm less likely to be involved in this patient's bleed. I suspect she may have simply had a spontaneous bleed. It is important that she undergo a vaginal imaging hopefully with IV contrast or potentially MRI to rule out an underlying pathology within the kidney itself. This can be deferred for now and may actually be more prudent 1 some of the hematoma has resolved. Most important at this time is to be sure that her hemoglobin remained stable. She has had an appropriate response to 2 units of packed blood cells and does appear to be hemodynamically stable. Hopefully this indicates she has no further bleeding at this time. Her activity can be increased. I do think she should probably be monitored in an additional 24-48 hours. If her hemoglobin remains stable then she can be discharged. I  would think it prudent to hold her anticoagulation for several weeks unless it is felt that this would be a substantial cardiac risk for her. I will be unavailable tomorrow due to jury duty service. Please contact [facility] to speak to the physician on call if there are new concerns or questions.  Plan:  As above.  Rithik Odea S 10/10/2012, 8:54 AM

## 2012-10-10 NOTE — Progress Notes (Signed)
Patient unable to void bladder scanned for of urine.Dr. Margarita Grizzle  notified .Will continue to monitor.

## 2012-10-10 NOTE — Progress Notes (Signed)
Patient's hemoglobin returned 7.5 from 8.7. I discussed with the patient her options, which include continued observation or transfusion. We discussed the risks, benefits, and alternatives of transfusion which include, but are not limited to, infection (chronic & acute), allergic reaction, TRALI, need for further testing/transfusion, and mortality. She voiced understanding.  She has the same discomfort in her back/abdomen which she had on presentation which is controlled with pain medications. She is otherwise asymptomatic without lightheadedness or dizziness. I ordered transfusion with 2 units of RBCs with lasix 20mg  in between and a post-transfusion H&H.

## 2012-10-10 NOTE — Progress Notes (Signed)
[facility] PROGRESS NOTE  [name] XBJ:[id number] DOB: 1935-03-20 DOA: 10/09/2012 PCP: Crawford Givens, MD  Assessment/Plan  Retroperitoneal/perinephric hematoma - No CT with contrast at this time unless urgent in order to protect renal function.   - hgb trended up by 1 point despite 2 unit blood transfusion - Will continue to cycle hgb/hct and if continuing to drop, may need contrasted study for directed intervention to stop bleeding.   - Hold aspirin and Plavix -  Transfuse to keep hgb > 7 or for signs of hemodynamic instability -  Appreciate urology recommendations  anemia of acute blood loss with presumptive iron deficiency anemia:   -  Will need iron supplementation when tolerating diet  Type 2 diabetes mellitus, fingersticks 121-231, trending down.  A1c 7.7 - Continue SSI  HYPERLIPIDEMIA, stable.  cont statin  HYPERTENSION with borderline hypotension in the setting of acute bleeding -  Decrease dilt to 60mg  from 90 mg -  On a small dose of sotalol  CORONARY ARTERY DISEASE  Atrial fibrillation:  rate controlled, no ASA and plavix  -  Decreasing dilt slighlty due to borderline hypoTN in the setting of bleeding so continue telemetry  Chronic kidney disease, stage III (moderate) creatinine at baseline of 1.65.   - Hold ACEI, cont gentle hydration   Constipation: -  Start miralax and senna-docusate -  Milk of magnesia prn  Diet:  CLD Access:  PIV x 2 IVF:  NS at 75ml Proph:  SCDs  Code Status: full code Family Communication: spoke with patient alone Disposition Plan: pending hemodynamic stability and hemoglobin stable.   Consultants:  Urology  Procedures:  CT abd/pelvis without contrast  Antibiotics:  noen   HPI/Subjective: Patient states that she has stable pain in the left abdomen that radiates under her left rib cage, but denies flank pain.  Pain is only present with palpation and movement but not with rest.  Able to drink some liquids this  morning without nausea or vomiting.  Constipated.  Objective: Filed Vitals:   10/10/12 0915 10/10/12 1040 10/10/12 1100 10/10/12 1140  BP: 116/51  116/52 99/42  Pulse: 67 66 61 60  Temp: 97.9 F (36.6 C) 97.8 F (36.6 C) 98.1 F (36.7 C) 98.4 F (36.9 C)  TempSrc: Oral Oral Oral Oral  Resp: 18 18 18 18   Height:      Weight:      SpO2:  95%      Intake/Output Summary (Last 24 hours) at 10/10/12 1237 Last data filed at 10/10/12 1200  Gross per 24 hour  Intake 1322.5 ml  Output   1850 ml  Net -527.5 ml   Filed Weights   10/09/12 2044  Weight: 76.25 kg (168 lb 1.6 oz)    Exam:   General:  No acute distress  HEENT:  PERRL, MMM  Cardiovascular:  RRR, no murmurs, rubs or gallops  Respiratory:  CTAB  Abdomen:  NABS, soft, TTP in the left periumbilical area and towards the mid-axillary line slightly superiorly  MSK:  Normal tone and bulk  Neuro:  Grossly intact  Data Reviewed: Basic Metabolic Panel:  Lab 10/10/12 3086 10/09/12 1410  NA 137 133*  K 5.0 6.2*  CL 104 101  CO2 24 20  GLUCOSE 144* 250*  BUN 48* 48*  CREATININE 1.65* 1.62*  CALCIUM 8.9 9.1  MG -- --  PHOS -- --   Liver Function Tests:  Lab 10/09/12 1410  AST 16  ALT 9  ALKPHOS 52  BILITOT 0.3  PROT 6.2  ALBUMIN 3.4*    Lab 10/09/12 1410  LIPASE 40  AMYLASE --   No results found for this basename: AMMONIA:5 in the last 168 hours CBC:  Lab 10/10/12 1100 10/10/12 0015 10/09/12 1916 10/09/12 1410  WBC 10.8* -- -- 16.0*  NEUTROABS -- -- -- 13.7*  HGB 8.6* 7.5* 8.7* 9.5*  HCT 25.5* 22.5* 26.2* 28.7*  MCV 93.4 -- -- 96.0  PLT 222 -- -- 300   Cardiac Enzymes: No results found for this basename: CKTOTAL:5,CKMB:5,CKMBINDEX:5,TROPONINI:5 in the last 168 hours BNP (last 3 results) No results found for this basename: PROBNP:3 in the last 8760 hours CBG:  Lab 10/10/12 1151 10/10/12 0755 10/09/12 2342 10/09/12 1917 10/09/12 1447  GLUCAP 121* 125* 172* 148* 231*    No results found  for this or any previous visit (from the past 240 hour(s)).   Studies: Ct Abdomen Pelvis Wo Contrast  10/09/2012  *RADIOLOGY REPORT*  Clinical Data: Left abdominal pain.  Evaluate for kidney stone.  CT ABDOMEN AND PELVIS WITHOUT CONTRAST  Technique:  Multidetector CT imaging of the abdomen and pelvis was performed following the standard protocol without intravenous contrast.  Comparison: CT 11/27/2010  Findings: There is a 3 mm nodule in the right lower lobe which was present on 11/30/2003.  This likely represents a benign finding. There are stable pleural-based densities in the lower lobes, most likely associated with scarring.  There is no evidence for free intraperitoneal air.  There is a large amount of blood and stranding around the left kidney.  There is a hematoma along the inferior aspect of the left kidney that measures 14.0 x 7.1 cm.   An underlying left renal lesion is not identified but difficult to evaluate due to the blood products.  There is no evidence for a left renal stone.  No evidence for right kidney stones or hydronephrosis.  No gross abnormality to the liver.  There are small calcified gallstones and mild distention of the gallbladder.  No gross abnormality to the spleen, pancreas or adrenal glands.  There is a small hiatal hernia.  The patient has a very large parastomal hernia in the left lower abdomen.  The patient has a descending colostomy.  No gross abnormality to the urinary bladder.  The uterus is present.  There is fluid in the pelvis and there may some blood products in this area.  Small bowel loops within the parastomal hernia.  There is no evidence to suggest a small bowel obstruction.  The aorta and branch vessels are heavily calcified.  No evidence for aneurysmal dilatation.  Diffuse osteopenia of the bones.  IMPRESSION: Large hematoma involving the lower aspect of the left kidney. There is extensive blood and edema in the left retroperitoneum. The etiology of the bleeding is  unknown.  There is not an obvious renal lesion but difficult to evaluate the left kidney without contrast.  Large parastomal hernia in the left lower quadrant.  Cholelithiasis.  These results were called by telephone on 10/09/2012 at 2:50 p.m. to Dr. Judd Lien, who verbally acknowledged these results.   Original Report Authenticated By: Richarda Overlie, M.D.     Scheduled Meds:   . atorvastatin  20 mg Oral QHS  . diltiazem  90 mg Oral Daily  . ezetimibe  10 mg Oral Q48H  . insulin aspart  0-5 Units Subcutaneous QHS  . insulin aspart  0-9 Units Subcutaneous TID WC  . ranolazine  500 mg Oral BID  . sodium chloride  3  mL Intravenous Q12H  . sotalol  40 mg Oral BID  . vitamin B-12  1,000 mcg Oral Daily  . Vitamin D  1,000 Units Oral BID   Continuous Infusions:   . sodium chloride 75 mL (10/09/12 2300)    Principal Problem:  *Retroperitoneal hematoma Active Problems:  Type 2 diabetes mellitus with vascular disease  HYPERLIPIDEMIA  ANEMIA-IRON DEFICIENCY  HYPERTENSION  CORONARY ARTERY DISEASE  Atrial fibrillation  Chronic kidney disease, stage III (moderate)  Abdominal pain, acute    Time spent: 30 min    [name]  [facility] Pager [phone]. If 8PM-8AM, please contact night-coverage at [email or web address], password [facility] 10/10/2012, 12:37 PM  LOS: 1 day

## 2012-10-11 DIAGNOSIS — K5900 Constipation, unspecified: Secondary | ICD-10-CM

## 2012-10-11 DIAGNOSIS — N2889 Other specified disorders of kidney and ureter: Secondary | ICD-10-CM | POA: Diagnosis not present

## 2012-10-11 DIAGNOSIS — D509 Iron deficiency anemia, unspecified: Secondary | ICD-10-CM | POA: Diagnosis not present

## 2012-10-11 LAB — TYPE AND SCREEN: Antibody Screen: NEGATIVE

## 2012-10-11 LAB — CBC
Hemoglobin: 9.9 g/dL — ABNORMAL LOW (ref 12.0–15.0)
MCH: 31.2 pg (ref 26.0–34.0)
MCHC: 33.9 g/dL (ref 30.0–36.0)
RDW: 16.2 % — ABNORMAL HIGH (ref 11.5–15.5)

## 2012-10-11 LAB — BASIC METABOLIC PANEL
BUN: 29 mg/dL — ABNORMAL HIGH (ref 6–23)
Creatinine, Ser: 1.28 mg/dL — ABNORMAL HIGH (ref 0.50–1.10)
GFR calc Af Amer: 46 mL/min — ABNORMAL LOW (ref >90)
GFR calc non Af Amer: 40 mL/min — ABNORMAL LOW (ref >90)
Glucose, Bld: 109 mg/dL — ABNORMAL HIGH (ref 70–99)
Potassium: 4.4 mEq/L (ref 3.5–5.1)

## 2012-10-11 LAB — URINE CULTURE: Culture: NO GROWTH

## 2012-10-11 LAB — GLUCOSE, CAPILLARY
Glucose-Capillary: 107 mg/dL — ABNORMAL HIGH (ref 70–99)
Glucose-Capillary: 211 mg/dL — ABNORMAL HIGH (ref 70–99)

## 2012-10-11 MED ORDER — POLYETHYLENE GLYCOL 3350 17 G PO PACK
34.0000 g | PACK | Freq: Every day | ORAL | Status: DC
  Administered 2012-10-11 – 2012-10-12 (×2): 34 g via ORAL
  Filled 2012-10-11 (×2): qty 2

## 2012-10-11 NOTE — Progress Notes (Signed)
[facility] PROGRESS NOTE  SONJIA WILCOXSON ZOX:[id number] DOB: 01-19-1935 DOA: 10/09/2012 PCP: Crawford Givens, MD  Assessment/Plan  Retroperitoneal/perinephric hematoma, s/p 2 unit PRBC - No CT with contrast at this time unless urgent in order to protect renal function.   - hgb stable - Liberalize activity today and recheck hgb in AM.  If stable, may D/C. - Hold aspirin and Plavix for several weeks -  Appreciate urology recommendations  Anemia of acute blood loss with presumptive iron deficiency anemia:   -  Will need iron supplementation when tolerating diet  Type 2 diabetes mellitus, fingersticks 93-161, trending down.  A1c 7.7 - Continue SSI  HYPERLIPIDEMIA, stable.  cont statin  HYPERTENSION with borderline hypotension in the setting of acute bleeding -  Decrease dilt to 60mg  from 90 mg -  On a small dose of sotalol  CORONARY ARTERY DISEASE  Atrial fibrillation:  rate controlled, no ASA and plavix  -  Decreased dilt slightly on 1/6 due to borderline hypoTN in the setting of bleeding and blood pressures stable today, HR controlled.  Chronic kidney disease, stage III (moderate) creatinine at baseline of 1.65, but BUN and creatinine trending down today - Hold ACEI -  D/c IVF   Constipation, persistent -  Continue miralax and senna-docusate -  Milk of magnesia prn -  Add miralax  Diet:  CLD Access:  PIV x 2 IVF:  OFF Proph:  SCDs  Code Status: full code Family Communication: spoke with patient alone Disposition Plan: In AM if hemoglobin stable.   Consultants:  Urology  Procedures:  CT abd/pelvis without contrast  Antibiotics:  noen   HPI/Subjective: Patient states that she has stable pain in the left abdomen that radiates under her left rib cage, but denies flank pain.  Pain is only present with palpation and movement but not with rest.  Denies nausea, vomiting, chest pain, SOB.  Eating better, but still constipated.   Objective: Filed Vitals:   10/10/12 1718 10/10/12 2148 10/11/12 0526 10/11/12 0810  BP: 118/61 135/62 114/68 130/59  Pulse: 68 71 73 79  Temp: 98 F (36.7 C) 97.4 F (36.3 C) 99 F (37.2 C) 99 F (37.2 C)  TempSrc: Oral Oral Oral Oral  Resp: 18 18 18 18   Height:      Weight:      SpO2: 96% 95% 93% 94%    Intake/Output Summary (Last 24 hours) at 10/11/12 1747 Last data filed at 10/11/12 1544  Gross per 24 hour  Intake    480 ml  Output   1675 ml  Net  -1195 ml   Filed Weights   10/09/12 2044  Weight: 76.25 kg (168 lb 1.6 oz)    Exam:   General:  No acute distress  HEENT:  PERRL, MMM  Cardiovascular:  RRR, no murmurs, rubs or gallops  Respiratory:  CTAB  Abdomen:  NABS, soft, TTP in the left periumbilical area and towards the mid-axillary line slightly superiorly.  Ostomy bag with hard pebble like stools.    MSK:  Normal tone and bulk  Neuro:  Grossly intact  Data Reviewed: Basic Metabolic Panel:  Lab 10/11/12 8119 10/10/12 1100 10/09/12 1410  NA 138 137 133*  K 4.4 5.0 6.2*  CL 104 104 101  CO2 24 24 20   GLUCOSE 109* 144* 250*  BUN 29* 48* 48*  CREATININE 1.28* 1.65* 1.62*  CALCIUM 8.9 8.9 9.1  MG -- -- --  PHOS -- -- --   Liver Function Tests:  Lab  10/09/12 1410  AST 16  ALT 9  ALKPHOS 52  BILITOT 0.3  PROT 6.2  ALBUMIN 3.4*    Lab 10/09/12 1410  LIPASE 40  AMYLASE --   No results found for this basename: AMMONIA:5 in the last 168 hours CBC:  Lab 10/11/12 0720 10/10/12 1906 10/10/12 1547 10/10/12 1100 10/10/12 0015 10/09/12 1410  WBC 9.8 -- -- 10.8* -- 16.0*  NEUTROABS -- -- -- -- -- 13.7*  HGB 9.9* 9.6* 9.5* 8.6* 7.5* --  HCT 29.2* 28.3* 28.1* 25.5* 22.5* --  MCV 92.1 -- -- 93.4 -- 96.0  PLT 202 -- -- 222 -- 300   Cardiac Enzymes: No results found for this basename: CKTOTAL:5,CKMB:5,CKMBINDEX:5,TROPONINI:5 in the last 168 hours BNP (last 3 results) No results found for this basename: PROBNP:3 in the last 8760 hours CBG:  Lab 10/11/12 1126 10/11/12 0726  10/10/12 2150 10/10/12 1716 10/10/12 1151  GLUCAP 161* 107* 93 130* 121*    Recent Results (from the past 240 hour(s))  URINE CULTURE     Status: Normal   Collection Time   10/10/12  8:05 AM      Component Value Range Status Comment   Specimen Description URINE, CLEAN CATCH   Final    Special Requests NONE   Final    Culture  Setup Time 10/10/2012 09:13   Final    Colony Count NO GROWTH   Final    Culture NO GROWTH   Final    Report Status 10/11/2012 FINAL   Final      Studies: No results found.  Scheduled Meds:    . atorvastatin  20 mg Oral QHS  . diltiazem  60 mg Oral Daily  . ezetimibe  10 mg Oral Q48H  . insulin aspart  0-5 Units Subcutaneous QHS  . insulin aspart  0-9 Units Subcutaneous TID WC  . polyethylene glycol  34 g Oral Daily  . ranolazine  500 mg Oral BID  . senna-docusate  1 tablet Oral BID  . sodium chloride  3 mL Intravenous Q12H  . sotalol  40 mg Oral Daily  . vitamin B-12  1,000 mcg Oral Daily  . Vitamin D  1,000 Units Oral BID   Continuous Infusions:   Principal Problem:  *Retroperitoneal hematoma Active Problems:  Type 2 diabetes mellitus with vascular disease  HYPERLIPIDEMIA  ANEMIA-IRON DEFICIENCY  HYPERTENSION  CORONARY ARTERY DISEASE  Atrial fibrillation  Chronic kidney disease, stage III (moderate)  Abdominal pain, acute  Constipation    Time spent: 30 min    Celestine Prim  [facility] Pager [phone]. If 8PM-8AM, please contact night-coverage at [email or web address], password [facility] 10/11/2012, 5:47 PM  LOS: 2 days

## 2012-10-11 NOTE — Progress Notes (Signed)
Utilization review completed.  

## 2012-10-12 DIAGNOSIS — N2889 Other specified disorders of kidney and ureter: Secondary | ICD-10-CM | POA: Diagnosis not present

## 2012-10-12 LAB — CBC
Hemoglobin: 8.8 g/dL — ABNORMAL LOW (ref 12.0–15.0)
MCH: 31.1 pg (ref 26.0–34.0)
MCV: 94.3 fL (ref 78.0–100.0)
RBC: 2.83 MIL/uL — ABNORMAL LOW (ref 3.87–5.11)

## 2012-10-12 LAB — GLUCOSE, CAPILLARY
Glucose-Capillary: 147 mg/dL — ABNORMAL HIGH (ref 70–99)
Glucose-Capillary: 166 mg/dL — ABNORMAL HIGH (ref 70–99)
Glucose-Capillary: 269 mg/dL — ABNORMAL HIGH (ref 70–99)

## 2012-10-12 MED ORDER — HYDROCODONE-ACETAMINOPHEN 5-325 MG PO TABS: 1.0000 | ORAL_TABLET | ORAL | Status: DC | PRN

## 2012-10-12 NOTE — Progress Notes (Signed)
Patient ID: [name], female   DOB: 1934-11-07, 78 y.o.   MRN: [id number]   Patient not seen today but hemoglobin has been stable/going. There is no evidence of any ongoing bleeding. Her renal function is also improved. Patient potentially could be discharged today. She will need urologic followup in several weeks. Ultimately she will need to have imaging to rule out any underlying pathology in the kidney. We will be happy to physically, or evaluate her she has any difficulties or problems. We'll be happy to arrange appropriate urologic followup once she is discharged.

## 2012-10-12 NOTE — Progress Notes (Signed)
Pt received copy of discharge instructions, pt verbalizes understanding of instructions and signs/symptoms to follow up with MD, IV D/C'd, telemetry D/C'd, staff to take pt to discharge area

## 2012-10-12 NOTE — Discharge Summary (Signed)
Physician Discharge Summary  DARCHELLE NUNES ZOX:[id number] DOB: 01/26/1935 DOA: 10/09/2012  PCP: Crawford Givens, MD  Admit date: 10/09/2012 Discharge date: 10/12/2012  Time spent: 35  minutes  Recommendations for Outpatient Follow-up:  1. Patient would need to followup with her cardiologist to discuss reinitiation of Plavix. She would need to followup with Dr. Wallace Cullens P. for fever evaluation of retrograde tone and hematoma.  Discharge Diagnoses:  Retroperitoneal hematoma,/perinephric hematoma  Type 2 diabetes mellitus with vascular disease  HYPERLIPIDEMIA  ANEMIA-IRON DEFICIENCY  HYPERTENSION  CORONARY ARTERY DISEASE  Atrial fibrillation  Chronic kidney disease, stage III (moderate)  Abdominal pain, acute  Constipation   Discharge Condition: stable Diet recommendation: [employer] Healthy.  Filed Weights   10/09/12 2044 10/11/12 2144  Weight: 76.25 kg (168 lb 1.6 oz) 76.25 kg (168 lb 1.6 oz)    History of present illness:  Patient is a 78 year old female with history of atrial fibrillation on aspirin Plavix, coronary disease, type 2 diabetes, hypertension, hyperlipidemia, peripheral vascular disease presented to ED with pain in the left lower quadrant and flank that started this morning suddenly while she was reaching over to pick up something. Patient has a history of bowel perforation and history of colostomy. She denied any fevers or chills, cloudy urine. She continued to have intermittent abdominal pain throughout the day. She did have nausea but no vomiting.  ED workup showed Large hematoma involving the lower aspect of the left kidney. There is extensive blood and edema in the left retroperitoneum.   Hospital Course:  1-Retroperitoneal/perinephric hematoma, s/p 2 unit PRBC  -Unclear etiology, spontaneous perinephric hematoma. - hgb stable  - aspirin and Plavix was on hold. Patient will followup with cardiologist to discuss reinitiation of these medications.  - Appreciate urology  recommendations, they recommend  blood transfusion as needed and followup as an outpatient for further  Test. -Patient received 2 units of packed red blood cell, hemoglobin remained stable during hospitalization.  2-Anemia of acute blood loss with presumptive iron deficiency anemia:  - Will need iron supplementation when tolerating diet   3Type 2 diabetes mellitus, fingersticks 93-161, trending down. A1c 7.7  - Continue SSI . Will resume  hypoglycemic medications at discharge. 4-HYPERLIPIDEMIA, stable. cont statin  5-HYPERTENSION with borderline hypotension in the setting of acute bleeding  - Decrease dilt to 60mg  from 90 mg  - On a small dose of sotalol  6-CORONARY ARTERY DISEASE  Atrial fibrillation: rate controlled, no ASA and plavix  - Decreased dilt slightly on 1/6 due to borderline hypoTN in the setting of bleeding and blood pressures stable today, HR controlled.  7-Chronic kidney disease, stage III (moderate) creatinine at baseline of 1.65, but BUN and creatinine trending down today  - Hold ACEI  - D/c IVF  8-Constipation,  - Continue miralax and senna-docusate  - Add miralax   Procedures:  None.  Consultations:  Dr Isabel Caprice  Discharge Exam: Filed Vitals:   10/11/12 2144 10/12/12 0432 10/12/12 0900 10/12/12 1343  BP: 139/57 127/79 143/78 150/80  Pulse: 80 82 80 78  Temp: 99.1 F (37.3 C) 97.8 F (36.6 C) 98.5 F (36.9 C) 98.7 F (37.1 C)  TempSrc: Oral Oral Oral Oral  Resp: 18 18 18 18   Height:      Weight: 76.25 kg (168 lb 1.6 oz)     SpO2: 98% 94% 96% 96%    General: No distress. Cardiovascular: S 1, S 2 RRR Respiratory: CTA  Discharge Instructions  Discharge Orders    Future Appointments: Provider:  Department: Dept Phone: Center:   11/09/2012 9:00 AM [facility] [place] [phone] [id number]   11/14/2012 10:30 AM Joaquim Nam, MD [facility] [phone] [id number]     Future Orders Please  Complete By Expires   Diet - low sodium heart healthy      Increase activity slowly          Medication List     As of 10/12/2012  1:55 PM    STOP taking these medications         aspirin 81 MG EC tablet      benazepril 10 MG tablet   Commonly known as: LOTENSIN      clopidogrel 75 MG tablet   Commonly known as: PLAVIX      triamterene-hydrochlorothiazide 37.5-25 MG per capsule   Commonly known as: DYAZIDE      TAKE these medications         atorvastatin 20 MG tablet   Commonly known as: LIPITOR   Take 20 mg by mouth at bedtime.      BD INSULIN SYRINGE ULTRAFINE 31G X 5/16" 0.3 ML Misc   Generic drug: Insulin Syringe-Needle U-100   USE DAILY AND AS DIRECTED SLIDING SCALE      diltiazem 90 MG tablet   Commonly known as: CARDIZEM   Take 90 mg by mouth daily.      Fish Oil 1000 MG Caps   Take 1 capsule by mouth daily.      glyBURIDE 5 MG tablet   Commonly known as: DIABETA   Take 2 tablets (10 mg total) by mouth daily with breakfast.      HYDROcodone-acetaminophen 5-325 MG per tablet   Commonly known as: NORCO/VICODIN   Take 1 tablet by mouth every 4 (four) hours as needed.      insulin lispro 100 UNIT/ML injection   Commonly known as: HUMALOG   Use as directed per sliding scale: 200-250 = 4-5 Units, 251-300 = 5-6 Units, 301-350 = 6-7 Units.      meclizine 25 MG tablet   Commonly known as: ANTIVERT   Take 25 mg by mouth daily as needed. For dizziness      nitroGLYCERIN 0.4 MG SL tablet   Commonly known as: NITROSTAT   Place 0.4 mg under the tongue as needed. As directed      ONE TOUCH ULTRA TEST test strip   Generic drug: glucose blood   USE AS DIRECTED TO CHECK BLOOD SUGAR 4 TIMES A DAY PER SLIDING SCALE      pioglitazone 45 MG tablet   Commonly known as: ACTOS   Take 1 tablet (45 mg total) by mouth daily.      RANEXA 500 MG 12 hr tablet   Generic drug: ranolazine   Take 500 mg by mouth 2 (two) times daily.      sotalol 80 MG tablet   Commonly  known as: BETAPACE   Take 1/2  twice daily by mouth      vitamin B-12 1000 MCG tablet   Commonly known as: CYANOCOBALAMIN   Take 1,000 mcg by mouth daily.      Vitamin D 1000 UNITS capsule   Take 1,000 Units by mouth 2 (two) times daily.      ZETIA 10 MG tablet   Generic drug: ezetimibe   Take 10 mg by mouth every other day.          The results of significant diagnostics from this hospitalization (  including imaging, microbiology, ancillary and laboratory) are listed below for reference.    Significant Diagnostic Studies: Ct Abdomen Pelvis Wo Contrast  10/09/2012  *RADIOLOGY REPORT*  Clinical Data: Left abdominal pain.  Evaluate for kidney stone.  CT ABDOMEN AND PELVIS WITHOUT CONTRAST  Technique:  Multidetector CT imaging of the abdomen and pelvis was performed following the standard protocol without intravenous contrast.  Comparison: CT 11/27/2010  Findings: There is a 3 mm nodule in the right lower lobe which was present on 11/30/2003.  This likely represents a benign finding. There are stable pleural-based densities in the lower lobes, most likely associated with scarring.  There is no evidence for free intraperitoneal air.  There is a large amount of blood and stranding around the left kidney.  There is a hematoma along the inferior aspect of the left kidney that measures 14.0 x 7.1 cm.   An underlying left renal lesion is not identified but difficult to evaluate due to the blood products.  There is no evidence for a left renal stone.  No evidence for right kidney stones or hydronephrosis.  No gross abnormality to the liver.  There are small calcified gallstones and mild distention of the gallbladder.  No gross abnormality to the spleen, pancreas or adrenal glands.  There is a small hiatal hernia.  The patient has a very large parastomal hernia in the left lower abdomen.  The patient has a descending colostomy.  No gross abnormality to the urinary bladder.  The uterus is present.  There is  fluid in the pelvis and there may some blood products in this area.  Small bowel loops within the parastomal hernia.  There is no evidence to suggest a small bowel obstruction.  The aorta and branch vessels are heavily calcified.  No evidence for aneurysmal dilatation.  Diffuse osteopenia of the bones.  IMPRESSION: Large hematoma involving the lower aspect of the left kidney. There is extensive blood and edema in the left retroperitoneum. The etiology of the bleeding is unknown.  There is not an obvious renal lesion but difficult to evaluate the left kidney without contrast.  Large parastomal hernia in the left lower quadrant.  Cholelithiasis.  These results were called by telephone on 10/09/2012 at 2:50 p.m. to Dr. Judd Lien, who verbally acknowledged these results.   Original Report Authenticated By: Richarda Overlie, M.D.     Microbiology: Recent Results (from the past 240 hour(s))  URINE CULTURE     Status: Normal   Collection Time   10/10/12  8:05 AM      Component Value Range Status Comment   Specimen Description URINE, CLEAN CATCH   Final    Special Requests NONE   Final    Culture  Setup Time 10/10/2012 09:13   Final    Colony Count NO GROWTH   Final    Culture NO GROWTH   Final    Report Status 10/11/2012 FINAL   Final      Labs: Basic Metabolic Panel:  Lab 10/11/12 1610 10/10/12 1100 10/09/12 1410  NA 138 137 133*  K 4.4 5.0 6.2*  CL 104 104 101  CO2 24 24 20   GLUCOSE 109* 144* 250*  BUN 29* 48* 48*  CREATININE 1.28* 1.65* 1.62*  CALCIUM 8.9 8.9 9.1  MG -- -- --  PHOS -- -- --   Liver Function Tests:  Lab 10/09/12 1410  AST 16  ALT 9  ALKPHOS 52  BILITOT 0.3  PROT 6.2  ALBUMIN 3.4*  Lab 10/09/12 1410  LIPASE 40  AMYLASE --   No results found for this basename: AMMONIA:5 in the last 168 hours CBC:  Lab 10/12/12 0740 10/11/12 0720 10/10/12 1906 10/10/12 1547 10/10/12 1100 10/09/12 1410  WBC 10.4 9.8 -- -- 10.8* 16.0*  NEUTROABS -- -- -- -- -- 13.7*  HGB 8.8* 9.9* 9.6*  9.5* 8.6* --  HCT 26.7* 29.2* 28.3* 28.1* 25.5* --  MCV 94.3 92.1 -- -- 93.4 96.0  PLT 202 202 -- -- 222 300   Cardiac Enzymes: No results found for this basename: CKTOTAL:5,CKMB:5,CKMBINDEX:5,TROPONINI:5 in the last 168 hours BNP: BNP (last 3 results) No results found for this basename: PROBNP:3 in the last 8760 hours CBG:  Lab 10/12/12 1137 10/12/12 0713 10/11/12 2150 10/11/12 1709 10/11/12 1126  GLUCAP 269* 166* 147* 211* 161*       Signed:  Chandan Fly  [facility] 10/12/2012, 1:55 PM

## 2012-10-17 ENCOUNTER — Other Ambulatory Visit: Payer: Self-pay | Admitting: Family Medicine

## 2012-10-17 DIAGNOSIS — I4891 Unspecified atrial fibrillation: Secondary | ICD-10-CM | POA: Diagnosis not present

## 2012-10-17 DIAGNOSIS — I2510 Atherosclerotic heart disease of native coronary artery without angina pectoris: Secondary | ICD-10-CM | POA: Diagnosis not present

## 2012-10-17 DIAGNOSIS — I10 Essential (primary) hypertension: Secondary | ICD-10-CM | POA: Diagnosis not present

## 2012-10-21 ENCOUNTER — Encounter: Payer: Self-pay | Admitting: Family Medicine

## 2012-10-21 ENCOUNTER — Ambulatory Visit (INDEPENDENT_AMBULATORY_CARE_PROVIDER_SITE_OTHER): Payer: Medicare Other | Admitting: Family Medicine

## 2012-10-21 VITALS — BP 128/72 | HR 62 | Temp 98.1°F | Ht 66.0 in | Wt 162.2 lb

## 2012-10-21 DIAGNOSIS — D649 Anemia, unspecified: Secondary | ICD-10-CM | POA: Diagnosis not present

## 2012-10-21 DIAGNOSIS — E119 Type 2 diabetes mellitus without complications: Secondary | ICD-10-CM

## 2012-10-21 DIAGNOSIS — R58 Hemorrhage, not elsewhere classified: Secondary | ICD-10-CM | POA: Diagnosis not present

## 2012-10-21 DIAGNOSIS — K661 Hemoperitoneum: Secondary | ICD-10-CM

## 2012-10-21 LAB — CBC WITH DIFFERENTIAL/PLATELET
Basophils Absolute: 0 10*3/uL (ref 0.0–0.1)
Basophils Relative: 0.2 % (ref 0.0–3.0)
Eosinophils Absolute: 0.2 10*3/uL (ref 0.0–0.7)
Eosinophils Relative: 2.2 % (ref 0.0–5.0)
HCT: 30.7 % — ABNORMAL LOW (ref 36.0–46.0)
Hemoglobin: 10.1 g/dL — ABNORMAL LOW (ref 12.0–15.0)
Lymphocytes Relative: 12.6 % (ref 12.0–46.0)
Lymphs Abs: 1.3 10*3/uL (ref 0.7–4.0)
MCHC: 32.7 g/dL (ref 30.0–36.0)
MCV: 94.5 fl (ref 78.0–100.0)
Monocytes Absolute: 1.4 10*3/uL — ABNORMAL HIGH (ref 0.1–1.0)
Monocytes Relative: 13.5 % — ABNORMAL HIGH (ref 3.0–12.0)
Neutro Abs: 7.2 10*3/uL (ref 1.4–7.7)
Neutrophils Relative %: 71.5 % (ref 43.0–77.0)
Platelets: 464 10*3/uL — ABNORMAL HIGH (ref 150.0–400.0)
RBC: 3.25 Mil/uL — ABNORMAL LOW (ref 3.87–5.11)
RDW: 14.9 % — ABNORMAL HIGH (ref 11.5–14.6)
WBC: 10.1 10*3/uL (ref 4.5–10.5)

## 2012-10-21 LAB — BASIC METABOLIC PANEL
BUN: 34 mg/dL — ABNORMAL HIGH (ref 6–23)
CO2: 29 mEq/L (ref 19–32)
Calcium: 9.4 mg/dL (ref 8.4–10.5)
Chloride: 101 mEq/L (ref 96–112)
Creatinine, Ser: 1.5 mg/dL — ABNORMAL HIGH (ref 0.4–1.2)
GFR: 36.8 mL/min — ABNORMAL LOW (ref >60.00)
Glucose, Bld: 197 mg/dL — ABNORMAL HIGH (ref 70–99)
Potassium: 4.9 mEq/L (ref 3.5–5.1)

## 2012-10-21 NOTE — Assessment & Plan Note (Signed)
D/w pt.  She had elevated Cr during the admission.  Recheck Cr today.  Recheck CBC due to anemia during admission.  She may need iron def w/u and treatment if she doesn't have continued improvement in her H&H.  No other current source of blood loss known.   She is clinically improved.   Hospital records reviewed.   ASA per cards.   Will forward labs to cards and urology.   She has uro f/u pending.   >25 min spent with face to face with patient, >50% counseling and/or coordinating care.

## 2012-10-21 NOTE — Addendum Note (Signed)
Addended by: Joaquim Nam on: 10/21/2012 02:38 PM   Modules accepted: Orders

## 2012-10-21 NOTE — Patient Instructions (Addendum)
Go to the lab on the way out.  We'll contact you with your lab report and we'll send a copy to cardiology.  Cancel the 2/14 visit at [facility].  Recheck labs in 3 months before a visit with Para March.  We may need to recheck your labs again in the interval.

## 2012-10-21 NOTE — Progress Notes (Signed)
Hospitalized with spontaneous retroperitoneal hematoma.  Transfused and didn't require operative intervention.  H&H stabilized and she was discharged.  Since then she is back on ASA and ACE per cards.  Due for labs.  No other bleeding, bruising (other than resolving bruising from blood draw sites).  abd pain resolved, able to cough, sneeze, talk, move w/o pain.  No FCNAVD.  Still with colostomy intact and no blood in bag.   She has urology f/u pending for the end of the month.    All pertinent labs and imaging from hospitalization d/w pt.  A1c noted to 7.7.   Meds, vitals, and allergies reviewed.   ROS: See HPI.  Otherwise, noncontributory.  nad ncat Mmm rrr ctab abd soft, not ttp, normal BS Colostomy intact w/o blood in bag noted Ext w/o edema Resolving superficial bruising noted on the arms

## 2012-10-31 ENCOUNTER — Other Ambulatory Visit (INDEPENDENT_AMBULATORY_CARE_PROVIDER_SITE_OTHER): Payer: Medicare Other

## 2012-10-31 ENCOUNTER — Other Ambulatory Visit: Payer: Self-pay | Admitting: Family Medicine

## 2012-10-31 DIAGNOSIS — E119 Type 2 diabetes mellitus without complications: Secondary | ICD-10-CM

## 2012-10-31 DIAGNOSIS — D649 Anemia, unspecified: Secondary | ICD-10-CM | POA: Diagnosis not present

## 2012-10-31 LAB — CBC WITH DIFFERENTIAL/PLATELET
Basophils Absolute: 0 10*3/uL (ref 0.0–0.1)
Basophils Relative: 0.3 % (ref 0.0–3.0)
Eosinophils Absolute: 0.2 10*3/uL (ref 0.0–0.7)
Eosinophils Relative: 2.5 % (ref 0.0–5.0)
HCT: 31.9 % — ABNORMAL LOW (ref 36.0–46.0)
Hemoglobin: 10.4 g/dL — ABNORMAL LOW (ref 12.0–15.0)
Lymphocytes Relative: 14.6 % (ref 12.0–46.0)
MCHC: 32.7 g/dL (ref 30.0–36.0)
MCV: 94.4 fl (ref 78.0–100.0)
Monocytes Absolute: 0.9 10*3/uL (ref 0.1–1.0)
Monocytes Relative: 10.3 % (ref 3.0–12.0)
Neutro Abs: 6.3 10*3/uL (ref 1.4–7.7)
Neutrophils Relative %: 72.3 % (ref 43.0–77.0)
Platelets: 477 10*3/uL — ABNORMAL HIGH (ref 150.0–400.0)
RBC: 3.38 Mil/uL — ABNORMAL LOW (ref 3.87–5.11)
RDW: 15.1 % — ABNORMAL HIGH (ref 11.5–14.6)
WBC: 8.7 10*3/uL (ref 4.5–10.5)

## 2012-10-31 LAB — BASIC METABOLIC PANEL
BUN: 32 mg/dL — ABNORMAL HIGH (ref 6–23)
CO2: 28 mEq/L (ref 19–32)
Calcium: 9.3 mg/dL (ref 8.4–10.5)
Chloride: 105 mEq/L (ref 96–112)
Creatinine, Ser: 1.3 mg/dL — ABNORMAL HIGH (ref 0.4–1.2)
GFR: 41.67 mL/min — ABNORMAL LOW (ref >60.00)
Glucose, Bld: 262 mg/dL — ABNORMAL HIGH (ref 70–99)
Potassium: 4.6 mEq/L (ref 3.5–5.1)
Sodium: 139 mEq/L (ref 135–145)

## 2012-11-01 ENCOUNTER — Other Ambulatory Visit: Payer: Medicare Other

## 2012-11-01 DIAGNOSIS — I10 Essential (primary) hypertension: Secondary | ICD-10-CM | POA: Diagnosis not present

## 2012-11-01 DIAGNOSIS — I509 Heart failure, unspecified: Secondary | ICD-10-CM | POA: Diagnosis not present

## 2012-11-01 DIAGNOSIS — I2510 Atherosclerotic heart disease of native coronary artery without angina pectoris: Secondary | ICD-10-CM | POA: Diagnosis not present

## 2012-11-09 ENCOUNTER — Other Ambulatory Visit: Payer: Medicare Other

## 2012-11-10 ENCOUNTER — Other Ambulatory Visit (INDEPENDENT_AMBULATORY_CARE_PROVIDER_SITE_OTHER): Payer: Medicare Other

## 2012-11-10 DIAGNOSIS — D649 Anemia, unspecified: Secondary | ICD-10-CM

## 2012-11-10 LAB — IBC PANEL
Saturation Ratios: 21.1 % (ref 20.0–50.0)
Transferrin: 233.4 mg/dL (ref 212.0–360.0)

## 2012-11-11 NOTE — Addendum Note (Signed)
Addended by: Joaquim Nam on: 11/11/2012 08:12 AM   Modules accepted: Orders

## 2012-11-14 ENCOUNTER — Ambulatory Visit: Payer: Medicare Other | Admitting: Family Medicine

## 2012-11-28 DIAGNOSIS — S37019A Minor contusion of unspecified kidney, initial encounter: Secondary | ICD-10-CM | POA: Diagnosis not present

## 2012-12-11 DIAGNOSIS — M25579 Pain in unspecified ankle and joints of unspecified foot: Secondary | ICD-10-CM | POA: Diagnosis not present

## 2012-12-13 ENCOUNTER — Other Ambulatory Visit (INDEPENDENT_AMBULATORY_CARE_PROVIDER_SITE_OTHER): Payer: Medicare Other

## 2012-12-13 DIAGNOSIS — D649 Anemia, unspecified: Secondary | ICD-10-CM | POA: Diagnosis not present

## 2012-12-13 LAB — CBC WITH DIFFERENTIAL/PLATELET
Basophils Absolute: 0 10*3/uL (ref 0.0–0.1)
Basophils Relative: 0.3 % (ref 0.0–3.0)
Eosinophils Absolute: 0.2 10*3/uL (ref 0.0–0.7)
Eosinophils Relative: 2.7 % (ref 0.0–5.0)
HCT: 33 % — ABNORMAL LOW (ref 36.0–46.0)
Hemoglobin: 11 g/dL — ABNORMAL LOW (ref 12.0–15.0)
Lymphocytes Relative: 21 % (ref 12.0–46.0)
Lymphs Abs: 1.3 10*3/uL (ref 0.7–4.0)
MCHC: 33.4 g/dL (ref 30.0–36.0)
MCV: 92.6 fl (ref 78.0–100.0)
Monocytes Absolute: 0.8 10*3/uL (ref 0.1–1.0)
Monocytes Relative: 13.2 % — ABNORMAL HIGH (ref 3.0–12.0)
Neutro Abs: 3.9 10*3/uL (ref 1.4–7.7)
Neutrophils Relative %: 62.8 % (ref 43.0–77.0)
Platelets: 242 10*3/uL (ref 150.0–400.0)
RBC: 3.56 Mil/uL — ABNORMAL LOW (ref 3.87–5.11)
RDW: 15.8 % — ABNORMAL HIGH (ref 11.5–14.6)
WBC: 6.2 10*3/uL (ref 4.5–10.5)

## 2012-12-16 DIAGNOSIS — M25579 Pain in unspecified ankle and joints of unspecified foot: Secondary | ICD-10-CM | POA: Diagnosis not present

## 2013-01-10 ENCOUNTER — Other Ambulatory Visit (INDEPENDENT_AMBULATORY_CARE_PROVIDER_SITE_OTHER): Payer: Medicare Other

## 2013-01-10 DIAGNOSIS — E119 Type 2 diabetes mellitus without complications: Secondary | ICD-10-CM

## 2013-01-10 LAB — HEMOGLOBIN A1C: Hgb A1c MFr Bld: 8.2 % — ABNORMAL HIGH (ref 4.6–6.5)

## 2013-01-11 ENCOUNTER — Other Ambulatory Visit: Payer: Self-pay | Admitting: Family Medicine

## 2013-01-17 ENCOUNTER — Ambulatory Visit (INDEPENDENT_AMBULATORY_CARE_PROVIDER_SITE_OTHER): Payer: Medicare Other | Admitting: Family Medicine

## 2013-01-17 ENCOUNTER — Encounter: Payer: Self-pay | Admitting: Family Medicine

## 2013-01-17 VITALS — BP 132/64 | HR 71 | Temp 98.8°F | Wt 157.5 lb

## 2013-01-17 DIAGNOSIS — E1159 Type 2 diabetes mellitus with other circulatory complications: Secondary | ICD-10-CM | POA: Diagnosis not present

## 2013-01-17 DIAGNOSIS — E119 Type 2 diabetes mellitus without complications: Secondary | ICD-10-CM | POA: Diagnosis not present

## 2013-01-17 DIAGNOSIS — I798 Other disorders of arteries, arterioles and capillaries in diseases classified elsewhere: Secondary | ICD-10-CM | POA: Diagnosis not present
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
## 2013-01-17 NOTE — Patient Instructions (Addendum)
Try to work on your diet again and we'll recheck your A1c in another 3 months. Come see me after that.  Take care.  Glad to see you.

## 2013-01-19 NOTE — Assessment & Plan Note (Signed)
Labs d/w pt. She'll work on diet and we'll recheck in 3 months. No other changes in meantime. She agrees.

## 2013-01-25 ENCOUNTER — Other Ambulatory Visit: Payer: Self-pay | Admitting: Family Medicine

## 2013-02-07 IMAGING — CR DG CHEST 1V PORT
1 series · 1 of 1 positions shown · non-contrast
Comparison: Chest 12/05/2010.

CLINICAL DATA: Chest pain and shortness of breath.

PORTABLE CHEST - 1 VIEW

[AP]
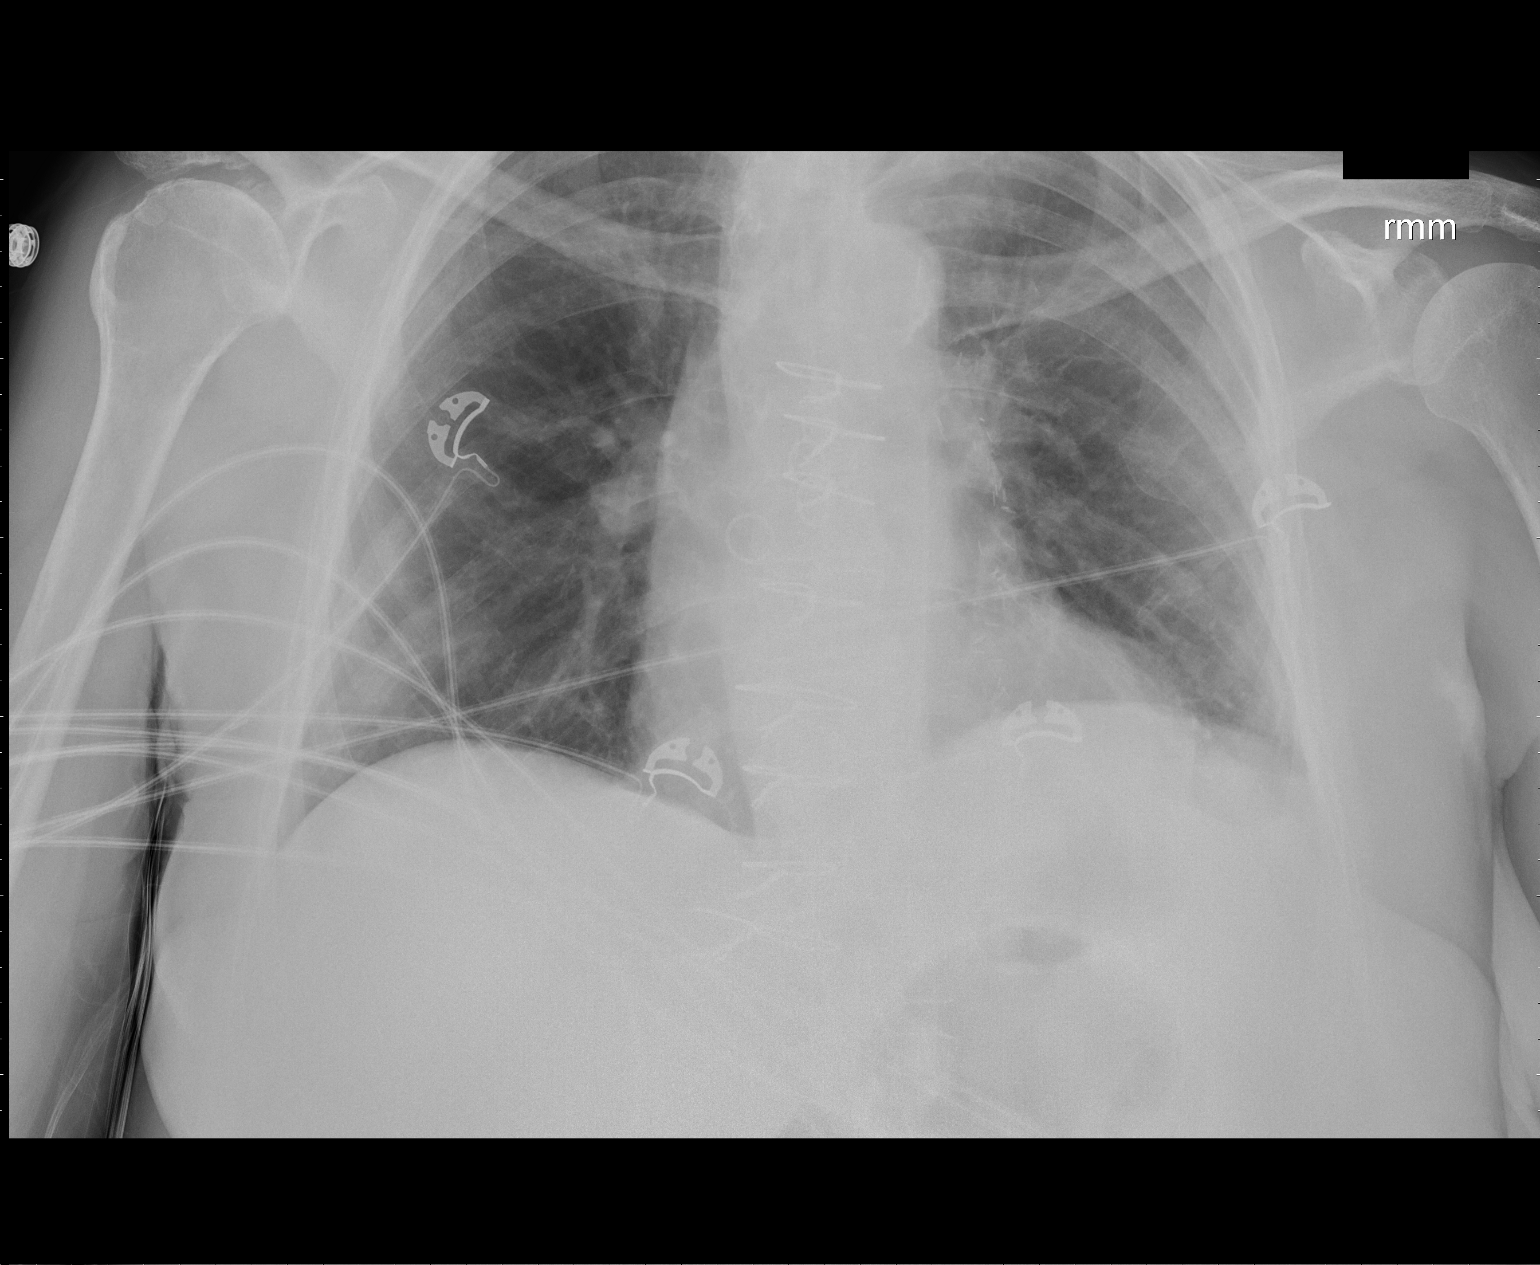

[1 of 1 positions shown; findings below may reference images not displayed]

FINDINGS: Lung volumes are low with some minimal atelectasis in the
left base.  No evidence of pulmonary edema. No pneumothorax or
effusion.  Heart size upper normal.
IMPRESSION: No acute finding.

## 2013-02-14 ENCOUNTER — Other Ambulatory Visit: Payer: Self-pay | Admitting: Family Medicine

## 2013-02-15 ENCOUNTER — Encounter: Payer: Self-pay | Admitting: Cardiovascular Disease

## 2013-02-16 ENCOUNTER — Encounter: Payer: Self-pay | Admitting: Cardiovascular Disease

## 2013-02-16 ENCOUNTER — Ambulatory Visit (INDEPENDENT_AMBULATORY_CARE_PROVIDER_SITE_OTHER): Payer: Medicare Other | Admitting: Cardiovascular Disease

## 2013-02-16 VITALS — BP 128/76 | HR 60 | Ht 65.0 in | Wt 159.5 lb

## 2013-02-16 DIAGNOSIS — Z79899 Other long term (current) drug therapy: Secondary | ICD-10-CM | POA: Diagnosis not present

## 2013-02-16 DIAGNOSIS — N182 Chronic kidney disease, stage 2 (mild): Secondary | ICD-10-CM

## 2013-02-16 DIAGNOSIS — I25810 Atherosclerosis of coronary artery bypass graft(s) without angina pectoris: Secondary | ICD-10-CM

## 2013-02-16 DIAGNOSIS — E1159 Type 2 diabetes mellitus with other circulatory complications: Secondary | ICD-10-CM

## 2013-02-16 DIAGNOSIS — E785 Hyperlipidemia, unspecified: Secondary | ICD-10-CM

## 2013-02-16 DIAGNOSIS — I2510 Atherosclerotic heart disease of native coronary artery without angina pectoris: Secondary | ICD-10-CM

## 2013-02-16 DIAGNOSIS — E7800 Pure hypercholesterolemia, unspecified: Secondary | ICD-10-CM | POA: Insufficient documentation

## 2013-02-16 DIAGNOSIS — I4891 Unspecified atrial fibrillation: Secondary | ICD-10-CM | POA: Diagnosis not present

## 2013-02-16 DIAGNOSIS — I10 Essential (primary) hypertension: Secondary | ICD-10-CM

## 2013-02-16 DIAGNOSIS — I798 Other disorders of arteries, arterioles and capillaries in diseases classified elsewhere: Secondary | ICD-10-CM

## 2013-02-16 DIAGNOSIS — E119 Type 2 diabetes mellitus without complications: Secondary | ICD-10-CM

## 2013-02-16 MED ORDER — EZETIMIBE 10 MG PO TABS: 10.0000 mg | ORAL_TABLET | ORAL | Status: DC

## 2013-02-16 NOTE — Assessment & Plan Note (Signed)
Excellent LDL C. levels at last check but she is due a repeat evaluation lab slip was given today

## 2013-02-16 NOTE — Assessment & Plan Note (Addendum)
Good arrhythmia control with sotalol. She cannot receive anticoagulants due to history of spontaneous perinephric hematoma while on aspirin Plavix. While the potential interaction between Ranexa and sotalol is duly noted she has tolerated this combination without any side effects has a normal QT interval and is clearly benefiting a lot from both drugs. Will continue low-dose aspirin alone for stroke prevention.

## 2013-02-16 NOTE — Assessment & Plan Note (Signed)
Well controlled. She is receiving ACE inhibitor therapy for chronic diabetes related renal disease as well

## 2013-02-16 NOTE — Progress Notes (Signed)
Patient ID: [name], female   DOB: April 15, 1935, 78 y.o.   MRN: [id number]  HPI  [name] is a 78 year old woman with a history of coronary disease, now 11 years status post post bypass surgery. She has total occlusion of the 3 saphenous vein grafts, but a patent internal mammary artery bypass to the LAD artery. There is total occlusion of the major oblique marginal artery and the right coronary artery with some collateral filling. She has a history of paroxysmal atrial fibrillation. While on treatment with aspirin and Plavix she developed a spontaneous retroperitoneal hematoma. She is therefore not taking potent anticoagulants. Despite a moderately depressed left ventricular systolic function with an ejection fraction of 35%, she does not have much in the way of symptoms of congestive heart failure. She has a mild to moderate degree of mitral insufficiency. She has a moderate to severely dilated left atrium.  She has not had any clinical symptoms of atrial fibrillation or any issues with worsening shortness of breath since her last appointment in January. She has chronic mild dyspnea New York Heart Association functional class II. She denies angina pectoris and chest pain has been well-controlled since therapy with a Ranexa was initiated. Of note she was unable to tolerate full dose Ranexa do to involuntary muscle twitching.  Significant comorbidities include type 2 diabetes mellitus, mixed hyperlipidemia  and systemic hypertension. She is due to have a lipid profile performed.  A few days ago she lost her balance and fell against stone post in her home. She has a large ecchymosis and hematoma in the left upper forearm.  Allergies  Allergen Reactions  . Metformin And Related     Held due to renal function  . Zocor (Simvastatin) Other (See Comments)    Myalgias    Current Outpatient Prescriptions  Medication Sig Dispense Refill  . aspirin EC 81 MG tablet Take 81 mg by mouth daily.      Marland Kitchen  atorvastatin (LIPITOR) 20 MG tablet Take 20 mg by mouth at bedtime.        . BD INSULIN SYRINGE ULTRAFINE 31G X 5/16" 0.3 ML MISC USE DAILY AND AS DIRECTED SLIDING SCALE  100 each  3  . benazepril (LOTENSIN) 10 MG tablet Take 1 tablet by mouth Daily.      . Cholecalciferol (VITAMIN D) 1000 UNITS capsule Take 1,000 Units by mouth 2 (two) times daily.        Marland Kitchen diltiazem (CARDIZEM) 90 MG tablet Take 90 mg by mouth daily.        Marland Kitchen ezetimibe (ZETIA) 10 MG tablet Take 10 mg by mouth every other day.       . glyBURIDE (DIABETA) 5 MG tablet TAKE 2 TABLETS (10 MG TOTAL) BY MOUTH DAILY WITH BREAKFAST.  180 tablet  3  . HUMALOG 100 UNIT/ML injection USE AS DIRECTED PER SLIDING SCALE: 200-250 = 4-5 UNITS, 251-300 = 5-6 UNITS, 301-350 = 6-7 UNITS.  10 mL  6  . insulin lispro (HUMALOG) 100 UNIT/ML injection Use as directed per sliding scale: 200-250 = 4-5 Units, 251-300 = 5-6 Units, 301-350 = 6-7 Units.  30 mL  3  . meclizine (ANTIVERT) 25 MG tablet Take 25 mg by mouth daily as needed. For dizziness      . Multiple Vitamin (MULTIVITAMIN) tablet Take 1 tablet by mouth daily.      . nitroGLYCERIN (NITROSTAT) 0.4 MG SL tablet Place 0.4 mg under the tongue as needed. As directed      .  Omega-3 Fatty Acids (FISH OIL) 1000 MG CAPS Take 1 capsule by mouth daily.       . ONE TOUCH ULTRA TEST test strip USE AS DIRECTED TO CHECK BLOOD SUGAR 4 TIMES A DAY PER SLIDING SCALE  100 each  5  . pioglitazone (ACTOS) 45 MG tablet Take 1 tablet (45 mg total) by mouth daily.  90 tablet  3  . RANEXA 500 MG 12 hr tablet Take 500 mg by mouth 2 (two) times daily.       . sotalol (BETAPACE) 80 MG tablet Take 1/2  twice daily by mouth       . vitamin B-12 (CYANOCOBALAMIN) 1000 MCG tablet Take 1,000 mcg by mouth daily.        No current facility-administered medications for this visit.    Past Medical History  Diagnosis Date  . Anemia, iron deficiency   . History of breast cancer     Followed yearly by [facility]  . Coronary artery  disease   . Diabetes mellitus, type 2   . Hyperlipemia   . Hypertension   . Osteoporosis   . Peripheral vascular disease   . 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 Diverticulitis   . Atrial fibrillation 11/28/2010    2D Echo - EF-30-35, left atrium moderate to severely dilated, right ventricle moderately dilated, tricuspid valve mild-moderate regurgitation    Past Surgical History  Procedure Laterality Date  . Appendectomy  1962  . Dilation and curettage of uterus  1971     miscarrage, abnormal bleeding  . Dilation and curettage of uterus  1995    Menorrhagia  . Breast surgery  02/01    Quadrectomy Breast cancer (Dr. Daphine Deutscher)  . Coronary artery bypass graft  02/09/02    X 4 ([place])  . Shoulder arthroscopy  05/04    Right, with rotator cuff debridement  . Hemorrhoid surgery  09/11/04    Binding Daphine Deutscher)  . Cataract extraction  10/10 and 02/11  . Colostomy    . Partial colectomy    . Cardiac catheterization  2000    Iliac Stent (Dr. Guinevere Scarlet) yearly follow-up  . Cardiac catheterization  01/25/02    High grade LAD disease, total of 3 vess, involvement  . Cardiac catheterization  01/13/07    SV graft to diag. occluded but good backfill o/w ok  . Cardiac catheterization  02/14/10    PTCA & stent vein graft to OM/RCA ([place])  . Cardiac catheterization  04/17/2011    Saphenous vein grafts to the RCA 100% occluded, to the OM 100% occluded, and the diagonal 100% occluded. Internal mammary artery to the LAD, widely patent.    Family History  Problem Relation Age of Onset  . Hypertension Mother   . Diabetes Mother   . Hypertension Father   . Heart disease Father 54    Heart attack  . Heart disease Brother     CHF  . Hypertension Brother   . COPD Brother     bronchial, frequent pneumonia  . Hypertension Brother   . Hyperlipidemia Brother   . Heart disease Brother     CABG  . Hypertension Brother   . Gout Brother   . Obesity Brother   . Diabetes Son   .  Hypertension Son     History   Social History  . Marital Status: Married    Spouse Name: N/A    Number of Children: 2  . Years of  Education: N/A   Occupational History  . Retired    Social History Main Topics  . Smoking status: Never Smoker   . Smokeless tobacco: Never Used  . Alcohol Use: No  . Drug Use: No  . Sexually Active: Not on file   Other Topics Concern  . Not on file   Social History Narrative   Retired 1991 from [employer] work   Widowed as of 04/2011 after 50+ years   Lives alone   2 kids in [place]   Enjoys travelling and reading    ROS:The patient specifically denies any chest pain at rest exertion, dyspnea at rest, orthopnea, paroxysmal nocturnal dyspnea, syncope, palpitations, focal neurological deficits, intermittent claudication, lower extremity edema, unexplained weight gain, cough, hemoptysis or wheezing.  She does endorse new Heart Association functional class II dyspnea on exertion and easy bruising.   PHYSICAL EXAM BP 128/76  Pulse 60  Ht 5\' 5"  (1.651 m)  Wt 159 lb 8 oz (72.349 kg)  BMI 26.54 kg/m2  General: Alert, oriented x3, no distress Head: no evidence of trauma, PERRL, EOMI, no exophtalmos or lid lag, no myxedema, no xanthelasma; normal ears, nose and oropharynx Neck: normal jugular venous pulsations and no hepatojugular reflux; brisk carotid pulses without delay and no carotid bruits Chest: clear to auscultation, no signs of consolidation by percussion or palpation, normal fremitus, symmetrical and full respiratory excursions. Well-healed sternotomy scar Cardiovascular: normal position and quality of the apical impulse, regular rhythm, normal first and second heart sounds, no  rubs or gallops. Grade 1/6 soft systolic murmur at the apex with limited radiation to axilla Abdomen: no tenderness or distention, no masses by palpation, no abnormal pulsatility or arterial bruits, normal bowel sounds, no hepatosplenomegaly Extremities: no  clubbing, cyanosis or edema; 2+ radial, ulnar and brachial pulses bilaterally; 2+ right femoral, posterior tibial and dorsalis pedis pulses; 2+ left femoral, posterior tibial and dorsalis pedis pulses; no subclavian or femoral bruits Prominent ecchymosis of left upper forearm, minimally tender Neurological: grossly nonfocal   EKG: Sinus bradycardia at 55 beats per minute, probable old inferior infarction, poor R-wave progression, normal QT interval at 450 ms.  Lipid Panel     Component Value Date/Time   CHOL 133 01/13/2012 0913   TRIG 151.0* 01/13/2012 0913   HDL 42.80 01/13/2012 0913   CHOLHDL 3 01/13/2012 0913   VLDL 30.2 01/13/2012 0913   LDLCALC 60 01/13/2012 0913    BMET    Component Value Date/Time   NA 139 10/31/2012 0952   K 4.6 10/31/2012 0952   CL 105 10/31/2012 0952   CO2 28 10/31/2012 0952   GLUCOSE 262* 10/31/2012 0952   BUN 32* 10/31/2012 0952   CREATININE 1.3* 10/31/2012 0952   CALCIUM 9.3 10/31/2012 0952   GFRNONAA 40* 10/11/2012 0720   GFRAA 46* 10/11/2012 0720   Recent hemoglobin A1c of 8.2%  ASSESSMENT AND PLAN  CORONARY ARTERY DISEASE Despite extensive native and graft coronary artery disease she is currently free of angina. She seems to have significantly benefited from treatment with ranolazine is seen even though she did not tolerate the full dose. Her most recent nuclear scan in 2011 showed evidence of ischemia in the territory of the left circumflex coronary artery as well as ischemia in the distal LAD distribution. Cardiac catheterization unfortunately did not show any options for revascularization. She was also seen at that time by Dr. Donata Clay, and recommended medical management.  Continue same medications.  Atrial fibrillation Good arrhythmia control with sotalol.  She cannot receive anticoagulants due to history of spontaneous perinephric hematoma while on aspirin Plavix. If the potential interaction between Ranexa and sotalol is duly noted she has tolerated this  combination without any side effects has a normal QT interval and is clearly benefiting a lot from both drugs. Will continue low-dose aspirin alone for stroke prevention.  HYPERLIPIDEMIA Excellent LDL C. levels at last check but she is due a repeat evaluation lab slip was given today  HYPERTENSION Well controlled. She is receiving ACE inhibitor therapy for chronic diabetes related renal disease as well  Type 2 diabetes mellitus with vascular disease Her hemoglobin A1c is high. This is also likely responsible for her borderline high triglyceride levels. Greater attention to glucose control was recommended.     Jacky Hartung

## 2013-02-16 NOTE — Assessment & Plan Note (Signed)
Her hemoglobin A1c is high. This is also likely responsible for her borderline high triglyceride levels. Greater attention to glucose control was recommended.

## 2013-02-16 NOTE — Assessment & Plan Note (Signed)
Despite extensive native and graft coronary artery disease she is currently free of angina. She seems to have significantly benefited from treatment with ranolazine is seen even though she did not tolerate the full dose. Her most recent nuclear scan in 2011 showed evidence of ischemia in the territory of the left circumflex coronary artery as well as ischemia in the distal LAD distribution. Cardiac catheterization unfortunately did not show any options for revascularization. She was also seen at that time by Dr. Donata Clay, and recommended medical management.  Continue same medications.

## 2013-02-16 NOTE — Patient Instructions (Addendum)
No change in medications. Follow up in 6 months. We will call with lab results. Please call us if you have not received a call in one week.

## 2013-02-17 DIAGNOSIS — E785 Hyperlipidemia, unspecified: Secondary | ICD-10-CM | POA: Diagnosis not present

## 2013-02-17 DIAGNOSIS — Z79899 Other long term (current) drug therapy: Secondary | ICD-10-CM | POA: Diagnosis not present

## 2013-02-17 LAB — CBC
HCT: 34.7 % — ABNORMAL LOW (ref 36.0–46.0)
Hemoglobin: 11.4 g/dL — ABNORMAL LOW (ref 12.0–15.0)
MCH: 30.1 pg (ref 26.0–34.0)
MCHC: 32.9 g/dL (ref 30.0–36.0)
MCV: 91.6 fL (ref 78.0–100.0)
Platelets: 291 10*3/uL (ref 150–400)
RBC: 3.79 MIL/uL — ABNORMAL LOW (ref 3.87–5.11)
RDW: 15.3 % (ref 11.5–15.5)
WBC: 6.5 10*3/uL (ref 4.0–10.5)

## 2013-02-17 LAB — COMPREHENSIVE METABOLIC PANEL
ALT: 12 U/L (ref 0–35)
AST: 15 U/L (ref 0–37)
Albumin: 4 g/dL (ref 3.5–5.2)
Alkaline Phosphatase: 58 U/L (ref 39–117)
BUN: 26 mg/dL — ABNORMAL HIGH (ref 6–23)
CO2: 29 mEq/L (ref 19–32)
Calcium: 9.5 mg/dL (ref 8.4–10.5)
Chloride: 104 mEq/L (ref 96–112)
Creat: 1.3 mg/dL — ABNORMAL HIGH (ref 0.50–1.10)
Glucose, Bld: 110 mg/dL — ABNORMAL HIGH (ref 70–99)
Potassium: 4.7 mEq/L (ref 3.5–5.3)
Sodium: 140 mEq/L (ref 135–145)
Total Bilirubin: 0.5 mg/dL (ref 0.3–1.2)
Total Protein: 6.2 g/dL (ref 6.0–8.3)

## 2013-02-17 LAB — LIPID PANEL
Cholesterol: 146 mg/dL (ref 0–200)
HDL: 43 mg/dL (ref >39)
LDL Cholesterol: 76 mg/dL (ref 0–99)
Total CHOL/HDL Ratio: 3.4 Ratio
Triglycerides: 137 mg/dL (ref <150)
VLDL: 27 mg/dL (ref 0–40)

## 2013-03-08 ENCOUNTER — Other Ambulatory Visit: Payer: Self-pay | Admitting: Family Medicine

## 2013-03-17 DIAGNOSIS — H26499 Other secondary cataract, unspecified eye: Secondary | ICD-10-CM | POA: Diagnosis not present

## 2013-04-13 ENCOUNTER — Other Ambulatory Visit (INDEPENDENT_AMBULATORY_CARE_PROVIDER_SITE_OTHER): Payer: Medicare Other

## 2013-04-13 DIAGNOSIS — E119 Type 2 diabetes mellitus without complications: Secondary | ICD-10-CM | POA: Diagnosis not present

## 2013-04-20 ENCOUNTER — Ambulatory Visit (INDEPENDENT_AMBULATORY_CARE_PROVIDER_SITE_OTHER): Payer: Medicare Other | Admitting: Family Medicine

## 2013-04-20 ENCOUNTER — Encounter: Payer: Self-pay | Admitting: Family Medicine

## 2013-04-20 VITALS — BP 110/70 | HR 66 | Temp 97.9°F | Wt 162.0 lb

## 2013-04-20 DIAGNOSIS — E1159 Type 2 diabetes mellitus with other circulatory complications: Secondary | ICD-10-CM

## 2013-04-20 DIAGNOSIS — I798 Other disorders of arteries, arterioles and capillaries in diseases classified elsewhere: Secondary | ICD-10-CM

## 2013-04-20 DIAGNOSIS — E119 Type 2 diabetes mellitus without complications: Secondary | ICD-10-CM

## 2013-04-20 MED ORDER — INSULIN ASPART 100 UNIT/ML ~~LOC~~ SOLN: SUBCUTANEOUS | Status: DC

## 2013-04-20 NOTE — Progress Notes (Signed)
Diabetes:  Using medications without difficulties: yes Hypoglycemic episodes: very rare Hyperglycemic episodes: occ, after supper Feet problems: no Blood Sugars averaging: usually 90-110s in AM eye exam within last year: yes, last month.  A1c unchanged at 8.2.   She is usually taking insulin (sliding scale) 2-3 times a day.  6 units per meal is her most common dose.   Her meals are irregular.  We discussed.   I "need to work on my meals, more than the medicines."  She has a hernia at her colostomy site.  She is going to talk to medical supply store about options.  She has occ bag leak, better recently.    Meds, vitals, and allergies reviewed.   ROS: See HPI.  Otherwise negative.    GEN: nad, alert and oriented HEENT: mucous membranes moist NECK: supple w/o LA CV: rrr. occ ectopy noted during exam PULM: ctab, no inc wob ABD: soft, +bs, hernia at the colostomy site noted, soft EXT: no edema SKIN: no acute rash  Diabetic foot exam: Normal inspection No skin breakdown No calluses  Normal DP pulses Normal sensation to light touch and monofilament Nails normal

## 2013-04-20 NOTE — Patient Instructions (Addendum)
Recheck in about 3-4 months, A1c ahead of time.  I would work to normalize your meal schedule and content instead of changing your meds.  Take care.  Glad to see you.

## 2013-04-21 NOTE — Assessment & Plan Note (Signed)
A1c unchanged at 8.2.   She is usually taking insulin (sliding scale) 2-3 times a day.  6 units per meal is her most common dose.   Her meals are irregular.  We discussed.   I "need to work on my meals, more than the medicines." Recheck A1c later in 2014, she'll work on meals in meantime. No change in meds, other than generic insulin substitution.

## 2013-05-01 DIAGNOSIS — M948X9 Other specified disorders of cartilage, unspecified sites: Secondary | ICD-10-CM | POA: Diagnosis not present

## 2013-05-01 DIAGNOSIS — E119 Type 2 diabetes mellitus without complications: Secondary | ICD-10-CM | POA: Diagnosis not present

## 2013-05-01 DIAGNOSIS — M25569 Pain in unspecified knee: Secondary | ICD-10-CM | POA: Diagnosis not present

## 2013-05-01 DIAGNOSIS — S82109A Unspecified fracture of upper end of unspecified tibia, initial encounter for closed fracture: Secondary | ICD-10-CM | POA: Diagnosis not present

## 2013-05-01 DIAGNOSIS — S8990XA Unspecified injury of unspecified lower leg, initial encounter: Secondary | ICD-10-CM | POA: Diagnosis not present

## 2013-05-01 DIAGNOSIS — T1490XA Injury, unspecified, initial encounter: Secondary | ICD-10-CM | POA: Diagnosis not present

## 2013-05-01 DIAGNOSIS — S0003XA Contusion of scalp, initial encounter: Secondary | ICD-10-CM | POA: Diagnosis not present

## 2013-05-01 DIAGNOSIS — I10 Essential (primary) hypertension: Secondary | ICD-10-CM | POA: Diagnosis not present

## 2013-05-01 DIAGNOSIS — S1093XA Contusion of unspecified part of neck, initial encounter: Secondary | ICD-10-CM | POA: Diagnosis not present

## 2013-05-01 DIAGNOSIS — S0083XA Contusion of other part of head, initial encounter: Secondary | ICD-10-CM | POA: Diagnosis not present

## 2013-05-01 DIAGNOSIS — S0990XA Unspecified injury of head, initial encounter: Secondary | ICD-10-CM | POA: Diagnosis not present

## 2013-05-01 DIAGNOSIS — I519 Heart disease, unspecified: Secondary | ICD-10-CM | POA: Diagnosis not present

## 2013-05-01 DIAGNOSIS — S298XXA Other specified injuries of thorax, initial encounter: Secondary | ICD-10-CM | POA: Diagnosis not present

## 2013-05-01 DIAGNOSIS — S199XXA Unspecified injury of neck, initial encounter: Secondary | ICD-10-CM | POA: Diagnosis not present

## 2013-05-01 DIAGNOSIS — S20219A Contusion of unspecified front wall of thorax, initial encounter: Secondary | ICD-10-CM | POA: Diagnosis not present

## 2013-05-01 DIAGNOSIS — IMO0002 Reserved for concepts with insufficient information to code with codable children: Secondary | ICD-10-CM | POA: Diagnosis not present

## 2013-05-01 DIAGNOSIS — S6990XA Unspecified injury of unspecified wrist, hand and finger(s), initial encounter: Secondary | ICD-10-CM | POA: Diagnosis not present

## 2013-05-01 DIAGNOSIS — S0993XA Unspecified injury of face, initial encounter: Secondary | ICD-10-CM | POA: Diagnosis not present

## 2013-05-01 DIAGNOSIS — M25469 Effusion, unspecified knee: Secondary | ICD-10-CM | POA: Diagnosis not present

## 2013-05-01 DIAGNOSIS — S99929A Unspecified injury of unspecified foot, initial encounter: Secondary | ICD-10-CM | POA: Diagnosis not present

## 2013-05-01 DIAGNOSIS — R937 Abnormal findings on diagnostic imaging of other parts of musculoskeletal system: Secondary | ICD-10-CM | POA: Diagnosis not present

## 2013-05-01 DIAGNOSIS — S4980XA Other specified injuries of shoulder and upper arm, unspecified arm, initial encounter: Secondary | ICD-10-CM | POA: Diagnosis not present

## 2013-05-02 ENCOUNTER — Telehealth: Payer: Self-pay

## 2013-05-02 NOTE — Telephone Encounter (Signed)
Randy left v/m requesting our office arrange medical air transport for pt to return home from [facility] [place] where she fell 05/01/13; was seen at ED and pt has fractured tibia and little finger which could not be casted due to swelling.  I spoke with Harvie Heck and he has already arranged medical air transport for pt to return home and Harvie Heck will contact local orthopedic upon her return. Harvie Heck does not request cb ; wanted Dr Para March to be aware.

## 2013-05-03 NOTE — Telephone Encounter (Signed)
Noted, thanks!

## 2013-05-04 ENCOUNTER — Encounter: Payer: Self-pay | Admitting: Family Medicine

## 2013-05-04 ENCOUNTER — Ambulatory Visit (INDEPENDENT_AMBULATORY_CARE_PROVIDER_SITE_OTHER): Payer: Medicare Other | Admitting: Family Medicine

## 2013-05-04 VITALS — BP 122/60 | HR 72 | Temp 97.9°F

## 2013-05-04 DIAGNOSIS — E1159 Type 2 diabetes mellitus with other circulatory complications: Secondary | ICD-10-CM

## 2013-05-04 DIAGNOSIS — Z933 Colostomy status: Secondary | ICD-10-CM

## 2013-05-04 DIAGNOSIS — M25569 Pain in unspecified knee: Secondary | ICD-10-CM | POA: Diagnosis not present

## 2013-05-04 DIAGNOSIS — S8290XS Unspecified fracture of unspecified lower leg, sequela: Secondary | ICD-10-CM | POA: Diagnosis not present

## 2013-05-04 DIAGNOSIS — I798 Other disorders of arteries, arterioles and capillaries in diseases classified elsewhere: Secondary | ICD-10-CM

## 2013-05-04 DIAGNOSIS — S82202S Unspecified fracture of shaft of left tibia, sequela: Secondary | ICD-10-CM

## 2013-05-04 DIAGNOSIS — M25549 Pain in joints of unspecified hand: Secondary | ICD-10-CM | POA: Diagnosis not present

## 2013-05-04 DIAGNOSIS — H2510 Age-related nuclear cataract, unspecified eye: Secondary | ICD-10-CM | POA: Diagnosis not present

## 2013-05-04 DIAGNOSIS — Z961 Presence of intraocular lens: Secondary | ICD-10-CM | POA: Diagnosis not present

## 2013-05-04 DIAGNOSIS — I10 Essential (primary) hypertension: Secondary | ICD-10-CM | POA: Diagnosis not present

## 2013-05-04 LAB — BASIC METABOLIC PANEL
BUN: 44 mg/dL — ABNORMAL HIGH (ref 6–23)
CO2: 25 mEq/L (ref 19–32)
Chloride: 99 mEq/L (ref 96–112)
Creatinine, Ser: 2.1 mg/dL — ABNORMAL HIGH (ref 0.4–1.2)
GFR: 23.83 mL/min — ABNORMAL LOW (ref >60.00)
Glucose, Bld: 149 mg/dL — ABNORMAL HIGH (ref 70–99)
Potassium: 5.4 mEq/L — ABNORMAL HIGH (ref 3.5–5.1)
Sodium: 131 mEq/L — ABNORMAL LOW (ref 135–145)

## 2013-05-04 NOTE — Patient Instructions (Addendum)
See Shirlee Limerick about your referral before you leave today. We'll contact you with your lab report.  Take care.  Use a stool softener if needed.  Let me know if you have concerns.

## 2013-05-05 ENCOUNTER — Encounter: Payer: Self-pay | Admitting: Family Medicine

## 2013-05-05 ENCOUNTER — Other Ambulatory Visit: Payer: Self-pay | Admitting: Family Medicine

## 2013-05-05 DIAGNOSIS — S82202A Unspecified fracture of shaft of left tibia, initial encounter for closed fracture: Secondary | ICD-10-CM | POA: Insufficient documentation

## 2013-05-05 DIAGNOSIS — R7989 Other specified abnormal findings of blood chemistry: Secondary | ICD-10-CM

## 2013-05-05 NOTE — Assessment & Plan Note (Signed)
Sugar has been controlled recently. Continue as is.

## 2013-05-05 NOTE — Progress Notes (Signed)
Fell at [facility].  No LOC. She knew she was falling and tried to brace herself. Hit R side of head, R 5th finger fx, L tibia fx noted.  Seen at hospital, outside films reviewed. Splinted. Now back home.  Family living with patient. Can't walk.  R hand ROM dec due to splint and fx, she has trouble fixing her colostomy bag in the meantime. Can't get in/out of car w/o sig assistance.  Has ortho f/u today.  Has been dehydrated recently with less UOP, less fluid intake.    Meds, vitals, and allergies reviewed.   ROS: See HPI.  Otherwise, noncontributory.  R side of face with bruising, R subconjunctival hemorrhage noted.  Locally tender on R supra/lateral portion of orbit OP wnl Neck supple rrr ctab abd soft R lower ribs ttp Ext w/o edema R hand and L leg splinted In wheelchair.   Not weight bearing

## 2013-05-05 NOTE — Assessment & Plan Note (Signed)
Will set up [facility] eval.  Not weight bearing.  Has family help. Has ortho eval today.  Films reviewed.  Pain controlled.  Continue as is. >25 min spent with face to face with patient, >50% counseling and/or coordinating care

## 2013-05-05 NOTE — Assessment & Plan Note (Signed)
See notes on labs. 

## 2013-05-05 NOTE — Assessment & Plan Note (Signed)
D/w pt about bowel regimen with pain meds. She understood.

## 2013-05-08 ENCOUNTER — Other Ambulatory Visit: Payer: Self-pay | Admitting: Family Medicine

## 2013-05-08 ENCOUNTER — Other Ambulatory Visit (INDEPENDENT_AMBULATORY_CARE_PROVIDER_SITE_OTHER): Payer: Medicare Other

## 2013-05-08 ENCOUNTER — Telehealth: Payer: Self-pay

## 2013-05-08 DIAGNOSIS — R799 Abnormal finding of blood chemistry, unspecified: Secondary | ICD-10-CM | POA: Diagnosis not present

## 2013-05-08 DIAGNOSIS — Z9181 History of falling: Secondary | ICD-10-CM | POA: Diagnosis not present

## 2013-05-08 DIAGNOSIS — E119 Type 2 diabetes mellitus without complications: Secondary | ICD-10-CM | POA: Diagnosis not present

## 2013-05-08 DIAGNOSIS — Z933 Colostomy status: Secondary | ICD-10-CM | POA: Diagnosis not present

## 2013-05-08 DIAGNOSIS — R7989 Other specified abnormal findings of blood chemistry: Secondary | ICD-10-CM

## 2013-05-08 DIAGNOSIS — IMO0001 Reserved for inherently not codable concepts without codable children: Secondary | ICD-10-CM | POA: Diagnosis not present

## 2013-05-08 DIAGNOSIS — S8290XD Unspecified fracture of unspecified lower leg, subsequent encounter for closed fracture with routine healing: Secondary | ICD-10-CM | POA: Diagnosis not present

## 2013-05-08 DIAGNOSIS — E875 Hyperkalemia: Secondary | ICD-10-CM

## 2013-05-08 LAB — BASIC METABOLIC PANEL
BUN: 37 mg/dL — ABNORMAL HIGH (ref 6–23)
CO2: 27 mEq/L (ref 19–32)
Calcium: 9.2 mg/dL (ref 8.4–10.5)
Chloride: 100 mEq/L (ref 96–112)
Creatinine, Ser: 1.5 mg/dL — ABNORMAL HIGH (ref 0.4–1.2)
GFR: 37.34 mL/min — ABNORMAL LOW (ref >60.00)
Glucose, Bld: 212 mg/dL — ABNORMAL HIGH (ref 70–99)
Potassium: 5.3 mEq/L — ABNORMAL HIGH (ref 3.5–5.1)
Sodium: 135 mEq/L (ref 135–145)

## 2013-05-08 NOTE — Telephone Encounter (Signed)
[name] pts son called to see if someone could come out to the car to draw pts blood this AM due to limited mobility. [name] in lab said for [name] to come in and ck pt in and request someone to come to car and they will come out and draw labs.

## 2013-05-09 ENCOUNTER — Telehealth: Payer: Self-pay

## 2013-05-09 NOTE — Telephone Encounter (Signed)
Ann PT with [facility]  Left v/m requesting verbal order for PT 2 x a week for 3 weeks and 1 x a week for 1 week for gait training and transfer and exercises.

## 2013-05-09 NOTE — Telephone Encounter (Signed)
Left detailed message on voicemail.  

## 2013-05-09 NOTE — Telephone Encounter (Signed)
Please give the order.  Thanks.   

## 2013-05-10 DIAGNOSIS — S8290XD Unspecified fracture of unspecified lower leg, subsequent encounter for closed fracture with routine healing: Secondary | ICD-10-CM | POA: Diagnosis not present

## 2013-05-10 DIAGNOSIS — Z933 Colostomy status: Secondary | ICD-10-CM | POA: Diagnosis not present

## 2013-05-10 DIAGNOSIS — E119 Type 2 diabetes mellitus without complications: Secondary | ICD-10-CM | POA: Diagnosis not present

## 2013-05-10 DIAGNOSIS — Z9181 History of falling: Secondary | ICD-10-CM | POA: Diagnosis not present

## 2013-05-10 DIAGNOSIS — IMO0001 Reserved for inherently not codable concepts without codable children: Secondary | ICD-10-CM | POA: Diagnosis not present

## 2013-05-11 DIAGNOSIS — M25549 Pain in joints of unspecified hand: Secondary | ICD-10-CM | POA: Diagnosis not present

## 2013-05-15 DIAGNOSIS — Z933 Colostomy status: Secondary | ICD-10-CM | POA: Diagnosis not present

## 2013-05-15 DIAGNOSIS — IMO0001 Reserved for inherently not codable concepts without codable children: Secondary | ICD-10-CM | POA: Diagnosis not present

## 2013-05-15 DIAGNOSIS — E119 Type 2 diabetes mellitus without complications: Secondary | ICD-10-CM | POA: Diagnosis not present

## 2013-05-15 DIAGNOSIS — Z9181 History of falling: Secondary | ICD-10-CM | POA: Diagnosis not present

## 2013-05-15 DIAGNOSIS — S8290XD Unspecified fracture of unspecified lower leg, subsequent encounter for closed fracture with routine healing: Secondary | ICD-10-CM | POA: Diagnosis not present

## 2013-05-16 ENCOUNTER — Other Ambulatory Visit (INDEPENDENT_AMBULATORY_CARE_PROVIDER_SITE_OTHER): Payer: Medicare Other

## 2013-05-16 DIAGNOSIS — E875 Hyperkalemia: Secondary | ICD-10-CM

## 2013-05-16 LAB — BASIC METABOLIC PANEL
BUN: 24 mg/dL — ABNORMAL HIGH (ref 6–23)
CO2: 26 mEq/L (ref 19–32)
Calcium: 9 mg/dL (ref 8.4–10.5)
Chloride: 102 mEq/L (ref 96–112)
Creatinine, Ser: 1.4 mg/dL — ABNORMAL HIGH (ref 0.4–1.2)
GFR: 39.53 mL/min — ABNORMAL LOW (ref >60.00)
Glucose, Bld: 271 mg/dL — ABNORMAL HIGH (ref 70–99)
Potassium: 4.6 mEq/L (ref 3.5–5.1)
Sodium: 136 mEq/L (ref 135–145)

## 2013-05-17 ENCOUNTER — Other Ambulatory Visit: Payer: Self-pay | Admitting: Family Medicine

## 2013-05-17 DIAGNOSIS — Z9181 History of falling: Secondary | ICD-10-CM | POA: Diagnosis not present

## 2013-05-17 DIAGNOSIS — S8290XD Unspecified fracture of unspecified lower leg, subsequent encounter for closed fracture with routine healing: Secondary | ICD-10-CM | POA: Diagnosis not present

## 2013-05-17 DIAGNOSIS — Z933 Colostomy status: Secondary | ICD-10-CM | POA: Diagnosis not present

## 2013-05-17 DIAGNOSIS — E119 Type 2 diabetes mellitus without complications: Secondary | ICD-10-CM | POA: Diagnosis not present

## 2013-05-17 DIAGNOSIS — IMO0001 Reserved for inherently not codable concepts without codable children: Secondary | ICD-10-CM | POA: Diagnosis not present

## 2013-05-22 ENCOUNTER — Telehealth: Payer: Self-pay

## 2013-05-22 DIAGNOSIS — S8290XD Unspecified fracture of unspecified lower leg, subsequent encounter for closed fracture with routine healing: Secondary | ICD-10-CM | POA: Diagnosis not present

## 2013-05-22 DIAGNOSIS — IMO0001 Reserved for inherently not codable concepts without codable children: Secondary | ICD-10-CM | POA: Diagnosis not present

## 2013-05-22 DIAGNOSIS — Z933 Colostomy status: Secondary | ICD-10-CM | POA: Diagnosis not present

## 2013-05-22 DIAGNOSIS — E119 Type 2 diabetes mellitus without complications: Secondary | ICD-10-CM | POA: Diagnosis not present

## 2013-05-22 DIAGNOSIS — Z9181 History of falling: Secondary | ICD-10-CM | POA: Diagnosis not present

## 2013-05-22 MED ORDER — BENAZEPRIL HCL 10 MG PO TABS: 5.0000 mg | ORAL_TABLET | Freq: Every day | ORAL | Status: DC

## 2013-05-22 NOTE — Telephone Encounter (Signed)
Would start back with 1/2 tab for now.  Report back with BP in about 5-7 days, sooner if needed.

## 2013-05-22 NOTE — Telephone Encounter (Signed)
Pt called BP 163/77 this AM; 05/17/13 BP was 163/75; on 05/15/13 BP 159/76; and 05/10/13 BP 144/64; these BP were taken by home health nurse. Pt wants to know if Dr Para March wants to restart Benazepril [facility]; pt does not need refill now; has med at home. Pt not having any symptoms; no h/a, dizziness, SOB or CP.Please advise.

## 2013-05-22 NOTE — Telephone Encounter (Signed)
Patient advised.

## 2013-05-23 DIAGNOSIS — S37019A Minor contusion of unspecified kidney, initial encounter: Secondary | ICD-10-CM | POA: Diagnosis not present

## 2013-05-24 DIAGNOSIS — E119 Type 2 diabetes mellitus without complications: Secondary | ICD-10-CM

## 2013-05-24 DIAGNOSIS — Z9181 History of falling: Secondary | ICD-10-CM | POA: Diagnosis not present

## 2013-05-24 DIAGNOSIS — S8290XD Unspecified fracture of unspecified lower leg, subsequent encounter for closed fracture with routine healing: Secondary | ICD-10-CM

## 2013-05-24 DIAGNOSIS — Z933 Colostomy status: Secondary | ICD-10-CM

## 2013-05-24 DIAGNOSIS — IMO0001 Reserved for inherently not codable concepts without codable children: Secondary | ICD-10-CM | POA: Diagnosis not present

## 2013-05-25 DIAGNOSIS — IMO0001 Reserved for inherently not codable concepts without codable children: Secondary | ICD-10-CM | POA: Diagnosis not present

## 2013-05-25 DIAGNOSIS — Z9181 History of falling: Secondary | ICD-10-CM | POA: Diagnosis not present

## 2013-05-25 DIAGNOSIS — S8290XD Unspecified fracture of unspecified lower leg, subsequent encounter for closed fracture with routine healing: Secondary | ICD-10-CM | POA: Diagnosis not present

## 2013-05-25 DIAGNOSIS — Z933 Colostomy status: Secondary | ICD-10-CM | POA: Diagnosis not present

## 2013-05-25 DIAGNOSIS — E119 Type 2 diabetes mellitus without complications: Secondary | ICD-10-CM | POA: Diagnosis not present

## 2013-05-26 ENCOUNTER — Other Ambulatory Visit: Payer: Self-pay | Admitting: *Deleted

## 2013-05-26 MED ORDER — DILTIAZEM HCL 90 MG PO TABS: 90.0000 mg | ORAL_TABLET | Freq: Every day | ORAL | Status: DC

## 2013-05-26 NOTE — Telephone Encounter (Signed)
Rx was sent to pharmacy electronically. 

## 2013-05-30 ENCOUNTER — Telehealth: Payer: Self-pay | Admitting: *Deleted

## 2013-05-30 NOTE — Telephone Encounter (Signed)
Patient advised.

## 2013-05-30 NOTE — Telephone Encounter (Signed)
I would continue as is with the BP medicine.  If the chest sx continue, then we should have her evaluated in the clinic.

## 2013-05-30 NOTE — Telephone Encounter (Signed)
Patient reports BP readings as requested:   05/23/13 131/64 8/20  140/72 8/21  148/74 8/22  133/72 8/25  147/64 8/26  144/77 Patient says she also feels like she has a little bit of congestion today (wheezing).

## 2013-06-01 DIAGNOSIS — M25549 Pain in joints of unspecified hand: Secondary | ICD-10-CM | POA: Diagnosis not present

## 2013-06-01 DIAGNOSIS — M25569 Pain in unspecified knee: Secondary | ICD-10-CM | POA: Diagnosis not present

## 2013-06-02 DIAGNOSIS — S8290XD Unspecified fracture of unspecified lower leg, subsequent encounter for closed fracture with routine healing: Secondary | ICD-10-CM | POA: Diagnosis not present

## 2013-06-02 DIAGNOSIS — Z9181 History of falling: Secondary | ICD-10-CM | POA: Diagnosis not present

## 2013-06-02 DIAGNOSIS — Z933 Colostomy status: Secondary | ICD-10-CM | POA: Diagnosis not present

## 2013-06-02 DIAGNOSIS — IMO0001 Reserved for inherently not codable concepts without codable children: Secondary | ICD-10-CM | POA: Diagnosis not present

## 2013-06-02 DIAGNOSIS — E119 Type 2 diabetes mellitus without complications: Secondary | ICD-10-CM | POA: Diagnosis not present

## 2013-06-15 DIAGNOSIS — M25569 Pain in unspecified knee: Secondary | ICD-10-CM | POA: Diagnosis not present

## 2013-06-29 DIAGNOSIS — M25569 Pain in unspecified knee: Secondary | ICD-10-CM | POA: Diagnosis not present

## 2013-07-06 ENCOUNTER — Other Ambulatory Visit: Payer: Self-pay | Admitting: Family Medicine

## 2013-07-18 ENCOUNTER — Other Ambulatory Visit: Payer: Self-pay | Admitting: Family Medicine

## 2013-07-18 DIAGNOSIS — S37019A Minor contusion of unspecified kidney, initial encounter: Secondary | ICD-10-CM | POA: Diagnosis not present

## 2013-07-18 DIAGNOSIS — K8020 Calculus of gallbladder without cholecystitis without obstruction: Secondary | ICD-10-CM | POA: Diagnosis not present

## 2013-07-19 ENCOUNTER — Ambulatory Visit (INDEPENDENT_AMBULATORY_CARE_PROVIDER_SITE_OTHER): Payer: Medicare Other

## 2013-07-19 DIAGNOSIS — Z23 Encounter for immunization: Secondary | ICD-10-CM

## 2013-08-16 ENCOUNTER — Other Ambulatory Visit (INDEPENDENT_AMBULATORY_CARE_PROVIDER_SITE_OTHER): Payer: Medicare Other

## 2013-08-16 DIAGNOSIS — E119 Type 2 diabetes mellitus without complications: Secondary | ICD-10-CM | POA: Diagnosis not present

## 2013-08-23 ENCOUNTER — Ambulatory Visit (INDEPENDENT_AMBULATORY_CARE_PROVIDER_SITE_OTHER): Payer: Medicare Other | Admitting: Family Medicine

## 2013-08-23 ENCOUNTER — Encounter: Payer: Self-pay | Admitting: Family Medicine

## 2013-08-23 VITALS — BP 124/70 | HR 72 | Temp 98.0°F | Ht 65.0 in | Wt 155.0 lb

## 2013-08-23 DIAGNOSIS — L989 Disorder of the skin and subcutaneous tissue, unspecified: Secondary | ICD-10-CM | POA: Diagnosis not present

## 2013-08-23 DIAGNOSIS — Z9181 History of falling: Secondary | ICD-10-CM | POA: Diagnosis not present

## 2013-08-23 DIAGNOSIS — I798 Other disorders of arteries, arterioles and capillaries in diseases classified elsewhere: Secondary | ICD-10-CM | POA: Diagnosis not present

## 2013-08-23 DIAGNOSIS — E1159 Type 2 diabetes mellitus with other circulatory complications: Secondary | ICD-10-CM

## 2013-08-23 NOTE — Patient Instructions (Signed)
Recheck in about 4 months with labs ahead of time. Take care.  Glad to see you.

## 2013-08-23 NOTE — Progress Notes (Signed)
Discussed fall precautions.  She has made some modifications at home.   No more falls.  We talked about not getting in a hurry.  She is using a rail with stairs.  Advised to keep working on exercises for leg strength.    She does have some nocturnal L knee pain. Sitting up with the leg over the side of the bed seems to help.  That was the leg with the fracture.    She has f/u with cards tomorrow.    Skin lesion noted on R side of face.  Can get irritated per patient report.   Diabetes:  Using medications without difficulties: yes Hypoglycemic episodes: about 2x/month Hyperglycemic episodes:see below Feet problems:no Blood Sugars averaging: usually ~100 in AM, occ higher later in the day.  eye exam within last year:yes A1c 8.3.  Labs d/w pt.    Meds, vitals, and allergies reviewed.   ROS: See HPI.  Otherwise negative.    GEN: nad, alert and oriented HEENT: mucous membranes moist NECK: supple w/o LA CV: sounds to be rrr. PULM: ctab, no inc wob ABD: soft, +bs EXT: trace edema in feet SKIN: no acute rash Small irritated SK vs wart on R cheek

## 2013-08-24 ENCOUNTER — Ambulatory Visit (INDEPENDENT_AMBULATORY_CARE_PROVIDER_SITE_OTHER): Payer: Medicare Other | Admitting: Cardiovascular Disease

## 2013-08-24 ENCOUNTER — Encounter: Payer: Self-pay | Admitting: Cardiovascular Disease

## 2013-08-24 VITALS — BP 150/80 | HR 61 | Resp 16 | Ht 65.0 in | Wt 155.0 lb

## 2013-08-24 DIAGNOSIS — I4891 Unspecified atrial fibrillation: Secondary | ICD-10-CM | POA: Diagnosis not present

## 2013-08-24 DIAGNOSIS — I2510 Atherosclerotic heart disease of native coronary artery without angina pectoris: Secondary | ICD-10-CM | POA: Diagnosis not present

## 2013-08-24 DIAGNOSIS — E785 Hyperlipidemia, unspecified: Secondary | ICD-10-CM

## 2013-08-24 DIAGNOSIS — I255 Ischemic cardiomyopathy: Secondary | ICD-10-CM | POA: Insufficient documentation

## 2013-08-24 DIAGNOSIS — L989 Disorder of the skin and subcutaneous tissue, unspecified: Secondary | ICD-10-CM | POA: Insufficient documentation

## 2013-08-24 DIAGNOSIS — I739 Peripheral vascular disease, unspecified: Secondary | ICD-10-CM

## 2013-08-24 DIAGNOSIS — E119 Type 2 diabetes mellitus without complications: Secondary | ICD-10-CM

## 2013-08-24 DIAGNOSIS — I10 Essential (primary) hypertension: Secondary | ICD-10-CM | POA: Diagnosis not present

## 2013-08-24 DIAGNOSIS — Z9181 History of falling: Secondary | ICD-10-CM | POA: Insufficient documentation

## 2013-08-24 DIAGNOSIS — I2589 Other forms of chronic ischemic heart disease: Secondary | ICD-10-CM

## 2013-08-24 NOTE — Assessment & Plan Note (Addendum)
Despite moderately depressed left ventricular systolic function she does not have overt congestive heart failure (NYHA functional class 1-2) and has not required diuretic therapy. She appears to be clinically euvolemic. Sotalol is providing her beta blocker therapy, she is receiving ACE inhibitor and an acceptable dose. At risk for hypotension and falls, her ACE inhibitor dose is not increased. She is tolerating therapy with full dose Actos. If she were to develop symptoms of congestive heart failure, I would not hesitate to discontinue the Actos

## 2013-08-24 NOTE — Assessment & Plan Note (Signed)
D/w pt about fall risk and precautions.

## 2013-08-24 NOTE — Assessment & Plan Note (Signed)
Blood pressure slightly elevated today, but just yesterday her blood pressure is 124/70. No changes to her medications. She is actually taking benazepril 10 mg daily and O2 continue on the maximum tolerated dose of ACE inhibitor since she has significant cardiomyopathy.

## 2013-08-24 NOTE — Assessment & Plan Note (Signed)
Small (a few mm across) irritated SK vs wart on R cheek, treated x3 with liq N2, no complications.  Routine post freeze instructions given.

## 2013-08-24 NOTE — Progress Notes (Signed)
Patient ID: [name], female   DOB: [birth year], 78 y.o.   MRN: [id number]     Reason for office visit Followup atrial fibrillation, CAD, ischemic cardiomyopathy  Since her last appointment in May Mrs. Harnack has not had new cardiac events but has had one more serious fall complicated by a left leg fracture and extensive soft tissue injury including a scalp/periorbital hematoma. She says that she tripped over a curb, she did not lose consciousness. She did not have is of intracranial bleeding. She did require immobilization of her left leg. She continues have some discomfort in her left knee that sometimes wakes her up at night.  She does not have problems with chest pain either at rest or with exertion and has at most mild shortness of breath on exertion, definitely not worse than before. She denies any palpitations or other overt symptoms of atrial fibrillation. She has not had problems with lower extremity edema or shortness of breath at rest.   Allergies  Allergen Reactions  . Metformin And Related     Held due to renal function  . Zocor [Simvastatin] Other (See Comments)    Myalgias    Current Outpatient Prescriptions  Medication Sig Dispense Refill  . aspirin EC 81 MG tablet Take 81 mg by mouth daily.      Marland Kitchen atorvastatin (LIPITOR) 20 MG tablet Take 20 mg by mouth at bedtime.        . BD INSULIN SYRINGE ULTRAFINE 31G X 5/16" 0.3 ML MISC USE DAILY AND AS DIRECTED SLIDING SCALE  100 each  3  . benazepril (LOTENSIN) 10 MG tablet Take 0.5 tablets (5 mg total) by mouth daily.      . Cholecalciferol (VITAMIN D) 1000 UNITS capsule Take 1,000 Units by mouth 2 (two) times daily.        Marland Kitchen ezetimibe (ZETIA) 10 MG tablet Take 1 tablet (10 mg total) by mouth every other day.  28 tablet  0  . glyBURIDE (DIABETA) 5 MG tablet TAKE 2 TABLETS (10 MG TOTAL) BY MOUTH DAILY WITH BREAKFAST.  180 tablet  3  . insulin aspart (NOVOLOG) 100 UNIT/ML injection Use as directed per sliding scale: 200-250 = 4-5  Units, 251-300 = 5-6 Units, 301-350 = 6-7 Units, with meals  1 vial  PRN  . meclizine (ANTIVERT) 25 MG tablet Take 25 mg by mouth daily as needed. For dizziness      . Multiple Vitamin (MULTIVITAMIN) tablet Take 1 tablet by mouth daily.      . nitroGLYCERIN (NITROSTAT) 0.4 MG SL tablet Place 0.4 mg under the tongue as needed. As directed      . Omega-3 Fatty Acids (FISH OIL) 1000 MG CAPS Take 1 capsule by mouth daily.       . ONE TOUCH ULTRA TEST test strip USE AS DIRECTED TO CHECK BLOOD SUGAR 4 TIMES A DAY PER SLIDING SCALE  100 each  5  . pioglitazone (ACTOS) 45 MG tablet TAKE 1 TABLET BY MOUTH DAILY.  90 tablet  3  . RANEXA 500 MG 12 hr tablet Take 500 mg by mouth 2 (two) times daily.       . sotalol (BETAPACE) 80 MG tablet Take 1/2  twice daily by mouth       . vitamin B-12 (CYANOCOBALAMIN) 1000 MCG tablet Take 1,000 mcg by mouth daily.        No current facility-administered medications for this visit.    Past Medical History  Diagnosis Date  . Anemia,  iron deficiency   . History of breast cancer     Followed yearly by [facility]  . Coronary artery disease   . Diabetes mellitus, type 2   . Hyperlipemia   . Hypertension   . Osteoporosis   . Peripheral vascular disease   . 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 Diverticulitis   . Atrial fibrillation 11/28/2010    2D Echo - EF-30-35, left atrium moderate to severely dilated, right ventricle moderately dilated, tricuspid valve mild-moderate regurgitation  . Chronic kidney disease (CKD), stage II (mild)   . Nontraumatic retroperitoneal hematoma     Past Surgical History  Procedure Laterality Date  . Appendectomy  1962  . Dilation and curettage of uterus  1971     miscarrage, abnormal bleeding  . Dilation and curettage of uterus  1995    Menorrhagia  . Breast surgery  02/01    Quadrectomy Breast cancer (Dr. Daphine Deutscher)  . Coronary artery bypass graft  02/09/02    X 4 ([facility])  . Shoulder arthroscopy  05/04    Right,  with rotator cuff debridement  . Hemorrhoid surgery  09/11/04    Binding Daphine Deutscher)  . Cataract extraction  10/10 and 02/11  . Colostomy    . Partial colectomy    . Cardiac catheterization  2000    Iliac Stent (Dr. Guinevere Scarlet) yearly follow-up  . Cardiac catheterization  01/25/02    High grade LAD disease, total of 3 vess, involvement  . Cardiac catheterization  01/13/07    SV graft to diag. occluded but good backfill o/w ok  . Cardiac catheterization  02/14/10    PTCA & stent vein graft to OM/RCA ([facility])  . Cardiac catheterization  04/17/2011    Saphenous vein grafts to the RCA 100% occluded, to the OM 100% occluded, and the diagonal 100% occluded. Internal mammary artery to the LAD, widely patent.    Family History  Problem Relation Age of Onset  . Hypertension Mother   . Diabetes Mother   . Hypertension Father   . Heart disease Father 83    Heart attack  . Heart disease Brother     CHF  . Hypertension Brother   . COPD Brother     bronchial, frequent pneumonia  . Hypertension Brother   . Hyperlipidemia Brother   . Heart disease Brother     CABG  . Hypertension Brother   . Gout Brother   . Obesity Brother   . Diabetes Son   . Hypertension Son     History   Social History  . Marital Status: Married    Spouse Name: N/A    Number of Children: 2  . Years of Education: N/A   Occupational History  . Retired    Social History Main Topics  . Smoking status: Never Smoker   . Smokeless tobacco: Never Used  . Alcohol Use: No  . Drug Use: No  . Sexual Activity: Not on file   Other Topics Concern  . Not on file   Social History Narrative   Retired 1991 from [employer] work   Widowed as of 04/2011 after 50+ years   Lives alone   2 kids in [place]   Enjoys travelling and reading    Review of systems: The patient specifically denies any chest pain at rest or with exertion, dyspnea at rest or with usual exertion, orthopnea, paroxysmal nocturnal dyspnea,  syncope, palpitations, focal neurological deficits, intermittent claudication, lower extremity  edema, unexplained weight gain, cough, hemoptysis or wheezing.  The patient also denies abdominal pain, nausea, vomiting, dysphagia, diarrhea, constipation, polyuria, polydipsia, dysuria, hematuria, frequency, urgency, abnormal bleeding or bruising, fever, chills, unexpected weight changes, mood swings, change in skin or hair texture, change in voice quality, auditory or visual problems, allergic reactions or rashes, new musculoskeletal complaints other than usual "aches and pains".   PHYSICAL EXAM BP 150/80  Pulse 61  Resp 16  Ht 5\' 5"  (1.651 m)  Wt 155 lb (70.308 kg)  BMI 25.79 kg/m2 General: Alert, oriented x3, no distress  Head: no evidence of trauma, PERRL, EOMI, no exophtalmos or lid lag, no myxedema, no xanthelasma; normal ears, nose and oropharynx  Neck: normal jugular venous pulsations and no hepatojugular reflux; brisk carotid pulses without delay and no carotid bruits  Chest: clear to auscultation, no signs of consolidation by percussion or palpation, normal fremitus, symmetrical and full respiratory excursions. Well-healed sternotomy scar  Cardiovascular: normal position and quality of the apical impulse, regular rhythm, normal first and second heart sounds, no rubs or gallops. Grade 1/6 soft systolic murmur at the apex with limited radiation to axilla  Abdomen: no tenderness or distention, no masses by palpation, no abnormal pulsatility or arterial bruits, normal bowel sounds, no hepatosplenomegaly  Extremities: no clubbing, cyanosis or edema; 2+ radial, ulnar and brachial pulses bilaterally; 2+ right femoral, 1+ posterior tibial and dorsalis pedis pulses; 2+ left femoral, 1+ posterior tibial and dorsalis pedis pulses; no subclavian or femoral bruits  Prominent ecchymosis of left upper forearm, minimally tender  Neurological: grossly nonfocal    EKG: Sinus rhythm, probable old inferior  infarction, poor R-wave progression, normal QT interval at 440 ms.  Lipid Panel     Component Value Date/Time   CHOL 146 02/16/2013 1229   TRIG 137 02/16/2013 1229   HDL 43 02/16/2013 1229   CHOLHDL 3.4 02/16/2013 1229   VLDL 27 02/16/2013 1229   LDLCALC 76 02/16/2013 1229    BMET    Component Value Date/Time   NA 136 05/16/2013 1015   K 4.6 05/16/2013 1015   CL 102 05/16/2013 1015   CO2 26 05/16/2013 1015   GLUCOSE 271* 05/16/2013 1015   BUN 24* 05/16/2013 1015   CREATININE 1.4* 05/16/2013 1015   CREATININE 1.30* 02/16/2013 1229   CALCIUM 9.0 05/16/2013 1015   GFRNONAA 40* 10/11/2012 0720   GFRAA 46* 10/11/2012 0720     ASSESSMENT AND PLAN HYPERLIPIDEMIA Recent lipid profile shows satisfactory levels. Continue combination statin-Zetia therapy  HYPERTENSION Blood pressure slightly elevated today, but just yesterday her blood pressure is 124/70. No changes to her medications. She is actually taking benazepril 10 mg daily and O2 continue on the maximum tolerated dose of ACE inhibitor since she has significant cardiomyopathy.  Atrial fibrillation No clinical recurrence recently. Her QTC interval is acceptable. The potential interaction between sotalol and ranolazine is acknowledged but does not appear to be a problem in her case. Discussed the need to avoid QT prolonging agents. I see no purpose for her to continue once daily immediate release diltiazem I have asked her to stop this medication. Diltiazem may be useful for episodes of symptomatic atrial fibrillation rapid ventricular response, but it is preferable not to use it in this patient with depressed left ventricular ejection fraction. She is not taking anticoagulants due to history of retroperitoneal hematoma while taking aspirin and Plavix. Her recent serious falls and injuries reinforce the need to void full anticoagulation.  Coronary artery disease Asymptomatic. ranexa  has been of great benefit in protecting her from angina.    Cardiomyopathy, ischemic Despite moderately depressed left ventricular systolic function she does not have overt congestive heart failure (NYHA functional class 1-2) and has not required diuretic therapy. She appears to be clinically euvolemic. Sotalol is providing her beta blocker therapy, she is receiving ACE inhibitor and an acceptable dose. At risk for hypotension and falls, her ACE inhibitor dose is not increased. She is tolerating therapy with full dose Actos. If she were to develop symptoms of congestive heart failure, I would not hesitate to discontinue the Actos  Diabetes mellitus, type 2 Control is mediocre, but she has been plagued with frequent episodes of hypoglycemia.  PERIPHERAL VASCULAR DISEASE She does not have symptoms of intermittent claudication. Her left knee discomfort is convincingly arthralgia and not claudication. Note however that by previous Doppler ultrasonography she has significant PAD and has had previous iliac stent.  Orders Placed This Encounter  Procedures  . EKG 12-Lead   No orders of the defined types were placed in this encounter.    Junious Silk, MD, [facility] [phone] office [phone] pager

## 2013-08-24 NOTE — Assessment & Plan Note (Signed)
Reasonable control, would continue as is.  Would prefer for A1c to be slightly up and not have may low sugars, instead of lower A1c and more lows at night.  She agrees.  Labs d/w pt.  Recheck in a few months.

## 2013-08-24 NOTE — Assessment & Plan Note (Signed)
No clinical recurrence recently. Her QTC interval is acceptable. The potential interaction between sotalol and ranolazine is acknowledged but does not appear to be a problem in her case. Discussed the need to avoid QT prolonging agents. I see no purpose for her to continue once daily immediate release diltiazem I have asked her to stop this medication. Diltiazem may be useful for episodes of symptomatic atrial fibrillation rapid ventricular response, but it is preferable not to use it in this patient with depressed left ventricular ejection fraction. She is not taking anticoagulants due to history of retroperitoneal hematoma while taking aspirin and Plavix. Her recent serious falls and injuries reinforce the need to void full anticoagulation.

## 2013-08-24 NOTE — Assessment & Plan Note (Signed)
Recent lipid profile shows satisfactory levels. Continue combination statin-Zetia therapy

## 2013-08-24 NOTE — Assessment & Plan Note (Signed)
Asymptomatic. ranexa has been of great benefit in protecting her from angina.

## 2013-08-24 NOTE — Assessment & Plan Note (Signed)
Control is mediocre, but she has been plagued with frequent episodes of hypoglycemia.

## 2013-08-24 NOTE — Patient Instructions (Signed)
STOP Diltiazem/Cardizem.  Your physician recommends that you schedule a follow-up appointment in: 6 months.

## 2013-08-24 NOTE — Assessment & Plan Note (Signed)
He does not have symptoms of intermittent claudication. Her left knee discomfort is convincingly arthralgia and not claudication. Note however that by previous Doppler ultrasonography she has significant PAD and has had previous iliac stent.

## 2013-08-25 ENCOUNTER — Other Ambulatory Visit: Payer: Self-pay | Admitting: *Deleted

## 2013-08-25 ENCOUNTER — Encounter: Payer: Self-pay | Admitting: Cardiovascular Disease

## 2013-08-25 MED ORDER — MECLIZINE HCL 25 MG PO TABS: 25.0000 mg | ORAL_TABLET | Freq: Every day | ORAL | Status: DC | PRN

## 2013-08-25 NOTE — Telephone Encounter (Signed)
Rx was sent to pharmacy electronically. 

## 2013-09-04 ENCOUNTER — Other Ambulatory Visit: Payer: Self-pay | Admitting: Cardiology

## 2013-09-04 NOTE — Telephone Encounter (Signed)
Rx was sent to pharmacy electronically. 

## 2013-09-10 ENCOUNTER — Emergency Department (HOSPITAL_COMMUNITY)
Admission: EM | Admit: 2013-09-10 | Discharge: 2013-09-10 | Disposition: A | Discharge status: Home / Self Care | Payer: Medicare Other | Attending: Emergency Medicine | Admitting: Emergency Medicine

## 2013-09-10 ENCOUNTER — Emergency Department (HOSPITAL_COMMUNITY): Payer: Medicare Other

## 2013-09-10 DIAGNOSIS — M810 Age-related osteoporosis without current pathological fracture: Secondary | ICD-10-CM | POA: Insufficient documentation

## 2013-09-10 DIAGNOSIS — N182 Chronic kidney disease, stage 2 (mild): Secondary | ICD-10-CM | POA: Insufficient documentation

## 2013-09-10 DIAGNOSIS — Z8719 Personal history of other diseases of the digestive system: Secondary | ICD-10-CM | POA: Insufficient documentation

## 2013-09-10 DIAGNOSIS — Z9861 Coronary angioplasty status: Secondary | ICD-10-CM | POA: Insufficient documentation

## 2013-09-10 DIAGNOSIS — Y9389 Activity, other specified: Secondary | ICD-10-CM | POA: Insufficient documentation

## 2013-09-10 DIAGNOSIS — W010XXA Fall on same level from slipping, tripping and stumbling without subsequent striking against object, initial encounter: Secondary | ICD-10-CM

## 2013-09-10 DIAGNOSIS — Z7982 Long term (current) use of aspirin: Secondary | ICD-10-CM | POA: Insufficient documentation

## 2013-09-10 DIAGNOSIS — E119 Type 2 diabetes mellitus without complications: Secondary | ICD-10-CM | POA: Diagnosis not present

## 2013-09-10 DIAGNOSIS — S32509A Unspecified fracture of unspecified pubis, initial encounter for closed fracture: Secondary | ICD-10-CM | POA: Insufficient documentation

## 2013-09-10 DIAGNOSIS — I129 Hypertensive chronic kidney disease with stage 1 through stage 4 chronic kidney disease, or unspecified chronic kidney disease: Secondary | ICD-10-CM | POA: Insufficient documentation

## 2013-09-10 DIAGNOSIS — Z794 Long term (current) use of insulin: Secondary | ICD-10-CM | POA: Insufficient documentation

## 2013-09-10 DIAGNOSIS — Z951 Presence of aortocoronary bypass graft: Secondary | ICD-10-CM | POA: Insufficient documentation

## 2013-09-10 DIAGNOSIS — I2510 Atherosclerotic heart disease of native coronary artery without angina pectoris: Secondary | ICD-10-CM | POA: Insufficient documentation

## 2013-09-10 DIAGNOSIS — S32591A Other specified fracture of right pubis, initial encounter for closed fracture: Secondary | ICD-10-CM

## 2013-09-10 DIAGNOSIS — D509 Iron deficiency anemia, unspecified: Secondary | ICD-10-CM | POA: Diagnosis not present

## 2013-09-10 DIAGNOSIS — Z79899 Other long term (current) drug therapy: Secondary | ICD-10-CM | POA: Diagnosis not present

## 2013-09-10 DIAGNOSIS — E785 Hyperlipidemia, unspecified: Secondary | ICD-10-CM | POA: Diagnosis not present

## 2013-09-10 DIAGNOSIS — Z853 Personal history of malignant neoplasm of breast: Secondary | ICD-10-CM | POA: Diagnosis not present

## 2013-09-10 DIAGNOSIS — Y9289 Other specified places as the place of occurrence of the external cause: Secondary | ICD-10-CM | POA: Insufficient documentation

## 2013-09-10 MED ORDER — TRAMADOL HCL 50 MG PO TABS
50.0000 mg | ORAL_TABLET | Freq: Once | ORAL | Status: AC
  Administered 2013-09-10: 50 mg via ORAL
  Filled 2013-09-10: qty 1

## 2013-09-10 MED ORDER — TRAMADOL HCL 50 MG PO TABS: 50.0000 mg | ORAL_TABLET | Freq: Four times a day (QID) | ORAL | Status: DC | PRN

## 2013-09-10 NOTE — ED Notes (Signed)
[name], Case Manager spoke with patient and will set up PT evaluation with [facility] for pt tomorrow am and will call pt with the information.

## 2013-09-10 NOTE — ED Notes (Addendum)
Pt reports fall today around 1400. Pt describes mechanical fall and is now c/o R sided hip pain. Pt has difficulty bearing weight on R leg. Pt had to be helped in order to transfer from wheelchair to bed. Pt alerx4. NAD. Pt denies hitting head or LOC during fall.

## 2013-09-10 NOTE — ED Provider Notes (Signed)
CSN: [id number]     Arrival date & time 09/10/13  1858 History   First MD Initiated Contact with Patient 09/10/13 1910     Chief Complaint  Patient presents with  . Hip Pain   (Consider location/radiation/quality/duration/timing/severity/associated sxs/prior Treatment) Patient is a 78 y.o. female presenting with hip pain. The history is provided by the patient and a relative.  Hip Pain Pertinent negatives include no chest pain, no abdominal pain, no headaches and no shortness of breath.  s/p mechanical fall at 1400 today, c/o right hip pain especially when bearing weight or walking. Pt states was at [place], went to turn around, and stepped in mild depression in ground causing her to fall. No faintness or dizziness prior to fall, felt at baseline well just prior to fall. No loc. No head injury or headache. No nv. No neck or back pain. Has been ambulatory post fall but with right hip pain. Dull, moderate, non radiating. No leg numbness/weakness. Denies any other injury.    Past Medical History  Diagnosis Date  . Anemia, iron deficiency   . History of breast cancer     Followed yearly by [facility]  . Coronary artery disease   . Diabetes mellitus, type 2   . Hyperlipemia   . Hypertension   . Osteoporosis   . Peripheral vascular disease   . 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 Diverticulitis   . Atrial fibrillation 11/28/2010    2D Echo - EF-30-35, left atrium moderate to severely dilated, right ventricle moderately dilated, tricuspid valve mild-moderate regurgitation  . Chronic kidney disease (CKD), stage II (mild)   . Nontraumatic retroperitoneal hematoma    Past Surgical History  Procedure Laterality Date  . Appendectomy  1962  . Dilation and curettage of uterus  1971     miscarrage, abnormal bleeding  . Dilation and curettage of uterus  1995    Menorrhagia  . Breast surgery  02/01    Quadrectomy Breast cancer (Dr. Daphine Deutscher)  . Coronary artery bypass graft  02/09/02     X 4 ([facility])  . Shoulder arthroscopy  05/04    Right, with rotator cuff debridement  . Hemorrhoid surgery  09/11/04    Binding Daphine Deutscher)  . Cataract extraction  10/10 and 02/11  . Colostomy    . Partial colectomy    . Cardiac catheterization  2000    Iliac Stent (Dr. Guinevere Scarlet) yearly follow-up  . Cardiac catheterization  01/25/02    High grade LAD disease, total of 3 vess, involvement  . Cardiac catheterization  01/13/07    SV graft to diag. occluded but good backfill o/w ok  . Cardiac catheterization  02/14/10    PTCA & stent vein graft to OM/RCA ([facility])  . Cardiac catheterization  04/17/2011    Saphenous vein grafts to the RCA 100% occluded, to the OM 100% occluded, and the diagonal 100% occluded. Internal mammary artery to the LAD, widely patent.   Family History  Problem Relation Age of Onset  . Hypertension Mother   . Diabetes Mother   . Hypertension Father   . Heart disease Father 19    Heart attack  . Heart disease Brother     CHF  . Hypertension Brother   . COPD Brother     bronchial, frequent pneumonia  . Hypertension Brother   . Hyperlipidemia Brother   . Heart disease Brother     CABG  . Hypertension Brother   . Gout  Brother   . Obesity Brother   . Diabetes Son   . Hypertension Son    History  Substance Use Topics  . Smoking status: Never Smoker   . Smokeless tobacco: Never Used  . Alcohol Use: No   OB History   Grav Para Term Preterm Abortions TAB SAB Ect Mult Living                 Review of Systems  Constitutional: Negative for fever and chills.  HENT: Negative for nosebleeds.   Eyes: Negative for visual disturbance.  Respiratory: Negative for shortness of breath.   Cardiovascular: Negative for chest pain and palpitations.  Gastrointestinal: Negative for nausea, vomiting and abdominal pain.  Genitourinary: Negative for flank pain.  Musculoskeletal: Negative for back pain and neck pain.  Skin: Negative for wound.  Neurological: Negative  for weakness, numbness and headaches.  Hematological: Does not bruise/bleed easily.  Psychiatric/Behavioral: Negative for confusion.    Allergies  Metformin and related and Zocor  Home Medications   Current Outpatient Rx  Name  Route  Sig  Dispense  Refill  . aspirin EC 81 MG tablet   Oral   Take 81 mg by mouth daily.         Marland Kitchen atorvastatin (LIPITOR) 20 MG tablet   Oral   Take 20 mg by mouth at bedtime.           . BD INSULIN SYRINGE ULTRAFINE 31G X 5/16" 0.3 ML MISC      USE DAILY AND AS DIRECTED SLIDING SCALE   100 each   3   . benazepril (LOTENSIN) 10 MG tablet   Oral   Take 0.5 tablets (5 mg total) by mouth daily.   30 tablet   3   . Cholecalciferol (VITAMIN D) 1000 UNITS capsule   Oral   Take 1,000 Units by mouth 2 (two) times daily.           Marland Kitchen ezetimibe (ZETIA) 10 MG tablet   Oral   Take 1 tablet (10 mg total) by mouth every other day.   28 tablet   0     Zetia 10 mg (1) sample packet-7 tablets LOT: [id number] ...   . glyBURIDE (DIABETA) 5 MG tablet      TAKE 2 TABLETS (10 MG TOTAL) BY MOUTH DAILY WITH BREAKFAST.   180 tablet   3   . insulin aspart (NOVOLOG) 100 UNIT/ML injection      Use as directed per sliding scale: 200-250 = 4-5 Units, 251-300 = 5-6 Units, 301-350 = 6-7 Units, with meals   1 vial   PRN   . meclizine (ANTIVERT) 25 MG tablet   Oral   Take 1 tablet (25 mg total) by mouth daily as needed. For dizziness   30 tablet   6   . Multiple Vitamin (MULTIVITAMIN) tablet   Oral   Take 1 tablet by mouth daily.         . nitroGLYCERIN (NITROSTAT) 0.4 MG SL tablet   Sublingual   Place 0.4 mg under the tongue as needed for chest pain. As directed         . Omega-3 Fatty Acids (FISH OIL) 1000 MG CAPS   Oral   Take 1 capsule by mouth daily.          . ONE TOUCH ULTRA TEST test strip      USE AS DIRECTED TO CHECK BLOOD SUGAR 4 TIMES A DAY PER SLIDING SCALE   100  each   5   . pioglitazone (ACTOS) 45 MG tablet      TAKE 1  TABLET BY MOUTH DAILY.   90 tablet   3   . RANEXA 500 MG 12 hr tablet   Oral   Take 500 mg by mouth 2 (two) times daily.          . sotalol (BETAPACE) 80 MG tablet   Oral   Take 40 mg by mouth 2 (two) times daily.          . vitamin B-12 (CYANOCOBALAMIN) 1000 MCG tablet   Oral   Take 1,000 mcg by mouth daily.           BP 156/63  Pulse 71  Temp(Src) 98.5 F (36.9 C) (Oral)  Resp 16  Ht 5\' 5"  (1.651 m)  Wt 153 lb (69.4 kg)  BMI 25.46 kg/m2  SpO2 96% Physical Exam  Nursing note and vitals reviewed. Constitutional: She is oriented to person, place, and time. She appears well-developed and well-nourished. No distress.  HENT:  Head: Atraumatic.  Eyes: Conjunctivae are normal. Pupils are equal, round, and reactive to light. No scleral icterus.  Neck: Normal range of motion. Neck supple. No tracheal deviation present.  Cardiovascular: Normal rate, normal heart sounds and intact distal pulses.   Pulmonary/Chest: Effort normal and breath sounds normal. No respiratory distress. She exhibits no tenderness.  Abdominal: Soft. Normal appearance. She exhibits no distension. There is no tenderness.  Musculoskeletal: Normal range of motion. She exhibits no edema and no tenderness.  Mild pain w rom right hip, otherwise no focal sts or pain w rom or palp bil extremities. Distal pulses palp.   Neurological: She is alert and oriented to person, place, and time.  Motor intact bil.   Skin: Skin is warm and dry. No rash noted. She is not diaphoretic.  Psychiatric: She has a normal mood and affect.    ED Course  Procedures (including critical care time) Labs Review Results for orders placed during the hospital encounter of 09/10/13  GLUCOSE, CAPILLARY      Result Value Range   Glucose-Capillary 239 (*) 70 - 99 mg/dL   Dg Hip Complete Right  09/10/2013   CLINICAL DATA:  Larey Seat.  Right hip pain.  EXAM: RIGHT HIP - COMPLETE 2+ VIEW  COMPARISON:  CT scan 07/18/2013  FINDINGS: Both hips  are normally located. Moderate degenerative changes but no acute fracture or evidence of avascular necrosis. The pubic symphysis and SI joints are grossly normal. Findings suspicious for inferior pubic ramus fracture on the right.  IMPRESSION: No acute hip fracture.  Moderate degenerative changes bilaterally.  Suspect inferior pubis ramus fracture on the right.   Electronically Signed   By: Loralie Champagne M.D.   On: 09/10/2013 20:53      EKG Interpretation   None       MDM   Xrays.  Reviewed nursing notes and prior charts for additional history.   Ultram po.  famly w pt.    Consulted care management re home health service, aide, pt, bed side commode, walker.    Suzi Roots, MD 09/12/13 [phone]

## 2013-09-10 NOTE — ED Notes (Signed)
Patient transported to X-ray 

## 2013-09-10 NOTE — ED Notes (Signed)
Pt reports falling and landing on right hip/leg. Denies LOC or hitting head. States that hip pain radiates to lower abdominal region when moving right hip/leg. Denies pain when lying still. Pt recently fractured her lower left leg and had the brace removed a month a half ago.

## 2013-09-12 DIAGNOSIS — Z9181 History of falling: Secondary | ICD-10-CM | POA: Diagnosis not present

## 2013-09-12 DIAGNOSIS — E119 Type 2 diabetes mellitus without complications: Secondary | ICD-10-CM | POA: Diagnosis not present

## 2013-09-12 DIAGNOSIS — Z933 Colostomy status: Secondary | ICD-10-CM | POA: Diagnosis not present

## 2013-09-12 DIAGNOSIS — IMO0001 Reserved for inherently not codable concepts without codable children: Secondary | ICD-10-CM | POA: Diagnosis not present

## 2013-09-12 DIAGNOSIS — M25559 Pain in unspecified hip: Secondary | ICD-10-CM | POA: Diagnosis not present

## 2013-09-15 NOTE — Care Management Note (Signed)
    Page 1 of 2   09/11/2013     2:39:37 PM   CARE MANAGEMENT NOTE 09/11/2013  Patient:  [name], [name]   Account Number:  [id number]  Date Initiated:  09/10/2013  Documentation initiated by:  [name]  Subjective/Objective Assessment:   Status Post fall w/ right hip pain - fractured pelvis.     Action/Plan:   Home Health PT eval and treat.   Anticipated DC Date:  09/10/2013   Anticipated DC Plan:  HOME W HOME HEALTH SERVICES      DC Planning Services  CM consult      Choice offered to / List presented to:  C-1 Patient        HH arranged  HH-2 PT      [facility] agency  [facility]   Status of service:  Completed, signed off  Discharge Disposition:  HOME W HOME HEALTH SERVICES  Comments:  09/11/13  1400p - Spoke w/ Baxter Hire at [facility] and they did have the pt on service in July/August.  I gave the referral to [facility] and the pt will be seen tomorrow 12/9.  I called the pt and verified that [facility] was the [facility] agency that she used earlier this year and that [facility] will be calling her and she should be seen tomorrow.  Pt stated that she was doing some better today. Encouraged to call if she had any questions.  Hessie Diener   [phone]   09/11/13  735a  Late Entry:  12/7  945p - CM received a call from the pt's Nurse - Cala Bradford stating that MD wanted to speak w/ me.  I spoke w/ Dr. Denton Lank and he would like to order a BSC, RW, Aide, PT eval and treat. I then spoke w/ the pt and verified home address and phone number as correct on facesheet.  Pt states that she has a RW, she has a colostomy and ileostomy so will not need the [facility], she has a friend that will come and stay with her and assist as needed - as she did in July when she had a broken leg - so she does not feel that a HHC Aide will be needed. The only thing she feels that she needs is the [facility] PT.  She thinks the [facility] agancy she used in July was [facility].  I will verify w/ [facility] in am and if this is the agency the pt  previously used then will make referral and let her know.  If [facility] is not the agency she used in July then I will call the pt back and discuss other [facility] agencies and then make the referral.  Pt's Nurse aware of dc plan. TJohnson, RNBSN  [phone]

## 2013-09-20 DIAGNOSIS — M25559 Pain in unspecified hip: Secondary | ICD-10-CM

## 2013-09-20 DIAGNOSIS — E119 Type 2 diabetes mellitus without complications: Secondary | ICD-10-CM | POA: Diagnosis not present

## 2013-09-20 DIAGNOSIS — IMO0001 Reserved for inherently not codable concepts without codable children: Secondary | ICD-10-CM | POA: Diagnosis not present

## 2013-09-25 ENCOUNTER — Telehealth: Payer: Self-pay

## 2013-09-25 MED ORDER — METHOCARBAMOL 500 MG PO TABS: 500.0000 mg | ORAL_TABLET | Freq: Three times a day (TID) | ORAL | Status: DC | PRN

## 2013-09-25 NOTE — Telephone Encounter (Signed)
Stacy PT with [facility] left v/m; pt having muscle discomfort in both legs and pelvis region; Stacy wants to know if Dr Para March would prescribe a muscle relaxant. Stacy request cb.

## 2013-09-25 NOTE — Telephone Encounter (Signed)
Try robaxin for pain.  If not improved, then f/u in the clinic.  Thanks. Sent

## 2013-09-26 NOTE — Telephone Encounter (Signed)
PT and patient advised.

## 2013-10-04 ENCOUNTER — Ambulatory Visit (INDEPENDENT_AMBULATORY_CARE_PROVIDER_SITE_OTHER): Payer: Medicare Other | Admitting: Internal Medicine

## 2013-10-04 ENCOUNTER — Encounter: Payer: Self-pay | Admitting: Internal Medicine

## 2013-10-04 VITALS — BP 120/74 | HR 62 | Temp 97.3°F | Wt 152.8 lb

## 2013-10-04 DIAGNOSIS — S72009A Fracture of unspecified part of neck of unspecified femur, initial encounter for closed fracture: Secondary | ICD-10-CM | POA: Diagnosis not present

## 2013-10-04 DIAGNOSIS — S72001D Fracture of unspecified part of neck of right femur, subsequent encounter for closed fracture with routine healing: Secondary | ICD-10-CM

## 2013-10-04 NOTE — Progress Notes (Signed)
Subjective:    Patient ID: [name], female    DOB: 02-12-1935, 78 y.o.   MRN: [id number]  HPI  Pt presents to the clinic today for hospital followup. She had a fall and sustained a right superior ramus fracture of her hip. She was treated with pain medication and sent home with home health. Since that time, the pain has better controlled. She is taking methocarbamol and ultram for pain. She reports that the tramadol caused nightmares and so she is taking extra strength tylenol for the pain. She is taking the tylenol twice daily. She is currently walking with a walker but sometimes she is able to use a cane. She continues to work with PT and seems to be progressing well.   Review of Systems      Past Medical History  Diagnosis Date  . Anemia, iron deficiency   . History of breast cancer     Followed yearly by [facility]  . Coronary artery disease   . Diabetes mellitus, type 2   . Hyperlipemia   . Hypertension   . Osteoporosis   . Peripheral vascular disease   . 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 Diverticulitis   . Atrial fibrillation 11/28/2010    2D Echo - EF-30-35, left atrium moderate to severely dilated, right ventricle moderately dilated, tricuspid valve mild-moderate regurgitation  . Chronic kidney disease (CKD), stage II (mild)   . Nontraumatic retroperitoneal hematoma     Current Outpatient Prescriptions  Medication Sig Dispense Refill  . aspirin EC 81 MG tablet Take 81 mg by mouth daily.      Marland Kitchen atorvastatin (LIPITOR) 20 MG tablet Take 20 mg by mouth at bedtime.        . BD INSULIN SYRINGE ULTRAFINE 31G X 5/16" 0.3 ML MISC USE DAILY AND AS DIRECTED SLIDING SCALE  100 each  3  . benazepril (LOTENSIN) 10 MG tablet Take 0.5 tablets (5 mg total) by mouth daily.  30 tablet  3  . Cholecalciferol (VITAMIN D) 1000 UNITS capsule Take 1,000 Units by mouth 2 (two) times daily.        Marland Kitchen ezetimibe (ZETIA) 10 MG tablet Take 1 tablet (10 mg total) by mouth every other  day.  28 tablet  0  . glyBURIDE (DIABETA) 5 MG tablet TAKE 2 TABLETS (10 MG TOTAL) BY MOUTH DAILY WITH BREAKFAST.  180 tablet  3  . insulin aspart (NOVOLOG) 100 UNIT/ML injection Use as directed per sliding scale: 200-250 = 4-5 Units, 251-300 = 5-6 Units, 301-350 = 6-7 Units, with meals  1 vial  PRN  . meclizine (ANTIVERT) 25 MG tablet Take 1 tablet (25 mg total) by mouth daily as needed. For dizziness  30 tablet  6  . methocarbamol (ROBAXIN) 500 MG tablet Take 1 tablet (500 mg total) by mouth every 8 (eight) hours as needed for muscle spasms.  30 tablet  1  . Multiple Vitamin (MULTIVITAMIN) tablet Take 1 tablet by mouth daily.      . nitroGLYCERIN (NITROSTAT) 0.4 MG SL tablet Place 0.4 mg under the tongue as needed for chest pain. As directed      . Omega-3 Fatty Acids (FISH OIL) 1000 MG CAPS Take 1 capsule by mouth daily.       . ONE TOUCH ULTRA TEST test strip USE AS DIRECTED TO CHECK BLOOD SUGAR 4 TIMES A DAY PER SLIDING SCALE  100 each  5  . pioglitazone (ACTOS) 45  MG tablet TAKE 1 TABLET BY MOUTH DAILY.  90 tablet  3  . RANEXA 500 MG 12 hr tablet Take 500 mg by mouth 2 (two) times daily.       . sotalol (BETAPACE) 80 MG tablet Take 40 mg by mouth 2 (two) times daily.       . traMADol (ULTRAM) 50 MG tablet Take 1 tablet (50 mg total) by mouth every 6 (six) hours as needed.  30 tablet  0  . vitamin B-12 (CYANOCOBALAMIN) 1000 MCG tablet Take 1,000 mcg by mouth daily.        No current facility-administered medications for this visit.    Allergies  Allergen Reactions  . Metformin And Related     Held due to renal function  . Zocor [Simvastatin] Other (See Comments)    Myalgias    Family History  Problem Relation Age of Onset  . Hypertension Mother   . Diabetes Mother   . Hypertension Father   . Heart disease Father 24    Heart attack  . Heart disease Brother     CHF  . Hypertension Brother   . COPD Brother     bronchial, frequent pneumonia  . Hypertension Brother   .  Hyperlipidemia Brother   . Heart disease Brother     CABG  . Hypertension Brother   . Gout Brother   . Obesity Brother   . Diabetes Son   . Hypertension Son     History   Social History  . Marital Status: Married    Spouse Name: N/A    Number of Children: 2  . Years of Education: N/A   Occupational History  . Retired    Social History Main Topics  . Smoking status: Never Smoker   . Smokeless tobacco: Never Used  . Alcohol Use: No  . Drug Use: No  . Sexual Activity: Not on file   Other Topics Concern  . Not on file   Social History Narrative   Retired 1991 from [employer] work   Widowed as of 04/2011 after 50+ years   Lives alone   2 kids in [place]   Enjoys travelling and reading     Constitutional: Denies fever, malaise, fatigue, headache or abrupt weight changes.  Musculoskeletal: Pt has continued mild hip pain. Denies difficulty with gait, muscle pain or joint pain and swelling.  Skin: Denies redness, rashes, lesions or ulcercations.  Neurological: Denies dizziness, difficulty with memory, difficulty with speech or problems with balance and coordination.   No other specific complaints in a complete review of systems (except as listed in HPI above).  Objective:   Physical Exam   BP 120/74  Pulse 62  Temp(Src) 97.3 F (36.3 C) (Oral)  Wt 152 lb 12 oz (69.287 kg) Wt Readings from Last 3 Encounters:  10/04/13 152 lb 12 oz (69.287 kg)  09/10/13 153 lb (69.4 kg)  08/24/13 155 lb (70.308 kg)    General: Appears her stated age, well developed, well nourished in NAD. Cardiovascular: Normal rate and rhythm. S1,S2 noted.  No murmur, rubs or gallops noted. No JVD or BLE edema. No carotid bruits noted. Pulmonary/Chest: Normal effort and positive vesicular breath sounds. No respiratory distress. No wheezes, rales or ronchi noted.  Musculoskeletal: Decreased flexion, extension of the hips. No signs of joint swelling. Mild difficulty with gait.    Neurological: Alert and oriented. Cranial nerves II-XII intact.   BMET    Component Value Date/Time   NA 136 05/16/2013  1015   K 4.6 05/16/2013 1015   CL 102 05/16/2013 1015   CO2 26 05/16/2013 1015   GLUCOSE 271* 05/16/2013 1015   BUN 24* 05/16/2013 1015   CREATININE 1.4* 05/16/2013 1015   CREATININE 1.30* 02/16/2013 1229   CALCIUM 9.0 05/16/2013 1015   GFRNONAA 40* 10/11/2012 0720   GFRAA 46* 10/11/2012 0720    Lipid Panel     Component Value Date/Time   CHOL 146 02/16/2013 1229   TRIG 137 02/16/2013 1229   HDL 43 02/16/2013 1229   CHOLHDL 3.4 02/16/2013 1229   VLDL 27 02/16/2013 1229   LDLCALC 76 02/16/2013 1229    CBC    Component Value Date/Time   WBC 6.5 02/16/2013 1229   RBC 3.79* 02/16/2013 1229   HGB 11.4* 02/16/2013 1229   HCT 34.7* 02/16/2013 1229   PLT 291 02/16/2013 1229   MCV 91.6 02/16/2013 1229   MCH 30.1 02/16/2013 1229   MCHC 32.9 02/16/2013 1229   RDW 15.3 02/16/2013 1229   LYMPHSABS 1.3 12/13/2012 0942   MONOABS 0.8 12/13/2012 0942   EOSABS 0.2 12/13/2012 0942   BASOSABS 0.0 12/13/2012 0942    Hgb A1C Lab Results  Component Value Date   HGBA1C 8.3* 08/16/2013        Assessment & Plan:   Rip hip fracture:  Appears to be improving with PT Continue tylenol as needed for pain Stop taking the tramadal You can stop the robaxin if you feel it is not helpful  All hospital notes, images, labs reviewed, time to review approx 15 mnutes

## 2013-10-04 NOTE — Patient Instructions (Signed)
Hip Fracture (Upper Femoral Fracture) °You have a hip fracture (break in bone). This is a fracture of the upper part of the big bone (femur, thigh bone) between your hip and knee. If your caregiver feels it is a stable fracture, occasionally it can be treated without surgery. Usually these fractures are unstable. This means that the bones will not heal properly without surgery. Surgery is necessary to hold the bones together in a good position where they will heal well. °DIAGNOSIS °A physical exam can determine if a fracture has occurred. X-ray studies are needed to see what type of fracture is present and to look for other injuries. These studies will help your caregiver determine what the best treatment is for you. If there is more than one option, your caregiver can give you the information needed to help you decide on the treatment. °TREATMENT  °The treatment for an unstable fracture is usually surgery. This means using a screw, nail, or rod to hold the bones in place.  °RISKS AND COMPLICATIONS °All surgery is associated with risks. Sometimes the implant may fail. Other complications of surgery include infection or the bones not healing properly. Sometimes the fracture may damage the blood supply to the head of the femur. That portion of bone may die (osteonecrosis or avascular necrosis). Sometimes to avoid this complication, an implant is used which just replaces the ball of the femur (hemi-arthroplasty or prosthetic replacement). Some of the other risks are: °· Excessive bleeding. °· Infection. °· Dislocation if a hemi-arthroplasty or a total hip was inserted. °· Failure to heal properly resulting in an unstable hip. °· Stiffness of hip following repair. °· On occasion, blood may have to be replaced before or during the procedure °LET YOUR CAREGIVERS KNOW ABOUT: °· Allergies. °· Medications taken including herbs, eye drops, over the counter medications, and creams. °· Use of steroids (by mouth or  creams). °· History of bleeding or blood problems. °· History of serious infection. °· Previous problems with anesthetics or novocaine. °· Possibility of pregnancy, if this applies. °· History of blood clots (thrombophlebitis). °· Previous surgery. °· Other health problems. °BEFORE THE PROCEDURE °Before surgery, an IV (intravenous line connected to your vein) may be started. You will be given an anesthetic (medications and gas to make you sleep) or given medications in your back to make you numb from the waist down (spinal anesthetic). °AFTER THE PROCEDURE °After surgery, you will be taken to the recovery area where a nurse will watch your progress. You may have a catheter (a long, narrow, hollow tube) in your bladder that helps you pass your water. Once you're awake, stable, and taking fluids well, you will be returned to your room. You will receive physical therapy and other care until you are doing well and your caregiver feels it is safe for you to be transferred either to home or to an extended care facility. Your activity level will change as your caregiver determines what is best for you. °· You may resume normal diet and activities as directed or allowed. °· Change dressings if necessary or as directed. °· Only take over-the-counter or prescription medicines for pain, discomfort, or fever as directed by your caregiver. °· You may be placed on blood thinners for 4-6 weeks to prevent blood clots. °SEEK IMMEDIATE MEDICAL CARE IF: °· There is swelling of your calf or leg. °· You have shortness of breath or chest pain. °· There is redness, swelling, or increasing pain in the wound. °· There is   pus coming from wound. °· You have an unexplained oral temperature above 102° F (38.9° C). °· There is a foul (bad) smell coming from the wound or dressing. °· There is a breaking open of the wound (edges not staying together) after sutures or staples have been removed. °· There is a marked increase in pain or shortening of  the leg. °· You have severe pain anywhere in the leg. °· There is any change in color or temperature of your leg below the injury. °MAKE SURE YOU:  °· Understand these instructions. °· Will watch your condition. °· Will get help right away if you are not doing well or get worse. °Document Released: 09/21/2005 Document Revised: 12/14/2011 Document Reviewed: 05/03/2013 °Exit[employer]® Patient Information ©2014 [employer]. ° °

## 2013-10-04 NOTE — Progress Notes (Signed)
Pre-visit discussion using our clinic review tool. No additional management support is needed unless otherwise documented below in the visit note.  

## 2013-11-09 ENCOUNTER — Other Ambulatory Visit: Payer: Self-pay | Admitting: Cardiovascular Disease

## 2013-11-09 NOTE — Telephone Encounter (Signed)
Rx was sent to pharmacy electronically. 

## 2013-12-05 ENCOUNTER — Other Ambulatory Visit: Payer: Self-pay | Admitting: Cardiovascular Disease

## 2013-12-05 NOTE — Telephone Encounter (Signed)
Rx was sent to pharmacy electronically. 

## 2013-12-13 ENCOUNTER — Other Ambulatory Visit: Payer: Self-pay | Admitting: Family Medicine

## 2013-12-13 DIAGNOSIS — M810 Age-related osteoporosis without current pathological fracture: Secondary | ICD-10-CM

## 2013-12-13 DIAGNOSIS — D649 Anemia, unspecified: Secondary | ICD-10-CM

## 2013-12-13 DIAGNOSIS — E119 Type 2 diabetes mellitus without complications: Secondary | ICD-10-CM

## 2013-12-14 ENCOUNTER — Other Ambulatory Visit (INDEPENDENT_AMBULATORY_CARE_PROVIDER_SITE_OTHER): Payer: Medicare Other

## 2013-12-14 DIAGNOSIS — E119 Type 2 diabetes mellitus without complications: Secondary | ICD-10-CM

## 2013-12-14 DIAGNOSIS — D649 Anemia, unspecified: Secondary | ICD-10-CM | POA: Diagnosis not present

## 2013-12-14 DIAGNOSIS — I10 Essential (primary) hypertension: Secondary | ICD-10-CM | POA: Diagnosis not present

## 2013-12-14 DIAGNOSIS — E785 Hyperlipidemia, unspecified: Secondary | ICD-10-CM

## 2013-12-14 DIAGNOSIS — M810 Age-related osteoporosis without current pathological fracture: Secondary | ICD-10-CM

## 2013-12-14 DIAGNOSIS — E559 Vitamin D deficiency, unspecified: Secondary | ICD-10-CM

## 2013-12-14 DIAGNOSIS — D509 Iron deficiency anemia, unspecified: Secondary | ICD-10-CM

## 2013-12-14 LAB — COMPREHENSIVE METABOLIC PANEL
ALT: 11 U/L (ref 0–35)
AST: 16 U/L (ref 0–37)
Albumin: 3.8 g/dL (ref 3.5–5.2)
Alkaline Phosphatase: 75 U/L (ref 39–117)
BUN: 26 mg/dL — ABNORMAL HIGH (ref 6–23)
CO2: 30 mEq/L (ref 19–32)
Calcium: 9.2 mg/dL (ref 8.4–10.5)
Chloride: 105 mEq/L (ref 96–112)
Creatinine, Ser: 1.1 mg/dL (ref 0.4–1.2)
GFR: 49.7 mL/min — ABNORMAL LOW (ref >60.00)
Glucose, Bld: 122 mg/dL — ABNORMAL HIGH (ref 70–99)
Potassium: 4.9 mEq/L (ref 3.5–5.1)
Sodium: 141 mEq/L (ref 135–145)
Total Bilirubin: 0.5 mg/dL (ref 0.3–1.2)
Total Protein: 6.5 g/dL (ref 6.0–8.3)

## 2013-12-14 LAB — CBC WITH DIFFERENTIAL/PLATELET
Basophils Absolute: 0 10*3/uL (ref 0.0–0.1)
Basophils Relative: 0.4 % (ref 0.0–3.0)
Eosinophils Absolute: 0.1 10*3/uL (ref 0.0–0.7)
Eosinophils Relative: 2.1 % (ref 0.0–5.0)
HCT: 34.5 % — ABNORMAL LOW (ref 36.0–46.0)
Hemoglobin: 11.1 g/dL — ABNORMAL LOW (ref 12.0–15.0)
Lymphocytes Relative: 27.6 % (ref 12.0–46.0)
Lymphs Abs: 1.7 10*3/uL (ref 0.7–4.0)
MCHC: 32 g/dL (ref 30.0–36.0)
MCV: 96.5 fl (ref 78.0–100.0)
Monocytes Absolute: 1 10*3/uL (ref 0.1–1.0)
Monocytes Relative: 16.1 % — ABNORMAL HIGH (ref 3.0–12.0)
Neutro Abs: 3.2 10*3/uL (ref 1.4–7.7)
Neutrophils Relative %: 53.8 % (ref 43.0–77.0)
Platelets: 268 10*3/uL (ref 150.0–400.0)
RBC: 3.58 Mil/uL — ABNORMAL LOW (ref 3.87–5.11)
RDW: 15.5 % — ABNORMAL HIGH (ref 11.5–14.6)
WBC: 6 10*3/uL (ref 4.5–10.5)

## 2013-12-14 LAB — LIPID PANEL
Cholesterol: 136 mg/dL (ref 0–200)
HDL: 44.4 mg/dL (ref >39.00)
LDL Cholesterol: 69 mg/dL (ref 0–99)
Total CHOL/HDL Ratio: 3
Triglycerides: 115 mg/dL (ref 0.0–149.0)
VLDL: 23 mg/dL (ref 0.0–40.0)

## 2013-12-14 LAB — HEMOGLOBIN A1C: Hgb A1c MFr Bld: 8 % — ABNORMAL HIGH (ref 4.6–6.5)

## 2013-12-14 LAB — TSH: TSH: 2.31 u[IU]/mL (ref 0.35–5.50)

## 2013-12-15 LAB — VITAMIN D 25 HYDROXY (VIT D DEFICIENCY, FRACTURES): Vit D, 25-Hydroxy: 59 ng/mL (ref 30–89)

## 2013-12-21 ENCOUNTER — Ambulatory Visit (INDEPENDENT_AMBULATORY_CARE_PROVIDER_SITE_OTHER): Payer: Medicare Other | Admitting: Family Medicine

## 2013-12-21 ENCOUNTER — Encounter: Payer: Self-pay | Admitting: Family Medicine

## 2013-12-21 VITALS — BP 148/82 | HR 68 | Temp 97.6°F | Wt 158.8 lb

## 2013-12-21 DIAGNOSIS — E1159 Type 2 diabetes mellitus with other circulatory complications: Secondary | ICD-10-CM | POA: Diagnosis not present

## 2013-12-21 DIAGNOSIS — E2839 Other primary ovarian failure: Secondary | ICD-10-CM

## 2013-12-21 DIAGNOSIS — Z9189 Other specified personal risk factors, not elsewhere classified: Secondary | ICD-10-CM

## 2013-12-21 DIAGNOSIS — I798 Other disorders of arteries, arterioles and capillaries in diseases classified elsewhere: Secondary | ICD-10-CM

## 2013-12-21 DIAGNOSIS — M810 Age-related osteoporosis without current pathological fracture: Secondary | ICD-10-CM

## 2013-12-21 DIAGNOSIS — Z9889 Other specified postprocedural states: Secondary | ICD-10-CM

## 2013-12-21 DIAGNOSIS — Z87898 Personal history of other specified conditions: Secondary | ICD-10-CM

## 2013-12-21 DIAGNOSIS — Z1231 Encounter for screening mammogram for malignant neoplasm of breast: Secondary | ICD-10-CM | POA: Diagnosis not present

## 2013-12-21 NOTE — Assessment & Plan Note (Signed)
Continue as is with meds, goal A1c ~8. D/w pt. Recheck in a few months.  She agrees.  Okay for outpatient f/u.

## 2013-12-21 NOTE — Progress Notes (Signed)
Pre visit review using our clinic review tool, if applicable. No additional management support is needed unless otherwise documented below in the visit note.  She stepping in a hole, had a pelvic fx.  S/p PT, no surgery.  Now off the walker, back to baseline.  Discussed DXA screening.  Will arrange.    Diabetes:  Using medications without difficulties: yes Hypoglycemic episodes:rare, only with extra dose of insulin at night, discussed Hyperglycemic episodes:no Feet problems:no Blood Sugars averaging: usually controlled in AM, higher in the AM eye exam within last year: yes A1c 8.   Meds, vitals, and allergies reviewed.   ROS: See HPI.  Otherwise negative.    GEN: nad, alert and oriented HEENT: mucous membranes moist NECK: supple w/o LA CV: RRR PULM: ctab, no inc wob ABD: soft, +bs EXT: no edema SKIN: no acute rash  Diabetic foot exam: Normal inspection No skin breakdown No calluses  Normal DP pulses Normal sensation to light touch and monofilament Nails normal

## 2013-12-21 NOTE — Patient Instructions (Addendum)
[name] will call about your referral for the DXA and mammogram.   Don't change your meds for now.   Recheck in about 6 months.   Call back sooner if needed.  Take care.  Start back on your balance exercises.

## 2013-12-21 NOTE — Assessment & Plan Note (Signed)
Recent fracture hx noted. Discussed DXA screening.  Will arrange.

## 2013-12-22 NOTE — Addendum Note (Signed)
Addended by: Tonia Ghent on: 12/22/2013 08:43 AM   Modules accepted: Orders

## 2013-12-29 ENCOUNTER — Telehealth: Payer: Self-pay

## 2013-12-29 NOTE — Telephone Encounter (Signed)
Relevant patient education assigned to patient using Emmi. ° °

## 2014-01-02 DIAGNOSIS — E2839 Other primary ovarian failure: Secondary | ICD-10-CM | POA: Diagnosis not present

## 2014-01-02 DIAGNOSIS — R928 Other abnormal and inconclusive findings on diagnostic imaging of breast: Secondary | ICD-10-CM | POA: Diagnosis not present

## 2014-01-02 DIAGNOSIS — IMO0002 Reserved for concepts with insufficient information to code with codable children: Secondary | ICD-10-CM | POA: Diagnosis not present

## 2014-01-02 DIAGNOSIS — M810 Age-related osteoporosis without current pathological fracture: Secondary | ICD-10-CM | POA: Diagnosis not present

## 2014-01-03 ENCOUNTER — Encounter: Payer: Self-pay | Admitting: Family Medicine

## 2014-01-04 ENCOUNTER — Encounter: Payer: Self-pay | Admitting: *Deleted

## 2014-01-05 ENCOUNTER — Telehealth: Payer: Self-pay | Admitting: Family Medicine

## 2014-01-05 ENCOUNTER — Other Ambulatory Visit: Payer: Self-pay | Admitting: Family Medicine

## 2014-01-05 NOTE — Telephone Encounter (Signed)
Please call pt.  DXA with osteoporosis.  Had been on fosamax for years prev, stopped med in 2010.  Likely next step would be prolia.  If she would like to discuss, then let me know.  Her last vit D was okay.  Thanks.

## 2014-01-08 NOTE — Telephone Encounter (Signed)
Patient advised.   She will investigate the Prolia a bit and call back if she decides to pursue it or to get an appt with Dr. Damita Dunnings to discuss.

## 2014-01-17 ENCOUNTER — Other Ambulatory Visit: Payer: Self-pay | Admitting: Family Medicine

## 2014-01-18 ENCOUNTER — Telehealth: Payer: Self-pay | Admitting: Cardiovascular Disease

## 2014-01-18 NOTE — Telephone Encounter (Signed)
Patient notified to continue same meds and report to [place] if she has any more episodes of palpitations.

## 2014-01-18 NOTE — Telephone Encounter (Signed)
Woke up last PM with an irregular heart beat lasting about 1-1/2 hours.  Felt strange/confused so she took her BS found 62 - ate 5 sugar tabs and BS went up to 190. States her BS has been in the 40's before and did not have palpitations.  Able to go back to sleep with no problems and felt fine upon waking this AM.  Took BP @ 10am 154/83 HR 60 and regular.  Did not check her BP or HR when she felt the irregular heart beats.  Will review w/Dr. C for any suggestions.

## 2014-01-18 NOTE — Telephone Encounter (Signed)
I think the episode was brief enough that we will not change her management. Tell her to let me know if it happens again, please

## 2014-01-18 NOTE — Telephone Encounter (Signed)
Tennis Must C told her to call if she started having irregular heart rates. Pt said she started around 1:00 this morning having them and it lasted for over a hour,her blood sugar was also low.

## 2014-01-25 ENCOUNTER — Encounter: Payer: Self-pay | Admitting: Family Medicine

## 2014-01-26 ENCOUNTER — Other Ambulatory Visit: Payer: Self-pay | Admitting: *Deleted

## 2014-01-26 ENCOUNTER — Other Ambulatory Visit: Payer: Self-pay

## 2014-01-26 DIAGNOSIS — E785 Hyperlipidemia, unspecified: Secondary | ICD-10-CM

## 2014-01-26 DIAGNOSIS — I25810 Atherosclerosis of coronary artery bypass graft(s) without angina pectoris: Secondary | ICD-10-CM

## 2014-01-26 MED ORDER — EZETIMIBE 10 MG PO TABS: 10.0000 mg | ORAL_TABLET | ORAL | Status: DC

## 2014-01-26 NOTE — Telephone Encounter (Signed)
Rx was sent to pharmacy electronically. 

## 2014-01-29 ENCOUNTER — Telehealth: Payer: Self-pay | Admitting: Cardiovascular Disease

## 2014-01-29 DIAGNOSIS — E785 Hyperlipidemia, unspecified: Secondary | ICD-10-CM

## 2014-01-29 DIAGNOSIS — I25810 Atherosclerosis of coronary artery bypass graft(s) without angina pectoris: Secondary | ICD-10-CM

## 2014-01-29 MED ORDER — EZETIMIBE 10 MG PO TABS: 10.0000 mg | ORAL_TABLET | ORAL | Status: DC

## 2014-01-29 NOTE — Telephone Encounter (Signed)
Need a refill on her Zetia 10 mg #90.

## 2014-01-29 NOTE — Telephone Encounter (Signed)
Rx was sent to pharmacy electronically. 

## 2014-02-20 ENCOUNTER — Ambulatory Visit (INDEPENDENT_AMBULATORY_CARE_PROVIDER_SITE_OTHER): Payer: Medicare Other | Admitting: Cardiovascular Disease

## 2014-02-20 ENCOUNTER — Encounter: Payer: Self-pay | Admitting: Cardiovascular Disease

## 2014-02-20 VITALS — BP 142/78 | HR 55 | Resp 16 | Ht 66.0 in | Wt 158.5 lb

## 2014-02-20 DIAGNOSIS — E119 Type 2 diabetes mellitus without complications: Secondary | ICD-10-CM

## 2014-02-20 DIAGNOSIS — I739 Peripheral vascular disease, unspecified: Secondary | ICD-10-CM | POA: Diagnosis not present

## 2014-02-20 DIAGNOSIS — I2589 Other forms of chronic ischemic heart disease: Secondary | ICD-10-CM

## 2014-02-20 DIAGNOSIS — I10 Essential (primary) hypertension: Secondary | ICD-10-CM | POA: Diagnosis not present

## 2014-02-20 DIAGNOSIS — I255 Ischemic cardiomyopathy: Secondary | ICD-10-CM

## 2014-02-20 DIAGNOSIS — I2510 Atherosclerotic heart disease of native coronary artery without angina pectoris: Secondary | ICD-10-CM | POA: Diagnosis not present

## 2014-02-20 DIAGNOSIS — E785 Hyperlipidemia, unspecified: Secondary | ICD-10-CM

## 2014-02-20 DIAGNOSIS — I4891 Unspecified atrial fibrillation: Secondary | ICD-10-CM

## 2014-02-20 MED ORDER — NITROGLYCERIN 0.4 MG SL SUBL: 0.4000 mg | SUBLINGUAL_TABLET | SUBLINGUAL | Status: DC | PRN

## 2014-02-20 MED ORDER — BENAZEPRIL HCL 5 MG PO TABS: 5.0000 mg | ORAL_TABLET | Freq: Every day | ORAL | Status: DC

## 2014-02-20 NOTE — Patient Instructions (Signed)
A new Rx has been sent to your pharmacy for Benazepril 5mg  - take one tablet daily.  A refill for the NTG sent to your pharmacy.  Dr. Sallyanne Kuster recommends that you schedule a follow-up appointment in: 6 months.

## 2014-02-22 ENCOUNTER — Encounter: Payer: Self-pay | Admitting: Cardiovascular Disease

## 2014-02-22 NOTE — Progress Notes (Signed)
Patient ID: [name], female   DOB: [birth year], 79 y.o.   MRN: [id number]      Reason for office visit Followup atrial fibrillation, CAD, ischemic cardiomyopathy  [name] has generally done well and has no had any falls or injuries since January. She remains angina free. No change in NYHA class II dyspnea.. No neurological events or bleeding on ASA monotherapy.  Today in sinus bradycardia 55 bpm, QTC 430 ms. Hgb A1c a little worse at 8%, but she had to liberalize control due to recurrent nocturnal hypoglycemia. Good lipid profile" TC 136, TG 115, HDL 44, LDL 69.  [name] is a 79 year old woman with a history of coronary disease, now 11 years status post post bypass surgery. She has total occlusion of the 3 saphenous vein grafts, but a patent internal mammary artery bypass to the LAD artery. There is total occlusion of the major oblique marginal artery and the right coronary artery with some collateral filling. She has a history of paroxysmal atrial fibrillation. While on treatment with aspirin and Plavix she developed a spontaneous retroperitoneal hematoma. She is therefore not taking potent anticoagulants. Despite a moderately depressed left ventricular systolic function with an ejection fraction of 35%, she does not have much in the way of symptoms of congestive heart failure. She has a mild to moderate degree of mitral insufficiency. She has a moderate to severely dilated left atrium.  She has not had any clinical symptoms of atrial fibrillation or any issues with worsening shortness of breath since her last appointment in January. She has chronic mild dyspnea New York Heart Association functional class II. She denies angina pectoris and chest pain has been well-controlled since therapy with a Ranexa was initiated. Of note she was unable to tolerate full dose Ranexa do to involuntary muscle twitching.  Significant comorbidities include type 2 diabetes mellitus, mixed hyperlipidemia and systemic  hypertension. She is due to have a lipid profile performed.   Allergies  Allergen Reactions  . Metformin And Related     Held due to renal function  . Zocor [Simvastatin] Other (See Comments)    Myalgias    Current Outpatient Prescriptions  Medication Sig Dispense Refill  . aspirin EC 81 MG tablet Take 81 mg by mouth daily.      Marland Kitchen atorvastatin (LIPITOR) 20 MG tablet TAKE 1 TABLET BY MOUTH AT BEDTIME  90 tablet  2  . BD INSULIN SYRINGE ULTRAFINE 31G X 5/16" 0.3 ML MISC USE DAILY AND AS DIRECTED SLIDING SCALE  100 each  3  . Cholecalciferol (VITAMIN D) 1000 UNITS capsule Take 1,000 Units by mouth 2 (two) times daily.        Marland Kitchen ezetimibe (ZETIA) 10 MG tablet Take 1 tablet (10 mg total) by mouth every other day.  30 tablet  7  . glyBURIDE (DIABETA) 5 MG tablet TAKE 2 TABLETS (10 MG TOTAL) BY MOUTH DAILY WITH BREAKFAST.  180 tablet  1  . insulin aspart (NOVOLOG) 100 UNIT/ML injection Use as directed per sliding scale: 200-250 = 4-5 Units, 251-300 = 5-6 Units, 301-350 = 6-7 Units, with meals  1 vial  PRN  . meclizine (ANTIVERT) 25 MG tablet Take 1 tablet (25 mg total) by mouth daily as needed. For dizziness  30 tablet  6  . Multiple Vitamin (MULTIVITAMIN) tablet Take 1 tablet by mouth daily.      . nitroGLYCERIN (NITROSTAT) 0.4 MG SL tablet Place 1 tablet (0.4 mg total) under the tongue as needed for chest pain.  As directed  25 tablet  11  . Omega-3 Fatty Acids (FISH OIL) 1000 MG CAPS Take 1 capsule by mouth daily.       . ONE TOUCH ULTRA TEST test strip USE AS DIRECTED TO CHECK BLOOD SUGAR 4 TIMES A DAY PER SLIDING SCALE  100 each  5  . pioglitazone (ACTOS) 45 MG tablet TAKE 1 TABLET BY MOUTH DAILY.  90 tablet  3  . RANEXA 500 MG 12 hr tablet TAKE 1 TABLET BY MOUTH TWICE DAILY  180 tablet  2  . SOTALOL AF 80 MG TABS TAKE 1/2 TABLET BY MOUTH TWICE DAILY  90 tablet  2  . vitamin B-12 (CYANOCOBALAMIN) 1000 MCG tablet Take 1,000 mcg by mouth daily.       . benazepril (LOTENSIN) 5 MG tablet Take 1  tablet (5 mg total) by mouth daily.  90 tablet  3   No current facility-administered medications for this visit.    Past Medical History  Diagnosis Date  . Anemia, iron deficiency   . History of breast cancer     Followed yearly by [facility]  . Coronary artery disease   . Diabetes mellitus, type 2   . Hyperlipemia   . Hypertension   . Osteoporosis   . Peripheral vascular disease   . 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 Hassell Done  . Diverticulitis   . Atrial fibrillation 11/28/2010    2D Echo - EF-30-35, left atrium moderate to severely dilated, right ventricle moderately dilated, tricuspid valve mild-moderate regurgitation  . Chronic kidney disease (CKD), stage II (mild)   . Nontraumatic retroperitoneal hematoma     Past Surgical History  Procedure Laterality Date  . Appendectomy  1962  . Dilation and curettage of uterus  1971     miscarrage, abnormal bleeding  . Dilation and curettage of uterus  1995    Menorrhagia  . Breast surgery  02/01    Quadrectomy Breast cancer (Dr. Hassell Done)  . Coronary artery bypass graft  02/09/02    X 4 ([place])  . Shoulder arthroscopy  05/04    Right, with rotator cuff debridement  . Hemorrhoid surgery  09/11/04    Binding Hassell Done)  . Cataract extraction  10/10 and 02/11  . Colostomy    . Partial colectomy    . Cardiac catheterization  2000    Iliac Stent (Dr. Juanita Craver) yearly follow-up  . Cardiac catheterization  01/25/02    High grade LAD disease, total of 3 vess, involvement  . Cardiac catheterization  01/13/07    SV graft to diag. occluded but good backfill o/w ok  . Cardiac catheterization  02/14/10    PTCA & stent vein graft to OM/RCA ([place])  . Cardiac catheterization  04/17/2011    Saphenous vein grafts to the RCA 100% occluded, to the OM 100% occluded, and the diagonal 100% occluded. Internal mammary artery to the LAD, widely patent.    Family History  Problem Relation Age of Onset  . Hypertension Mother   . Diabetes Mother     . Hypertension Father   . Heart disease Father 57    Heart attack  . Heart disease Brother     CHF  . Hypertension Brother   . COPD Brother     bronchial, frequent pneumonia  . Hypertension Brother   . Hyperlipidemia Brother   . Heart disease Brother     CABG  . Hypertension Brother   . Gout Brother   . Obesity  Brother   . Diabetes Son   . Hypertension Son     History   Social History  . Marital Status: Married    Spouse Name: N/A    Number of Children: 2  . Years of Education: N/A   Occupational History  . Retired    Social History Main Topics  . Smoking status: Never Smoker   . Smokeless tobacco: Never Used  . Alcohol Use: No  . Drug Use: No  . Sexual Activity: Not on file   Other Topics Concern  . Not on file   Social History Narrative   Retired 1991 from [employer] work   Widowed as of 04/2011 after 50+ years   Lives alone   2 kids in [place]   Enjoys travelling and reading    Review of systems: The patient specifically denies any chest pain at rest or with exertion, dyspnea at rest or with usual exertion, orthopnea, paroxysmal nocturnal dyspnea, syncope, palpitations, focal neurological deficits, intermittent claudication, lower extremity edema, unexplained weight gain, cough, hemoptysis or wheezing.  The patient also denies abdominal pain, nausea, vomiting, dysphagia, diarrhea, constipation, polyuria, polydipsia, dysuria, hematuria, frequency, urgency, abnormal bleeding or bruising, fever, chills, unexpected weight changes, mood swings, change in skin or hair texture, change in voice quality, auditory or visual problems, allergic reactions or rashes, new musculoskeletal complaints other than usual "aches and pains".   PHYSICAL EXAM BP 142/78  Pulse 55  Resp 16  Ht 5\' 6"  (1.676 m)  Wt 158 lb 8 oz (71.895 kg)  BMI 25.59 kg/m2 General: Alert, oriented x3, no distress  Head: no evidence of trauma, PERRL, EOMI, no exophtalmos or lid lag, no  myxedema, no xanthelasma; normal ears, nose and oropharynx  Neck: normal jugular venous pulsations and no hepatojugular reflux; brisk carotid pulses without delay and no carotid bruits  Chest: clear to auscultation, no signs of consolidation by percussion or palpation, normal fremitus, symmetrical and full respiratory excursions. Well-healed sternotomy scar  Cardiovascular: normal position and quality of the apical impulse, regular rhythm, normal first and second heart sounds, no rubs or gallops. Grade 1/6 soft systolic murmur at the apex with limited radiation to axilla  Abdomen: no tenderness or distention, no masses by palpation, no abnormal pulsatility or arterial bruits, normal bowel sounds, no hepatosplenomegaly  Extremities: no clubbing, cyanosis or edema; 2+ radial, ulnar and brachial pulses bilaterally; 2+ right femoral, 1+ posterior tibial and dorsalis pedis pulses; 2+ left femoral, 1+ posterior tibial and dorsalis pedis pulses; no subclavian or femoral bruits  Prominent ecchymosis of left upper forearm, minimally tender  Neurological: grossly nonfocal    EKG: Sinus bradycardia, possible old inferior MI  Lipid Panel     Component Value Date/Time   CHOL 136 12/14/2013 0909   TRIG 115.0 12/14/2013 0909   HDL 44.40 12/14/2013 0909   CHOLHDL 3 12/14/2013 0909   VLDL 23.0 12/14/2013 0909   LDLCALC 69 12/14/2013 0909    BMET    Component Value Date/Time   NA 141 12/14/2013 0909   K 4.9 12/14/2013 0909   CL 105 12/14/2013 0909   CO2 30 12/14/2013 0909   GLUCOSE 122* 12/14/2013 0909   BUN 26* 12/14/2013 0909   CREATININE 1.1 12/14/2013 0909   CREATININE 1.30* 02/16/2013 1229   CALCIUM 9.2 12/14/2013 0909   GFRNONAA 40* 10/11/2012 0720   GFRAA 46* 10/11/2012 0720     ASSESSMENT AND PLAN No problem-specific assessment & plan notes found for this encounter.  HYPERLIPIDEMIA  Recent  lipid profile shows satisfactory levels. Continue combination statin-Zetia therapy   HYPERTENSION  Blood  pressure fair today, usually even lower  Atrial fibrillation  No clinical recurrence recently. Her QTC interval is acceptable. The potential interaction between sotalol and ranolazine is acknowledged but does not appear to be a problem in her case. Discussed the need to avoid QT prolonging agents. She is not taking anticoagulants due to history of retroperitoneal hematoma while taking aspirin and Plavix. Her recent serious falls and injuries reinforce the need to avoid full anticoagulation.   Coronary artery disease  Asymptomatic. Ranexa has been of great benefit in protecting her from angina.   Cardiomyopathy, ischemic  Despite moderately depressed left ventricular systolic function she does not have overt congestive heart failure (NYHA functional class 1-2) and has not required diuretic therapy. She appears to be clinically euvolemic. Sotalol is providing her beta blocker therapy, she is receiving ACE inhibitor in an acceptable dose. At risk for hypotension and falls, her ACE inhibitor dose is not increased. She prefers the 5 mg tablet so she doesn't have to cut it in half. She is tolerating therapy with full dose Actos. If she were to develop symptoms of congestive heart failure, I would not hesitate to discontinue the Actos   Diabetes mellitus, type 2  Control is mediocre, but she has been plagued with frequent episodes of hypoglycemia.   PERIPHERAL VASCULAR DISEASE  She does not have symptoms of intermittent claudication. Her left knee discomfort is convincingly arthralgia and not claudication. Note however that by previous Doppler ultrasonography she has significant PAD and has had previous iliac stent.  Patient Instructions  A new Rx has been sent to your pharmacy for Benazepril 5mg  - take one tablet daily.  A refill for the NTG sent to your pharmacy.  Dr. Sallyanne Kuster recommends that you schedule a follow-up appointment in: 6 months.        Orders Placed This Encounter    Procedures  . EKG 12-Lead   Meds ordered this encounter  Medications  . benazepril (LOTENSIN) 5 MG tablet    Sig: Take 1 tablet (5 mg total) by mouth daily.    Dispense:  90 tablet    Refill:  3  . nitroGLYCERIN (NITROSTAT) 0.4 MG SL tablet    Sig: Place 1 tablet (0.4 mg total) under the tongue as needed for chest pain. As directed    Dispense:  25 tablet    Refill:  [id number]  Sanda Klein, MD, [facility] [phone] office [phone] pager

## 2014-03-02 ENCOUNTER — Other Ambulatory Visit: Payer: Self-pay | Admitting: Family Medicine

## 2014-03-09 ENCOUNTER — Other Ambulatory Visit: Payer: Self-pay | Admitting: Family Medicine

## 2014-03-12 DIAGNOSIS — L988 Other specified disorders of the skin and subcutaneous tissue: Secondary | ICD-10-CM | POA: Diagnosis not present

## 2014-03-12 DIAGNOSIS — L570 Actinic keratosis: Secondary | ICD-10-CM | POA: Diagnosis not present

## 2014-03-12 DIAGNOSIS — L821 Other seborrheic keratosis: Secondary | ICD-10-CM | POA: Diagnosis not present

## 2014-04-04 ENCOUNTER — Telehealth (INDEPENDENT_AMBULATORY_CARE_PROVIDER_SITE_OTHER): Payer: Self-pay

## 2014-04-04 NOTE — Telephone Encounter (Signed)
Received order for ostomy supplies. To BT to sign on 7-8 when back in office.

## 2014-05-03 ENCOUNTER — Other Ambulatory Visit: Payer: Self-pay | Admitting: Family Medicine

## 2014-05-18 DIAGNOSIS — E119 Type 2 diabetes mellitus without complications: Secondary | ICD-10-CM | POA: Diagnosis not present

## 2014-05-18 LAB — HM DIABETES EYE EXAM

## 2014-05-31 ENCOUNTER — Encounter: Payer: Self-pay | Admitting: Family Medicine

## 2014-06-18 ENCOUNTER — Other Ambulatory Visit (INDEPENDENT_AMBULATORY_CARE_PROVIDER_SITE_OTHER): Payer: Medicare Other

## 2014-06-18 DIAGNOSIS — I798 Other disorders of arteries, arterioles and capillaries in diseases classified elsewhere: Secondary | ICD-10-CM

## 2014-06-18 DIAGNOSIS — E1159 Type 2 diabetes mellitus with other circulatory complications: Secondary | ICD-10-CM | POA: Diagnosis not present

## 2014-06-18 LAB — HEMOGLOBIN A1C: Hgb A1c MFr Bld: 8.3 % — ABNORMAL HIGH (ref 4.6–6.5)

## 2014-06-25 ENCOUNTER — Encounter: Payer: Self-pay | Admitting: Family Medicine

## 2014-06-25 ENCOUNTER — Ambulatory Visit (INDEPENDENT_AMBULATORY_CARE_PROVIDER_SITE_OTHER): Payer: Medicare Other | Admitting: Family Medicine

## 2014-06-25 VITALS — BP 130/78 | HR 62 | Temp 98.2°F | Wt 157.8 lb

## 2014-06-25 DIAGNOSIS — I2510 Atherosclerotic heart disease of native coronary artery without angina pectoris: Secondary | ICD-10-CM

## 2014-06-25 DIAGNOSIS — Z23 Encounter for immunization: Secondary | ICD-10-CM

## 2014-06-25 DIAGNOSIS — I798 Other disorders of arteries, arterioles and capillaries in diseases classified elsewhere: Secondary | ICD-10-CM | POA: Diagnosis not present

## 2014-06-25 DIAGNOSIS — E1159 Type 2 diabetes mellitus with other circulatory complications: Secondary | ICD-10-CM | POA: Diagnosis not present

## 2014-06-25 NOTE — Progress Notes (Signed)
Pre visit review using our clinic review tool, if applicable. No additional management support is needed unless otherwise documented below in the visit note.  Diabetes:  Using medications without difficulties:yes Hypoglycemic episodes:no Hyperglycemic episodes:no Feet problems:see below, no new sx Blood Sugars averaging:<100 in AMs eye exam within last year:yes She has some claudication with walking about 1/2 block, this is stable.   DXA done 2015, d/w pt about prolia.  She wanted to defer tx for now.  Likely not a candidate for bisphosphonate with Cr hx.  D/w pt.    PMH and SH reviewed  Meds, vitals, and allergies reviewed.   ROS: See HPI.  Otherwise negative.    GEN: nad, alert and oriented HEENT: mucous membranes moist NECK: supple w/o LA CV: sounds to be rrr. PULM: ctab, no inc wob ABD: soft, +bs EXT: no edema SKIN: no acute rash  Diabetic foot exam: Normal inspection No skin breakdown No calluses  Dec DP pulses Normal sensation to light touch and monofilament Nails thick on first toes

## 2014-06-25 NOTE — Patient Instructions (Signed)
Take care. Don't change your meds.   Recheck in about 6 months with labs before a physical.  Glad to see you.

## 2014-06-26 NOTE — Assessment & Plan Note (Signed)
Target A1c ~8, dw pt.  A1c okay for now. Wouldn't change meds.  Recheck in about 6 months.  She agrees. Continue diet in meantime.  Flu shot today. >25 minutes spent in face to face time with patient, >50% spent in counselling or coordination of care.

## 2014-07-04 ENCOUNTER — Other Ambulatory Visit: Payer: Self-pay | Admitting: Family Medicine

## 2014-07-13 ENCOUNTER — Other Ambulatory Visit: Payer: Self-pay | Admitting: Family Medicine

## 2014-08-06 ENCOUNTER — Other Ambulatory Visit: Payer: Self-pay | Admitting: Cardiovascular Disease

## 2014-08-23 ENCOUNTER — Ambulatory Visit: Payer: Medicare Other | Admitting: Cardiovascular Disease

## 2014-08-30 ENCOUNTER — Other Ambulatory Visit: Payer: Self-pay | Admitting: Family Medicine

## 2014-09-14 ENCOUNTER — Other Ambulatory Visit: Payer: Self-pay | Admitting: Cardiovascular Disease

## 2014-09-14 NOTE — Telephone Encounter (Signed)
Rx has been sent to the pharmacy electronically. ° °

## 2014-10-08 ENCOUNTER — Other Ambulatory Visit: Payer: Self-pay | Admitting: Cardiovascular Disease

## 2014-10-15 ENCOUNTER — Ambulatory Visit (INDEPENDENT_AMBULATORY_CARE_PROVIDER_SITE_OTHER): Payer: Medicare Other | Admitting: Cardiovascular Disease

## 2014-10-15 ENCOUNTER — Encounter: Payer: Self-pay | Admitting: Cardiovascular Disease

## 2014-10-15 VITALS — BP 137/86 | HR 65 | Resp 16 | Ht 65.0 in | Wt 152.8 lb

## 2014-10-15 DIAGNOSIS — I255 Ischemic cardiomyopathy: Secondary | ICD-10-CM

## 2014-10-15 DIAGNOSIS — I739 Peripheral vascular disease, unspecified: Secondary | ICD-10-CM

## 2014-10-15 DIAGNOSIS — E1159 Type 2 diabetes mellitus with other circulatory complications: Secondary | ICD-10-CM

## 2014-10-15 DIAGNOSIS — E1151 Type 2 diabetes mellitus with diabetic peripheral angiopathy without gangrene: Secondary | ICD-10-CM | POA: Diagnosis not present

## 2014-10-15 DIAGNOSIS — I2510 Atherosclerotic heart disease of native coronary artery without angina pectoris: Secondary | ICD-10-CM

## 2014-10-15 DIAGNOSIS — I4891 Unspecified atrial fibrillation: Secondary | ICD-10-CM

## 2014-10-15 NOTE — Patient Instructions (Signed)
Dr. Croitoru recommends that you schedule a follow-up appointment in: 6 months    

## 2014-10-16 ENCOUNTER — Encounter: Payer: Self-pay | Admitting: Cardiovascular Disease

## 2014-10-16 NOTE — Progress Notes (Signed)
Patient ID: [name], female   DOB: 1934-11-13, 80 y.o.   MRN: [id number]     Reason for office visit CAD, ischemic cardiomyopathy, PAF  Generally feeling well, without clinically evident AFib. Denies angina and dyspnea. She lost her balance leaning over and fell Thanksgiving week. She suffered a foot fracture.  [name] is a 80 year old woman with a history of coronary disease, now 11 years status post post bypass surgery. (total occlusion 3 saphenous vein grafts, patent LIMA to LAD, total occlusion of the major OM artery and the right coronary artery with some collateral filling).  She has a history of paroxysmal atrial fibrillation. While on treatment with aspirin and Plavix she developed a spontaneous retroperitoneal hematoma. She is therefore not taking anticoagulants. Moderately depressed left ventricular systolic function with an ejection fraction of 35%, but she does not have much in the way of symptoms of congestive heart failure. She has a mild to moderate degree of mitral insufficiency. She has a moderate to severely dilated left atrium. Ranexa has had excellent positive impact on her angina. Despite also taking sotalol she has never shown QT interval prolongation. Also has type 2 diabetes mellitus, mixed hyperlipidemia and systemic hypertension. She has had several serious falls and injuries due to poor balance, not syncope. She still lives alone  Allergies  Allergen Reactions  . Metformin And Related     Held due to renal function  . Zocor [Simvastatin] Other (See Comments)    Myalgias    Current Outpatient Prescriptions  Medication Sig Dispense Refill  . aspirin EC 81 MG tablet Take 81 mg by mouth daily.    Marland Kitchen atorvastatin (LIPITOR) 20 MG tablet TAKE 1 TABLET BY MOUTH AT BEDTIME 90 tablet 0  . BD INSULIN SYRINGE ULTRAFINE 31G X 5/16" 0.3 ML MISC USE DAILY AND AS DIRECTED SLIDING SCALE 100 each 3  . benazepril (LOTENSIN) 5 MG tablet Take 1 tablet (5 mg total) by mouth  daily. 90 tablet 3  . Cholecalciferol (VITAMIN D) 1000 UNITS capsule Take 1,000 Units by mouth 2 (two) times daily.      Marland Kitchen ezetimibe (ZETIA) 10 MG tablet Take 1 tablet (10 mg total) by mouth every other day. 30 tablet 7  . glyBURIDE (DIABETA) 5 MG tablet TAKE 2 TABLETS (10 MG TOTAL) BY MOUTH DAILY WITH BREAKFAST. 180 tablet 1  . meclizine (ANTIVERT) 25 MG tablet Take 1 tablet (25 mg total) by mouth daily as needed. For dizziness 30 tablet 6  . Multiple Vitamin (MULTIVITAMIN) tablet Take 1 tablet by mouth daily.    . nitroGLYCERIN (NITROSTAT) 0.4 MG SL tablet Place 1 tablet (0.4 mg total) under the tongue as needed for chest pain. As directed 25 tablet 11  . NOVOLOG 100 UNIT/ML injection USE PER SLIDING SCALE--200-250=4-5 UNITS, 251-300=5-6 UNITS,301-350=6-7 UNITS, WITH MEALS 10 mL 2  . Omega-3 Fatty Acids (FISH OIL) 1000 MG CAPS Take 1 capsule by mouth daily.     . ONE TOUCH ULTRA TEST test strip USE AS DIRECTED TO CHECK BLOOD SUGAR 4 TIMES A DAY PER SLIDING SCALE 100 each 5  . pioglitazone (ACTOS) 45 MG tablet TAKE 1 TABLET BY MOUTH DAILY. 90 tablet 3  . RANEXA 500 MG 12 hr tablet TAKE 1 TABLET BY MOUTH TWICE DAILY 60 tablet 1  . SOTALOL AF 80 MG TABS TAKE 1/2 TABLET BY MOUTH TWICE DAILY 30 tablet 1  . vitamin B-12 (CYANOCOBALAMIN) 1000 MCG tablet Take 1,000 mcg by mouth daily.  No current facility-administered medications for this visit.    Past Medical History  Diagnosis Date  . Anemia, iron deficiency   . History of breast cancer     Followed yearly by [facility]  . Coronary artery disease   . Diabetes mellitus, type 2   . Hyperlipemia   . Hypertension   . Osteoporosis   . Peripheral vascular disease   . Cancer     R beast cancer followed by [name]  . Diverticulitis   . Atrial fibrillation 11/28/2010    2D Echo - EF-30-35, left atrium moderate to severely dilated, right ventricle moderately dilated, tricuspid valve mild-moderate regurgitation  . Chronic kidney disease (CKD),  stage II (mild)   . Nontraumatic retroperitoneal hematoma     Past Surgical History  Procedure Laterality Date  . Appendectomy  1962  . Dilation and curettage of uterus  1971     miscarrage, abnormal bleeding  . Dilation and curettage of uterus  1995    Menorrhagia  . Breast surgery  02/01    Quadrectomy Breast cancer ([name])  . Coronary artery bypass graft  02/09/02    X 4 ([name])  . Shoulder arthroscopy  05/04    Right, with rotator cuff debridement  . Hemorrhoid surgery  09/11/04    Binding Hassell Farrell)  . Cataract extraction  10/10 and 02/11  . Colostomy    . Partial colectomy    . Cardiac catheterization  2000    Iliac Stent ([name]) yearly follow-up  . Cardiac catheterization  01/25/02    High grade LAD disease, total of 3 vess, involvement  . Cardiac catheterization  01/13/07    SV graft to diag. occluded but good backfill o/w ok  . Cardiac catheterization  02/14/10    PTCA & stent vein graft to OM/RCA ([name])  . Cardiac catheterization  04/17/2011    Saphenous vein grafts to the RCA 100% occluded, to the OM 100% occluded, and the diagonal 100% occluded. Internal mammary artery to the LAD, widely patent.    Family History  Problem Relation Age of Onset  . Hypertension Mother   . Diabetes Mother   . Hypertension Father   . Heart disease Father 49    Heart attack  . Heart disease Brother     CHF  . Hypertension Brother   . COPD Brother     bronchial, frequent pneumonia  . Hypertension Brother   . Hyperlipidemia Brother   . Heart disease Brother     CABG  . Hypertension Brother   . Gout Brother   . Obesity Brother   . Diabetes Son   . Hypertension Son     History   Social History  . Marital Status: Married    Spouse Name: N/A    Number of Children: 2  . Years of Education: N/A   Occupational History  . Retired    Social History Main Topics  . Smoking status: Never Smoker   . Smokeless tobacco: Never Used  . Alcohol Use: No  . Drug  Use: No  . Sexual Activity: Not on file   Other Topics Concern  . Not on file   Social History Narrative   Retired 1991 from [employer] work   Widowed as of 04/2011 after 50+ years   Lives alone   2 kids in [place]   Enjoys travelling and reading    Review of systems: The patient specifically denies any chest pain at rest or with exertion, dyspnea  at rest or with exertion, orthopnea, paroxysmal nocturnal dyspnea, syncope, palpitations, focal neurological deficits, intermittent claudication, lower extremity edema, unexplained weight gain, cough, hemoptysis or wheezing.  The patient also denies abdominal pain, nausea, vomiting, dysphagia, diarrhea, constipation, polyuria, polydipsia, dysuria, hematuria, frequency, urgency, abnormal bleeding or bruising, fever, chills, unexpected weight changes, mood swings, change in skin or hair texture, change in voice quality, auditory or visual problems, allergic reactions or rashes, new musculoskeletal complaints other than usual "aches and pains".   PHYSICAL EXAM BP 137/86 mmHg  Pulse 65  Resp 16  Ht 5\' 5"  (1.651 m)  Wt 152 lb 12.8 oz (69.31 kg)  BMI 25.43 kg/m2 General: Alert, oriented x3, no distress Head: no evidence of trauma, PERRL, EOMI, no exophtalmos or lid lag, no myxedema, no xanthelasma; normal ears, nose and oropharynx Neck: normal jugular venous pulsations and no hepatojugular reflux; brisk carotid pulses without delay and no carotid bruits Chest: clear to auscultation, no signs of consolidation by percussion or palpation, normal fremitus, symmetrical and full respiratory excursions. Well-healed sternotomy scar Cardiovascular: normal position and quality of the apical impulse, regular rhythm, normal first and second heart sounds, no rubs or gallops. Grade 1/6 soft systolic murmur at the apex with limited radiation to axilla Abdomen: no tenderness or distention, no masses by palpation, no abnormal pulsatility or arterial  bruits, normal bowel sounds, no hepatosplenomegaly Extremities: no clubbing, cyanosis or edema; 2+ radial, ulnar and brachial pulses bilaterally; 2+ right femoral, posterior tibial and dorsalis pedis pulses; 2+ left femoral, posterior tibial and dorsalis pedis pulses; no subclavian or femoral bruits Prominent ecchymosis of left upper forearm, minimally tender Neurological: grossly nonfocal  EKG: NSR, poor R wave progression, ST depression and T wave inversion I and aVL (old)  Lipid Panel     Component Value Date/Time   CHOL 136 12/14/2013 0909   TRIG 115.0 12/14/2013 0909   HDL 44.40 12/14/2013 0909   CHOLHDL 3 12/14/2013 0909   VLDL 23.0 12/14/2013 0909   LDLCALC 69 12/14/2013 0909   LDLDIRECT 87.3 11/21/2008 1446    BMET    Component Value Date/Time   NA 141 12/14/2013 0909   K 4.9 12/14/2013 0909   CL 105 12/14/2013 0909   CO2 30 12/14/2013 0909   GLUCOSE 122* 12/14/2013 0909   BUN 26* 12/14/2013 0909   CREATININE 1.1 12/14/2013 0909   CREATININE 1.30* 02/16/2013 1229   CALCIUM 9.2 12/14/2013 0909   GFRNONAA 40* 10/11/2012 0720   GFRAA 46* 10/11/2012 0720     ASSESSMENT AND PLAN CORONARY ARTERY DISEASE Despite extensive native and graft coronary artery disease she is currently free of angina. She seems to have significantly benefited from treatment with ranolazine, even though she did not tolerate the full dose. Her most recent nuclear scan in 2011 showed evidence of ischemia in the territory of the left circumflex coronary artery as well as ischemia in the distal LAD distribution. Cardiac catheterization unfortunately did not show any options for revascularization. She was also seen at that time by Dr. Prescott Gum, and recommended medical management. Continue same medications.  Ischemic cardiomyopathy Without overt clinical heart failure  Atrial fibrillation Good arrhythmia control with sotalol. She cannot receive anticoagulants due to history of spontaneous perinephric  hematoma while on aspirin and Plavix. She also has repeated serious falls. The potential interaction between Ranexa and sotalol is duly noted, but she has tolerated this combination without any side effects, has a normal QT interval and is clearly benefiting a lot from both drugs.  Will continue low-dose aspirin alone for stroke prevention.  HYPERLIPIDEMIA Fair lipid levels, although ideally LDL<70.  HYPERTENSION Well controlled. She is receiving ACE inhibitor therapy for chronic diabetes related renal disease as well  Type 2 diabetes mellitus with vascular disease Her hemoglobin A1c is high. This is also likely responsible for her borderline high triglyceride levels. Greater attention to glucose control was recommended.  Orders Placed This Encounter  Procedures  . EKG 12-Lead   Patient Instructions  Dr. Sallyanne Kuster recommends that you schedule a follow-up appointment in: 6 months      Melita Villalona  Sanda Klein, MD, [facility] [phone] office [phone] pager

## 2014-10-23 ENCOUNTER — Other Ambulatory Visit: Payer: Self-pay | Admitting: Family Medicine

## 2014-10-25 ENCOUNTER — Emergency Department (HOSPITAL_COMMUNITY): Payer: Medicare Other

## 2014-10-25 ENCOUNTER — Encounter (HOSPITAL_COMMUNITY): Payer: Self-pay | Admitting: Neurology

## 2014-10-25 ENCOUNTER — Emergency Department (HOSPITAL_COMMUNITY)
Admission: EM | Admit: 2014-10-25 | Discharge: 2014-10-25 | Disposition: A | Discharge status: Home / Self Care | Payer: Medicare Other | Attending: Emergency Medicine | Admitting: Emergency Medicine

## 2014-10-25 DIAGNOSIS — Z9889 Other specified postprocedural states: Secondary | ICD-10-CM | POA: Insufficient documentation

## 2014-10-25 DIAGNOSIS — E785 Hyperlipidemia, unspecified: Secondary | ICD-10-CM | POA: Insufficient documentation

## 2014-10-25 DIAGNOSIS — R1084 Generalized abdominal pain: Secondary | ICD-10-CM

## 2014-10-25 DIAGNOSIS — Z7982 Long term (current) use of aspirin: Secondary | ICD-10-CM | POA: Insufficient documentation

## 2014-10-25 DIAGNOSIS — I2510 Atherosclerotic heart disease of native coronary artery without angina pectoris: Secondary | ICD-10-CM | POA: Diagnosis not present

## 2014-10-25 DIAGNOSIS — K435 Parastomal hernia without obstruction or  gangrene: Secondary | ICD-10-CM | POA: Diagnosis not present

## 2014-10-25 DIAGNOSIS — Z951 Presence of aortocoronary bypass graft: Secondary | ICD-10-CM | POA: Insufficient documentation

## 2014-10-25 DIAGNOSIS — N2889 Other specified disorders of kidney and ureter: Secondary | ICD-10-CM | POA: Diagnosis not present

## 2014-10-25 DIAGNOSIS — Z79899 Other long term (current) drug therapy: Secondary | ICD-10-CM | POA: Diagnosis not present

## 2014-10-25 DIAGNOSIS — I129 Hypertensive chronic kidney disease with stage 1 through stage 4 chronic kidney disease, or unspecified chronic kidney disease: Secondary | ICD-10-CM | POA: Diagnosis not present

## 2014-10-25 DIAGNOSIS — Z853 Personal history of malignant neoplasm of breast: Secondary | ICD-10-CM | POA: Insufficient documentation

## 2014-10-25 DIAGNOSIS — E119 Type 2 diabetes mellitus without complications: Secondary | ICD-10-CM | POA: Diagnosis not present

## 2014-10-25 DIAGNOSIS — Z8739 Personal history of other diseases of the musculoskeletal system and connective tissue: Secondary | ICD-10-CM | POA: Diagnosis not present

## 2014-10-25 DIAGNOSIS — N182 Chronic kidney disease, stage 2 (mild): Secondary | ICD-10-CM | POA: Diagnosis not present

## 2014-10-25 DIAGNOSIS — D259 Leiomyoma of uterus, unspecified: Secondary | ICD-10-CM | POA: Diagnosis not present

## 2014-10-25 DIAGNOSIS — Z90710 Acquired absence of both cervix and uterus: Secondary | ICD-10-CM | POA: Insufficient documentation

## 2014-10-25 DIAGNOSIS — K8020 Calculus of gallbladder without cholecystitis without obstruction: Secondary | ICD-10-CM | POA: Diagnosis not present

## 2014-10-25 DIAGNOSIS — Z9049 Acquired absence of other specified parts of digestive tract: Secondary | ICD-10-CM | POA: Insufficient documentation

## 2014-10-25 DIAGNOSIS — D509 Iron deficiency anemia, unspecified: Secondary | ICD-10-CM | POA: Diagnosis not present

## 2014-10-25 LAB — COMPREHENSIVE METABOLIC PANEL
ALBUMIN: 3.6 g/dL (ref 3.5–5.2)
ALK PHOS: 106 U/L (ref 39–117)
ALT: 12 U/L (ref 0–35)
AST: 22 U/L (ref 0–37)
Anion gap: 5 (ref 5–15)
BUN: 21 mg/dL (ref 6–23)
CHLORIDE: 102 meq/L (ref 96–112)
CO2: 31 mmol/L (ref 19–32)
CREATININE: 1.2 mg/dL — AB (ref 0.50–1.10)
Calcium: 9.2 mg/dL (ref 8.4–10.5)
GFR calc Af Amer: 49 mL/min — ABNORMAL LOW (ref >90)
GFR, EST NON AFRICAN AMERICAN: 42 mL/min — AB (ref >90)
GLUCOSE: 213 mg/dL — AB (ref 70–99)
POTASSIUM: 5.4 mmol/L — AB (ref 3.5–5.1)
Sodium: 138 mmol/L (ref 135–145)
TOTAL PROTEIN: 6.6 g/dL (ref 6.0–8.3)
Total Bilirubin: 0.7 mg/dL (ref 0.3–1.2)

## 2014-10-25 LAB — CBC WITH DIFFERENTIAL/PLATELET
BASOS ABS: 0 10*3/uL (ref 0.0–0.1)
Basophils Relative: 0 % (ref 0–1)
EOS PCT: 1 % (ref 0–5)
Eosinophils Absolute: 0.1 10*3/uL (ref 0.0–0.7)
HCT: 36.9 % (ref 36.0–46.0)
Hemoglobin: 11.8 g/dL — ABNORMAL LOW (ref 12.0–15.0)
LYMPHS ABS: 1.5 10*3/uL (ref 0.7–4.0)
Lymphocytes Relative: 23 % (ref 12–46)
MCH: 30.3 pg (ref 26.0–34.0)
MCHC: 32 g/dL (ref 30.0–36.0)
MCV: 94.9 fL (ref 78.0–100.0)
MONOS PCT: 16 % — AB (ref 3–12)
Monocytes Absolute: 1 10*3/uL (ref 0.1–1.0)
NEUTROS ABS: 3.9 10*3/uL (ref 1.7–7.7)
NEUTROS PCT: 60 % (ref 43–77)
PLATELETS: 313 10*3/uL (ref 150–400)
RBC: 3.89 MIL/uL (ref 3.87–5.11)
RDW: 14.6 % (ref 11.5–15.5)
WBC: 6.4 10*3/uL (ref 4.0–10.5)

## 2014-10-25 LAB — URINE MICROSCOPIC-ADD ON

## 2014-10-25 LAB — URINALYSIS, ROUTINE W REFLEX MICROSCOPIC
Bilirubin Urine: NEGATIVE
GLUCOSE, UA: NEGATIVE mg/dL
Hgb urine dipstick: NEGATIVE
Ketones, ur: NEGATIVE mg/dL
Nitrite: NEGATIVE
PROTEIN: NEGATIVE mg/dL
Specific Gravity, Urine: 1.018 (ref 1.005–1.030)
UROBILINOGEN UA: 1 mg/dL (ref 0.0–1.0)
pH: 7 (ref 5.0–8.0)

## 2014-10-25 LAB — LIPASE, BLOOD: Lipase: 29 U/L (ref 11–59)

## 2014-10-25 MED ORDER — IOHEXOL 300 MG/ML  SOLN
100.0000 mL | Freq: Once | INTRAMUSCULAR | Status: AC | PRN
  Administered 2014-10-25: 100 mL via INTRAVENOUS

## 2014-10-25 MED ORDER — ONDANSETRON 4 MG PO TBDP: 4.0000 mg | ORAL_TABLET | Freq: Three times a day (TID) | ORAL | Status: DC | PRN

## 2014-10-25 MED ORDER — SODIUM CHLORIDE 0.9 % IV BOLUS (SEPSIS)
500.0000 mL | Freq: Once | INTRAVENOUS | Status: AC
  Administered 2014-10-25: 500 mL via INTRAVENOUS

## 2014-10-25 MED ORDER — TRAMADOL HCL 50 MG PO TABS: 50.0000 mg | ORAL_TABLET | Freq: Four times a day (QID) | ORAL | Status: DC | PRN

## 2014-10-25 MED ORDER — SODIUM CHLORIDE 0.9 % IV SOLN: INTRAVENOUS | Status: DC

## 2014-10-25 NOTE — ED Notes (Signed)
Notified CT pt is done with contrast

## 2014-10-25 NOTE — Discharge Instructions (Signed)

## 2014-10-25 NOTE — ED Notes (Signed)
Pt reports lower abdominal pain since this morning that radiates to mid abd, vomiting, has colostomy in place.

## 2014-10-25 NOTE — ED Provider Notes (Signed)
TIME SEEN: 4:15 PM  CHIEF COMPLAINT: Abdominal pain  HPI: Pt is a 80 y.o. female with history of coronary artery disease stenosis CABG, hypertension, hyperlipidemia, prior history of right-sided breast cancer, atrial fibrillation, chronic kidney disease, prior appendectomy and history of perforated sigmoid diverticulitis in February 2012 resulting in partial colectomy and colostomy who presents emergency room and with pressure-like abdominal pain that is worse with standing upright and moving and better when she holds her abdomen tightly. She reports she's had decreased output from her colostomy bag in the past 2 days. She has had nausea and dry heaves but no vomiting. No fevers or chills. No sick contacts or recent travel. No dysuria or hematuria.  ROS: See HPI Constitutional: no fever  Eyes: no drainage  ENT: no runny nose   Cardiovascular:  no chest pain  Resp: no SOB  GI: no vomiting GU: no dysuria Integumentary: no rash  Allergy: no hives  Musculoskeletal: no leg swelling  Neurological: no slurred speech ROS otherwise negative  PAST MEDICAL HISTORY/PAST SURGICAL HISTORY:  Past Medical History  Diagnosis Date  . Anemia, iron deficiency   . History of breast cancer     Followed yearly by [facility]  . Coronary artery disease   . Diabetes mellitus, type 2   . Hyperlipemia   . Hypertension   . Osteoporosis   . Peripheral vascular disease   . 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 Hassell Done  . Diverticulitis   . Atrial fibrillation 11/28/2010    2D Echo - EF-30-35, left atrium moderate to severely dilated, right ventricle moderately dilated, tricuspid valve mild-moderate regurgitation  . Chronic kidney disease (CKD), stage II (mild)   . Nontraumatic retroperitoneal hematoma     MEDICATIONS:  Prior to Admission medications   Medication Sig Start Date End Date Taking? Authorizing Provider  aspirin EC 81 MG tablet Take 81 mg by mouth daily.   Yes Historical Provider, MD   atorvastatin (LIPITOR) 20 MG tablet TAKE 1 TABLET BY MOUTH AT BEDTIME 09/14/14  Yes Mihai Croitoru, MD  benazepril (LOTENSIN) 5 MG tablet Take 1 tablet (5 mg total) by mouth daily. 02/20/14  Yes Mihai Croitoru, MD  Cholecalciferol (VITAMIN D) 1000 UNITS capsule Take 1,000 Units by mouth 2 (two) times daily.     Yes Historical Provider, MD  ezetimibe (ZETIA) 10 MG tablet Take 1 tablet (10 mg total) by mouth every other day. 01/29/14  Yes Mihai Croitoru, MD  glyBURIDE (DIABETA) 5 MG tablet TAKE 2 TABLETS (10 MG TOTAL) BY MOUTH DAILY WITH BREAKFAST. 07/13/14  Yes Tonia Ghent, MD  meclizine (ANTIVERT) 25 MG tablet Take 1 tablet (25 mg total) by mouth daily as needed. For dizziness 08/25/13  Yes Mihai Croitoru, MD  Multiple Vitamin (MULTIVITAMIN) tablet Take 1 tablet by mouth daily.   Yes Historical Provider, MD  nitroGLYCERIN (NITROSTAT) 0.4 MG SL tablet Place 1 tablet (0.4 mg total) under the tongue as needed for chest pain. As directed 02/20/14  Yes Mihai Croitoru, MD  NOVOLOG 100 UNIT/ML injection USE PER SLIDING SCALE--200-250=4-5 UNITS, 251-300=5-6 UNITS,301-350=6-7 UNITS, WITH MEALS 08/31/14  Yes Tonia Ghent, MD  Omega-3 Fatty Acids (FISH OIL) 1000 MG CAPS Take 1 capsule by mouth daily.    Yes Historical Provider, MD  pioglitazone (ACTOS) 45 MG tablet TAKE 1 TABLET BY MOUTH DAILY. 03/02/14  Yes Tonia Ghent, MD  RANEXA 500 MG 12 hr tablet TAKE 1 TABLET BY MOUTH TWICE DAILY 10/08/14  Yes Mihai Croitoru,  MD  SOTALOL AF 80 MG TABS TAKE 1/2 TABLET BY MOUTH TWICE DAILY 10/08/14  Yes Mihai Croitoru, MD  vitamin B-12 (CYANOCOBALAMIN) 1000 MCG tablet Take 1,000 mcg by mouth daily.    Yes Historical Provider, MD  BD INSULIN SYRINGE ULTRAFINE 31G X 5/16" 0.3 ML MISC USE DAILY AND AS DIRECTED SLIDING SCALE 10/23/14   Tonia Ghent, MD  ONE TOUCH ULTRA TEST test strip USE AS DIRECTED TO CHECK BLOOD SUGAR 4 TIMES A DAY PER SLIDING SCALE 07/04/14   Tonia Ghent, MD    ALLERGIES:  Allergies  Allergen  Reactions  . Metformin And Related     Held due to renal function  . Zocor [Simvastatin] Other (See Comments)    Myalgias    SOCIAL HISTORY:  History  Substance Use Topics  . Smoking status: Never Smoker   . Smokeless tobacco: Never Used  . Alcohol Use: No    FAMILY HISTORY: Family History  Problem Relation Age of Onset  . Hypertension Mother   . Diabetes Mother   . Hypertension Father   . Heart disease Father 52    Heart attack  . Heart disease Brother     CHF  . Hypertension Brother   . COPD Brother     bronchial, frequent pneumonia  . Hypertension Brother   . Hyperlipidemia Brother   . Heart disease Brother     CABG  . Hypertension Brother   . Gout Brother   . Obesity Brother   . Diabetes Son   . Hypertension Son     EXAM: BP 184/76 mmHg  Pulse 59  Temp(Src) 97.8 F (36.6 C)  Resp 20  SpO2 97% CONSTITUTIONAL: Alert and oriented and responds appropriately to questions. Well-appearing; well-nourished HEAD: Normocephalic EYES: Conjunctivae clear, PERRL ENT: normal nose; no rhinorrhea; moist mucous membranes; pharynx without lesions noted NECK: Supple, no meningismus, no LAD  CARD: RRR; S1 and S2 appreciated; no murmurs, no clicks, no rubs, no gallops RESP: Normal chest excursion without splinting or tachypnea; breath sounds clear and equal bilaterally; no wheezes, no rhonchi, no rales,  ABD/GI: Hypoactive bowel sounds, abdomen is not distended and tympanitic, soft, no rebound, patient does have diffuse abdominal tenderness with some mild voluntary guarding, abdomen is nonsurgical and non-peritoneal, there is a colostomy in the left abdomen with minimal output, patient has a parastomal hernia that is easily reducible but mildly tender to palpation without surrounding erythema or warmth BACK:  The back appears normal and is non-tender to palpation, there is no CVA tenderness EXT: Normal ROM in all joints; non-tender to palpation; no edema; normal capillary refill;  no cyanosis    SKIN: Normal color for age and race; warm NEURO: Moves all extremities equally PSYCH: The patient's mood and manner are appropriate. Grooming and personal hygiene are appropriate.  MEDICAL DECISION MAKING: Patient here with abdominal pain concerning for possible small bowel obstruction. Labs unremarkable other than mildly elevated potassium of 5.4. EKG shows no interval changes. We'll hydrate with IV fluids for this hyperkalemia. I do not feel she needs calcium, bicarbonate her other intervention at this time. She is on a cardiac monitor. Denies chest pain or shortness of breath. We'll obtain a CT abdomen and pelvis for further evaluation. Patient declines pain and nausea medicine at this time.  ED PROGRESS: CT scan shows no acute abnormality other than her parastomal hernia. There is no sign of incarceration or strangulation or bowel inflammation. No bowel obstruction. Hernia is easily reducible.  Discussed with  patient I recommend close outpatient follow-up. We'll give surgery information. We'll give tramadol, Zofran to use as needed. Patient verbalizes understanding and is comfortable with plan.      EKG Interpretation  Date/Time:  Thursday October 25 2014 16:07:30 EST Ventricular Rate:  62 PR Interval:  177 QRS Duration: 72 QT Interval:  447 QTC Calculation: 476 R Axis:   -7 Text Interpretation:  Sinus rhythm Inferior infarct, old Consider anterior infarct Lateral leads are also involved No significant change since last tracing Jan 2014 Confirmed by [facility],  DO, Tyren Dugar [phone]) on 10/25/2014 4:17:13 PM        [place], DO 10/25/14 2333

## 2014-12-07 ENCOUNTER — Other Ambulatory Visit: Payer: Self-pay | Admitting: Cardiovascular Disease

## 2014-12-07 NOTE — Telephone Encounter (Signed)
Rx(s) sent to pharmacy electronically.  

## 2014-12-09 ENCOUNTER — Other Ambulatory Visit: Payer: Self-pay | Admitting: Cardiovascular Disease

## 2014-12-29 ENCOUNTER — Other Ambulatory Visit: Payer: Self-pay | Admitting: Cardiovascular Disease

## 2014-12-31 ENCOUNTER — Other Ambulatory Visit (INDEPENDENT_AMBULATORY_CARE_PROVIDER_SITE_OTHER): Payer: Medicare Other

## 2014-12-31 ENCOUNTER — Other Ambulatory Visit: Payer: Self-pay | Admitting: Family Medicine

## 2014-12-31 ENCOUNTER — Telehealth: Payer: Self-pay | Admitting: Family Medicine

## 2014-12-31 DIAGNOSIS — E785 Hyperlipidemia, unspecified: Secondary | ICD-10-CM | POA: Diagnosis not present

## 2014-12-31 DIAGNOSIS — N183 Chronic kidney disease, stage 3 unspecified: Secondary | ICD-10-CM

## 2014-12-31 DIAGNOSIS — I10 Essential (primary) hypertension: Secondary | ICD-10-CM

## 2014-12-31 DIAGNOSIS — E559 Vitamin D deficiency, unspecified: Secondary | ICD-10-CM

## 2014-12-31 DIAGNOSIS — E1159 Type 2 diabetes mellitus with other circulatory complications: Secondary | ICD-10-CM

## 2014-12-31 DIAGNOSIS — E1151 Type 2 diabetes mellitus with diabetic peripheral angiopathy without gangrene: Secondary | ICD-10-CM

## 2014-12-31 LAB — COMPREHENSIVE METABOLIC PANEL
ALBUMIN: 3.7 g/dL (ref 3.5–5.2)
ALK PHOS: 68 U/L (ref 39–117)
ALT: 11 U/L (ref 0–35)
AST: 16 U/L (ref 0–37)
BUN: 29 mg/dL — ABNORMAL HIGH (ref 6–23)
CALCIUM: 9.5 mg/dL (ref 8.4–10.5)
CHLORIDE: 101 meq/L (ref 96–112)
CO2: 32 meq/L (ref 19–32)
Creatinine, Ser: 1.15 mg/dL (ref 0.40–1.20)
GFR: 48.57 mL/min — ABNORMAL LOW (ref >60.00)
GLUCOSE: 140 mg/dL — AB (ref 70–99)
Potassium: 4.9 mEq/L (ref 3.5–5.1)
Sodium: 136 mEq/L (ref 135–145)
TOTAL PROTEIN: 6.7 g/dL (ref 6.0–8.3)
Total Bilirubin: 0.4 mg/dL (ref 0.2–1.2)

## 2014-12-31 LAB — HEMOGLOBIN A1C: Hgb A1c MFr Bld: 8.8 % — ABNORMAL HIGH (ref 4.6–6.5)

## 2014-12-31 LAB — LIPID PANEL
Cholesterol: 136 mg/dL (ref 0–200)
HDL: 37.7 mg/dL — ABNORMAL LOW (ref >39.00)
LDL Cholesterol: 73 mg/dL (ref 0–99)
NONHDL: 98.3
Total CHOL/HDL Ratio: 4
Triglycerides: 128 mg/dL (ref 0.0–149.0)
VLDL: 25.6 mg/dL (ref 0.0–40.0)

## 2014-12-31 LAB — VITAMIN D 25 HYDROXY (VIT D DEFICIENCY, FRACTURES): VITD: 38.54 ng/mL (ref 30.00–100.00)

## 2014-12-31 NOTE — Telephone Encounter (Signed)
Rx refill sent to patient pharmacy   

## 2014-12-31 NOTE — Telephone Encounter (Signed)
emmi emailed °

## 2015-01-01 ENCOUNTER — Other Ambulatory Visit: Payer: Self-pay | Admitting: Family Medicine

## 2015-01-02 ENCOUNTER — Other Ambulatory Visit: Payer: Self-pay | Admitting: Family Medicine

## 2015-01-03 ENCOUNTER — Ambulatory Visit (INDEPENDENT_AMBULATORY_CARE_PROVIDER_SITE_OTHER): Payer: Medicare Other | Admitting: Family Medicine

## 2015-01-03 ENCOUNTER — Encounter: Payer: Self-pay | Admitting: Family Medicine

## 2015-01-03 VITALS — BP 142/72 | HR 87 | Temp 97.6°F | Ht 63.25 in | Wt 152.8 lb

## 2015-01-03 DIAGNOSIS — I10 Essential (primary) hypertension: Secondary | ICD-10-CM

## 2015-01-03 DIAGNOSIS — Z1211 Encounter for screening for malignant neoplasm of colon: Secondary | ICD-10-CM

## 2015-01-03 DIAGNOSIS — E785 Hyperlipidemia, unspecified: Secondary | ICD-10-CM | POA: Diagnosis not present

## 2015-01-03 DIAGNOSIS — Z0000 Encounter for general adult medical examination without abnormal findings: Secondary | ICD-10-CM

## 2015-01-03 DIAGNOSIS — E1159 Type 2 diabetes mellitus with other circulatory complications: Secondary | ICD-10-CM

## 2015-01-03 DIAGNOSIS — Z7189 Other specified counseling: Secondary | ICD-10-CM

## 2015-01-03 DIAGNOSIS — I255 Ischemic cardiomyopathy: Secondary | ICD-10-CM

## 2015-01-03 DIAGNOSIS — E119 Type 2 diabetes mellitus without complications: Secondary | ICD-10-CM

## 2015-01-03 DIAGNOSIS — Z23 Encounter for immunization: Secondary | ICD-10-CM | POA: Diagnosis not present

## 2015-01-03 MED ORDER — MECLIZINE HCL 25 MG PO TABS: 25.0000 mg | ORAL_TABLET | Freq: Every day | ORAL | Status: DC | PRN

## 2015-01-03 MED ORDER — INSULIN ASPART 100 UNIT/ML ~~LOC~~ SOLN: SUBCUTANEOUS | Status: DC

## 2015-01-03 MED ORDER — GLYBURIDE 5 MG PO TABS: ORAL_TABLET | ORAL | Status: DC

## 2015-01-03 MED ORDER — PIOGLITAZONE HCL 45 MG PO TABS: 45.0000 mg | ORAL_TABLET | Freq: Every day | ORAL | Status: DC

## 2015-01-03 NOTE — Patient Instructions (Addendum)
Call your pharmacy and ask them to make your refills not automatic.  That way you can call for refills when needed.   Don't change your meds but work on not "cheating" your diet.  Recheck in about 4 months.  Labs ahead of the visit.  Go to the lab on the way out.  We'll contact you with your lab report (stool cards). Take care.  Glad to see you.

## 2015-01-03 NOTE — Progress Notes (Signed)
Pre visit review using our clinic review tool, if applicable. No additional management support is needed unless otherwise documented below in the visit note.  I have personally reviewed the Medicare Annual Wellness questionnaire and have noted 1. The patient's medical and social history 2. Their use of alcohol, tobacco or illicit drugs 3. Their current medications and supplements 4. The patient's functional ability including ADL's, fall risks, home safety risks and hearing or visual             impairment. 5. Diet and physical activities 6. Evidence for depression or mood disorders  The patients weight, height, BMI have been recorded in the chart and visual acuity is per eye clinic.  I have made referrals, counseling and provided education to the patient based review of the above and I have provided the pt with a written personalized care plan for preventive services.  Provider list updated- see scanned forms.  Routine anticipatory guidance given to patient.  See health maintenance.  Flu 2015 Shingles 2007 PNA 2007 Tetanus 2012 D/w patient [id number] for colon cancer screening, including IFOB vs. colonoscopy.  Risks and benefits of both were discussed and patient voiced understanding.  Pt elects [id number].  Breast cancer screening- 2015 DXA d/w pt.  She declined further treatment.  Advance directive- son and daughter equally designated if patient were incapacitated.  Cognitive function addressed- see scanned forms- and if abnormal then additional documentation follows.   Diabetes:  Using medications without difficulties:yes Hypoglycemic episodes: very rare Hyperglycemic episodes:no Feet problems: no Blood Sugars averaging: ~100 in AM eye exam within last year: yes A1c d/w pt.  Goal A1c ~8 to limit risk of hypoglycemia.   Hypertension:    Using medication without problems or lightheadedness: yes Chest pain with exertion:no Edema:occ Short of breath: only with walking up hills,  does well with stairs w/o SOB  Elevated Cholesterol: Using medications without problems:yes Muscle aches: no Diet compliance:yes Exercise: as tolerated  She has noted some irritation from recent pollen in the environment.  Her back continues to be bothersome "but I just ignore it." O/w feeling well.   PMH and SH reviewed  Meds, vitals, and allergies reviewed.   ROS: See HPI.  Otherwise negative.    GEN: nad, alert and oriented HEENT: mucous membranes moist, OP wnl NECK: supple w/o LA, scant UAN noted (likely from pollen exposure, see above) CV: rrr. PULM: ctab, no inc wob ABD: soft, +bs EXT: trace edema SKIN: no acute rash

## 2015-01-04 DIAGNOSIS — Z7189 Other specified counseling: Secondary | ICD-10-CM | POA: Insufficient documentation

## 2015-01-04 DIAGNOSIS — Z0000 Encounter for general adult medical examination without abnormal findings: Secondary | ICD-10-CM | POA: Insufficient documentation

## 2015-01-04 NOTE — Assessment & Plan Note (Signed)
Reasonable control, continue as is.  I don't want to induce hypotension.  She agrees with plan.

## 2015-01-04 NOTE — Assessment & Plan Note (Signed)
Controlled, she'll continue statin as is.

## 2015-01-04 NOTE — Assessment & Plan Note (Signed)
Flu 2015 Shingles 2007 PNA 2007 Tetanus 2012 D/w patient [id number] for colon cancer screening, including IFOB vs. colonoscopy.  Risks and benefits of both were discussed and patient voiced understanding.  Pt elects HEK:BTCY.  Breast cancer screening- 2015 DXA d/w pt.  She declined further treatment.  Advance directive- son and daughter equally designated if patient were incapacitated.  Cognitive function addressed- see scanned forms- and if abnormal then additional documentation follows.

## 2015-01-04 NOTE — Assessment & Plan Note (Signed)
I wouldn't up her meds at this point.  D/w pt.  Uncontrolled, but with goal ~8 for A1c.  She'll work more on diet in the meantime.  rxs sent.  She agrees.

## 2015-01-08 ENCOUNTER — Other Ambulatory Visit: Payer: Self-pay | Admitting: Family Medicine

## 2015-01-09 ENCOUNTER — Encounter: Payer: Self-pay | Admitting: Family Medicine

## 2015-01-09 ENCOUNTER — Ambulatory Visit (INDEPENDENT_AMBULATORY_CARE_PROVIDER_SITE_OTHER): Payer: Medicare Other | Admitting: Family Medicine

## 2015-01-09 VITALS — BP 130/80 | HR 66 | Temp 97.5°F | Wt 153.5 lb

## 2015-01-09 DIAGNOSIS — I255 Ischemic cardiomyopathy: Secondary | ICD-10-CM

## 2015-01-09 DIAGNOSIS — M542 Cervicalgia: Secondary | ICD-10-CM

## 2015-01-09 NOTE — Progress Notes (Signed)
Pre visit review using our clinic review tool, if applicable. No additional management support is needed unless otherwise documented below in the visit note.  She fell Friday night.  Was at a friend's house.  She slipped on a mat on the floor.  She remembers hitting the floor, went down, forward onto a soft mat.  No LOC.  Neck and L foot were sore at the time.  Neck pain continues.  Pain with neck ROM.  Sleeping sitting up.  R scalp/ear pain noted, occ.  Her foot pain is resolved except for some mild pain near the ball of the L foot- the foot pain is clearly better in the meantime. She has used some liniment on her neck with some relief, last night.   Slept better last night.    Meds, vitals, and allergies reviewed.   ROS: See HPI.  Otherwise, noncontributory.  nad ncat TM wnl B Neck supple, slight pain with ROM but able to twist to L and R, flex and ext.  No midline neck pain, does have B paraspinal C spine muscle tightness Not a stiff neck rrr ctab Back not ttp in midline Foot not ttp but some bruising noted on the L 1-3 toes dorsally. Normal ROM of L toes, able to bear weight.

## 2015-01-09 NOTE — Assessment & Plan Note (Addendum)
Looks to be a muscle strain.  No need to image at this point.  Okay for outpatient f/u. She may have occipital nerve irritation, causing the R scalp pain.  D/w pt about heat, liniment, tylenol as needed.  Should resolve.  D/w pt about fall cautions.   Her foot finding appear to be incidental and resolving, w/o need for other intervention. She agrees.

## 2015-01-09 NOTE — Patient Instructions (Signed)
This looks like a muscle strain in your neck.  Use a heating pad and then the liniment.  Take two extra strength tylenol up to 3 times a day for pain.  This should gradually get better over the next 1-2 weeks.  Take care.

## 2015-01-17 ENCOUNTER — Other Ambulatory Visit (INDEPENDENT_AMBULATORY_CARE_PROVIDER_SITE_OTHER): Payer: Medicare Other

## 2015-01-17 DIAGNOSIS — Z1211 Encounter for screening for malignant neoplasm of colon: Secondary | ICD-10-CM | POA: Diagnosis not present

## 2015-01-17 LAB — FECAL OCCULT BLOOD, IMMUNOCHEMICAL: FECAL OCCULT BLD: NEGATIVE

## 2015-01-21 ENCOUNTER — Encounter: Payer: Self-pay | Admitting: *Deleted

## 2015-01-31 ENCOUNTER — Other Ambulatory Visit: Payer: Self-pay | Admitting: Cardiovascular Disease

## 2015-01-31 NOTE — Telephone Encounter (Signed)
Rx refill sent to patient pharmacy   

## 2015-02-08 ENCOUNTER — Telehealth: Payer: Self-pay | Admitting: Cardiovascular Disease

## 2015-02-08 NOTE — Telephone Encounter (Signed)
Instructed to go with and stay with Sotalo AF

## 2015-02-08 NOTE — Telephone Encounter (Signed)
Vicente Males is calling from [facility] with a question about a prescription(Sotalol AF 80mg ) . Last month she received the regular Sotalol and not the AF . Please call to clarify . Thanks

## 2015-02-18 ENCOUNTER — Other Ambulatory Visit: Payer: Self-pay | Admitting: Cardiovascular Disease

## 2015-03-05 ENCOUNTER — Telehealth: Payer: Self-pay | Admitting: *Deleted

## 2015-03-05 NOTE — Telephone Encounter (Signed)
-----   Message from Tammi Sou, [place] sent at 02/28/2015  4:43 PM EDT ----- Pt returned your call, Rollene Fare and Terri Skains were out of office so I advise pt she is due for her mammogram f/u. Pt said she didn't schedule it yet but she will call them directly tomorrow and get the appt scheduled. ----- Message -----    From: Pete Pelt, LPN    Sent: 01/29/622   4:09 PM      To: Josetta Huddle, CMA  Left message on answering machine at home number to call back. ----- Message -----    From: Tonia Ghent, MD    Sent: 02/22/2015  12:11 AM      To: Josetta Huddle, CMA  Please call to make sure she has her f/u mammogram scheduled.  Due this summer. Thanks.   Brigitte Pulse

## 2015-03-11 DIAGNOSIS — X32XXXA Exposure to sunlight, initial encounter: Secondary | ICD-10-CM | POA: Diagnosis not present

## 2015-03-11 DIAGNOSIS — L570 Actinic keratosis: Secondary | ICD-10-CM | POA: Diagnosis not present

## 2015-03-11 DIAGNOSIS — D2239 Melanocytic nevi of other parts of face: Secondary | ICD-10-CM | POA: Diagnosis not present

## 2015-03-11 DIAGNOSIS — D485 Neoplasm of uncertain behavior of skin: Secondary | ICD-10-CM | POA: Diagnosis not present

## 2015-03-11 DIAGNOSIS — I788 Other diseases of capillaries: Secondary | ICD-10-CM | POA: Diagnosis not present

## 2015-03-11 DIAGNOSIS — D225 Melanocytic nevi of trunk: Secondary | ICD-10-CM | POA: Diagnosis not present

## 2015-03-11 DIAGNOSIS — D2262 Melanocytic nevi of left upper limb, including shoulder: Secondary | ICD-10-CM | POA: Diagnosis not present

## 2015-04-04 ENCOUNTER — Telehealth: Payer: Self-pay | Admitting: Family Medicine

## 2015-04-04 NOTE — Telephone Encounter (Signed)
[facility] called and pt was in for regular mammogram, and provider is suggesting she needs a diagnostic mammogram.  Can you please fax an order for a diagnostic mammogram? Pt would like to be seen next week for this. The fax number to facility is [phone], thanks.

## 2015-04-05 NOTE — Telephone Encounter (Signed)
Order faxed.

## 2015-04-05 NOTE — Telephone Encounter (Signed)
Please fax order for diagnostic mammogram with B ultrasound if needed.  Thanks.

## 2015-04-19 DIAGNOSIS — R928 Other abnormal and inconclusive findings on diagnostic imaging of breast: Secondary | ICD-10-CM | POA: Diagnosis not present

## 2015-04-19 DIAGNOSIS — Z9889 Other specified postprocedural states: Secondary | ICD-10-CM | POA: Diagnosis not present

## 2015-04-19 DIAGNOSIS — Z853 Personal history of malignant neoplasm of breast: Secondary | ICD-10-CM | POA: Diagnosis not present

## 2015-04-19 DIAGNOSIS — R922 Inconclusive mammogram: Secondary | ICD-10-CM | POA: Diagnosis not present

## 2015-04-19 LAB — HM MAMMOGRAPHY: HM Mammogram: NORMAL

## 2015-04-22 ENCOUNTER — Encounter: Payer: Self-pay | Admitting: Family Medicine

## 2015-05-01 ENCOUNTER — Other Ambulatory Visit (INDEPENDENT_AMBULATORY_CARE_PROVIDER_SITE_OTHER): Payer: Medicare Other

## 2015-05-01 DIAGNOSIS — E119 Type 2 diabetes mellitus without complications: Secondary | ICD-10-CM

## 2015-05-01 LAB — HEMOGLOBIN A1C: Hgb A1c MFr Bld: 8.2 % — ABNORMAL HIGH (ref 4.6–6.5)

## 2015-05-07 ENCOUNTER — Ambulatory Visit: Payer: Medicare Other | Admitting: Family Medicine

## 2015-05-07 ENCOUNTER — Ambulatory Visit (INDEPENDENT_AMBULATORY_CARE_PROVIDER_SITE_OTHER): Payer: Medicare Other | Admitting: Family Medicine

## 2015-05-07 ENCOUNTER — Encounter: Payer: Self-pay | Admitting: Family Medicine

## 2015-05-07 VITALS — BP 158/74 | HR 67 | Temp 97.8°F | Wt 153.0 lb

## 2015-05-07 DIAGNOSIS — E1151 Type 2 diabetes mellitus with diabetic peripheral angiopathy without gangrene: Secondary | ICD-10-CM

## 2015-05-07 DIAGNOSIS — I255 Ischemic cardiomyopathy: Secondary | ICD-10-CM | POA: Diagnosis not present

## 2015-05-07 DIAGNOSIS — E1159 Type 2 diabetes mellitus with other circulatory complications: Secondary | ICD-10-CM

## 2015-05-07 NOTE — Progress Notes (Signed)
Pre visit review using our clinic review tool, if applicable. No additional management support is needed unless otherwise documented below in the visit note.  Diabetes:  Using medications without difficulties:yes Hypoglycemic episodes:not usually.  Rare lows, if prolonged fasting and occ at night.  D/w pt about a snack before bed.   Hyperglycemic episodes:no Feet problems:no Blood Sugars averaging: usually ~100 in the AMs eye exam within last year:yes A1c okay at 8.2.  D/w pt about her goal A1c ~8.   She feels good.   She has f/u with cards tomorrow.    Meds, vitals, and allergies reviewed.   ROS: See HPI.  Otherwise negative.    GEN: nad, alert and oriented HEENT: mucous membranes moist NECK: supple w/o LA CV: rrr. PULM: ctab, no inc wob ABD: soft, +bs EXT: no edema SKIN: no acute rash  Diabetic foot exam: Normal inspection No skin breakdown No calluses  Normal DP pulses Normal sensation to light touch and monofilament Nails normal

## 2015-05-07 NOTE — Patient Instructions (Signed)
Recheck labs before a visit in about 3 months.  Take care.  Glad to see you.  Don't change your meds for now.

## 2015-05-08 ENCOUNTER — Ambulatory Visit (INDEPENDENT_AMBULATORY_CARE_PROVIDER_SITE_OTHER): Payer: Medicare Other | Admitting: Cardiovascular Disease

## 2015-05-08 VITALS — BP 146/70 | HR 74 | Resp 16 | Ht 66.0 in | Wt 153.0 lb

## 2015-05-08 DIAGNOSIS — Z79899 Other long term (current) drug therapy: Secondary | ICD-10-CM

## 2015-05-08 DIAGNOSIS — E1151 Type 2 diabetes mellitus with diabetic peripheral angiopathy without gangrene: Secondary | ICD-10-CM

## 2015-05-08 DIAGNOSIS — I25708 Atherosclerosis of coronary artery bypass graft(s), unspecified, with other forms of angina pectoris: Secondary | ICD-10-CM | POA: Diagnosis not present

## 2015-05-08 DIAGNOSIS — I255 Ischemic cardiomyopathy: Secondary | ICD-10-CM

## 2015-05-08 DIAGNOSIS — I10 Essential (primary) hypertension: Secondary | ICD-10-CM

## 2015-05-08 DIAGNOSIS — I480 Paroxysmal atrial fibrillation: Secondary | ICD-10-CM

## 2015-05-08 DIAGNOSIS — E1159 Type 2 diabetes mellitus with other circulatory complications: Secondary | ICD-10-CM

## 2015-05-08 NOTE — Patient Instructions (Signed)
Your physician wants you to follow-up in: 6 Months. You will receive a reminder letter in the mail two months in advance. If you don't receive a letter, please call our office to schedule the follow-up appointment.  Your physician recommends that you return for lab work in: [facility] in [place]

## 2015-05-08 NOTE — Assessment & Plan Note (Signed)
Reasonable control, continue as is.  Recheck labs before a visit in about 3 months.  A1c and plans d/w pt.  She agrees.

## 2015-05-08 NOTE — Progress Notes (Signed)
Patient ID: [name], female   DOB: July 04, 1935, 80 y.o.   MRN: [id number]     Cardiology Office Note   Date:  05/11/2015   ID:  [name], DOB 1934-10-07, MRN [id number]  PCP:  [name], MD  Cardiologist:   Sanda Klein, MD   Chief Complaint  Patient presents with  . Follow-up  . Shortness of Breath    a little      History of Present Illness: [name] is a 80 y.o. female who presents for  Follow-up for coronary artery disease status post previous bypass surgery with exertional angina , paroxysmal atrial fibrillation on sotalol therapy , not on anticoagulation due to history of spontaneous retroperitoneal hematoma, treated hypertension, hyperlipidemia and diabetes mellitus complicated by chronic kidney disease stage III.   She has done quite well since her last appointment and her only cardiovascular complaint is occasional palpitations. She denies dyspnea and angina. She has not had syncope and denies lower showed edema or significant dizziness. Ranexa has had a substantial impact with virtual abolition of exertional angina pectoris. May also be helping with a reduction in the burden of atrial fibrillation.  She has not had any recent falls.  [name] is a 80 year old woman with a history of coronary disease, now 11 years status post post bypass surgery. (total occlusion 3 saphenous vein grafts, patent LIMA to LAD, total occlusion of the major OM artery and the right coronary artery with some collateral filling).  She has a history of paroxysmal atrial fibrillation. While on treatment with aspirin and Plavix she developed a spontaneous retroperitoneal hematoma. She is therefore not taking anticoagulants. Moderately depressed left ventricular systolic function with an ejection fraction of 35%, but she does not have much in the way of symptoms of congestive heart failure. She has a mild to moderate degree of mitral insufficiency. She has a moderate to severely dilated left  atrium. Ranexa has had excellent positive impact on her angina. Despite also taking sotalol she has never shown QT interval prolongation. Also has type 2 diabetes mellitus, mixed hyperlipidemia and systemic hypertension. She has had several serious falls and injuries due to poor balance, not syncope. She still lives alone  Past Medical History  Diagnosis Date  . Anemia, iron deficiency   . History of breast cancer     Followed yearly by [facility]  . Coronary artery disease   . Diabetes mellitus, type 2   . Hyperlipemia   . Hypertension   . Osteoporosis   . Peripheral vascular disease   . 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 Hassell Done  . Diverticulitis   . Atrial fibrillation 11/28/2010    2D Echo - EF-30-35, left atrium moderate to severely dilated, right ventricle moderately dilated, tricuspid valve mild-moderate regurgitation  . Chronic kidney disease (CKD), stage II (mild)   . Nontraumatic retroperitoneal hematoma     Past Surgical History  Procedure Laterality Date  . Appendectomy  1962  . Dilation and curettage of uterus  1971     miscarrage, abnormal bleeding  . Dilation and curettage of uterus  1995    Menorrhagia  . Breast surgery  02/01    Quadrectomy Breast cancer (Dr. Hassell Done)  . Coronary artery bypass graft  02/09/02    X 4 ([place])  . Shoulder arthroscopy  05/04    Right, with rotator cuff debridement  . Hemorrhoid surgery  09/11/04    Binding Hassell Done)  . Cataract extraction  10/10  and 02/11  . Colostomy    . Partial colectomy    . Cardiac catheterization  2000    Iliac Stent (Dr. Juanita Craver) yearly follow-up  . Cardiac catheterization  01/25/02    High grade LAD disease, total of 3 vess, involvement  . Cardiac catheterization  01/13/07    SV graft to diag. occluded but good backfill o/w ok  . Cardiac catheterization  02/14/10    PTCA & stent vein graft to OM/RCA ([place])  . Cardiac catheterization  04/17/2011    Saphenous vein grafts to the RCA 100% occluded, to  the OM 100% occluded, and the diagonal 100% occluded. Internal mammary artery to the LAD, widely patent.     Current Outpatient Prescriptions  Medication Sig Dispense Refill  . aspirin EC 81 MG tablet Take 81 mg by mouth daily.    Marland Kitchen atorvastatin (LIPITOR) 20 MG tablet TAKE 1 TABLET BY MOUTH AT BEDTIME 90 tablet 3  . BD INSULIN SYRINGE ULTRAFINE 31G X 5/16" 0.3 ML MISC USE DAILY AND AS DIRECTED SLIDING SCALE 100 each 3  . benazepril (LOTENSIN) 5 MG tablet TAKE 1 TABLET (5 MG TOTAL) BY MOUTH DAILY. 90 tablet 1  . Cholecalciferol (VITAMIN D) 1000 UNITS capsule Take 1,000 Units by mouth 2 (two) times daily.      Marland Kitchen glucose blood (ONE TOUCH ULTRA TEST) test strip Use to check blood sugar 4 times a day.  Insulin-dependent.  Diagnosis:  E11.9 100 each 5  . glyBURIDE (DIABETA) 5 MG tablet TAKE 2 TABLETS (10 MG TOTAL) BY MOUTH DAILY WITH BREAKFAST. 180 tablet 3  . insulin aspart (NOVOLOG) 100 UNIT/ML injection USE PER SLIDING SCALE--200-250=4-5 UNITS, 251-300=5-6 UNITS,301-350=6-7 UNITS, WITH MEALS 10 mL 11  . meclizine (ANTIVERT) 25 MG tablet Take 1 tablet (25 mg total) by mouth daily as needed. For dizziness 30 tablet 6  . Multiple Vitamin (MULTIVITAMIN) tablet Take 1 tablet by mouth daily.    . nitroGLYCERIN (NITROSTAT) 0.4 MG SL tablet Place 1 tablet (0.4 mg total) under the tongue as needed for chest pain. As directed 25 tablet 11  . Omega-3 Fatty Acids (FISH OIL) 1000 MG CAPS Take 1 capsule by mouth daily.     . pioglitazone (ACTOS) 45 MG tablet Take 1 tablet (45 mg total) by mouth daily. 90 tablet 3  . RANEXA 500 MG 12 hr tablet TAKE 1 TABLET BY MOUTH TWICE DAILY 60 tablet 6  . SOTALOL AF 80 MG TABS TAKE 1/2 TABLET BY MOUTH TWICE DAILY 30 tablet 5  . vitamin B-12 (CYANOCOBALAMIN) 1000 MCG tablet Take 1,000 mcg by mouth daily.     Marland Kitchen ZETIA 10 MG tablet TAKE 1 TABLET BY MOUTH EVERY OTHER DAY. 30 tablet 9   No current facility-administered medications for this visit.    Allergies:   Metformin and  related and Zocor    Social History:  The patient  reports that she has never smoked. She has never used smokeless tobacco. She reports that she does not drink alcohol or use illicit drugs.   Family History:  The patient's family history includes COPD in her brother; Diabetes in her mother and son; Gout in her brother; Heart disease in her brother, brother, and mother; Heart disease (age of onset: 62) in her father; Hyperlipidemia in her brother; Hypertension in her brother, brother, brother, father, mother, and son; Obesity in her brother.    ROS:  Please see the history of present illness.    Otherwise, review of systems positive for none.  All other systems are reviewed and negative.    PHYSICAL EXAM: VS:  BP 146/70 mmHg  Pulse 74  Resp 16  Ht 5\' 6"  (1.676 m)  Wt 153 lb (69.4 kg)  BMI 24.71 kg/m2 , BMI Body mass index is 24.71 kg/(m^2).  General: Alert, oriented x3, no distress Head: no evidence of trauma, PERRL, EOMI, no exophtalmos or lid lag, no myxedema, no xanthelasma; normal ears, nose and oropharynx Neck: normal jugular venous pulsations and no hepatojugular reflux; brisk carotid pulses without delay and no carotid bruits Chest: clear to auscultation, no signs of consolidation by percussion or palpation, normal fremitus, symmetrical and full respiratory excursions, Well-healed sternotomy scar Cardiovascular: normal position and quality of the apical impulse, regular rhythm, normal first and second heart sounds, no rubs or gallops. Grade 1/6 soft systolic murmur at the apex with limited radiation to axillaAbdomen: no tenderness or distention, no masses by palpation, no abnormal pulsatility or arterial bruits, normal bowel sounds, no hepatosplenomegaly Extremities: no clubbing, cyanosis or edema; 2+ radial, ulnar and brachial pulses bilaterally; 2+ right femoral, posterior tibial and dorsalis pedis pulses; 2+ left femoral, posterior tibial and dorsalis pedis pulses; no subclavian  or femoral bruits Neurological: grossly nonfocal Psych: euthymic mood, full affect   EKG:  EKG is ordered today. The ekg ordered today demonstrates  Normal sinus rhythm , no repolarization abnormalities, QTC 439 ms   Recent Labs: 10/25/2014: Hemoglobin 11.8*; Platelets 313 12/31/2014: ALT 11; BUN 29*; Creatinine, Ser 1.15; Potassium 4.9; Sodium 136    Lipid Panel    Component Value Date/Time   CHOL 136 12/31/2014 1028   TRIG 128.0 12/31/2014 1028   HDL 37.70* 12/31/2014 1028   CHOLHDL 4 12/31/2014 1028   VLDL 25.6 12/31/2014 1028   LDLCALC 73 12/31/2014 1028   LDLDIRECT 87.3 11/21/2008 1446      Wt Readings from Last 3 Encounters:  05/08/15 153 lb (69.4 kg)  05/07/15 153 lb (69.4 kg)  01/09/15 153 lb 8 oz (69.627 kg)     ASSESSMENT AND PLAN:  CORONARY ARTERY DISEASE Despite extensive native and graft coronary artery disease she is currently free of angina. She seems to have significantly benefited from treatment with ranolazine, even though she did not tolerate the full dose. Her most recent nuclear scan in 2011 showed evidence of ischemia in the territory of the left circumflex coronary artery as well as ischemia in the distal LAD distribution. Cardiac catheterization unfortunately did not show any options for revascularization  Either with surgery or percutaneous means.Continue same medications.  Ischemic cardiomyopathy Without overt clinical heart failure  Atrial fibrillation Good arrhythmia control with sotalol. She cannot receive anticoagulants due to history of spontaneous perinephric hematoma while on aspirin and Plavix. She also has repeated serious falls. The potential interaction between Ranexa and sotalol is duly noted, but she has tolerated this combination without any side effects, has a normal QT interval and is clearly benefiting a lot from both drugs. Will continue low-dose aspirin alone for stroke prevention.  HYPERLIPIDEMIA Fair lipid levels, target  LDL<70.  HYPERTENSION Well controlled. She is receiving ACE inhibitor therapy for chronic diabetes related renal disease as well  Type 2 diabetes mellitus with vascular disease Her hemoglobin A1c is high. This is also likely responsible for her borderline high triglyceride levels. Greater attention to glucose control was recommended.    Current medicines are reviewed at length with the patient today.  The patient does not have concerns regarding medicines.  The following changes have been made:  no change  Labs/ tests ordered today include:  Orders Placed This Encounter  Procedures  . Basic Metabolic Panel (BMET)    Patient Instructions  Your physician wants you to follow-up in: 6 Months. You will receive a reminder letter in the mail two months in advance. If you don't receive a letter, please call our office to schedule the follow-up appointment.  Your physician recommends that you return for lab work in: [facility] in [place], [place]  05/11/2015 6:19 PM    Sanda Klein, MD, [facility] [phone] office [phone] pager

## 2015-05-11 ENCOUNTER — Encounter: Payer: Self-pay | Admitting: Cardiovascular Disease

## 2015-05-31 ENCOUNTER — Other Ambulatory Visit: Payer: Self-pay | Admitting: Family Medicine

## 2015-06-13 ENCOUNTER — Other Ambulatory Visit: Payer: Self-pay | Admitting: Cardiovascular Disease

## 2015-06-13 NOTE — Telephone Encounter (Signed)
Rx request sent to pharmacy.  

## 2015-06-27 ENCOUNTER — Encounter: Payer: Self-pay | Admitting: Family Medicine

## 2015-06-27 DIAGNOSIS — H16223 Keratoconjunctivitis sicca, not specified as Sjogren's, bilateral: Secondary | ICD-10-CM | POA: Diagnosis not present

## 2015-06-27 LAB — HM DIABETES EYE EXAM

## 2015-07-04 ENCOUNTER — Other Ambulatory Visit: Payer: Self-pay | Admitting: Family Medicine

## 2015-07-09 ENCOUNTER — Other Ambulatory Visit: Payer: Self-pay | Admitting: Cardiovascular Disease

## 2015-07-09 NOTE — Telephone Encounter (Signed)
Rx(s) sent to pharmacy electronically.  

## 2015-07-29 DIAGNOSIS — Z79899 Other long term (current) drug therapy: Secondary | ICD-10-CM | POA: Diagnosis not present

## 2015-07-30 LAB — BASIC METABOLIC PANEL
BUN: 20 mg/dL (ref 7–25)
CALCIUM: 9.1 mg/dL (ref 8.6–10.4)
CHLORIDE: 99 mmol/L (ref 98–110)
CO2: 27 mmol/L (ref 20–31)
Creat: 1 mg/dL — ABNORMAL HIGH (ref 0.60–0.93)
Glucose, Bld: 174 mg/dL — ABNORMAL HIGH (ref 65–99)
Potassium: 4.4 mmol/L (ref 3.5–5.3)
Sodium: 138 mmol/L (ref 135–146)

## 2015-08-02 ENCOUNTER — Other Ambulatory Visit: Payer: Self-pay | Admitting: Family Medicine

## 2015-08-02 DIAGNOSIS — E1159 Type 2 diabetes mellitus with other circulatory complications: Secondary | ICD-10-CM

## 2015-08-06 ENCOUNTER — Other Ambulatory Visit (INDEPENDENT_AMBULATORY_CARE_PROVIDER_SITE_OTHER): Payer: Medicare Other

## 2015-08-06 DIAGNOSIS — E1159 Type 2 diabetes mellitus with other circulatory complications: Secondary | ICD-10-CM | POA: Diagnosis not present

## 2015-08-06 LAB — HEMOGLOBIN A1C: Hgb A1c MFr Bld: 8.4 % — ABNORMAL HIGH (ref 4.6–6.5)

## 2015-08-10 ENCOUNTER — Other Ambulatory Visit: Payer: Self-pay | Admitting: Cardiovascular Disease

## 2015-08-13 ENCOUNTER — Encounter: Payer: Self-pay | Admitting: Family Medicine

## 2015-08-13 ENCOUNTER — Ambulatory Visit (INDEPENDENT_AMBULATORY_CARE_PROVIDER_SITE_OTHER): Payer: Medicare Other | Admitting: Family Medicine

## 2015-08-13 VITALS — BP 138/62 | HR 77 | Temp 98.0°F | Wt 152.2 lb

## 2015-08-13 DIAGNOSIS — Z23 Encounter for immunization: Secondary | ICD-10-CM

## 2015-08-13 DIAGNOSIS — I255 Ischemic cardiomyopathy: Secondary | ICD-10-CM

## 2015-08-13 DIAGNOSIS — E1159 Type 2 diabetes mellitus with other circulatory complications: Secondary | ICD-10-CM

## 2015-08-13 MED ORDER — INSULIN ASPART 100 UNIT/ML ~~LOC~~ SOLN: SUBCUTANEOUS | Status: DC

## 2015-08-13 NOTE — Progress Notes (Signed)
Diabetes:  Using medications without difficulties:yes Hypoglycemic episodes:no Hyperglycemic episodes:yes, rarely up to 400 (once after eating at [facility] Tuesday) Feet problems: no Blood Sugars averaging: usually ~80s-120s in the AM.  She has post prandial elevations.  eye exam within last year: yes A1c 8.4.  We talked about diet and her sliding scale adjustment.    She has some burning with urination recently.  Resolved now.  She cut out carbonated drinks in the meantime.  No sx now.  D/w pt.  Sx likely from choice of drinks, not UTI.   Meds, vitals, and allergies reviewed.   ROS: See HPI.  Otherwise negative.    GEN: nad, alert and oriented HEENT: mucous membranes moist NECK: supple w/o LA CV: rrr. PULM: ctab, no inc wob ABD: soft, +bs EXT: trace BLE edema SKIN: no acute rash

## 2015-08-13 NOTE — Patient Instructions (Signed)
Take care.  Glad to see you.  Recheck labs before a physical in about 4 months.   Labs ahead of time.  Let me know if you keep having high sugars in the meantime.

## 2015-08-14 NOTE — Assessment & Plan Note (Signed)
Sugar usually ~80s-120s in the AM.  She has post prandial elevations.  A1c 8.4.  We talked about diet and her sliding scale adjustment.   See AVS and med list. Recheck in a few months.   She'll work more on diet.  I don't want to make sudden/drastic changes to her dosing.  She'll update me if concerns in the meantime.

## 2015-10-01 ENCOUNTER — Other Ambulatory Visit: Payer: Self-pay | Admitting: Cardiovascular Disease

## 2015-10-01 NOTE — Telephone Encounter (Signed)
E-sent to pharmacy , x 2 refills need appointment

## 2015-11-11 ENCOUNTER — Ambulatory Visit (INDEPENDENT_AMBULATORY_CARE_PROVIDER_SITE_OTHER): Payer: Medicare Other | Admitting: Cardiovascular Disease

## 2015-11-11 ENCOUNTER — Encounter: Payer: Self-pay | Admitting: Cardiovascular Disease

## 2015-11-11 VITALS — BP 134/80 | HR 62 | Ht 65.0 in | Wt 148.2 lb

## 2015-11-11 DIAGNOSIS — E785 Hyperlipidemia, unspecified: Secondary | ICD-10-CM

## 2015-11-11 DIAGNOSIS — I10 Essential (primary) hypertension: Secondary | ICD-10-CM

## 2015-11-11 DIAGNOSIS — I5032 Chronic diastolic (congestive) heart failure: Secondary | ICD-10-CM | POA: Diagnosis not present

## 2015-11-11 DIAGNOSIS — E1159 Type 2 diabetes mellitus with other circulatory complications: Secondary | ICD-10-CM

## 2015-11-11 DIAGNOSIS — I25708 Atherosclerosis of coronary artery bypass graft(s), unspecified, with other forms of angina pectoris: Secondary | ICD-10-CM

## 2015-11-11 DIAGNOSIS — I739 Peripheral vascular disease, unspecified: Secondary | ICD-10-CM

## 2015-11-11 DIAGNOSIS — I480 Paroxysmal atrial fibrillation: Secondary | ICD-10-CM | POA: Diagnosis not present

## 2015-11-11 DIAGNOSIS — N183 Chronic kidney disease, stage 3 unspecified: Secondary | ICD-10-CM

## 2015-11-11 DIAGNOSIS — I4891 Unspecified atrial fibrillation: Secondary | ICD-10-CM

## 2015-11-11 NOTE — Progress Notes (Signed)
Patient ID: [name], female   DOB: 1935-08-07, 81 y.o.   MRN: [id number]    Cardiology Office Note    Date:  11/11/2015   ID:  KMYA BURGHARDT, DOB November 01, 1934, MRN [id number]  PCP:  [name], MD  Cardiologist:   Sanda Klein, MD   Chief Complaint  Patient presents with  . Follow-up     no chest pain, has little shortness of breath, has edema, occassional cramping in legs, no lightheadedness or dizziness    History of Present Illness:  [name] is a 81 y.o. female who presents for Follow-up for coronary artery disease status post previous bypass surgery with exertional angina , paroxysmal atrial fibrillation on sotalol therapy , not on anticoagulation due to history of spontaneous retroperitoneal hematoma, treated hypertension, hyperlipidemia and diabetes mellitus complicated by chronic kidney disease stage III.  She has not had any bleeding problems since her last appointment and has not been troubled by palpitations.. She denies exertional angina, but does describe exertional dyspnea after climbing one flight of stairs as well as occasional mild ankle edema that resolves if she keeps her legs up. She has not had syncope or significant dizziness. She has not had any recent falls. She had a brief emergency room visit for abdominal pain with concern for strangulated hernia, but the problem resolved spontaneously. She is still struggling to achieve optimal glycemic control with her most recent A1c 8.4%. She will soon be due a repeat lipid profile  [name] is a 81 year old woman with a history of coronary disease, now 11 years status post post bypass surgery. (total occlusion 3 saphenous vein grafts, patent LIMA to LAD, total occlusion of the major OM artery and the right coronary artery with some collateral filling). Ranexa has had a substantial impact with virtual abolition of exertional angina pectoris.  She has a history of paroxysmal atrial fibrillation. While on treatment with  aspirin and Plavix she developed a spontaneous retroperitoneal hematoma. She is therefore not taking anticoagulants. Ranexa may also be helping with a reduction in the burden of atrial fibrillation, without signs of QT interval prolongation. She has a moderate to severely dilated left atrium. Most recent EF assessment was >60% by scintigraphy and LV angiography in 2012, NYHA class II symptoms of congestive heart failure. She has a mild to moderate degree of mitral insufficiency.   Also has type 2 diabetes mellitus, mixed hyperlipidemia and systemic hypertension. She has had several serious falls and injuries due to poor balance, not syncope. She still lives alone  Past Medical History  Diagnosis Date  . Anemia, iron deficiency   . History of breast cancer     Followed yearly by [facility]  . Coronary artery disease   . Diabetes mellitus, type 2 ([place])   . Hyperlipemia   . Hypertension   . Osteoporosis   . Peripheral vascular disease ([place])   . Cancer [facility])     R beast cancer followed by Dr. Hassell Done  . Diverticulitis   . Atrial fibrillation ([place]) 11/28/2010    2D Echo - EF-30-35, left atrium moderate to severely dilated, right ventricle moderately dilated, tricuspid valve mild-moderate regurgitation  . Chronic kidney disease (CKD), stage II (mild)   . Nontraumatic retroperitoneal hematoma     Past Surgical History  Procedure Laterality Date  . Appendectomy  1962  . Dilation and curettage of uterus  1971     miscarrage, abnormal bleeding  . Dilation and curettage of uterus  1995  Menorrhagia  . Breast surgery  02/01    Quadrectomy Breast cancer (Dr. Hassell Done)  . Coronary artery bypass graft  02/09/02    X 4 ([place])  . Shoulder arthroscopy  05/04    Right, with rotator cuff debridement  . Hemorrhoid surgery  09/11/04    Binding Hassell Done)  . Cataract extraction  10/10 and 02/11  . Colostomy    . Partial colectomy    . Cardiac catheterization  2000    Iliac Stent (Dr. Juanita Craver) yearly  follow-up  . Cardiac catheterization  01/25/02    High grade LAD disease, total of 3 vess, involvement  . Cardiac catheterization  01/13/07    SV graft to diag. occluded but good backfill o/w ok  . Cardiac catheterization  02/14/10    PTCA & stent vein graft to OM/RCA ([place])  . Cardiac catheterization  04/17/2011    Saphenous vein grafts to the RCA 100% occluded, to the OM 100% occluded, and the diagonal 100% occluded. Internal mammary artery to the LAD, widely patent.    Outpatient Prescriptions Prior to Visit  Medication Sig Dispense Refill  . aspirin EC 81 MG tablet Take 81 mg by mouth daily.    Marland Kitchen atorvastatin (LIPITOR) 20 MG tablet TAKE 1 TABLET BY MOUTH AT BEDTIME 90 tablet 3  . BD INSULIN SYRINGE ULTRAFINE 31G X 5/16" 0.3 ML MISC USE DAILY AND AS DIRECTED SLIDING SCALE 100 each 3  . benazepril (LOTENSIN) 5 MG tablet TAKE 1 TABLET (5 MG TOTAL) BY MOUTH DAILY. 90 tablet 2  . Cholecalciferol (VITAMIN D) 1000 UNITS capsule Take 1,000 Units by mouth 2 (two) times daily.      Marland Kitchen glyBURIDE (DIABETA) 5 MG tablet TAKE 2 TABLETS (10 MG TOTAL) BY MOUTH DAILY WITH BREAKFAST. 180 tablet 3  . insulin aspart (NOVOLOG) 100 UNIT/ML injection USE PER SLIDING SCALE--200-250=6 UNITS, 251-300=7 UNITS,301-350=8 UNITS, WITH MEALS 10 mL 11  . meclizine (ANTIVERT) 25 MG tablet Take 1 tablet (25 mg total) by mouth daily as needed. For dizziness 30 tablet 6  . Multiple Vitamin (MULTIVITAMIN) tablet Take 1 tablet by mouth daily.    . nitroGLYCERIN (NITROSTAT) 0.4 MG SL tablet Place 1 tablet (0.4 mg total) under the tongue as needed for chest pain. As directed 25 tablet 11  . Omega-3 Fatty Acids (FISH OIL) 1000 MG CAPS Take 1 capsule by mouth daily.     . ONE TOUCH ULTRA TEST test strip USE TO CHECK BLOOD SUGAR 4 TIMES A DAY. INSULIN-DEPENDENT. DIAGNOSIS: E11.9 100 each 5  . pioglitazone (ACTOS) 45 MG tablet Take 1 tablet (45 mg total) by mouth daily. 90 tablet 3  . ranolazine (RANEXA) 500 MG 12 hr tablet Take 1  tablet (500 mg total) by mouth 2 (two) times daily. Need appointment for more refills 60 tablet 2  . SOTALOL AF 80 MG TABS TAKE 1/2 TABLET BY MOUTH TWICE DAILY 30 tablet 5  . vitamin B-12 (CYANOCOBALAMIN) 1000 MCG tablet Take 1,000 mcg by mouth daily.     Marland Kitchen ZETIA 10 MG tablet TAKE 1 TABLET BY MOUTH EVERY OTHER DAY. 30 tablet 9   No facility-administered medications prior to visit.     Allergies:   Metformin and related and Zocor   Social History   Social History  . Marital Status: Married    Spouse Name: N/A  . Number of Children: 2  . Years of Education: N/A   Occupational History  . Retired    Social History Main Topics  .  Smoking status: Never Smoker   . Smokeless tobacco: Never Used  . Alcohol Use: No  . Drug Use: No  . Sexual Activity: Not Asked   Other Topics Concern  . None   Social History Narrative   Retired 1991 from [employer] work   Widowed as of 04/2011 after 50+ years   Lives alone   2 kids in [place]   Enjoys travelling and reading     Family History:  The patient's family history includes COPD in her brother; Diabetes in her mother and son; Gout in her brother; Heart disease in her brother, brother, and mother; Heart disease (age of onset: 6) in her father; Hyperlipidemia in her brother; Hypertension in her brother, brother, brother, father, mother, and son; Obesity in her brother.   ROS:   Please see the history of present illness.    ROS All other systems reviewed and are negative.   PHYSICAL EXAM:   VS:  BP 134/80 mmHg  Pulse 62  Ht 5\' 5"  (1.651 m)  Wt 67.217 kg (148 lb 3 oz)  BMI 24.66 kg/m2   GEN: Well nourished, well developed, in no acute distress HEENT: normal Neck: no JVD, carotid bruits, or masses Cardiac: RRR; Grade 1/6 soft systolic murmur at the apex with limited radiation to axilla, no diastolic murmurs, rubs, or gallops, no edema , healed sternotomy scar Respiratory:  clear to auscultation bilaterally, normal work of  breathing GI: soft, nontender, nondistended, + BS. Large left lower quadrant nonreducible hernia at site of colostomy MS: no deformity or atrophy Skin: warm and dry, no rash Neuro:  Alert and Oriented x 3, Strength and sensation are intact Psych: euthymic mood, full affect  Wt Readings from Last 3 Encounters:  11/11/15 67.217 kg (148 lb 3 oz)  08/13/15 69.037 kg (152 lb 3.2 oz)  05/08/15 69.4 kg (153 lb)      Studies/Labs Reviewed:   EKG:  EKG is ordered today.  The ekg ordered today demonstrates sinus rhythm, QTC 423 ms  Recent Labs: 12/31/2014: ALT 11 07/29/2015: BUN 20; Creat 1.00*; Potassium 4.4; Sodium 138   Lipid Panel    Component Value Date/Time   CHOL 136 12/31/2014 1028   TRIG 128.0 12/31/2014 1028   HDL 37.70* 12/31/2014 1028   CHOLHDL 4 12/31/2014 1028   VLDL 25.6 12/31/2014 1028   LDLCALC 73 12/31/2014 1028   LDLDIRECT 87.3 11/21/2008 1446    Additional studies/ records that were reviewed today include:  Records from Dr. Damita Dunnings    ASSESSMENT:    1. Chronic diastolic congestive heart failure ([place])   2. Coronary artery disease involving coronary bypass graft of native heart with other forms of angina pectoris (HCC)   3. Paroxysmal atrial fibrillation ([place])   4. Hyperlipidemia   5. Type 2 diabetes mellitus with vascular disease ([place])   6. Peripheral vascular disease ([place])   7. Essential hypertension   8. Chronic kidney disease, stage III (moderate)      PLAN:  In order of problems listed above:  1. CHF: She describes NYHA functional class II exertional dyspnea, climbing stairs, but does not have any limitations in activities of daily living. She does not show overt signs of hypervolemia and would prefer to keep off diuretics if possible since she is taking sotalol. Note that she is taking Actos for diabetes mellitus. If signs/symptoms of heart failure worsened this medication will need to be stopped and would recommend repeat echocardiography. 2. CAD:  No angina on Ranexa. She  is essentially dependent on the LIMA to LAD bypass graft, with occlusion of all 3 vein grafts as well as the native oblique marginal in native right coronary artery. There were no good options for either percutaneous or surgical revascularization when she last had a heart catheterization in 2012. 3. PAF: Very well controlled symptomatically on sotalol and Ranexa. QT interval is not even prolonged. Review the need to discuss drug interactions when she gets new prescriptions. Note that she cannot be anticoagulated due to history of spontaneous severe retroperitoneal bleeding requiring transfusion when she was on combination aspirin and clopidogrel. On aspirin only. CHADSVasc 7 (age 49, gender, diabetes, hypertension, heart failure, vascular disease). Thankfully no history of stroke or TIA 4. HLP: All lipid parameters in satisfactory range 5. DM: Not yet well controlled, she is working on it. She is on insulin. May have to discontinue Actos if heart failure symptoms progress 6. PAD: Currently asymptomatic 7. HTN: Good blood pressure control on current medications 8. CKD: Most recent creatinine was 1.0, which actually now places her in stage II kidney disease    Medication Adjustments/Labs and Tests Ordered: Current medicines are reviewed at length with the patient today.  Concerns regarding medicines are outlined above.  Medication changes, Labs and Tests ordered today are listed in the Patient Instructions below. Patient Instructions  Medication Instructions:   NO CHANGE  Labwork:  Your physician recommends that you return for lab work WHEN FASTING  Follow-Up:  Your physician wants you to follow-up in: [place] will receive a reminder letter in the mail two months in advance. If you don't receive a letter, please call our office to schedule the follow-up appointment.   If you need a refill on your cardiac medications before your next appointment,  please call your pharmacy.         Mikael Spray, MD  11/11/2015 9:20 AM    [facility] [address], [place], [place]  [address] Phone: [phone]; Fax: [phone]

## 2015-11-11 NOTE — Patient Instructions (Signed)
Medication Instructions:   NO CHANGE  Labwork:  Your physician recommends that you return for lab work WHEN FASTING  Follow-Up:  Your physician wants you to follow-up in: [place] will receive a reminder letter in the mail two months in advance. If you don't receive a letter, please call our office to schedule the follow-up appointment.   If you need a refill on your cardiac medications before your next appointment, please call your pharmacy.

## 2015-12-09 ENCOUNTER — Other Ambulatory Visit (INDEPENDENT_AMBULATORY_CARE_PROVIDER_SITE_OTHER): Payer: Medicare Other

## 2015-12-09 DIAGNOSIS — E785 Hyperlipidemia, unspecified: Secondary | ICD-10-CM

## 2015-12-09 DIAGNOSIS — E1159 Type 2 diabetes mellitus with other circulatory complications: Secondary | ICD-10-CM | POA: Diagnosis not present

## 2015-12-09 LAB — HEMOGLOBIN A1C: HEMOGLOBIN A1C: 8.4 % — AB (ref 4.6–6.5)

## 2015-12-09 LAB — COMPREHENSIVE METABOLIC PANEL
ALK PHOS: 67 U/L (ref 39–117)
ALT: 10 U/L (ref 0–35)
AST: 15 U/L (ref 0–37)
Albumin: 3.7 g/dL (ref 3.5–5.2)
BUN: 20 mg/dL (ref 6–23)
CALCIUM: 9.2 mg/dL (ref 8.4–10.5)
CHLORIDE: 103 meq/L (ref 96–112)
CO2: 32 meq/L (ref 19–32)
Creatinine, Ser: 1.02 mg/dL (ref 0.40–1.20)
GFR: 55.65 mL/min — AB (ref >60.00)
GLUCOSE: 143 mg/dL — AB (ref 70–99)
Potassium: 4.9 mEq/L (ref 3.5–5.1)
Sodium: 140 mEq/L (ref 135–145)
Total Bilirubin: 0.4 mg/dL (ref 0.2–1.2)
Total Protein: 6.4 g/dL (ref 6.0–8.3)

## 2015-12-09 LAB — LIPID PANEL
CHOLESTEROL: 134 mg/dL (ref 0–200)
HDL: 42.6 mg/dL (ref >39.00)
LDL Cholesterol: 71 mg/dL (ref 0–99)
NonHDL: 91.73
TRIGLYCERIDES: 102 mg/dL (ref 0.0–149.0)
Total CHOL/HDL Ratio: 3
VLDL: 20.4 mg/dL (ref 0.0–40.0)

## 2015-12-12 ENCOUNTER — Ambulatory Visit (INDEPENDENT_AMBULATORY_CARE_PROVIDER_SITE_OTHER): Payer: Medicare Other | Admitting: Family Medicine

## 2015-12-12 ENCOUNTER — Encounter: Payer: Self-pay | Admitting: Family Medicine

## 2015-12-12 VITALS — BP 162/76 | HR 70 | Temp 97.5°F | Wt 148.0 lb

## 2015-12-12 DIAGNOSIS — E1159 Type 2 diabetes mellitus with other circulatory complications: Secondary | ICD-10-CM | POA: Diagnosis not present

## 2015-12-12 MED ORDER — INSULIN GLARGINE 100 UNIT/ML ~~LOC~~ SOLN: SUBCUTANEOUS | Status: DC

## 2015-12-12 MED ORDER — "INSULIN SYRINGE-NEEDLE U-100 31G X 5/16"" 0.3 ML MISC": Status: DC

## 2015-12-12 MED ORDER — NYSTATIN 100000 UNIT/GM EX POWD: CUTANEOUS | Status: DC

## 2015-12-12 MED ORDER — NITROGLYCERIN 0.4 MG SL SUBL
0.4000 mg | SUBLINGUAL_TABLET | SUBLINGUAL | Status: DC | PRN
Start: 2015-12-12 — End: 2019-01-19

## 2015-12-12 NOTE — Patient Instructions (Addendum)
Stop actos. Add on lantus.  5 units at night.  If AM sugar is above 140, then add a unit.  If less than 90, then cut back 1 unit.  If AM 90-140, then continue the same dose at the day before.   Update me next week, about your swelling and sugar/lantus dose.   I'll talk to cardiology in the meantime.  Take care.  Glad to see you.  Plan on a recheck in 3 months, labs ahead of time, but we'll likely be making plans in the meantime.

## 2015-12-12 NOTE — Progress Notes (Signed)
Pre visit review using our clinic review tool, if applicable. No additional management support is needed unless otherwise documented below in the visit note.  Recheck pulse recheck 95%.   More BLE edema.  On actos.  D/w pt about changes in meds.  Will d/w cards about possible echo.    No exertional chest pain.  More BLE edema recently.  Labs d/w pt.  More SOB recently but some better today and she was willing to give this a few more days to see if getting off actos would help.  She has been sleeping with at 2-3 pillows. She was prev sleeping flatly, but more recently needing some incline.  Her weight is down recently.    She continues to have neck and back pain, likely orthopedic on origin.    Sugars have still been elevated, but she has been working on her diet more recently.    Meds, vitals, and allergies reviewed.   ROS: See HPI.  Otherwise, noncontributory.  GEN: nad, alert and oriented HEENT: mucous membranes moist NECK: supple w/o LA CV: rrr.  PULM: ctab, no inc wob ABD: soft, +bs EXT: trace BLE edema in stockings.

## 2015-12-13 ENCOUNTER — Telehealth: Payer: Self-pay | Admitting: *Deleted

## 2015-12-13 MED ORDER — INSULIN DETEMIR 100 UNIT/ML ~~LOC~~ SOLN: SUBCUTANEOUS | Status: DC

## 2015-12-13 NOTE — Telephone Encounter (Signed)
Change to levemir. Sent. No change in the sig.  Thanks.

## 2015-12-13 NOTE — Assessment & Plan Note (Signed)
Stop actos.  Will change to lantus.  D/w pt.   Start with 5 units at night, add 1 unit per day until AM sugar <140.  She'll update me next week.  Still okay for outpatient f/u.  Will notify cards and ask for input re: echo.  I didn't change her meds o/w.   She doesn't have chest pain.  She isn't SOB at the [place] and appears okay for outpatient f/u.  dw pt about her back pain, that is to the R of midline in the back, along the paraspinal muscles and likely MSK origin, d/w pt about tylenol use in meantime.  >25 minutes spent in face to face time with patient, >50% spent in counselling or coordination of care.

## 2015-12-13 NOTE — Telephone Encounter (Signed)
PA received for Lantus 100 units/ML vial.  Phoned [facility].  Patient must try all 3 of these before the plan will pay for Lantus:  Basaglar, Levemir, Tyler Aas.  Please advise and re-send Rx.

## 2015-12-17 ENCOUNTER — Other Ambulatory Visit: Payer: Self-pay | Admitting: Family Medicine

## 2015-12-19 ENCOUNTER — Telehealth: Payer: Self-pay | Admitting: Family Medicine

## 2015-12-19 NOTE — Telephone Encounter (Signed)
Please get update from patient re: sugar and lantus dose, and also re: her swelling.  If her swelling is not better by the end of the month, would repeat the echocardiogram. Cardiac clinic would be glad to schedule it if that's the case, ie if needed.  Thanks.

## 2015-12-19 NOTE — Telephone Encounter (Signed)
Patient says she didn't get started until Tuesday night because the pharmacy didn't hve her medication.  Took 7 units last night, this morning BS ws 111.  Will take 7 units again tonight.  Throughout the day, blood sugars are better.  Swelling seems to be doing better and patient will call again next week to give update after she has been on this regimen for longer.

## 2015-12-20 NOTE — Telephone Encounter (Signed)
Noted. Thanks.

## 2015-12-25 ENCOUNTER — Other Ambulatory Visit: Payer: Self-pay | Admitting: Cardiovascular Disease

## 2015-12-25 NOTE — Telephone Encounter (Signed)
REFILL 

## 2015-12-26 ENCOUNTER — Telehealth: Payer: Self-pay | Admitting: *Deleted

## 2015-12-26 DIAGNOSIS — R0600 Dyspnea, unspecified: Secondary | ICD-10-CM

## 2015-12-26 DIAGNOSIS — I5032 Chronic diastolic (congestive) heart failure: Secondary | ICD-10-CM

## 2015-12-26 NOTE — Telephone Encounter (Signed)
Patient called in to give update as requested by Dr. Damita Dunnings.  BS Log on Levemir:   DATE  READINGS UNITS OF LEVEMIR 12/19/15    7 units 3/17   89  6 units 3/18   186  7 units 3/19   148  8 units 3/20   72  6 units 3/21   113  5 units 3/22   67  4 units 3/23   78  Patient spoke with me last Thursday 12/19/15 and started her log from then to now.  Patient says her swelling is maybe not as bad but still present especially if she is standing or sitting for any length of time during the day.

## 2015-12-27 ENCOUNTER — Telehealth: Payer: Self-pay | Admitting: Cardiovascular Disease

## 2015-12-27 NOTE — Telephone Encounter (Addendum)
Thanks for the update.  I think this is doing the right thing with insulin titration.  If she has an AM sugar <90, then I would cut back by 1 unit on the next dose.  Ex give 3 units tomorrow since she had sugar of 78 yesterday.    I'll route this to cardiology currently for consideration of echo. I appreciate cards input and help.  Thanks.

## 2015-12-27 NOTE — Addendum Note (Signed)
Addended by: Diana Eves on: 12/27/2015 01:16 PM   Modules accepted: Orders

## 2015-12-27 NOTE — Telephone Encounter (Signed)
Will go ahead and order an echo and will keep you posted on results. Thank you. Chelley, can you please order for diastolic CHF/dyspnea? Sanda Klein, MD

## 2015-12-27 NOTE — Telephone Encounter (Signed)
Spoke with Cheley and she tried calling patient, will forward

## 2015-12-27 NOTE — Telephone Encounter (Signed)
Tried calling, no answer or VM

## 2015-12-27 NOTE — Telephone Encounter (Signed)
lmtcb

## 2015-12-27 NOTE — Telephone Encounter (Signed)
Follow Up ° °Pt called back. Please call  °

## 2015-12-27 NOTE — Telephone Encounter (Signed)
Acknowledged. Returned patient call.

## 2015-12-27 NOTE — Telephone Encounter (Signed)
Called patient with recommendations. Will send message to schedulers to call patient on Monday to schedule echocardiogram. Patient in agreement and verbalized understanding.

## 2015-12-27 NOTE — Telephone Encounter (Signed)
Left detailed message on voicemail.  

## 2016-01-01 ENCOUNTER — Other Ambulatory Visit: Payer: Self-pay | Admitting: Cardiovascular Disease

## 2016-01-01 ENCOUNTER — Other Ambulatory Visit: Payer: Self-pay | Admitting: Family Medicine

## 2016-01-01 DIAGNOSIS — H26492 Other secondary cataract, left eye: Secondary | ICD-10-CM | POA: Diagnosis not present

## 2016-01-01 NOTE — Telephone Encounter (Signed)
Rx refill sent to pharmacy. 

## 2016-01-03 ENCOUNTER — Telehealth: Payer: Self-pay | Admitting: Family Medicine

## 2016-01-03 NOTE — Telephone Encounter (Signed)
Left detailed message on voicemail (per DPR) 

## 2016-01-03 NOTE — Telephone Encounter (Signed)
Agreed, recheck BP is reasonable at home.  Please call her to make sure he has her meds, still taking them.  If BP still that high, then needs eval w/o waiting for routine OV next week.

## 2016-01-03 NOTE — Telephone Encounter (Signed)
Patient Name: TAWAN ZACCHEO DOB: May 15, 1935 Initial Comment Caller states her blood pressure is reading high. 190/90 Nurse Assessment Nurse: Vallery Sa, RN, Cathy Date/Time (Eastern Time): 01/03/2016 2:39:35 PM Confirm and document reason for call. If symptomatic, describe symptoms. You must click the next button to save text entered. ---Caller states her blood pressure was 190/90 this morning. No injury in the past 3 days. No headache. Alert and responsive. Has the patient traveled out of the country within the last 30 days? ---No Does the patient have any new or worsening symptoms? ---Yes Will a triage be completed? ---Yes Related visit to physician within the last 2 weeks? ---Yes Does the PT have any chronic conditions? (i.e. diabetes, asthma, etc.) ---Yes List chronic conditions. ---Diabetes, High Blood Pressure, Heart problems, CHF Is this a behavioral health or substance abuse call? ---No Guidelines Guideline Title Affirmed Question Affirmed Notes High Blood Pressure [1] BP # 200/120 AND [2] having NO cardiac or neurologic symptoms Final Disposition User See Physician within 4 Hours (or PCP triage) Vallery Sa, RN, Cathy Comments Caller plans to rest at home, recheck her blood pressure and possibly go to urgent care facility. Referrals GO TO FACILITY OTHER - SPECIFY

## 2016-01-08 ENCOUNTER — Other Ambulatory Visit: Payer: Self-pay | Admitting: Cardiovascular Disease

## 2016-01-08 NOTE — Telephone Encounter (Signed)
Rx refill sent to pharmacy. 

## 2016-01-10 ENCOUNTER — Other Ambulatory Visit: Payer: Self-pay | Admitting: Family Medicine

## 2016-01-15 ENCOUNTER — Telehealth: Payer: Self-pay | Admitting: Family Medicine

## 2016-01-15 NOTE — Telephone Encounter (Signed)
Lm for pt to sch AWV on 6/7 at 9am, in addition to lab appt., mn

## 2016-01-15 NOTE — Telephone Encounter (Signed)
Patient returned [name]'s call.  Patient scheduled appointment with Katha Cabal on 03/11/16 at 9:00.

## 2016-01-16 ENCOUNTER — Other Ambulatory Visit (HOSPITAL_COMMUNITY): Payer: Medicare Other

## 2016-01-31 ENCOUNTER — Other Ambulatory Visit: Payer: Self-pay

## 2016-01-31 ENCOUNTER — Ambulatory Visit (HOSPITAL_COMMUNITY): Payer: Medicare Other | Attending: Cardiovascular Disease

## 2016-01-31 DIAGNOSIS — I5032 Chronic diastolic (congestive) heart failure: Secondary | ICD-10-CM

## 2016-01-31 DIAGNOSIS — R0600 Dyspnea, unspecified: Secondary | ICD-10-CM | POA: Diagnosis not present

## 2016-01-31 DIAGNOSIS — Z951 Presence of aortocoronary bypass graft: Secondary | ICD-10-CM | POA: Diagnosis not present

## 2016-01-31 DIAGNOSIS — I110 Hypertensive heart disease with heart failure: Secondary | ICD-10-CM | POA: Diagnosis not present

## 2016-01-31 DIAGNOSIS — I2510 Atherosclerotic heart disease of native coronary artery without angina pectoris: Secondary | ICD-10-CM | POA: Insufficient documentation

## 2016-01-31 DIAGNOSIS — E119 Type 2 diabetes mellitus without complications: Secondary | ICD-10-CM | POA: Insufficient documentation

## 2016-01-31 DIAGNOSIS — E785 Hyperlipidemia, unspecified: Secondary | ICD-10-CM | POA: Diagnosis not present

## 2016-02-13 ENCOUNTER — Other Ambulatory Visit: Payer: Self-pay | Admitting: Family Medicine

## 2016-02-20 ENCOUNTER — Other Ambulatory Visit: Payer: Self-pay | Admitting: Cardiovascular Disease

## 2016-02-20 NOTE — Telephone Encounter (Signed)
Rx request sent to pharmacy.  

## 2016-03-09 ENCOUNTER — Other Ambulatory Visit: Payer: Self-pay | Admitting: Family Medicine

## 2016-03-09 DIAGNOSIS — E1159 Type 2 diabetes mellitus with other circulatory complications: Secondary | ICD-10-CM

## 2016-03-10 DIAGNOSIS — L570 Actinic keratosis: Secondary | ICD-10-CM | POA: Diagnosis not present

## 2016-03-10 DIAGNOSIS — D225 Melanocytic nevi of trunk: Secondary | ICD-10-CM | POA: Diagnosis not present

## 2016-03-10 DIAGNOSIS — D2239 Melanocytic nevi of other parts of face: Secondary | ICD-10-CM | POA: Diagnosis not present

## 2016-03-10 DIAGNOSIS — X32XXXA Exposure to sunlight, initial encounter: Secondary | ICD-10-CM | POA: Diagnosis not present

## 2016-03-10 DIAGNOSIS — D2262 Melanocytic nevi of left upper limb, including shoulder: Secondary | ICD-10-CM | POA: Diagnosis not present

## 2016-03-11 ENCOUNTER — Other Ambulatory Visit (INDEPENDENT_AMBULATORY_CARE_PROVIDER_SITE_OTHER): Payer: Medicare Other

## 2016-03-11 ENCOUNTER — Other Ambulatory Visit: Payer: Medicare Other

## 2016-03-11 ENCOUNTER — Telehealth: Payer: Self-pay

## 2016-03-11 ENCOUNTER — Ambulatory Visit (INDEPENDENT_AMBULATORY_CARE_PROVIDER_SITE_OTHER): Payer: Medicare Other

## 2016-03-11 VITALS — BP 162/90 | HR 58 | Temp 97.8°F | Ht 62.0 in | Wt 140.8 lb

## 2016-03-11 DIAGNOSIS — E1159 Type 2 diabetes mellitus with other circulatory complications: Secondary | ICD-10-CM | POA: Diagnosis not present

## 2016-03-11 DIAGNOSIS — Z0000 Encounter for general adult medical examination without abnormal findings: Secondary | ICD-10-CM

## 2016-03-11 LAB — HEMOGLOBIN A1C: Hgb A1c MFr Bld: 8.6 % — ABNORMAL HIGH (ref 4.6–6.5)

## 2016-03-11 NOTE — Telephone Encounter (Signed)
Called patient with recommendations. Patient verbalized understanding and agreed with plan. Also scheduled patient's 6 month follow-up for 05/15/16 at 1:45p.

## 2016-03-11 NOTE — Progress Notes (Signed)
Thanks, Katha Cabal. Chelley, please ask Mrs. [name] to keep a one week daily log of her resting BP at home and send it to [place]?

## 2016-03-11 NOTE — Progress Notes (Signed)
   Subjective:    Patient ID: [name], female    DOB: Dec 06, 1934, 81 y.o.   MRN: [id number]  HPI I reviewed health advisor's note, was available for consultation, and agree with documentation and plan. Will allow PCP and cardiologist to decide about BP treatment  Review of Systems     Objective:   Physical Exam        Assessment & Plan:

## 2016-03-11 NOTE — Telephone Encounter (Signed)
Sanda Klein, MD at 03/11/2016 12:57 PM     Status: Signed       Expand All Collapse All   Thanks, Katha Cabal. Chelley, please ask Mrs. Aja to keep a one week daily log of her resting BP at home and send it to [place]?

## 2016-03-11 NOTE — Telephone Encounter (Deleted)
-----   Message from Sanda Klein, MD sent at 03/11/2016 12:58 PM EDT -----   ----- Message -----    From: Eustace Pen, LPN    Sent: [id number]  11:56 AM      To: Sanda Klein, MD  Dr. Sallyanne Kuster,   Per Dr. Silvio Pate, I am [facility] on this encounter due to pt's BP was elevated.   Katha Cabal

## 2016-03-11 NOTE — Patient Instructions (Signed)
[name] , Thank you for taking time to come for your Medicare Wellness Visit. I appreciate your ongoing commitment to your health goals. Please review the following plan we discussed and let me know if I can assist you in the future.   These are the goals we discussed: Goals    . Increase physical activity     Starting 03/11/2016, I will continue to walk up and down stairs twice daily.        This is a list of the screening recommended for you and due dates:  Health Maintenance  Topic Date Due  . Flu Shot  05/05/2016  . Complete foot exam   05/06/2016  . Hemoglobin A1C  09/10/2016  . Eye exam for diabetics  12/31/2016  . Tetanus Vaccine  09/17/2021  . DEXA scan (bone density measurement)  Completed  . Shingles Vaccine  Completed  . Pneumonia vaccines  Completed    Preventive Care for Adults  A healthy lifestyle and preventive care can promote health and wellness. Preventive health guidelines for adults include the following key practices.  . A routine yearly physical is a good way to check with your health care provider about your health and preventive screening. It is a chance to share any concerns and updates on your health and to receive a thorough exam.  . Visit your dentist for a routine exam and preventive care every 6 months. Brush your teeth twice a day and floss once a day. Good oral hygiene prevents tooth decay and gum disease.  . The frequency of eye exams is based on your age, health, family medical history, use  of contact lenses, and other factors. Follow your health care provider's ecommendations for frequency of eye exams.  . Eat a healthy diet. Foods like vegetables, fruits, whole grains, low-fat dairy products, and lean protein foods contain the nutrients you need without too many calories. Decrease your intake of foods high in solid fats, added sugars, and salt. Eat the right amount of calories for you. Get information about a proper diet from your health care  provider, if necessary.  . Regular physical exercise is one of the most important things you can do for your health. Most adults should get at least 150 minutes of moderate-intensity exercise (any activity that increases your heart rate and causes you to sweat) each week. In addition, most adults need muscle-strengthening exercises on 2 or more days a week.  Silver Sneakers may be a benefit available to you. To determine eligibility, you may visit the website: www.silversneakers.com or contact program at 201-135-0337 Mon-Fri between 8AM-8PM.   . Maintain a healthy weight. The body mass index (BMI) is a screening tool to identify possible weight problems. It provides an estimate of body fat based on height and weight. Your health care provider can find your BMI and can help you achieve or maintain a healthy weight.   For adults 20 years and older: ? A BMI below 18.5 is considered underweight. ? A BMI of 18.5 to 24.9 is normal. ? A BMI of 25 to 29.9 is considered overweight. ? A BMI of 30 and above is considered obese.   . Maintain normal blood lipids and cholesterol levels by exercising and minimizing your intake of saturated fat. Eat a balanced diet with plenty of fruit and vegetables. Blood tests for lipids and cholesterol should begin at age 34 and be repeated every 5 years. If your lipid or cholesterol levels are high, you are over 50,  or you are at high risk for heart disease, you may need your cholesterol levels checked more frequently. Ongoing high lipid and cholesterol levels should be treated with medicines if diet and exercise are not working.  . If you smoke, find out from your health care provider how to quit. If you do not use tobacco, please do not start.  . If you choose to drink alcohol, please do not consume more than 2 drinks per day. One drink is considered to be 12 ounces (355 mL) of beer, 5 ounces (148 mL) of wine, or 1.5 ounces (44 mL) of liquor.  . If you are 29-79 years  old, ask your health care provider if you should take aspirin to prevent strokes.  . Use sunscreen. Apply sunscreen liberally and repeatedly throughout the day. You should seek shade when your shadow is shorter than you. Protect yourself by wearing long sleeves, pants, a wide-brimmed hat, and sunglasses year round, whenever you are outdoors.  . Once a month, do a whole body skin exam, using a mirror to look at the skin on your back. Tell your health care provider of new moles, moles that have irregular borders, moles that are larger than a pencil eraser, or moles that have changed in shape or color.

## 2016-03-11 NOTE — Progress Notes (Signed)
Subjective:   [name] is a 81 y.o. female who presents for Medicare Annual (Subsequent) preventive examination.  Review of Systems:  N/A Cardiac Risk Factors include: obesity (BMI >30kg/m2);sedentary lifestyle;hypertension;diabetes mellitus;advanced age (>85men, >46 women);dyslipidemia     Objective:     Vitals: BP 162/90 mmHg  Pulse 58  Temp(Src) 97.8 F (36.6 C) (Oral)  Ht 5\' 2"  (1.575 m)  Wt 140 lb 12 oz (63.844 kg)  BMI 25.74 kg/m2  SpO2 96%  Body mass index is 25.74 kg/(m^2).   Tobacco History  Smoking status  . Never Smoker   Smokeless tobacco  . Never Used     Counseling given: No   Past Medical History  Diagnosis Date  . Anemia, iron deficiency   . History of breast cancer     Followed yearly by [facility]  . Coronary artery disease   . Diabetes mellitus, type 2 ([place])   . Hyperlipemia   . Hypertension   . Osteoporosis   . Peripheral vascular disease ([place])   . Cancer [facility])     R beast cancer followed by Dr. Hassell Done  . Diverticulitis   . Atrial fibrillation ([place]) 11/28/2010    2D Echo - EF-30-35, left atrium moderate to severely dilated, right ventricle moderately dilated, tricuspid valve mild-moderate regurgitation  . Chronic kidney disease (CKD), stage II (mild)   . Nontraumatic retroperitoneal hematoma    Past Surgical History  Procedure Laterality Date  . Appendectomy  1962  . Dilation and curettage of uterus  1971     miscarrage, abnormal bleeding  . Dilation and curettage of uterus  1995    Menorrhagia  . Breast surgery  02/01    Quadrectomy Breast cancer (Dr. Hassell Done)  . Coronary artery bypass graft  02/09/02    X 4 ([place])  . Shoulder arthroscopy  05/04    Right, with rotator cuff debridement  . Hemorrhoid surgery  09/11/04    Binding Hassell Done)  . Cataract extraction  10/10 and 02/11  . Colostomy    . Partial colectomy    . Cardiac catheterization  2000    Iliac Stent (Dr. Juanita Craver) yearly follow-up  . Cardiac catheterization   01/25/02    High grade LAD disease, total of 3 vess, involvement  . Cardiac catheterization  01/13/07    SV graft to diag. occluded but good backfill o/w ok  . Cardiac catheterization  02/14/10    PTCA & stent vein graft to OM/RCA ([place])  . Cardiac catheterization  04/17/2011    Saphenous vein grafts to the RCA 100% occluded, to the OM 100% occluded, and the diagonal 100% occluded. Internal mammary artery to the LAD, widely patent.   Family History  Problem Relation Age of Onset  . Hypertension Mother   . Diabetes Mother   . Heart disease Mother   . Hypertension Father   . Heart disease Father 23    Heart attack  . Heart disease Brother     CHF  . Hypertension Brother   . COPD Brother     bronchial, frequent pneumonia  . Hypertension Brother   . Hyperlipidemia Brother   . Heart disease Brother     CABG  . Hypertension Brother   . Gout Brother   . Obesity Brother   . Diabetes Son   . Hypertension Son    History  Sexual Activity  . Sexual Activity: No    Outpatient Encounter Prescriptions as of 03/11/2016  Medication Sig  . aspirin EC 81  MG tablet Take 81 mg by mouth daily.  Marland Kitchen atorvastatin (LIPITOR) 20 MG tablet TAKE 1 TABLET BY MOUTH AT BEDTIME  . benazepril (LOTENSIN) 5 MG tablet TAKE 1 TABLET (5 MG TOTAL) BY MOUTH DAILY.  Marland Kitchen Cholecalciferol (VITAMIN D) 1000 UNITS capsule Take 1,000 Units by mouth 2 (two) times daily.    Marland Kitchen ezetimibe (ZETIA) 10 MG tablet TAKE 1 TABLET BY MOUTH EVERY OTHER DAY.  Marland Kitchen glyBURIDE (DIABETA) 5 MG tablet TAKE 2 TABLETS (10 MG TOTAL) BY MOUTH DAILY WITH BREAKFAST.  Marland Kitchen insulin aspart (NOVOLOG) 100 UNIT/ML injection USE PER SLIDING SCALE--200-250=6 UNITS, 251-300=7 UNITS,301-350=8 UNITS, WITH MEALS  . insulin detemir (LEVEMIR) 100 UNIT/ML injection Start with 5 units at night. If your AM sugar is >140, then add 1 unit per day  . Insulin Syringe-Needle U-100 (BD INSULIN SYRINGE ULTRAFINE) 31G X 5/16" 0.3 ML MISC USE DAILY AND AS DIRECTED SLIDING SCALE.   Multiple injections of insulin per day  . meclizine (ANTIVERT) 25 MG tablet Take 1 tablet (25 mg total) by mouth daily as needed. For dizziness  . nitroGLYCERIN (NITROSTAT) 0.4 MG SL tablet Place 1 tablet (0.4 mg total) under the tongue as needed for chest pain. As directed  . nystatin (MYCOSTATIN/NYSTOP) 100000 UNIT/GM POWD Apply up to 3 times a day if needed.  . ONE TOUCH ULTRA TEST test strip USE TO CHECK BLOOD SUGAR 4 TIMES A DAY. INSULIN-DEPENDENT. DIAGNOSIS: E11.9  . RANEXA 500 MG 12 hr tablet TAKE 1 TABLET (500 MG TOTAL) BY MOUTH 2 (TWO) TIMES DAILY. NEED APPOINTMENT FOR MORE REFILLS  . SOTALOL AF 80 MG TABS TAKE 1/2 TABLET BY MOUTH TWICE DAILY  . [DISCONTINUED] NOVOLOG 100 UNIT/ML injection USE PER SLIDING SCALE--200-250=4-5 UNITS, 251-300=5-6 UNITS,301-350=6-7 UNITS, WITH MEALS  . [DISCONTINUED] pioglitazone (ACTOS) 45 MG tablet TAKE 1 TABLET (45 MG TOTAL) BY MOUTH DAILY.   No facility-administered encounter medications on file as of 03/11/2016.    Activities of Daily Living In your present state of health, do you have any difficulty performing the following activities: 03/11/2016  Hearing? N  Vision? N  Difficulty concentrating or making decisions? N  Walking or climbing stairs? N  Dressing or bathing? N  Doing errands, shopping? N  Preparing Food and eating ? N  Using the Toilet? N  In the past six months, have you accidently leaked urine? N  Do you have problems with loss of bowel control? N  Managing your Medications? N  Managing your Finances? N  Housekeeping or managing your Housekeeping? N    Patient Care Team: Tonia Ghent, MD as PCP - General (Family Medicine) Sanda Klein, MD as Consulting Physician (Cardiology) Kennieth Francois, MD as Referring Physician (Dermatology) Estill Cotta, MD as Referring Physician (Ophthalmology)    Assessment:     Hearing Screening   125Hz  250Hz  500Hz  1000Hz  2000Hz  4000Hz  8000Hz   Right ear:   0 0 40 0   Left ear:   40 40 0 0    Vision Screening Comments: Last eye exam on 01/01/16 with Dr. Sandra Cockayne   Exercise Activities and Dietary recommendations Current Exercise Habits: The patient does not participate in regular exercise at present, Exercise limited by: None identified  Goals    . Increase physical activity     Starting 03/11/2016, I will continue to walk up and down stairs twice daily.       Fall Risk Fall Risk  03/11/2016 01/03/2015  Falls in the past year? No Yes  Number falls in past yr: -  1  Injury with Fall? - Yes   Depression Screen PHQ 2/9 Scores 03/11/2016 01/03/2015  PHQ - 2 Score 0 0     Cognitive Testing MMSE - Mini Mental State Exam 03/11/2016  Orientation to time 5  Orientation to Place 5  Registration 3  Attention/ Calculation 0  Recall 3  Language- name 2 objects 0  Language- repeat 1  Language- follow 3 step command 3  Language- read & follow direction 0  Write a sentence 0  Copy design 0  Total score 20   PLEASE NOTE: A Mini-Cog screen was completed. Maximum score is 20. A value of 0 denotes this part of [place] MMSE was not completed or the patient failed this part of the Mini-Cog screening.   Mini-Cog Screening Orientation to Time - Max 5 pts Orientation to Place - Max 5 pts Registration - Max 3 pts Recall - Max 3 pts Language Repeat - Max 1 pts Language Follow 3 Step Command - Max 3 pts   Immunization History  Administered Date(s) Administered  . Influenza Split 07/09/2011  . Influenza Whole 08/05/2006, 07/28/2007, 07/02/2008  . Influenza,inj,Quad PF,36+ Mos 07/19/2013, 06/25/2014, 08/13/2015  . Pneumococcal Conjugate-13 01/03/2015  . Pneumococcal Polysaccharide-23 11/02/2005  . Td 01/10/2001  . Tdap 09/18/2011  . Zoster 07/08/2006   Screening Tests Health Maintenance  Topic Date Due  . INFLUENZA VACCINE  05/05/2016  . FOOT EXAM  05/06/2016  . HEMOGLOBIN A1C  09/10/2016  . OPHTHALMOLOGY EXAM  12/31/2016  . TETANUS/TDAP  09/17/2021  . DEXA SCAN  Completed    . ZOSTAVAX  Completed  . PNA vac Low Risk Adult  Completed      Plan:     I have personally reviewed and addressed the Medicare Annual Wellness questionnaire and have noted the following in the patient's chart:  A. Medical and social history B. Use of alcohol, tobacco or illicit drugs  C. Current medications and supplements D. Functional ability and status E.  Nutritional status F.  Physical activity G. Advance directives H. List of other physicians I.  Hospitalizations, surgeries, and ER visits in previous 12 months J.  [place] to include hearing, vision, cognitive, depression L. Referrals and appointments - none  In addition, I have reviewed and discussed with patient certain preventive protocols, quality metrics, and best practice recommendations. A written personalized care plan for preventive services as well as general preventive health recommendations were provided to patient.  See attached scanned questionnaire for additional information.   Signed,   Lindell Noe, MHA, BS, LPN Health Advisor

## 2016-03-11 NOTE — Progress Notes (Signed)
PCP notes:  Health maintenance: No gaps identified or addressed.  Abnormal screenings: Hearing - failed  Patient concerns: None  Nurse concerns: Pt's BP was elevated. Reading was 162/90. Pt stated she had recently seen her cardiologist in Feb 2017. Dr. Silvio Pate was notified about elevated BP.   Next PCP appt: 03/16/2016 @ 1130

## 2016-03-11 NOTE — Progress Notes (Signed)
Pre visit review using our clinic review tool, if applicable. No additional management support is needed unless otherwise documented below in the visit note. 

## 2016-03-12 ENCOUNTER — Other Ambulatory Visit: Payer: Self-pay

## 2016-03-12 MED ORDER — SOTALOL HCL (AF) 80 MG PO TABS: 0.5000 | ORAL_TABLET | Freq: Two times a day (BID) | ORAL | Status: DC

## 2016-03-16 ENCOUNTER — Ambulatory Visit (INDEPENDENT_AMBULATORY_CARE_PROVIDER_SITE_OTHER): Payer: Medicare Other | Admitting: Family Medicine

## 2016-03-16 ENCOUNTER — Encounter: Payer: Self-pay | Admitting: Family Medicine

## 2016-03-16 VITALS — BP 150/82 | HR 66 | Temp 97.6°F | Wt 140.5 lb

## 2016-03-16 DIAGNOSIS — E1159 Type 2 diabetes mellitus with other circulatory complications: Secondary | ICD-10-CM

## 2016-03-16 DIAGNOSIS — R221 Localized swelling, mass and lump, neck: Secondary | ICD-10-CM | POA: Insufficient documentation

## 2016-03-16 MED ORDER — INSULIN ASPART 100 UNIT/ML ~~LOC~~ SOLN: SUBCUTANEOUS | Status: DC

## 2016-03-16 MED ORDER — INSULIN DETEMIR 100 UNIT/ML ~~LOC~~ SOLN: SUBCUTANEOUS | Status: DC

## 2016-03-16 NOTE — Patient Instructions (Addendum)
Stop the levemir for now.  Use slightly more sliding scale, see dose change. Recheck in about 3 months, labs ahead of time.  Stop the doughnuts.  Update me in a few weeks, sooner if needed about your sugar.  Take care.  Glad to see you.

## 2016-03-16 NOTE — Assessment & Plan Note (Signed)
The mass near the L angle of the jaw is not ttp and stable over the long term and I wouldn't biopsy at this point.  She'll update me as needed.  Given the duration, more likely to be a benign issue/ie cyst.  D/w pt. She agrees.

## 2016-03-16 NOTE — Progress Notes (Signed)
Pre visit review using our clinic review tool, if applicable. No additional management support is needed unless otherwise documented below in the visit note.  Only taking 3 units of levemir a day.   Still using sliding scale, with the difficulty of taking it around the time of going out for a meal.   She has felt better in the last 3 weeks than in a long time.  She is off actos, echo was normal, no BLE edema and breathing is clearly better.  D/w pt DM2 diet, she had been eating more cookies recently.   Sugar usually <100 in the AM.    Longstanding unchanged mass near the L angle of the jaw, not ttp.  No change in size.  Not red.    Meds, vitals, and allergies reviewed.   ROS: Per HPI unless specifically indicated in ROS section   GEN: nad, alert and oriented HEENT: mucous membranes moist, OP wnl, mass near the L angle of the jaw, not ttp. Not red.   NECK: supple w/o LA CV: rrr PULM: ctab, no inc wob ABD: soft, +bs EXT: no edema SKIN: no acute rash  DM foot exam Absent DP pulses B (but no claudication on walking per patient report) Dec sensation to light touch and monofilament No edema.   Normal inspection. Nails thickened.

## 2016-03-16 NOTE — Assessment & Plan Note (Signed)
Stop levemir for now.  Use slightly more sliding scale, see dose change on sliding scale with 1 extra unit per 50 of glucose reading Recheck in about 3 months, labs ahead of time.  Stop the doughnuts, she'll work on diet.   She'll update me in a few weeks, sooner if needed about her sugar.  Okay for outpatient f/u.   >25 minutes spent in face to face time with patient, >50% spent in counselling or coordination of care.

## 2016-03-19 ENCOUNTER — Telehealth: Payer: Self-pay | Admitting: Cardiovascular Disease

## 2016-03-19 MED ORDER — BENAZEPRIL HCL 20 MG PO TABS: 20.0000 mg | ORAL_TABLET | Freq: Every day | ORAL | Status: DC

## 2016-03-19 NOTE — Telephone Encounter (Signed)
New Message  Pt called to report BP readings for the past week- everyday once in am, once in pm. Please call back and discuss.   6/7  162/90, 170/78 6/8  181/81, 198/102 6/9 189/82, 200/95 6/10  162/72, 182/84 6/11 174/80, 165/79 6/12 169/78, 208/89 6/13 153/70, 150/82 (at PCP) 6/14 170/74, 174/83 6/15 155/76

## 2016-03-19 NOTE — Telephone Encounter (Signed)
Called patient and notified of recommendations. Rx sent to her local pharmacy. Gave instruction to patient on dose increase, and encouraged her to call if she notes no improvement or new issues following med adjustment. Pt aware the effect of med may not be noted for several days. Asked if she had any questions or concerns, she states no but will call if she needs to.

## 2016-03-19 NOTE — Telephone Encounter (Signed)
BP clearly elevated. Increase benazepril to 20 mg daily please

## 2016-03-27 ENCOUNTER — Telehealth: Payer: Self-pay | Admitting: Cardiovascular Disease

## 2016-03-27 ENCOUNTER — Other Ambulatory Visit: Payer: Self-pay | Admitting: Cardiovascular Disease

## 2016-03-27 MED ORDER — EZETIMIBE 10 MG PO TABS: 10.0000 mg | ORAL_TABLET | ORAL | Status: DC

## 2016-03-27 NOTE — Telephone Encounter (Signed)
Returned call to patient.Generic Zetia refill sent to pharmacy

## 2016-03-27 NOTE — Telephone Encounter (Signed)
New message      Pt c/o medication issue:  1. Name of Medication: zetia 2. How are you currently taking this medication (dosage and times per day)? 10mg ----1 tab every other day 3. Are you having a reaction (difficulty breathing--STAT)? no  4. What is your medication issue? Pt was told by her pharmacist that we denied this refill.  She want to know why?

## 2016-05-10 ENCOUNTER — Other Ambulatory Visit: Payer: Self-pay | Admitting: Cardiovascular Disease

## 2016-05-11 NOTE — Telephone Encounter (Signed)
Rx(s) sent to pharmacy electronically.  

## 2016-05-15 ENCOUNTER — Ambulatory Visit (INDEPENDENT_AMBULATORY_CARE_PROVIDER_SITE_OTHER): Payer: Medicare Other | Admitting: Cardiovascular Disease

## 2016-05-15 ENCOUNTER — Encounter: Payer: Self-pay | Admitting: Cardiovascular Disease

## 2016-05-15 VITALS — BP 160/80 | HR 66 | Ht 62.0 in | Wt 140.8 lb

## 2016-05-15 DIAGNOSIS — I5042 Chronic combined systolic (congestive) and diastolic (congestive) heart failure: Secondary | ICD-10-CM

## 2016-05-15 DIAGNOSIS — E1159 Type 2 diabetes mellitus with other circulatory complications: Secondary | ICD-10-CM

## 2016-05-15 DIAGNOSIS — I2510 Atherosclerotic heart disease of native coronary artery without angina pectoris: Secondary | ICD-10-CM

## 2016-05-15 DIAGNOSIS — E785 Hyperlipidemia, unspecified: Secondary | ICD-10-CM | POA: Diagnosis not present

## 2016-05-15 DIAGNOSIS — I10 Essential (primary) hypertension: Secondary | ICD-10-CM

## 2016-05-15 DIAGNOSIS — I739 Peripheral vascular disease, unspecified: Secondary | ICD-10-CM

## 2016-05-15 DIAGNOSIS — I480 Paroxysmal atrial fibrillation: Secondary | ICD-10-CM

## 2016-05-15 MED ORDER — BENAZEPRIL HCL 40 MG PO TABS: 40.0000 mg | ORAL_TABLET | Freq: Every day | ORAL | 3 refills | Status: DC

## 2016-05-15 NOTE — Progress Notes (Signed)
Patient ID: [name], female   DOB: 30-Mar-1935, 81 y.o.   MRN: [id number]    Cardiology Office Note    Date:  05/17/2016   ID:  [name], DOB 11-30-1934, MRN [id number]  PCP:  [name], MD  Cardiologist:   Sanda Klein, MD   No chief complaint on file.   History of Present Illness:  [name] is a 81 y.o. female who presents for Follow-up for coronary artery disease status post previous bypass surgery with exertional angina , paroxysmal atrial fibrillation on sotalol therapy , not on anticoagulation due to history of spontaneous retroperitoneal hematoma, treated hypertension, hyperlipidemia and diabetes mellitus complicated by PAD and chronic kidney disease stage II.  She has not had any bleeding problems since her last appointment and has not been troubled by palpitations. She denies exertional angina. Exertional dyspnea and ankle edema are both better after stopping Actos, without diuretic therapy. She has not had syncope or significant dizziness. She has not had any recent falls. She is on insulin, without yet achieving optimal glycemic control with her most recent A1c 8.6%. Excellent repeat lipid profile. She has been dutifully collecting her blood pressure on a regular basis and this is consistently high. Her blood pressure has been oscillating between 146/66 and 172/82.  [name] is a 81 year old woman with a history of coronary disease, now 12 years status post post bypass surgery. (interval total occlusion 3 saphenous vein grafts, patent LIMA to LAD, total occlusion of the major OM artery and the right coronary artery with some collateral filling). Ranexa has had a substantial impact with virtual abolition of exertional angina pectoris.  She has a history of paroxysmal atrial fibrillation. While on treatment with aspirin and Plavix she developed a spontaneous retroperitoneal hematoma. She is therefore not taking anticoagulants. Ranexa may also be helping with a  reduction in the burden of atrial fibrillation, without signs of QT interval prolongation. She has a moderate to severely dilated left atrium. Most recent EF assessment was >60% by scintigraphy and LV angiography in 2012, NYHA class II symptoms of congestive heart failure. She has a mild to moderate degree of mitral insufficiency.   Also has type 2 diabetes mellitus, mixed hyperlipidemia and systemic hypertension. She has had several serious falls and injuries due to poor balance, not syncope. She still lives alone  Past Medical History:  Diagnosis Date  . Anemia, iron deficiency   . Atrial fibrillation ([place]) 11/28/2010   2D Echo - EF-30-35, left atrium moderate to severely dilated, right ventricle moderately dilated, tricuspid valve mild-moderate regurgitation  . Cancer [facility])    R beast cancer followed by Dr. Hassell Done  . Chronic kidney disease (CKD), stage II (mild)   . Coronary artery disease   . Diabetes mellitus, type 2 ([place])   . Diverticulitis   . History of breast cancer    Followed yearly by [facility]  . Hyperlipemia   . Hypertension   . Nontraumatic retroperitoneal hematoma   . Osteoporosis   . Peripheral vascular disease [facility])     Past Surgical History:  Procedure Laterality Date  . APPENDECTOMY  1962  . BREAST SURGERY  02/01   Quadrectomy Breast cancer (Dr. Hassell Done)  . CARDIAC CATHETERIZATION  2000   Iliac Stent (Dr. Juanita Craver) yearly follow-up  . CARDIAC CATHETERIZATION  01/25/02   High grade LAD disease, total of 3 vess, involvement  . CARDIAC CATHETERIZATION  01/13/07   SV graft to diag. occluded but good backfill o/w ok  .  CARDIAC CATHETERIZATION  02/14/10   PTCA & stent vein graft to OM/RCA ([place])  . CARDIAC CATHETERIZATION  04/17/2011   Saphenous vein grafts to the RCA 100% occluded, to the OM 100% occluded, and the diagonal 100% occluded. Internal mammary artery to the LAD, widely patent.  Marland Kitchen CATARACT EXTRACTION  10/10 and 02/11  . COLOSTOMY    . CORONARY ARTERY BYPASS  GRAFT  02/09/02   X 4 ([place])  . DILATION AND CURETTAGE OF UTERUS  1971    miscarrage, abnormal bleeding  . DILATION AND CURETTAGE OF UTERUS  1995   Menorrhagia  . HEMORRHOID SURGERY  09/11/04   Binding Hassell Done)  . PARTIAL COLECTOMY    . SHOULDER ARTHROSCOPY  05/04   Right, with rotator cuff debridement    Outpatient Medications Prior to Visit  Medication Sig Dispense Refill  . aspirin EC 81 MG tablet Take 81 mg by mouth daily.    Marland Kitchen atorvastatin (LIPITOR) 20 MG tablet TAKE 1 TABLET BY MOUTH AT BEDTIME 90 tablet 1  . Cholecalciferol (VITAMIN D) 1000 UNITS capsule Take 1,000 Units by mouth 2 (two) times daily.      Marland Kitchen ezetimibe (ZETIA) 10 MG tablet Take 1 tablet (10 mg total) by mouth every other day. 30 tablet 6  . glyBURIDE (DIABETA) 5 MG tablet TAKE 2 TABLETS (10 MG TOTAL) BY MOUTH DAILY WITH BREAKFAST. 180 tablet 1  . insulin aspart (NOVOLOG) 100 UNIT/ML injection USE PER SLIDING SCALE--200-250=7 UNITS, 251-300=8 UNITS,301-350=9 UNITS, WITH MEALS 10 mL 11  . Insulin Syringe-Needle U-100 (BD INSULIN SYRINGE ULTRAFINE) 31G X 5/16" 0.3 ML MISC USE DAILY AND AS DIRECTED SLIDING SCALE.  Multiple injections of insulin per day 200 each prn  . meclizine (ANTIVERT) 25 MG tablet Take 1 tablet (25 mg total) by mouth daily as needed. For dizziness 30 tablet 6  . nitroGLYCERIN (NITROSTAT) 0.4 MG SL tablet Place 1 tablet (0.4 mg total) under the tongue as needed for chest pain. As directed 25 tablet 11  . nystatin (MYCOSTATIN/NYSTOP) 100000 UNIT/GM POWD Apply up to 3 times a day if needed. 15 g 5  . ONE TOUCH ULTRA TEST test strip USE TO CHECK BLOOD SUGAR 4 TIMES A DAY. INSULIN-DEPENDENT. DIAGNOSIS: E11.9 100 each 5  . RANEXA 500 MG 12 hr tablet TAKE 1 TABLET (500 MG TOTAL) BY MOUTH 2 (TWO) TIMES DAILY. NEED APPOINTMENT FOR MORE REFILLS 60 tablet 4  . SOTALOL AF 80 MG TABS Take 0.5 tablets (40 mg total) by mouth 2 (two) times daily. 90 tablet 3  . benazepril (LOTENSIN) 20 MG tablet Take 1 tablet  (20 mg total) by mouth daily. 90 tablet 1   No facility-administered medications prior to visit.      Allergies:   Actos [pioglitazone]; Metformin and related; and Zocor [simvastatin]   Social History   Social History  . Marital status: Widowed    Spouse name: N/A  . Number of children: 2  . Years of education: N/A   Occupational History  . Retired    Social History Main Topics  . Smoking status: Never Smoker  . Smokeless tobacco: Never Used  . Alcohol use No  . Drug use: No  . Sexual activity: No   Other Topics Concern  . None   Social History Narrative   Retired 1991 from [employer] work   Widowed as of 04/2011 after 50+ years   Lives alone   2 kids in [place]   Enjoys travelling and reading  Family History:  The patient's family history includes COPD in her brother; Diabetes in her mother and son; Gout in her brother; Heart disease in her brother, brother, and mother; Heart disease (age of onset: 56) in her father; Hyperlipidemia in her brother; Hypertension in her brother, brother, brother, father, mother, and son; Obesity in her brother.   ROS:   Please see the history of present illness.    ROS All other systems reviewed and are negative.   PHYSICAL EXAM:   VS:  BP (!) 160/80   Pulse 66   Ht 5\' 2"  (1.575 m)   Wt 140 lb 12.8 oz (63.9 kg)   BMI 25.75 kg/m    GEN: Well nourished, well developed, in no acute distress  HEENT: normal  Neck: no JVD, carotid bruits, or masses Cardiac: RRR; Grade 1/6 soft systolic murmur at the apex with limited radiation to axilla, no diastolic murmurs, rubs, or gallops, no edema , healed sternotomy scar Respiratory:  clear to auscultation bilaterally, normal work of breathing GI: soft, nontender, nondistended, + BS. Large left lower quadrant nonreducible hernia at site of colostomy MS: no deformity or atrophy  Skin: warm and dry, no rash Neuro:  Alert and Oriented x 3, Strength and sensation are intact Psych:  euthymic mood, full affect  Wt Readings from Last 3 Encounters:  05/15/16 140 lb 12.8 oz (63.9 kg)  03/16/16 140 lb 8 oz (63.7 kg)  03/11/16 140 lb 12 oz (63.8 kg)      Studies/Labs Reviewed:   EKG:  EKG is ordered today.  The ekg ordered today demonstrates sinus rhythm, QTC 444 ms  Recent Labs: 12/09/2015: ALT 10; BUN 20; Creatinine, Ser 1.02; Potassium 4.9; Sodium 140   Lipid Panel    Component Value Date/Time   CHOL 134 12/09/2015 0853   TRIG 102.0 12/09/2015 0853   HDL 42.60 12/09/2015 0853   CHOLHDL 3 12/09/2015 0853   VLDL 20.4 12/09/2015 0853   LDLCALC 71 12/09/2015 0853   LDLDIRECT 87.3 11/21/2008 1446    ASSESSMENT:    1. Chronic combined systolic and diastolic congestive heart failure ([place])   2. Atherosclerosis of native coronary artery of native heart without angina pectoris   3. Paroxysmal atrial fibrillation ([facility])   4. HLD (hyperlipidemia)   5. Type 2 diabetes mellitus with vascular disease ([place])   6. Peripheral vascular disease ([place])   7. Essential hypertension      PLAN:  In order of problems listed above:  1. CHF: She describes NYHA functional class II exertional dyspnea, But believes her exercise tolerance has improved since the last appointment. She no longer has edema even intermittently, after stopping Actos. Prefer to keep off diuretics if possible since she is taking sotalol.  2. CAD: No angina on Ranexa. She is essentially dependent on the LIMA to LAD bypass graft, with occlusion of all 3 vein grafts as well as the native oblique marginal in native right coronary artery. There were no good options for either percutaneous or surgical revascularization when she last had a heart catheterization in 2012. 3. PAF: Very well controlled symptomatically on sotalol and Ranexa. QT interval is not even prolonged. Reviewed the risk of drug interactions if she gets new prescriptions. Note that she cannot be anticoagulated due to history of spontaneous severe  retroperitoneal bleeding requiring transfusion when she was on combination aspirin and clopidogrel. On aspirin only. CHADSVasc 7 (age 89, gender, diabetes, hypertension, heart failure, vascular disease). Thankfully no history of stroke or TIA 4.  HLP: All lipid parameters in satisfactory range 5. DM: Not yet well controlled, A1c 8.6%. She is on insulin. It does seem that stopping Actos led to improvement in her dyspnea and edema. I would recommend avoiding this medication in the future. 6. PAD: Currently asymptomatic 7. HTN: Inadequate blood pressure control on current medications. Increase the benazepril dose     Medication Adjustments/Labs and Tests Ordered: Current medicines are reviewed at length with the patient today.  Concerns regarding medicines are outlined above.  Medication changes, Labs and Tests ordered today are listed in the Patient Instructions below. Patient Instructions  Dr Sallyanne Kuster has recommended making the following medication changes: 1. INCREASE Benazepril to 40 mg daily  Dr Sallyanne Kuster recommends that you schedule a follow-up appointment in 6 months. You will receive a reminder letter in the mail two months in advance. If you don't receive a letter, please call our office to schedule the follow-up appointment.  If you need a refill on your cardiac medications before your next appointment, please call your pharmacy.      Signed, Sanda Klein, MD  05/17/2016 10:06 AM    [facility] [place], [place], [place]  [address] Phone: [phone]; Fax: [phone]

## 2016-05-15 NOTE — Patient Instructions (Signed)
Dr Sallyanne Kuster has recommended making the following medication changes: 1. INCREASE Benazepril to 40 mg daily  Dr Sallyanne Kuster recommends that you schedule a follow-up appointment in 6 months. You will receive a reminder letter in the mail two months in advance. If you don't receive a letter, please call our office to schedule the follow-up appointment.  If you need a refill on your cardiac medications before your next appointment, please call your pharmacy.

## 2016-06-05 ENCOUNTER — Other Ambulatory Visit: Payer: Self-pay | Admitting: Family Medicine

## 2016-06-05 ENCOUNTER — Other Ambulatory Visit: Payer: Self-pay | Admitting: Cardiovascular Disease

## 2016-06-05 DIAGNOSIS — E1159 Type 2 diabetes mellitus with other circulatory complications: Secondary | ICD-10-CM

## 2016-06-10 ENCOUNTER — Other Ambulatory Visit (INDEPENDENT_AMBULATORY_CARE_PROVIDER_SITE_OTHER): Payer: Medicare Other

## 2016-06-10 DIAGNOSIS — E1159 Type 2 diabetes mellitus with other circulatory complications: Secondary | ICD-10-CM | POA: Diagnosis not present

## 2016-06-10 LAB — HEMOGLOBIN A1C: HEMOGLOBIN A1C: 7.7 % — AB (ref 4.6–6.5)

## 2016-06-15 ENCOUNTER — Other Ambulatory Visit: Payer: Self-pay | Admitting: Family Medicine

## 2016-06-16 ENCOUNTER — Ambulatory Visit (INDEPENDENT_AMBULATORY_CARE_PROVIDER_SITE_OTHER): Payer: Medicare Other | Admitting: Family Medicine

## 2016-06-16 ENCOUNTER — Encounter: Payer: Self-pay | Admitting: Family Medicine

## 2016-06-16 VITALS — BP 180/80 | HR 60 | Temp 97.5°F | Wt 139.2 lb

## 2016-06-16 DIAGNOSIS — I10 Essential (primary) hypertension: Secondary | ICD-10-CM

## 2016-06-16 DIAGNOSIS — L989 Disorder of the skin and subcutaneous tissue, unspecified: Secondary | ICD-10-CM

## 2016-06-16 DIAGNOSIS — I2510 Atherosclerotic heart disease of native coronary artery without angina pectoris: Secondary | ICD-10-CM | POA: Diagnosis not present

## 2016-06-16 DIAGNOSIS — E1159 Type 2 diabetes mellitus with other circulatory complications: Secondary | ICD-10-CM

## 2016-06-16 DIAGNOSIS — Z23 Encounter for immunization: Secondary | ICD-10-CM | POA: Diagnosis not present

## 2016-06-16 DIAGNOSIS — W5503XA Scratched by cat, initial encounter: Secondary | ICD-10-CM

## 2016-06-16 DIAGNOSIS — T148 Other injury of unspecified body region: Secondary | ICD-10-CM

## 2016-06-16 MED ORDER — CEPHALEXIN 500 MG PO CAPS: 500.0000 mg | ORAL_CAPSULE | Freq: Four times a day (QID) | ORAL | 0 refills | Status: DC

## 2016-06-16 NOTE — Progress Notes (Signed)
Daughter's cat (vaccinated) scratcher her R arm today.  4 lacs- 1.5cm x2, 1cm, 2cm.  Shallow.  Tetanus today.  D/w pt.    Also with 60mm lesion on the R elbow.  Present for month or so.  She'll scratch it off but it will come back.    BP elevation.  Med changes per cards.  I deferred to cardiology. Has been [id number] systolic at home generally.  She'll check with cardiology.    Diabetes:  Using medications without difficulties:yes Hypoglycemic episodes:no Hyperglycemic episodes:no Feet problems:no Blood Sugars averaging: usually ~90-100s in the AM.  This is typical for patient.  eye exam within last year:yes A1c much improved down to 7.7.  Still working on diet and cutting out bread.   PMH and SH reviewed  Meds, vitals, and allergies reviewed.   ROS: Per HPI unless specifically indicated in ROS section   GEN: nad, alert and oriented HEENT: mucous membranes moist NECK: supple w/o LA CV: rrr. PULM: ctab, no inc wob ABD: soft, +bs EXT: no edema SKIN: no acute rash but 4 lacs- 1.5cm x2, 1cm, 2cm, on the R forearm.  Shallow and superficial.  5 mm lesion noted on the right elbow. This looks to be an irritated SK, or possibly an actinic keratosis. No ulceration.

## 2016-06-16 NOTE — Patient Instructions (Addendum)
Call cardiology if you don't hear from them first.   Start keflex today.  Keep your arm clean and covered.  Update me as needed.  Recheck A1c in about 3 months.   Take care.  Glad to see you.

## 2016-06-16 NOTE — Progress Notes (Signed)
Pre visit review using our clinic review tool, if applicable. No additional management support is needed unless otherwise documented below in the visit note. 

## 2016-06-17 DIAGNOSIS — W5503XA Scratched by cat, initial encounter: Secondary | ICD-10-CM | POA: Insufficient documentation

## 2016-06-17 NOTE — Assessment & Plan Note (Signed)
Elevated on recheck. Has been elevated at home. I notified cardiology, I will defer to cardiology about med changes for blood pressure. Appreciate cardiology help. Patient agrees.

## 2016-06-17 NOTE — Assessment & Plan Note (Signed)
Likely either irritated seborrheic keratosis, or actinic keratosis. Either way reasonable to treat. Discussed with patient about options. She agrees for liquid nitrogen treatment. Frozen 3 without complication. Routine instructions given. She agrees.

## 2016-06-17 NOTE — Assessment & Plan Note (Signed)
She has been working on diet. A1c improved. Not controlled, but improved. Continue as is. No change in medications. Recheck in about 3 months.

## 2016-06-17 NOTE — Assessment & Plan Note (Signed)
Discussed with patient. We should not suture. Lesion looks clean. Shallow, superficial. Cleaned with saline and dilute iodine. Skin flaps reapproximated. Covered with nonstick bandage and antibiotic ointment. Covered with roll gauze. Change daily. Tetanus today. Start Keflex. Follow-up as necessary. Routine cautions given. She agrees.

## 2016-06-18 ENCOUNTER — Telehealth: Payer: Self-pay

## 2016-06-18 MED ORDER — AMLODIPINE BESYLATE 5 MG PO TABS: 5.0000 mg | ORAL_TABLET | Freq: Every day | ORAL | 11 refills | Status: DC

## 2016-06-18 NOTE — Telephone Encounter (Signed)
Returning your call. °

## 2016-06-18 NOTE — Telephone Encounter (Signed)
lmtcb

## 2016-06-18 NOTE — Telephone Encounter (Signed)
Called patient with recommendations. Patient verbalized understanding and agreed with plan. Appointment made for patient to see pharmacy on 07/06/16 at 3p.

## 2016-06-18 NOTE — Telephone Encounter (Signed)
-----   Message from Sanda Klein, MD sent at 06/16/2016  6:17 PM EDT ----- Enid Cutter, Please call and amlodipine 5 mg daily and have her come back to see [facility] or [place] with home recording of blood pressure readings in 2 weeks Thanks! MCr ----- Message ----- From: Tonia Ghent, MD Sent: 06/16/2016  12:03 PM To: Sanda Klein, MD  Please contact patient.  BP has been persistently elevated, systolic 123456.  I didn't change meds at OV.  Thanks.   Brigitte Pulse

## 2016-06-24 ENCOUNTER — Other Ambulatory Visit: Payer: Self-pay | Admitting: Cardiovascular Disease

## 2016-06-24 NOTE — Telephone Encounter (Signed)
Rx(s) sent to pharmacy electronically.  

## 2016-07-03 ENCOUNTER — Other Ambulatory Visit: Payer: Self-pay | Admitting: Family Medicine

## 2016-07-06 ENCOUNTER — Ambulatory Visit (INDEPENDENT_AMBULATORY_CARE_PROVIDER_SITE_OTHER): Payer: Medicare Other | Admitting: Pharmacist Clinician (PhC)/ Clinical Pharmacy Specialist

## 2016-07-06 ENCOUNTER — Other Ambulatory Visit: Payer: Self-pay | Admitting: Cardiovascular Disease

## 2016-07-06 VITALS — BP 142/68 | HR 65

## 2016-07-06 DIAGNOSIS — I10 Essential (primary) hypertension: Secondary | ICD-10-CM

## 2016-07-06 DIAGNOSIS — I25708 Atherosclerosis of coronary artery bypass graft(s), unspecified, with other forms of angina pectoris: Secondary | ICD-10-CM

## 2016-07-06 LAB — BASIC METABOLIC PANEL
BUN: 23 mg/dL (ref 7–25)
CALCIUM: 9.4 mg/dL (ref 8.6–10.4)
CO2: 29 mmol/L (ref 20–31)
CREATININE: 1.26 mg/dL — AB (ref 0.60–0.93)
Chloride: 102 mmol/L (ref 98–110)
GLUCOSE: 228 mg/dL — AB (ref 65–99)
Potassium: 5.1 mmol/L (ref 3.5–5.3)
Sodium: 142 mmol/L (ref 135–146)

## 2016-07-06 MED ORDER — AMLODIPINE BESYLATE 5 MG PO TABS: 10.0000 mg | ORAL_TABLET | Freq: Every day | ORAL | 6 refills | Status: DC

## 2016-07-06 NOTE — Progress Notes (Signed)
Patient ID: DIVYA NEIGHBOR                 DOB: 1935-08-09                      MRN: [id number]     HPI: [name] is a 81 y.o. female with a PMH as below referred by Dr. Sallyanne Kuster to HTN clinic. Medications taken before visit today. Last visit with Dr. Sallyanne Kuster on 8/11 in which benazapril was increased to 40mg  daily with initiation of amlodipine 5mg  daily. Has been tolerating medication changes well. Denies dizziness, blurred vision, or headache. Checks BP at home daily (see readings below).   PMH: Combined systolic/diastolic CHF, PAF non on anti-coag, HLD, T2DM, PVD, HTN   Current HTN meds: Amlodipine 5mg  daily, benazapril 40mg  daily Previously tried: Diazide 37.5/25mg  BP goal: <150/90 mmHg  Family History: Heart disease mother and father, brothers HTN  Social History:   Diet: Eats out most meals; does not cook at home much. Seldom adds salt to foods. Decaffeinated coffee only, water, diet soda.  Exercise: Overall sedentary  Home BP readings:  High 169/81; Low 122/66; Avg mid 150s  Wt Readings from Last 3 Encounters:  06/16/16 139 lb 4 oz (63.2 kg)  05/15/16 140 lb 12.8 oz (63.9 kg)  03/16/16 140 lb 8 oz (63.7 kg)   BP Readings from Last 3 Encounters:  06/16/16 (!) 180/80  05/15/16 (!) 160/80  03/16/16 (!) 150/82   Pulse Readings from Last 3 Encounters:  06/16/16 60  05/15/16 66  03/16/16 66    Renal function: CrCl cannot be calculated (Unknown ideal weight.).  Past Medical History:  Diagnosis Date  . Anemia, iron deficiency   . Atrial fibrillation ([place]) 11/28/2010   2D Echo - EF-30-35, left atrium moderate to severely dilated, right ventricle moderately dilated, tricuspid valve mild-moderate regurgitation  . Cancer [facility])    R beast cancer followed by Dr. Hassell Done  . Chronic kidney disease (CKD), stage II (mild)   . Coronary artery disease   . Diabetes mellitus, type 2 ([place])   . Diverticulitis   . History of breast cancer    Followed yearly by [facility]  .  Hyperlipemia   . Hypertension   . Nontraumatic retroperitoneal hematoma   . Osteoporosis   . Peripheral vascular disease [facility])     Current Outpatient Prescriptions on File Prior to Visit  Medication Sig Dispense Refill  . amLODipine (NORVASC) 5 MG tablet Take 1 tablet (5 mg total) by mouth daily. 30 tablet 11  . aspirin EC 81 MG tablet Take 81 mg by mouth daily.    Marland Kitchen atorvastatin (LIPITOR) 20 MG tablet TAKE 1 TABLET BY MOUTH AT BEDTIME 90 tablet 3  . benazepril (LOTENSIN) 40 MG tablet Take 1 tablet (40 mg total) by mouth daily. 90 tablet 3  . cephALEXin (KEFLEX) 500 MG capsule Take 1 capsule (500 mg total) by mouth 4 (four) times daily. 28 capsule 0  . Cholecalciferol (VITAMIN D) 1000 UNITS capsule Take 1,000 Units by mouth 2 (two) times daily.      Marland Kitchen ezetimibe (ZETIA) 10 MG tablet Take 1 tablet (10 mg total) by mouth every other day. 30 tablet 6  . glyBURIDE (DIABETA) 5 MG tablet TAKE 2 TABLETS (10 MG TOTAL) BY MOUTH DAILY WITH BREAKFAST. 180 tablet 1  . insulin aspart (NOVOLOG) 100 UNIT/ML injection USE PER SLIDING SCALE--200-250=7 UNITS, 251-300=8 UNITS,301-350=9 UNITS, WITH MEALS 10 mL 11  . Insulin Syringe-Needle U-100 (  BD INSULIN SYRINGE ULTRAFINE) 31G X 5/16" 0.3 ML MISC USE DAILY AND AS DIRECTED SLIDING SCALE.  Multiple injections of insulin per day 200 each prn  . meclizine (ANTIVERT) 25 MG tablet Take 1 tablet (25 mg total) by mouth daily as needed. For dizziness 30 tablet 6  . nitroGLYCERIN (NITROSTAT) 0.4 MG SL tablet Place 1 tablet (0.4 mg total) under the tongue as needed for chest pain. As directed 25 tablet 11  . nystatin (MYCOSTATIN/NYSTOP) 100000 UNIT/GM POWD Apply up to 3 times a day if needed. 15 g 5  . ONE TOUCH ULTRA TEST test strip USE TO CHECK BLOOD SUGAR 4 TIMES A DAY. INSULIN-DEPENDENT. DIAGNOSIS: E11.9 100 each 5  . RANEXA 500 MG 12 hr tablet TAKE 1 TABLET (500 MG TOTAL) BY MOUTH 2 (TWO) TIMES DAILY. NEED APPOINTMENT FOR MORE REFILLS 60 tablet 5  . SOTALOL AF 80 MG  TABS Take 0.5 tablets (40 mg total) by mouth 2 (two) times daily. 90 tablet 3   No current facility-administered medications on file prior to visit.     Allergies  Allergen Reactions  . Actos [Pioglitazone] Other (See Comments)    edema  . Metformin And Related     Held due to renal function  . Zocor [Simvastatin] Other (See Comments)    Myalgias     Assessment/Plan: BP much improved although above goal overall. Diet overall poor coupled with sedentary lifestyle. Discussed different therapy options with pt but generally reluctant to initiate new therapy. Agrees to increase amlodipine to 10mg  daily. If unable to tolerate, will reduce back to 5mg  daily. Will send for BMET today as benazapril was increased with most recent K = 4.9 (12/09/15). Will call pt with any necessary changes pending labs.    Stephens November, PharmD Clinical Pharmacist 4:22 PM, 07/06/2016

## 2016-07-06 NOTE — Patient Instructions (Addendum)
Start taking amlodipine 10mg  once daily - the Rx has been sent to [facility]. You may take two 5mg  tablets until gone in the meantime.  If you're not tolerating the 10mg  dose of amlodipine, reduce your dose back to 5mg  daily  We will draw some blood work downstairs today to make sure your potassium levels are normal. If any medication changes are needed, we will give you a call.  Continue logging your blood pressure at home.  Your blood pressure is doing much better! Keep up the good work!

## 2016-07-11 ENCOUNTER — Other Ambulatory Visit: Payer: Self-pay | Admitting: Family Medicine

## 2016-07-14 ENCOUNTER — Emergency Department (HOSPITAL_COMMUNITY): Payer: Medicare Other

## 2016-07-14 ENCOUNTER — Encounter (HOSPITAL_COMMUNITY): Payer: Self-pay | Admitting: *Deleted

## 2016-07-14 ENCOUNTER — Emergency Department (HOSPITAL_COMMUNITY)
Admission: EM | Admit: 2016-07-14 | Discharge: 2016-07-14 | Disposition: A | Discharge status: Home / Self Care | Payer: Medicare Other | Attending: Emergency Medicine | Admitting: Emergency Medicine

## 2016-07-14 DIAGNOSIS — S61411A Laceration without foreign body of right hand, initial encounter: Secondary | ICD-10-CM | POA: Diagnosis not present

## 2016-07-14 DIAGNOSIS — W19XXXA Unspecified fall, initial encounter: Secondary | ICD-10-CM

## 2016-07-14 DIAGNOSIS — S3690XA Unspecified injury of unspecified intra-abdominal organ, initial encounter: Secondary | ICD-10-CM | POA: Diagnosis not present

## 2016-07-14 DIAGNOSIS — I5042 Chronic combined systolic (congestive) and diastolic (congestive) heart failure: Secondary | ICD-10-CM | POA: Insufficient documentation

## 2016-07-14 DIAGNOSIS — Z794 Long term (current) use of insulin: Secondary | ICD-10-CM | POA: Insufficient documentation

## 2016-07-14 DIAGNOSIS — N183 Chronic kidney disease, stage 3 (moderate): Secondary | ICD-10-CM | POA: Insufficient documentation

## 2016-07-14 DIAGNOSIS — S299XXA Unspecified injury of thorax, initial encounter: Secondary | ICD-10-CM | POA: Diagnosis present

## 2016-07-14 DIAGNOSIS — Y92009 Unspecified place in unspecified non-institutional (private) residence as the place of occurrence of the external cause: Secondary | ICD-10-CM | POA: Diagnosis not present

## 2016-07-14 DIAGNOSIS — Y939 Activity, unspecified: Secondary | ICD-10-CM | POA: Diagnosis not present

## 2016-07-14 DIAGNOSIS — R100 Acute abdomen: Secondary | ICD-10-CM | POA: Diagnosis not present

## 2016-07-14 DIAGNOSIS — S0990XA Unspecified injury of head, initial encounter: Secondary | ICD-10-CM | POA: Diagnosis not present

## 2016-07-14 DIAGNOSIS — W108XXA Fall (on) (from) other stairs and steps, initial encounter: Secondary | ICD-10-CM | POA: Insufficient documentation

## 2016-07-14 DIAGNOSIS — I2510 Atherosclerotic heart disease of native coronary artery without angina pectoris: Secondary | ICD-10-CM | POA: Insufficient documentation

## 2016-07-14 DIAGNOSIS — Y999 Unspecified external cause status: Secondary | ICD-10-CM | POA: Insufficient documentation

## 2016-07-14 DIAGNOSIS — Z853 Personal history of malignant neoplasm of breast: Secondary | ICD-10-CM | POA: Diagnosis not present

## 2016-07-14 DIAGNOSIS — Z7984 Long term (current) use of oral hypoglycemic drugs: Secondary | ICD-10-CM | POA: Insufficient documentation

## 2016-07-14 DIAGNOSIS — I130 Hypertensive heart and chronic kidney disease with heart failure and stage 1 through stage 4 chronic kidney disease, or unspecified chronic kidney disease: Secondary | ICD-10-CM | POA: Insufficient documentation

## 2016-07-14 DIAGNOSIS — R0781 Pleurodynia: Secondary | ICD-10-CM | POA: Diagnosis not present

## 2016-07-14 DIAGNOSIS — E1122 Type 2 diabetes mellitus with diabetic chronic kidney disease: Secondary | ICD-10-CM | POA: Diagnosis not present

## 2016-07-14 DIAGNOSIS — Z951 Presence of aortocoronary bypass graft: Secondary | ICD-10-CM | POA: Diagnosis not present

## 2016-07-14 DIAGNOSIS — S199XXA Unspecified injury of neck, initial encounter: Secondary | ICD-10-CM | POA: Diagnosis not present

## 2016-07-14 DIAGNOSIS — Z7982 Long term (current) use of aspirin: Secondary | ICD-10-CM | POA: Diagnosis not present

## 2016-07-14 DIAGNOSIS — S2242XA Multiple fractures of ribs, left side, initial encounter for closed fracture: Secondary | ICD-10-CM | POA: Diagnosis not present

## 2016-07-14 MED ORDER — HYDROCODONE-ACETAMINOPHEN 5-325 MG PO TABS: 1.0000 | ORAL_TABLET | Freq: Four times a day (QID) | ORAL | 0 refills | Status: DC | PRN

## 2016-07-14 MED ORDER — HYDROCODONE-ACETAMINOPHEN 5-325 MG PO TABS
1.0000 | ORAL_TABLET | Freq: Once | ORAL | Status: AC
  Administered 2016-07-14: 1 via ORAL
  Filled 2016-07-14: qty 1

## 2016-07-14 NOTE — ED Notes (Addendum)
Pt reports missing the last step going down the stairs this am, landing on her L side back.  Denies hitting her head.  Pt reports L side pain.  3 skin tears noted to R posterior hand.  No active bleeding noted at this time.  Pt reports pain is worse when moving her L arm across her chest.

## 2016-07-14 NOTE — ED Triage Notes (Signed)
Per EMS, pt from home, missed a step coming down the stair and fell.  Pt reports L shoulder and arm pain and L flank pain.  Pt is A&O x 4.  Denies hitting head.

## 2016-07-14 NOTE — ED Notes (Signed)
Patient transported to CT 

## 2016-07-14 NOTE — ED Notes (Signed)
Patient transported to X-ray 

## 2016-07-14 NOTE — Discharge Instructions (Signed)
Follow-up with your primary care doctor.  Use ice and heat on the area where her ribs are sore

## 2016-07-14 NOTE — ED Notes (Signed)
Family at bedside. 

## 2016-07-17 ENCOUNTER — Encounter: Payer: Self-pay | Admitting: Family Medicine

## 2016-07-17 ENCOUNTER — Ambulatory Visit (INDEPENDENT_AMBULATORY_CARE_PROVIDER_SITE_OTHER): Payer: Medicare Other | Admitting: Family Medicine

## 2016-07-17 DIAGNOSIS — I25708 Atherosclerosis of coronary artery bypass graft(s), unspecified, with other forms of angina pectoris: Secondary | ICD-10-CM | POA: Diagnosis not present

## 2016-07-17 DIAGNOSIS — Z9181 History of falling: Secondary | ICD-10-CM

## 2016-07-17 DIAGNOSIS — S61411S Laceration without foreign body of right hand, sequela: Secondary | ICD-10-CM | POA: Diagnosis not present

## 2016-07-17 DIAGNOSIS — R0789 Other chest pain: Secondary | ICD-10-CM

## 2016-07-17 MED ORDER — HYDROCODONE-ACETAMINOPHEN 5-325 MG PO TABS: 1.0000 | ORAL_TABLET | Freq: Four times a day (QID) | ORAL | 0 refills | Status: DC | PRN

## 2016-07-17 NOTE — Patient Instructions (Signed)
Take care.  Glad to see you.  Keep your hand clean and covered.  Update me as needed.   This should gradually get better but it may take a few weeks.

## 2016-07-17 NOTE — Progress Notes (Signed)
[place] coming down the stairs on 07/14/16.  Hit L side of face, thorax on the fall.  Skin tears on R hand dorsum.  Previous imaging reviewed at the office visit with patient. I agree with the over read of her chest imaging.  She has a fall alert button. She has help at home. She is taking her pain medication with some relief of pain. She still has pain with a deep breath. Discussed with patient about constipation precautions on medication.  ER course was discussed with patient and her son.  Meds, vitals, and allergies reviewed.   ROS: Per HPI unless specifically indicated in ROS section   No apparent distress Normocephalic/atraumatic Mucous membranes moist Neck supple no lymphadenopathy Heart is regular Lungs are clear Left-sided chest is still tender, pain with a deep breath. Abdomen soft Extremities with trace edema. Skin tear on dorsum of right hand noted. Skin is still well approximated. Bandage was changed. Nonstick bandage applied with gauze. Skin tear does not look infected.

## 2016-07-17 NOTE — Progress Notes (Signed)
Pre visit review using our clinic review tool, if applicable. No additional management support is needed unless otherwise documented below in the visit note. 

## 2016-07-19 DIAGNOSIS — R0789 Other chest pain: Secondary | ICD-10-CM | POA: Insufficient documentation

## 2016-07-19 DIAGNOSIS — S61419A Laceration without foreign body of unspecified hand, initial encounter: Secondary | ICD-10-CM | POA: Insufficient documentation

## 2016-07-19 NOTE — Assessment & Plan Note (Signed)
Occurred with the fall. Appears to be healing slowly but normally otherwise. Does not appear infected. Continue local care. Dressed with nonstick bandage roll gauze. Follow-up as needed.

## 2016-07-19 NOTE — Assessment & Plan Note (Signed)
With corresponding rib abnormality noted. Continue hydrocodone as needed with constipation and sedation caution. It does help her pain some. She has some help at home. She will update me as needed. Previous imaging reviewed with the patient at the office visit. I agree with over read. Appreciate help all involved. >25 minutes spent in face to face time with patient, >50% spent in counselling or coordination of care.

## 2016-07-19 NOTE — Assessment & Plan Note (Signed)
She is a fall button. Discussed with patient about fall cautions.

## 2016-07-21 NOTE — ED Provider Notes (Signed)
[place] DEPT Provider Note   CSN: [id number] Arrival date & time: 07/14/16  1117     History   Chief Complaint Chief Complaint  Patient presents with  . Fall  . Shoulder Pain    HPI [name] is a 81 y.o. female.  HPI Patient presents to the emergency department with injuries from a fall.  Patient states she missed the last step and fell, hitting her head and landing on her left side.  The patient states that she is having pain and left side of her back and her left flank.  Patient states the pain is mostly in the flank area but radiates to the left lateral back.  The patient states that nothing seems to make the condition better, movement and palpation makes the pain worse.  Patient states she does have some skin tears on her right hand.  Patient did not take any medications prior to arrivalThe patient denies chest pain, shortness of breath, headache,blurred vision, neck pain, fever, cough, weakness, numbness, dizziness, anorexia, edema, abdominal pain, nausea, vomiting, diarrhea, rash, dysuria, hematemesis, bloody stool, near syncope, or syncope. Past Medical History:  Diagnosis Date  . Anemia, iron deficiency   . Atrial fibrillation ([place]) 11/28/2010   2D Echo - EF-30-35, left atrium moderate to severely dilated, right ventricle moderately dilated, tricuspid valve mild-moderate regurgitation  . 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 Chronic kidney disease (CKD), stage II (mild)   . Coronary artery disease   . Diabetes mellitus, type 2 ([place])   . Diverticulitis   . History of breast cancer    Followed yearly by [facility]  . Hyperlipemia   . Hypertension   . Nontraumatic retroperitoneal hematoma   . Osteoporosis   . Peripheral vascular disease [facility])     Patient Active Problem List   Diagnosis Date Noted  . Skin tear of hand without complication [id number]  . Chest wall pain 07/19/2016  . Cat scratch 06/17/2016  . Neck mass 03/16/2016  . Chronic  combined systolic and diastolic congestive heart failure ([place]) 11/11/2015  . Neck pain 01/09/2015  . Medicare annual wellness visit, initial 01/04/2015  . Advance care planning 01/04/2015  . Skin lesion 08/24/2013  . Risk for falls 08/24/2013  . Cardiomyopathy, ischemic 08/24/2013  . Left tibial fracture 05/05/2013  . Coronary artery disease   . Hyperlipemia   . Constipation 10/10/2012  . Retroperitoneal hematoma 10/09/2012  . Abdominal pain, acute 10/09/2012  . Chronic kidney disease, stage III (moderate) 08/12/2012  . Colostomy in place [facility]) 08/12/2012  . Vitamin D deficiency 11/28/2008  . CAROTID BRUIT 05/24/2008  . ANEMIA-IRON DEFICIENCY 04/20/2007  . Type 2 diabetes mellitus with vascular disease ([place]) 04/19/2007  . HLD (hyperlipidemia) 04/19/2007  . Essential hypertension 04/19/2007  . Coronary atherosclerosis 04/19/2007  . Atrial fibrillation ([place]) 04/19/2007  . Peripheral vascular disease ([place]) 04/19/2007  . OSTEOPOROSIS 04/19/2007    Past Surgical History:  Procedure Laterality Date  . APPENDECTOMY  1962  . BREAST SURGERY  02/01   Quadrectomy Breast cancer (Dr. Hassell Done)  . CARDIAC CATHETERIZATION  2000   Iliac Stent (Dr. Juanita Craver) yearly follow-up  . CARDIAC CATHETERIZATION  01/25/02   High grade LAD disease, total of 3 vess, involvement  . CARDIAC CATHETERIZATION  01/13/07   SV graft to diag. occluded but good backfill o/w ok  . CARDIAC CATHETERIZATION  02/14/10   PTCA & stent vein graft to OM/RCA ([place])  . CARDIAC CATHETERIZATION  04/17/2011  Saphenous vein grafts to the RCA 100% occluded, to the OM 100% occluded, and the diagonal 100% occluded. Internal mammary artery to the LAD, widely patent.  Marland Kitchen CATARACT EXTRACTION  10/10 and 02/11  . COLOSTOMY    . CORONARY ARTERY BYPASS GRAFT  02/09/02   X 4 ([place])  . DILATION AND CURETTAGE OF UTERUS  1971    miscarrage, abnormal bleeding  . DILATION AND CURETTAGE OF UTERUS  1995   Menorrhagia  . HEMORRHOID  SURGERY  09/11/04   Binding Hassell Done)  . PARTIAL COLECTOMY    . SHOULDER ARTHROSCOPY  05/04   Right, with rotator cuff debridement    OB History    No data available       Home Medications    Prior to Admission medications   Medication Sig Start Date End Date Taking? Authorizing Provider  amLODipine (NORVASC) 10 MG tablet Take 10 mg by mouth daily.   Yes Historical Provider, MD  aspirin EC 81 MG tablet Take 81 mg by mouth daily.   Yes Historical Provider, MD  atorvastatin (LIPITOR) 20 MG tablet TAKE 1 TABLET BY MOUTH AT BEDTIME 06/24/16  Yes Mihai Croitoru, MD  benazepril (LOTENSIN) 40 MG tablet Take 1 tablet (40 mg total) by mouth daily. 05/15/16  Yes Mihai Croitoru, MD  Cholecalciferol (VITAMIN D) 1000 UNITS capsule Take 1,000 Units by mouth 2 (two) times daily.     Yes Historical Provider, MD  ezetimibe (ZETIA) 10 MG tablet Take 1 tablet (10 mg total) by mouth every other day. 03/27/16  Yes Mihai Croitoru, MD  glyBURIDE (DIABETA) 5 MG tablet TAKE 2 TABLETS (10 MG TOTAL) BY MOUTH DAILY WITH BREAKFAST. 07/03/16  Yes Tonia Ghent, MD  insulin aspart (NOVOLOG) 100 UNIT/ML injection USE PER SLIDING SCALE--200-250=7 UNITS, 251-300=8 UNITS,301-350=9 UNITS, WITH MEALS 03/16/16  Yes Tonia Ghent, MD  Insulin Syringe-Needle U-100 (BD INSULIN SYRINGE ULTRAFINE) 31G X 5/16" 0.3 ML MISC USE DAILY AND AS DIRECTED SLIDING SCALE.  Multiple injections of insulin per day 12/12/15  Yes Tonia Ghent, MD  meclizine (ANTIVERT) 25 MG tablet Take 1 tablet (25 mg total) by mouth daily as needed. For dizziness 01/03/15  Yes Tonia Ghent, MD  nystatin (MYCOSTATIN/NYSTOP) 100000 UNIT/GM POWD Apply up to 3 times a day if needed. 12/12/15  Yes Tonia Ghent, MD  ONE TOUCH ULTRA TEST test strip USE TO CHECK BLOOD SUGAR 4 TIMES A DAY. INSULIN-DEPENDENT. DIAGNOSIS: E11.9 06/15/16  Yes Tonia Ghent, MD  RANEXA 500 MG 12 hr tablet TAKE 1 TABLET (500 MG TOTAL) BY MOUTH 2 (TWO) TIMES DAILY. NEED APPOINTMENT FOR MORE  REFILLS 06/05/16  Yes Mihai Croitoru, MD  SOTALOL AF 80 MG TABS Take 0.5 tablets (40 mg total) by mouth 2 (two) times daily. 03/12/16  Yes Mihai Croitoru, MD  HYDROcodone-acetaminophen (NORCO/VICODIN) 5-325 MG tablet Take 1 tablet by mouth every 6 (six) hours as needed for moderate pain. 07/17/16   Tonia Ghent, MD  nitroGLYCERIN (NITROSTAT) 0.4 MG SL tablet Place 1 tablet (0.4 mg total) under the tongue as needed for chest pain. As directed 12/12/15   Tonia Ghent, MD    Family History Family History  Problem Relation Age of Onset  . Hypertension Mother   . Diabetes Mother   . Heart disease Mother   . Hypertension Father   . Heart disease Father 57    Heart attack  . Heart disease Brother     CHF  . Hypertension Brother   .  COPD Brother     bronchial, frequent pneumonia  . Hypertension Brother   . Hyperlipidemia Brother   . Heart disease Brother     CABG  . Hypertension Brother   . Gout Brother   . Obesity Brother   . Diabetes Son   . Hypertension Son     Social History Social History  Substance Use Topics  . Smoking status: Never Smoker  . Smokeless tobacco: Never Used  . Alcohol use No     Allergies   Actos [pioglitazone]; Metformin and related; and Zocor [simvastatin]   Review of Systems Review of Systems All other systems negative except as documented in the HPI. All pertinent positives and negatives as reviewed in the HPI.  Physical Exam Updated Vital Signs BP 145/61 (BP Location: Right Arm)   Pulse 70   Temp 98.3 F (36.8 C) (Oral)   Resp 17   Ht 5\' 4"  (1.626 m)   Wt 59 kg   SpO2 94%   BMI 22.31 kg/m   Physical Exam  Constitutional: She is oriented to person, place, and time. She appears well-developed and well-nourished. No distress.  HENT:  Head: Normocephalic and atraumatic.  Mouth/Throat: Oropharynx is clear and moist.  Eyes: Pupils are equal, round, and reactive to light.  Neck: Normal range of motion. Neck supple.  Cardiovascular:  Normal rate, regular rhythm and normal heart sounds.  Exam reveals no gallop and no friction rub.   No murmur heard. Pulmonary/Chest: Effort normal and breath sounds normal. No respiratory distress. She has no wheezes.    Abdominal: Soft. Bowel sounds are normal. She exhibits no distension. There is no tenderness.  Musculoskeletal:       Back:       Hands: Neurological: She is alert and oriented to person, place, and time. She exhibits normal muscle tone. Coordination normal.  Skin: Skin is warm and dry. No rash noted. No erythema.  Psychiatric: She has a normal mood and affect. Her behavior is normal.  Nursing note and vitals reviewed.    ED Treatments / Results  Labs (all labs ordered are listed, but only abnormal results are displayed) Labs Reviewed - No data to display  EKG  EKG Interpretation None       Radiology No results found.  Procedures Procedures (including critical care time)  Medications Ordered in ED Medications  HYDROcodone-acetaminophen (NORCO/VICODIN) 5-325 MG per tablet 1 tablet (1 tablet Oral Given 07/14/16 1543)     Initial Impression / Assessment and Plan / ED Course  I have reviewed the triage vital signs and the nursing notes.  Pertinent labs & imaging results that were available during my care of the patient were reviewed by me and considered in my medical decision making (see chart for details).  Clinical Course  Value Comment By Time  DG Ribs Unilateral W/Chest Left (Reviewed) Dalia Heading, PA-C 10/10 [id number]    Patient be treated for 2 rib fractures noted on x-ray.  Patient is advised follow-up with her primary care Dr. told to return here as needed.  The skin tears on her hand where dressed and cleaned  Final Clinical Impressions(s) / ED Diagnoses   Final diagnoses:  Closed fracture of multiple ribs of left side, initial encounter  Fall, initial encounter    New Prescriptions Discharge Medication List as of 07/14/2016  2:43  PM    START taking these medications   Details  HYDROcodone-acetaminophen (NORCO/VICODIN) 5-325 MG tablet Take 1 tablet by mouth every 6 (six) hours  as needed for moderate pain., Starting Tue 07/14/2016, Print         Dalia Heading, PA-C 07/21/16 1357    Julianne Rice, MD 07/24/16 2330

## 2016-08-06 ENCOUNTER — Encounter: Payer: Self-pay | Admitting: Pharmacist Clinician (PhC)/ Clinical Pharmacy Specialist

## 2016-08-06 ENCOUNTER — Ambulatory Visit (INDEPENDENT_AMBULATORY_CARE_PROVIDER_SITE_OTHER): Payer: Medicare Other | Admitting: Pharmacist Clinician (PhC)/ Clinical Pharmacy Specialist

## 2016-08-06 DIAGNOSIS — I25708 Atherosclerosis of coronary artery bypass graft(s), unspecified, with other forms of angina pectoris: Secondary | ICD-10-CM

## 2016-08-06 DIAGNOSIS — I10 Essential (primary) hypertension: Secondary | ICD-10-CM | POA: Diagnosis not present

## 2016-08-06 NOTE — Progress Notes (Signed)
Patient ID: [name]                 DOB: Aug 10, 1935                      MRN: [id number]     HPI: [name] is a 81 y.o. female with a PMH as below referred by Dr. Sallyanne Farrell for hypertension management.  She was here last month with a BP of 142/68, at which time her amlodipine was increased from 5 mg to 10 mg daily.  She reports no problems or concerns with this dose.  Denies dizziness, blurred vision, or headache. Checks BP at home daily (see readings below).  Since her last visit she had a fall while going down stairs in her house.  She broke several ribs and bruised her left hand.  She has healed up well, and although still tender, states she can now take deep breaths and cough/sneeze without discomfort.    PMH: Combined systolic/diastolic CHF, PAF non on anti-coag, HLD, T2DM, PVD, HTN  Current HTN meds: Amlodipine 10mg  daily, benazapril 40mg  daily  Previously tried: Diazide 37.5/25mg   BP goal: <140/90 mmHg  Family History: Heart disease mother and father, brothers HTN  Social History:  Has never used tobacco, does not drink alcohol  Diet: Eats out most meals; does not cook at home much. Seldom adds salt to foods. Decaffeinated coffee only, water, diet soda.  Exercise: Overall sedentary  Home BP readings: average systolic 123456, diastolic all WNL.  Range 132-156/64-86.     Wt Readings from Last 3 Encounters:  07/17/16 137 lb 12 oz (62.5 kg)  07/14/16 130 lb (59 kg)  06/16/16 139 lb 4 oz (63.2 kg)   BP Readings from Last 3 Encounters:  08/06/16 (!) 144/76  07/17/16 132/64  07/14/16 145/61   Pulse Readings from Last 3 Encounters:  08/06/16 74  07/17/16 88  07/14/16 70    Renal function: CrCl cannot be calculated (Unknown ideal weight.).  Past Medical History:  Diagnosis Date  . Anemia, iron deficiency   . Atrial fibrillation ([place]) 11/28/2010   2D Echo - EF-30-35, left atrium moderate to severely dilated, right ventricle moderately dilated, tricuspid valve  mild-moderate regurgitation  . 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 Hassell Done  . Chronic kidney disease (CKD), stage II (mild)   . Coronary artery disease   . Diabetes mellitus, type 2 ([place])   . Diverticulitis   . History of breast cancer    Followed yearly by [facility]  . Hyperlipemia   . Hypertension   . Nontraumatic retroperitoneal hematoma   . Osteoporosis   . Peripheral vascular disease [facility])     Current Outpatient Prescriptions on File Prior to Visit  Medication Sig Dispense Refill  . amLODipine (NORVASC) 10 MG tablet Take 10 mg by mouth daily.    Marland Kitchen aspirin EC 81 MG tablet Take 81 mg by mouth daily.    Marland Kitchen atorvastatin (LIPITOR) 20 MG tablet TAKE 1 TABLET BY MOUTH AT BEDTIME 90 tablet 3  . benazepril (LOTENSIN) 40 MG tablet Take 1 tablet (40 mg total) by mouth daily. 90 tablet 3  . Cholecalciferol (VITAMIN D) 1000 UNITS capsule Take 1,000 Units by mouth 2 (two) times daily.      Marland Kitchen ezetimibe (ZETIA) 10 MG tablet Take 1 tablet (10 mg total) by mouth every other day. 30 tablet 6  . glyBURIDE (DIABETA) 5 MG tablet TAKE 2 TABLETS (10 MG  TOTAL) BY MOUTH DAILY WITH BREAKFAST. 180 tablet 1  . HYDROcodone-acetaminophen (NORCO/VICODIN) 5-325 MG tablet Take 1 tablet by mouth every 6 (six) hours as needed for moderate pain. 20 tablet 0  . insulin aspart (NOVOLOG) 100 UNIT/ML injection USE PER SLIDING SCALE--200-250=7 UNITS, 251-300=8 UNITS,301-350=9 UNITS, WITH MEALS 10 mL 11  . Insulin Syringe-Needle U-100 (BD INSULIN SYRINGE ULTRAFINE) 31G X 5/16" 0.3 ML MISC USE DAILY AND AS DIRECTED SLIDING SCALE.  Multiple injections of insulin per day 200 each prn  . meclizine (ANTIVERT) 25 MG tablet Take 1 tablet (25 mg total) by mouth daily as needed. For dizziness 30 tablet 6  . nitroGLYCERIN (NITROSTAT) 0.4 MG SL tablet Place 1 tablet (0.4 mg total) under the tongue as needed for chest pain. As directed 25 tablet 11  . nystatin (MYCOSTATIN/NYSTOP) 100000 UNIT/GM POWD Apply up to 3 times a day if  needed. 15 g 5  . ONE TOUCH ULTRA TEST test strip USE TO CHECK BLOOD SUGAR 4 TIMES A DAY. INSULIN-DEPENDENT. DIAGNOSIS: E11.9 100 each 5  . RANEXA 500 MG 12 hr tablet TAKE 1 TABLET (500 MG TOTAL) BY MOUTH 2 (TWO) TIMES DAILY. NEED APPOINTMENT FOR MORE REFILLS 60 tablet 5  . SOTALOL AF 80 MG TABS Take 0.5 tablets (40 mg total) by mouth 2 (two) times daily. 90 tablet 3   No current facility-administered medications on file prior to visit.     Allergies  Allergen Reactions  . Actos [Pioglitazone] Other (See Comments)    edema  . Metformin And Related     Held due to renal function  . Zocor [Simvastatin] Other (See Comments)    Myalgias     Assessment/Plan: Her BP is relatively unchanged since her last visit.  She did have some higher readings after her fall, but discounting those, the majority were still in the 140 range.  I would not recommend aggressive blood pressure lowering her because of age and fall risk.  She will continue with home monitoring, and knows to call should the readings increase to consistently in the 150 or higher range.    Tommy Medal PharmD CPP [place] [facility] 4:19 PM, 08/06/2016

## 2016-08-06 NOTE — Patient Instructions (Signed)
Call if you have any concerns or your blood pressure becomes elevated on several occasions.  Jamilah Jean/Kelley [phone]  Your blood pressure today is 144/76  (goal is < 140/90)  Check your blood pressure at home several times each week and keep record of the readings.  Take your BP meds as follows:  Continue with all current medications  Bring all of your meds, your BP cuff and your record of home blood pressures to your next appointment.  Exercise as you're able, try to walk approximately 30 minutes per day.  Keep salt intake to a minimum, especially watch canned and prepared boxed foods.  Eat more fresh fruits and vegetables and fewer canned items.  Avoid eating in fast food restaurants.    HOW TO TAKE YOUR BLOOD PRESSURE: . Rest 5 minutes before taking your blood pressure. .  Don't smoke or drink caffeinated beverages for at least 30 minutes before. . Take your blood pressure before (not after) you eat. . Sit comfortably with your back supported and both feet on the floor (don't cross your legs). . Elevate your arm to heart level on a table or a desk. . Use the proper sized cuff. It should fit smoothly and snugly around your bare upper arm. There should be enough room to slip a fingertip under the cuff. The bottom edge of the cuff should be 1 inch above the crease of the elbow. . Ideally, take 3 measurements at one sitting and record the average.

## 2016-08-06 NOTE — Assessment & Plan Note (Signed)
Her BP is relatively unchanged since her last visit.  She did have some higher readings after her fall, but discounting those, the majority were still in the 140 range.  I would not recommend aggressive blood pressure lowering her because of age and fall risk.  She will continue with home monitoring, and knows to call should the readings increase to consistently in the 150 or higher range.

## 2016-09-11 ENCOUNTER — Other Ambulatory Visit (INDEPENDENT_AMBULATORY_CARE_PROVIDER_SITE_OTHER): Payer: Medicare Other

## 2016-09-11 DIAGNOSIS — E1159 Type 2 diabetes mellitus with other circulatory complications: Secondary | ICD-10-CM

## 2016-09-11 LAB — HEMOGLOBIN A1C: Hgb A1c MFr Bld: 8.3 % — ABNORMAL HIGH (ref 4.6–6.5)

## 2016-09-15 ENCOUNTER — Ambulatory Visit (INDEPENDENT_AMBULATORY_CARE_PROVIDER_SITE_OTHER): Payer: Medicare Other | Admitting: Family Medicine

## 2016-09-15 ENCOUNTER — Encounter: Payer: Self-pay | Admitting: Family Medicine

## 2016-09-15 DIAGNOSIS — I25708 Atherosclerosis of coronary artery bypass graft(s), unspecified, with other forms of angina pectoris: Secondary | ICD-10-CM | POA: Diagnosis not present

## 2016-09-15 DIAGNOSIS — R634 Abnormal weight loss: Secondary | ICD-10-CM | POA: Diagnosis not present

## 2016-09-15 DIAGNOSIS — E1159 Type 2 diabetes mellitus with other circulatory complications: Secondary | ICD-10-CM

## 2016-09-15 NOTE — Progress Notes (Signed)
Diabetes:  Using medications without difficulties:yes Hypoglycemic episodes: no Hyperglycemic episodes: occ higher, up to 300s occ, after  Feet problems: no Blood Sugars averaging: usually sugar ~120s in the AM.  eye exam within last year: yes A1c d/w pt.  A1c back up in the meantime.   D/w pt about options re: meds.   She has occ missed a dose of mealtime insulin, but not often.    Weight loss, down 17lbs in the last 9 months.  No FCNAVD. She feels well o/w. Some weight loss noted after recent injury, about 6 lbs down in the meantime.  D/w pt.  She deferred further w/u for now, since she was feeling well.  D/w pt about pros and cons of w/u at this point.  She agrees.   Her R hand healed in the meantime.   PMH and SH reviewed  Meds, vitals, and allergies reviewed.   ROS: Per HPI unless specifically indicated in ROS section   GEN: nad, alert and oriented HEENT: mucous membranes moist NECK: supple w/o LA CV: rrr. PULM: ctab, no inc wob ABD: soft, +bs EXT: no edema SKIN: no acute rash Kyphosis noted.   Trace BLE edema.

## 2016-09-15 NOTE — Patient Instructions (Signed)
Recheck labs before a visit in about 3 months. Take care.  Glad to see you. If you have more weight loss in the meantime, then let me know and we'll work on it sooner rather than later.

## 2016-09-15 NOTE — Progress Notes (Signed)
Pre visit review using our clinic review tool, if applicable. No additional management support is needed unless otherwise documented below in the visit note. 

## 2016-09-16 DIAGNOSIS — R634 Abnormal weight loss: Secondary | ICD-10-CM | POA: Insufficient documentation

## 2016-09-16 NOTE — Assessment & Plan Note (Signed)
A1c back up. Discussed with patient. Her morning sugars are usually okay. Discussed with her about her mealtime medication. She will work on diet in the meantime. If no improvement she will let me know. Okay to recheck in a few months otherwise. She agrees.

## 2016-09-16 NOTE — Assessment & Plan Note (Signed)
She has no obvious focal source at this point. Discussed with patient. She is feeling well otherwise. She wanted to defer workup at this point. If she has anymore weight loss, then she will update me and we can initiate workup at that point. Pros and cons of this approach discussed with patient, she wanted to proceed this way. I think this is reasonable, since she is still feeling well

## 2016-10-14 ENCOUNTER — Other Ambulatory Visit: Payer: Self-pay | Admitting: Cardiovascular Disease

## 2016-10-14 NOTE — Telephone Encounter (Signed)
Rx(s) sent to pharmacy electronically.  

## 2016-11-13 ENCOUNTER — Ambulatory Visit (INDEPENDENT_AMBULATORY_CARE_PROVIDER_SITE_OTHER): Payer: Medicare Other | Admitting: Cardiovascular Disease

## 2016-11-13 ENCOUNTER — Encounter: Payer: Self-pay | Admitting: Cardiovascular Disease

## 2016-11-13 VITALS — BP 117/70 | HR 69 | Ht 64.0 in | Wt 125.8 lb

## 2016-11-13 DIAGNOSIS — E1159 Type 2 diabetes mellitus with other circulatory complications: Secondary | ICD-10-CM

## 2016-11-13 DIAGNOSIS — I5042 Chronic combined systolic (congestive) and diastolic (congestive) heart failure: Secondary | ICD-10-CM | POA: Diagnosis not present

## 2016-11-13 DIAGNOSIS — I209 Angina pectoris, unspecified: Secondary | ICD-10-CM

## 2016-11-13 DIAGNOSIS — I739 Peripheral vascular disease, unspecified: Secondary | ICD-10-CM

## 2016-11-13 DIAGNOSIS — E785 Hyperlipidemia, unspecified: Secondary | ICD-10-CM

## 2016-11-13 DIAGNOSIS — I25708 Atherosclerosis of coronary artery bypass graft(s), unspecified, with other forms of angina pectoris: Secondary | ICD-10-CM | POA: Diagnosis not present

## 2016-11-13 DIAGNOSIS — Z79899 Other long term (current) drug therapy: Secondary | ICD-10-CM

## 2016-11-13 DIAGNOSIS — I480 Paroxysmal atrial fibrillation: Secondary | ICD-10-CM

## 2016-11-13 DIAGNOSIS — I10 Essential (primary) hypertension: Secondary | ICD-10-CM

## 2016-11-13 MED ORDER — BENAZEPRIL HCL 20 MG PO TABS: 20.0000 mg | ORAL_TABLET | Freq: Every day | ORAL | 11 refills | Status: DC

## 2016-11-13 NOTE — Progress Notes (Signed)
Patient ID: CHARRISSE Farrell, female   DOB: 02-24-1935, 82 y.o.   MRN: [id number]    Cardiology Office Note    Date:  11/13/2016   ID:  [name], DOB [birth year], MRN [id number]  PCP:  [name], MD  Cardiologist:   Sanda Klein, MD   Chief Complaint  Patient presents with  . Follow-up    History of Present Illness:  IANTHA Farrell is a 82 y.o. female who presents for Follow-up for coronary artery disease status post previous bypass surgery with exertional angina , paroxysmal atrial fibrillation on sotalol therapy , not on anticoagulation due to history of spontaneous retroperitoneal hematoma, treated hypertension, hyperlipidemia and diabetes mellitus complicated by PAD and chronic kidney disease stage II.  Since her last appointment she had a serious fall and fractured several ribs on the left side. She has had trouble coughing and mobilizing secretions since then. This happened when she missed a step on the stairs in her home. She has had some difficulty with taking deep breaths since that time. She denies exertional angina, dyspnea, leg edema but complains about poor stamina and feeling sleepy all the time. Her appetite has been poor and she has lost 12 pounds since October and is now borderline underweight.  She is on combination therapy with a very low dose of sotalol as well as Ranexa and has not had clinical evidence of recurrent atrial fibrillation. Her QTc interval is 437 ms today.  [name] is a 82 year old woman with a history of coronary disease, now 12 years status post post bypass surgery. (interval total occlusion 3 saphenous vein grafts, patent LIMA to LAD, total occlusion of the major OM artery and the right coronary artery with some collateral filling). Ranexa has had a substantial impact with virtual abolition of exertional angina pectoris.  She has a history of paroxysmal atrial fibrillation. While on treatment with aspirin and Plavix she developed a spontaneous  retroperitoneal hematoma. She is therefore not taking anticoagulants. Ranexa may also be helping with a reduction in the burden of atrial fibrillation, without signs of QT interval prolongation. She has a moderate to severely dilated left atrium. Most recent EF assessment was >60% by scintigraphy and LV angiography in 2012, NYHA class II symptoms of congestive heart failure. She has a mild to moderate degree of mitral insufficiency.   Also has type 2 diabetes mellitus, mixed hyperlipidemia and systemic hypertension. She has had several serious falls and injuries due to poor balance, not syncope. She still lives alone  Past Medical History:  Diagnosis Date  . Anemia, iron deficiency   . Atrial fibrillation ([place]) 11/28/2010   2D Echo - EF-30-35, left atrium moderate to severely dilated, right ventricle moderately dilated, tricuspid valve mild-moderate regurgitation  . Cancer [facility])    R beast cancer followed by Dr. Hassell Done  . Chronic kidney disease (CKD), stage II (mild)   . Coronary artery disease   . Diabetes mellitus, type 2 ([place])   . Diverticulitis   . History of breast cancer    Followed yearly by CCS  . Hyperlipemia   . Hypertension   . Nontraumatic retroperitoneal hematoma   . Osteoporosis   . Peripheral vascular disease [facility])     Past Surgical History:  Procedure Laterality Date  . APPENDECTOMY  1962  . BREAST SURGERY  02/01   Quadrectomy Breast cancer (Dr. Hassell Done)  . CARDIAC CATHETERIZATION  2000   Iliac Stent (Dr. Juanita Craver) yearly follow-up  . CARDIAC CATHETERIZATION  01/25/02  High grade LAD disease, total of 3 vess, involvement  . CARDIAC CATHETERIZATION  01/13/07   SV graft to diag. occluded but good backfill o/w ok  . CARDIAC CATHETERIZATION  02/14/10   PTCA & stent vein graft to OM/RCA ([place])  . CARDIAC CATHETERIZATION  04/17/2011   Saphenous vein grafts to the RCA 100% occluded, to the OM 100% occluded, and the diagonal 100% occluded. Internal mammary artery to the  LAD, widely patent.  Marland Kitchen CATARACT EXTRACTION  10/10 and 02/11  . COLOSTOMY    . CORONARY ARTERY BYPASS GRAFT  02/09/02   X 4 ([place])  . DILATION AND CURETTAGE OF UTERUS  1971    miscarrage, abnormal bleeding  . DILATION AND CURETTAGE OF UTERUS  1995   Menorrhagia  . HEMORRHOID SURGERY  09/11/04   Binding Hassell Done)  . PARTIAL COLECTOMY    . SHOULDER ARTHROSCOPY  05/04   Right, with rotator cuff debridement    Outpatient Medications Prior to Visit  Medication Sig Dispense Refill  . amLODipine (NORVASC) 10 MG tablet Take 10 mg by mouth daily.    Marland Kitchen aspirin EC 81 MG tablet Take 81 mg by mouth daily.    Marland Kitchen atorvastatin (LIPITOR) 20 MG tablet TAKE 1 TABLET BY MOUTH AT BEDTIME 90 tablet 3  . Cholecalciferol (VITAMIN D) 1000 UNITS capsule Take 1,000 Units by mouth 2 (two) times daily.      Marland Kitchen glyBURIDE (DIABETA) 5 MG tablet TAKE 2 TABLETS (10 MG TOTAL) BY MOUTH DAILY WITH BREAKFAST. 180 tablet 1  . insulin aspart (NOVOLOG) 100 UNIT/ML injection USE PER SLIDING SCALE--200-250=7 UNITS, 251-300=8 UNITS,301-350=9 UNITS, WITH MEALS 10 mL 11  . Insulin Syringe-Needle U-100 (BD INSULIN SYRINGE ULTRAFINE) 31G X 5/16" 0.3 ML MISC USE DAILY AND AS DIRECTED SLIDING SCALE.  Multiple injections of insulin per day 200 each prn  . meclizine (ANTIVERT) 25 MG tablet Take 1 tablet (25 mg total) by mouth daily as needed. For dizziness 30 tablet 6  . nitroGLYCERIN (NITROSTAT) 0.4 MG SL tablet Place 1 tablet (0.4 mg total) under the tongue as needed for chest pain. As directed 25 tablet 11  . nystatin (MYCOSTATIN/NYSTOP) 100000 UNIT/GM POWD Apply up to 3 times a day if needed. 15 g 5  . ONE TOUCH ULTRA TEST test strip USE TO CHECK BLOOD SUGAR 4 TIMES A DAY. INSULIN-DEPENDENT. DIAGNOSIS: E11.9 100 each 5  . RANEXA 500 MG 12 hr tablet TAKE 1 TABLET (500 MG TOTAL) BY MOUTH 2 (TWO) TIMES DAILY. NEED APPOINTMENT FOR MORE REFILLS 60 tablet 5  . SOTALOL AF 80 MG TABS Take 0.5 tablets (40 mg total) by mouth 2 (two) times  daily. 90 tablet 3  . benazepril (LOTENSIN) 40 MG tablet Take 1 tablet (40 mg total) by mouth daily. 90 tablet 3  . ezetimibe (ZETIA) 10 MG tablet Take 1 tablet (10 mg total) by mouth every other day. 30 tablet 6  . amLODipine (NORVASC) 10 MG tablet TAKE 1 TABLET (10 MG TOTAL) BY MOUTH DAILY. (Patient not taking: Reported on 11/13/2016) 30 tablet 7   No facility-administered medications prior to visit.      Allergies:   Actos [pioglitazone]; Metformin and related; and Zocor [simvastatin]   Social History   Social History  . Marital status: Widowed    Spouse name: N/A  . Number of children: 2  . Years of education: N/A   Occupational History  . Retired    Social History Main Topics  . Smoking status: Never Smoker  .  Smokeless tobacco: Never Used  . Alcohol use No  . Drug use: No  . Sexual activity: No   Other Topics Concern  . None   Social History Narrative   Retired 1991 from [employer] work   Widowed as of 04/2011 after 50+ years   Lives alone   2 kids in [place]   Enjoys travelling and reading     Family History:  The patient's family history includes COPD in her brother; Diabetes in her mother and son; Gout in her brother; Heart disease in her brother, brother, and mother; Heart disease (age of onset: 65) in her father; Hyperlipidemia in her brother; Hypertension in her brother, brother, brother, father, mother, and son; Obesity in her brother.   ROS:   Please see the history of present illness.    ROS All other systems reviewed and are negative.   PHYSICAL EXAM:   VS:  BP 117/70 (BP Location: Right Arm, Patient Position: Sitting, Cuff Size: Normal)   Pulse 69   Ht 5\' 4"  (1.626 m)   Wt 57.1 kg (125 lb 12.8 oz)   BMI 21.59 kg/m    GEN: Well nourished, well developed, in no acute distress  HEENT: normal  Neck: no JVD, carotid bruits, or masses Cardiac: RRR; Grade 1/6 soft systolic murmur at the apex with limited radiation to axilla, no diastolic  murmurs, rubs, or gallops, no edema , healed sternotomy scar Respiratory:  clear to auscultation bilaterally, normal work of breathing GI: soft, nontender, nondistended, + BS. Large left lower quadrant nonreducible hernia at site of colostomy MS: no deformity or atrophy  Skin: warm and dry, no rash Neuro:  Alert and Oriented x 3, Strength and sensation are intact Psych: euthymic mood, full affect  Wt Readings from Last 3 Encounters:  11/13/16 57.1 kg (125 lb 12.8 oz)  09/15/16 59.8 kg (131 lb 12 oz)  07/17/16 62.5 kg (137 lb 12 oz)      Studies/Labs Reviewed:   EKG:  EKG is ordered today.  The ekg ordered today demonstrates sinus rhythm, Poor R-wave progression V1-V4, no acute repolarization changes QTC 437 ms  Recent Labs: 12/09/2015: ALT 10 07/06/2016: BUN 23; Creat 1.26; Potassium 5.1; Sodium 142   Lipid Panel    Component Value Date/Time   CHOL 134 12/09/2015 0853   TRIG 102.0 12/09/2015 0853   HDL 42.60 12/09/2015 0853   CHOLHDL 3 12/09/2015 0853   VLDL 20.4 12/09/2015 0853   LDLCALC 71 12/09/2015 0853   LDLDIRECT 87.3 11/21/2008 1446    ASSESSMENT:    1. Chronic combined systolic and diastolic congestive heart failure ([place])   2. Coronary artery disease of bypass graft of native heart with stable angina pectoris (HCC)   3. Paroxysmal atrial fibrillation ([place])   4. Dyslipidemia   5. Type 2 diabetes mellitus with vascular disease ([place])   6. Peripheral vascular disease ([place])   7. Essential hypertension   8. Medication management      PLAN:  In order of problems listed above:  1. CHF: She describes NYHA functional class I-II exertional dyspnea. She had a mild decompensation when taking Actos, but this has resolved. Prefer to keep off diuretics if possible since she is taking sotalol.  2. CAD: No angina on Ranexa, CCS class 1. She is essentially dependent on the LIMA to LAD bypass graft, with occlusion of all 3 vein grafts as well as the native oblique marginal and  native right coronary artery. There were no good options for  either percutaneous or surgical revascularization when she last had a heart catheterization in 2012. 3. PAF: Very well controlled symptomatically on sotalol and Ranexa. QT interval is not even prolonged. Reviewed the risk of drug interactions if she gets new prescriptions. On aspirin only to history of severe retroperitoneal bleeding when on aspirin/clopidogrel combination. This appears in even more appropriate decision after her recent serious fall. CHADSVasc 7 (age 50, gender, diabetes, hypertension, heart failure, vascular disease). Thankfully no history of stroke or TIA 4. HLP: All lipid parameters in satisfactory range. We'll stop the ezetimibe which has become a financial burden. I suspect her lipid profile will be even better now that she has lost so much weight. 5. DM: Improved glycemic control,, A1c 7.7% in September 2017. She is on insulin. She has lost a lot of weight and may need further adjustment of her insulin regimen 6. PAD: Currently asymptomatic 7. HTN: Now with excellent blood pressure control on current medications. We increase the benazepril dose at her last appointment, but she has had problems with fatigue and poor stamina. She has lost a lot of weight. We will cut back the dose to 20 mg daily again.     Medication Adjustments/Labs and Tests Ordered: Current medicines are reviewed at length with the patient today.  Concerns regarding medicines are outlined above.  Medication changes, Labs and Tests ordered today are listed in the Patient Instructions below. Patient Instructions  Dr Sallyanne Kuster has recommended making the following medication changes: 1. STOP Zetia 2. DECREASE Benazepril to 20 mg daily  Your physician recommends that you return for lab work at your [facility]. Please take the paper work provided with you to the lab when you have blood work done in March for Dr Damita Dunnings.  Dr Sallyanne Kuster recommends  that you schedule a follow-up appointment in 6 months. You will receive a reminder letter in the mail two months in advance. If you don't receive a letter, please call our office to schedule the follow-up appointment.  If you need a refill on your cardiac medications before your next appointment, please call your pharmacy.      Signed, Sanda Klein, MD  11/13/2016 1:44 PM    [facility] [address], [place], [place]  [address] Phone: [phone]; Fax: [phone]

## 2016-11-13 NOTE — Patient Instructions (Signed)
Dr Sallyanne Kuster has recommended making the following medication changes: 1. STOP Zetia 2. DECREASE Benazepril to 20 mg daily  Your physician recommends that you return for lab work at your [facility]. Please take the paper work provided with you to the lab when you have blood work done in March for Dr Damita Dunnings.  Dr Sallyanne Kuster recommends that you schedule a follow-up appointment in 6 months. You will receive a reminder letter in the mail two months in advance. If you don't receive a letter, please call our office to schedule the follow-up appointment.  If you need a refill on your cardiac medications before your next appointment, please call your pharmacy.

## 2016-11-20 ENCOUNTER — Inpatient Hospital Stay (HOSPITAL_COMMUNITY)
Admission: EM | Admit: 2016-11-20 | Discharge: 2016-11-25 | DRG: 193 | Disposition: A | Discharge status: Home Health | Payer: Medicare Other | Attending: Internal Medicine | Admitting: Internal Medicine

## 2016-11-20 ENCOUNTER — Encounter: Payer: Self-pay | Admitting: Family Medicine

## 2016-11-20 ENCOUNTER — Ambulatory Visit (INDEPENDENT_AMBULATORY_CARE_PROVIDER_SITE_OTHER): Payer: Medicare Other | Admitting: Family Medicine

## 2016-11-20 ENCOUNTER — Encounter (HOSPITAL_COMMUNITY): Payer: Self-pay

## 2016-11-20 ENCOUNTER — Emergency Department (HOSPITAL_COMMUNITY): Payer: Medicare Other

## 2016-11-20 DIAGNOSIS — R05 Cough: Secondary | ICD-10-CM | POA: Diagnosis not present

## 2016-11-20 DIAGNOSIS — E874 Mixed disorder of acid-base balance: Secondary | ICD-10-CM | POA: Diagnosis present

## 2016-11-20 DIAGNOSIS — E785 Hyperlipidemia, unspecified: Secondary | ICD-10-CM | POA: Diagnosis present

## 2016-11-20 DIAGNOSIS — E875 Hyperkalemia: Secondary | ICD-10-CM | POA: Diagnosis present

## 2016-11-20 DIAGNOSIS — Z888 Allergy status to other drugs, medicaments and biological substances status: Secondary | ICD-10-CM

## 2016-11-20 DIAGNOSIS — Z79899 Other long term (current) drug therapy: Secondary | ICD-10-CM

## 2016-11-20 DIAGNOSIS — Z955 Presence of coronary angioplasty implant and graft: Secondary | ICD-10-CM

## 2016-11-20 DIAGNOSIS — I10 Essential (primary) hypertension: Secondary | ICD-10-CM | POA: Diagnosis present

## 2016-11-20 DIAGNOSIS — R262 Difficulty in walking, not elsewhere classified: Secondary | ICD-10-CM

## 2016-11-20 DIAGNOSIS — J9621 Acute and chronic respiratory failure with hypoxia: Secondary | ICD-10-CM | POA: Diagnosis not present

## 2016-11-20 DIAGNOSIS — J189 Pneumonia, unspecified organism: Secondary | ICD-10-CM | POA: Diagnosis not present

## 2016-11-20 DIAGNOSIS — J9622 Acute and chronic respiratory failure with hypercapnia: Secondary | ICD-10-CM | POA: Diagnosis not present

## 2016-11-20 DIAGNOSIS — E11649 Type 2 diabetes mellitus with hypoglycemia without coma: Secondary | ICD-10-CM | POA: Diagnosis present

## 2016-11-20 DIAGNOSIS — M810 Age-related osteoporosis without current pathological fracture: Secondary | ICD-10-CM | POA: Diagnosis present

## 2016-11-20 DIAGNOSIS — I2510 Atherosclerotic heart disease of native coronary artery without angina pectoris: Secondary | ICD-10-CM | POA: Diagnosis present

## 2016-11-20 DIAGNOSIS — I25708 Atherosclerosis of coronary artery bypass graft(s), unspecified, with other forms of angina pectoris: Secondary | ICD-10-CM

## 2016-11-20 DIAGNOSIS — G9341 Metabolic encephalopathy: Secondary | ICD-10-CM | POA: Diagnosis not present

## 2016-11-20 DIAGNOSIS — R0902 Hypoxemia: Secondary | ICD-10-CM | POA: Diagnosis not present

## 2016-11-20 DIAGNOSIS — Z825 Family history of asthma and other chronic lower respiratory diseases: Secondary | ICD-10-CM

## 2016-11-20 DIAGNOSIS — I079 Rheumatic tricuspid valve disease, unspecified: Secondary | ICD-10-CM | POA: Diagnosis present

## 2016-11-20 DIAGNOSIS — Z853 Personal history of malignant neoplasm of breast: Secondary | ICD-10-CM

## 2016-11-20 DIAGNOSIS — N179 Acute kidney failure, unspecified: Secondary | ICD-10-CM | POA: Diagnosis not present

## 2016-11-20 DIAGNOSIS — Z833 Family history of diabetes mellitus: Secondary | ICD-10-CM

## 2016-11-20 DIAGNOSIS — I130 Hypertensive heart and chronic kidney disease with heart failure and stage 1 through stage 4 chronic kidney disease, or unspecified chronic kidney disease: Secondary | ICD-10-CM | POA: Diagnosis present

## 2016-11-20 DIAGNOSIS — Z8249 Family history of ischemic heart disease and other diseases of the circulatory system: Secondary | ICD-10-CM

## 2016-11-20 DIAGNOSIS — Z794 Long term (current) use of insulin: Secondary | ICD-10-CM

## 2016-11-20 DIAGNOSIS — I5032 Chronic diastolic (congestive) heart failure: Secondary | ICD-10-CM | POA: Diagnosis present

## 2016-11-20 DIAGNOSIS — N183 Chronic kidney disease, stage 3 unspecified: Secondary | ICD-10-CM | POA: Diagnosis present

## 2016-11-20 DIAGNOSIS — Z7982 Long term (current) use of aspirin: Secondary | ICD-10-CM

## 2016-11-20 DIAGNOSIS — I5042 Chronic combined systolic (congestive) and diastolic (congestive) heart failure: Secondary | ICD-10-CM | POA: Diagnosis present

## 2016-11-20 DIAGNOSIS — E1151 Type 2 diabetes mellitus with diabetic peripheral angiopathy without gangrene: Secondary | ICD-10-CM | POA: Diagnosis present

## 2016-11-20 DIAGNOSIS — Z933 Colostomy status: Secondary | ICD-10-CM

## 2016-11-20 DIAGNOSIS — Z951 Presence of aortocoronary bypass graft: Secondary | ICD-10-CM

## 2016-11-20 DIAGNOSIS — E1122 Type 2 diabetes mellitus with diabetic chronic kidney disease: Secondary | ICD-10-CM | POA: Diagnosis present

## 2016-11-20 DIAGNOSIS — D509 Iron deficiency anemia, unspecified: Secondary | ICD-10-CM | POA: Diagnosis present

## 2016-11-20 DIAGNOSIS — R0602 Shortness of breath: Secondary | ICD-10-CM | POA: Diagnosis not present

## 2016-11-20 DIAGNOSIS — I480 Paroxysmal atrial fibrillation: Secondary | ICD-10-CM | POA: Diagnosis present

## 2016-11-20 DIAGNOSIS — I4891 Unspecified atrial fibrillation: Secondary | ICD-10-CM | POA: Diagnosis present

## 2016-11-20 DIAGNOSIS — Z9049 Acquired absence of other specified parts of digestive tract: Secondary | ICD-10-CM

## 2016-11-20 LAB — BASIC METABOLIC PANEL
Anion gap: 11 (ref 5–15)
BUN: 46 mg/dL — ABNORMAL HIGH (ref 6–20)
CO2: 29 mmol/L (ref 22–32)
Calcium: 9.1 mg/dL (ref 8.9–10.3)
Chloride: 97 mmol/L — ABNORMAL LOW (ref 101–111)
Creatinine, Ser: 1.51 mg/dL — ABNORMAL HIGH (ref 0.44–1.00)
GFR calc Af Amer: 37 mL/min — ABNORMAL LOW (ref >60)
GFR calc non Af Amer: 32 mL/min — ABNORMAL LOW (ref >60)
Glucose, Bld: 191 mg/dL — ABNORMAL HIGH (ref 65–99)
Potassium: 5.3 mmol/L — ABNORMAL HIGH (ref 3.5–5.1)
Sodium: 137 mmol/L (ref 135–145)

## 2016-11-20 LAB — CBC WITH DIFFERENTIAL/PLATELET
Basophils Absolute: 0 10*3/uL (ref 0.0–0.1)
Basophils Relative: 0 %
Eosinophils Absolute: 0 10*3/uL (ref 0.0–0.7)
Eosinophils Relative: 0 %
HCT: 41.6 % (ref 36.0–46.0)
Hemoglobin: 12.9 g/dL (ref 12.0–15.0)
Lymphocytes Relative: 13 %
Lymphs Abs: 1.3 10*3/uL (ref 0.7–4.0)
MCH: 29.1 pg (ref 26.0–34.0)
MCHC: 31 g/dL (ref 30.0–36.0)
MCV: 93.7 fL (ref 78.0–100.0)
Monocytes Absolute: 2.8 10*3/uL — ABNORMAL HIGH (ref 0.1–1.0)
Monocytes Relative: 29 %
Neutro Abs: 5.6 10*3/uL (ref 1.7–7.7)
Neutrophils Relative %: 58 %
Platelets: 246 10*3/uL (ref 150–400)
RBC: 4.44 MIL/uL (ref 3.87–5.11)
RDW: 16.1 % — ABNORMAL HIGH (ref 11.5–15.5)
WBC: 9.7 10*3/uL (ref 4.0–10.5)

## 2016-11-20 LAB — I-STAT CG4 LACTIC ACID, ED: LACTIC ACID, VENOUS: 1.85 mmol/L (ref 0.5–1.9)

## 2016-11-20 LAB — GLUCOSE, CAPILLARY
GLUCOSE-CAPILLARY: 155 mg/dL — AB (ref 65–99)
GLUCOSE-CAPILLARY: 133 mg/dL — AB (ref 65–99)

## 2016-11-20 MED ORDER — AMLODIPINE BESYLATE 10 MG PO TABS
10.0000 mg | ORAL_TABLET | Freq: Every day | ORAL | Status: DC
  Administered 2016-11-21: 10 mg via ORAL
  Filled 2016-11-20: qty 1

## 2016-11-20 MED ORDER — ONDANSETRON HCL 4 MG/2ML IJ SOLN: 4.0000 mg | Freq: Four times a day (QID) | INTRAMUSCULAR | Status: DC | PRN

## 2016-11-20 MED ORDER — SORBITOL 70 % SOLN
30.0000 mL | Freq: Every day | Status: DC | PRN
  Filled 2016-11-20: qty 30

## 2016-11-20 MED ORDER — ALBUTEROL SULFATE (2.5 MG/3ML) 0.083% IN NEBU: 2.5000 mg | INHALATION_SOLUTION | RESPIRATORY_TRACT | Status: DC | PRN

## 2016-11-20 MED ORDER — SOTALOL HCL 80 MG PO TABS
40.0000 mg | ORAL_TABLET | Freq: Two times a day (BID) | ORAL | Status: DC
  Administered 2016-11-20 – 2016-11-25 (×10): 40 mg via ORAL
  Filled 2016-11-20: qty 0.5
  Filled 2016-11-20: qty 1
  Filled 2016-11-20 (×3): qty 0.5
  Filled 2016-11-20: qty 1
  Filled 2016-11-20 (×5): qty 0.5

## 2016-11-20 MED ORDER — ASPIRIN EC 81 MG PO TBEC
81.0000 mg | DELAYED_RELEASE_TABLET | Freq: Every day | ORAL | Status: DC
Start: 2016-11-21 — End: 2016-11-25
  Administered 2016-11-21 – 2016-11-25 (×5): 81 mg via ORAL
  Filled 2016-11-20 (×5): qty 1

## 2016-11-20 MED ORDER — DEXTROSE 5 % IV SOLN
500.0000 mg | Freq: Once | INTRAVENOUS | Status: DC
  Filled 2016-11-20: qty 500

## 2016-11-20 MED ORDER — NITROGLYCERIN 0.4 MG SL SUBL: 0.4000 mg | SUBLINGUAL_TABLET | SUBLINGUAL | Status: DC | PRN

## 2016-11-20 MED ORDER — VITAMIN D 1000 UNITS PO TABS
1000.0000 [IU] | ORAL_TABLET | Freq: Two times a day (BID) | ORAL | Status: DC
  Administered 2016-11-20 – 2016-11-25 (×10): 1000 [IU] via ORAL
  Filled 2016-11-20 (×11): qty 1

## 2016-11-20 MED ORDER — ALBUTEROL SULFATE (2.5 MG/3ML) 0.083% IN NEBU
2.5000 mg | INHALATION_SOLUTION | Freq: Three times a day (TID) | RESPIRATORY_TRACT | Status: DC
  Administered 2016-11-21: 2.5 mg via RESPIRATORY_TRACT
  Filled 2016-11-20: qty 3

## 2016-11-20 MED ORDER — ONDANSETRON HCL 4 MG PO TABS: 4.0000 mg | ORAL_TABLET | Freq: Four times a day (QID) | ORAL | Status: DC | PRN

## 2016-11-20 MED ORDER — HYDROCODONE-ACETAMINOPHEN 5-325 MG PO TABS
1.0000 | ORAL_TABLET | ORAL | Status: DC | PRN
Start: 2016-11-20 — End: 2016-11-21

## 2016-11-20 MED ORDER — INSULIN ASPART 100 UNIT/ML ~~LOC~~ SOLN
0.0000 [IU] | Freq: Three times a day (TID) | SUBCUTANEOUS | Status: DC
Start: 2016-11-20 — End: 2016-11-21
  Administered 2016-11-20: 3 [IU] via SUBCUTANEOUS
  Administered 2016-11-21 (×2): 2 [IU] via SUBCUTANEOUS

## 2016-11-20 MED ORDER — DEXTROSE 5 % IV SOLN
1.0000 g | Freq: Once | INTRAVENOUS | Status: DC
  Filled 2016-11-20: qty 10

## 2016-11-20 MED ORDER — ALBUTEROL SULFATE (2.5 MG/3ML) 0.083% IN NEBU: 2.5000 mg | INHALATION_SOLUTION | Freq: Four times a day (QID) | RESPIRATORY_TRACT | Status: DC

## 2016-11-20 MED ORDER — DEXTROSE 5 % IV SOLN
1.0000 g | INTRAVENOUS | Status: DC
  Administered 2016-11-20 – 2016-11-24 (×5): 1 g via INTRAVENOUS
  Filled 2016-11-20 (×6): qty 10

## 2016-11-20 MED ORDER — RANOLAZINE ER 500 MG PO TB12
500.0000 mg | ORAL_TABLET | Freq: Two times a day (BID) | ORAL | Status: DC
Start: 2016-11-20 — End: 2016-11-21
  Administered 2016-11-20 – 2016-11-21 (×2): 500 mg via ORAL
  Filled 2016-11-20 (×4): qty 1

## 2016-11-20 MED ORDER — ALBUTEROL SULFATE (2.5 MG/3ML) 0.083% IN NEBU: 5.0000 mg | INHALATION_SOLUTION | Freq: Once | RESPIRATORY_TRACT | Status: DC

## 2016-11-20 MED ORDER — ALBUTEROL SULFATE (2.5 MG/3ML) 0.083% IN NEBU
2.5000 mg | INHALATION_SOLUTION | Freq: Four times a day (QID) | RESPIRATORY_TRACT | Status: DC
  Administered 2016-11-20: 2.5 mg via RESPIRATORY_TRACT
  Filled 2016-11-20: qty 3

## 2016-11-20 MED ORDER — ATORVASTATIN CALCIUM 20 MG PO TABS
20.0000 mg | ORAL_TABLET | Freq: Every day | ORAL | Status: DC
  Administered 2016-11-20 – 2016-11-24 (×5): 20 mg via ORAL
  Filled 2016-11-20 (×7): qty 1

## 2016-11-20 MED ORDER — ACETAMINOPHEN 650 MG RE SUPP: 650.0000 mg | Freq: Four times a day (QID) | RECTAL | Status: DC | PRN

## 2016-11-20 MED ORDER — AZITHROMYCIN 500 MG PO TABS
500.0000 mg | ORAL_TABLET | ORAL | Status: DC
  Administered 2016-11-20 – 2016-11-24 (×4): 500 mg via ORAL
  Filled 2016-11-20 (×2): qty 2
  Filled 2016-11-20 (×3): qty 1

## 2016-11-20 MED ORDER — ENOXAPARIN SODIUM 30 MG/0.3ML ~~LOC~~ SOLN
30.0000 mg | SUBCUTANEOUS | Status: DC
  Administered 2016-11-20 – 2016-11-23 (×4): 30 mg via SUBCUTANEOUS
  Filled 2016-11-20 (×6): qty 0.3

## 2016-11-20 MED ORDER — GUAIFENESIN ER 600 MG PO TB12
600.0000 mg | ORAL_TABLET | Freq: Two times a day (BID) | ORAL | Status: DC
  Administered 2016-11-20 – 2016-11-25 (×10): 600 mg via ORAL
  Filled 2016-11-20 (×11): qty 1

## 2016-11-20 MED ORDER — SODIUM CHLORIDE 0.9 % IV SOLN
INTRAVENOUS | Status: AC
  Administered 2016-11-20: 18:00:00 via INTRAVENOUS

## 2016-11-20 MED ORDER — ACETAMINOPHEN 325 MG PO TABS: 650.0000 mg | ORAL_TABLET | Freq: Four times a day (QID) | ORAL | Status: DC | PRN

## 2016-11-20 MED ORDER — MECLIZINE HCL 25 MG PO TABS
25.0000 mg | ORAL_TABLET | Freq: Every day | ORAL | Status: DC | PRN
  Filled 2016-11-20: qty 1

## 2016-11-20 MED ORDER — INSULIN ASPART 100 UNIT/ML ~~LOC~~ SOLN: 0.0000 [IU] | Freq: Every day | SUBCUTANEOUS | Status: DC

## 2016-11-20 MED ORDER — SODIUM CHLORIDE 0.9 % IV BOLUS (SEPSIS)
1000.0000 mL | Freq: Once | INTRAVENOUS | Status: AC
  Administered 2016-11-20: 1000 mL via INTRAVENOUS

## 2016-11-20 NOTE — Progress Notes (Signed)
She has had more cough and SOB over the last few weeks.    Initially on exam pulse ox 64% (!), up to 91-94% within about 1 minute on 3L 02.  Recheck pulse ox on mult meters were similar.    She has noted some jerking movements in the hands in the last 10 days.    No fevers, no chills.  No sputum now, but prev had some sputum in the last few weeks.    PMH and SH reviewed  ROS: Per HPI unless specifically indicated in ROS section   Meds, vitals, and allergies reviewed.   No inc in wob but initially has cyanotic changes on the skin, improved with 3L 02.  ncat Neck supple  rrr Global dec in BS but no focal dec in BS, no inc wob abd soft Ext with 1+ BLE edema

## 2016-11-20 NOTE — Assessment & Plan Note (Signed)
Initially on exam pulse ox 64% (!), up to 91-94% within about 1 minute on 3L 02.  Recheck pulse ox on mult meters were similar.   I tried brief period off supplemental O2 and she had quick desat down to 80s and I restarted O2 with resolution.    Profoundly low pulse ox initially, needs ER eval with EMS transport.  This may be multifactorial, d/w pt.    >25 minutes spent in face to face time with patient, >50% spent in counselling or coordination of care.

## 2016-11-20 NOTE — ED Provider Notes (Signed)
[facility] DEPT Provider Note   CSN: [id number] Arrival date & time: 11/20/16  1231     History   Chief Complaint Chief Complaint  Patient presents with  . Shortness of Breath    HPI [name] is a 82 y.o. female.  HPI   82 year old female presents today with complaints of shortness of breath.  Patient notes in October she suffered a fall with numerous left-sided rib fractures.  She notes since that time the rib pain has improved and has very minimal discomfort to the ribs.  Patient notes over the last several weeks she has had worsening shortness of breath, worse with ambulation.  She notes she is unable to ambulate without significant dyspnea.  She is also had a productive cough.  She notes a fever approximately 4 weeks ago, none presently.  Patient notes over the last week she has had minor increase in her lower extremity swelling, bilateral.  Patient went to her primary care provider's office today was noted to have a pulse ox of 64% which improved to the 90s on 3 L of nasal cannula.  Patient was brought via EMS who noted oxygen in the 80s.  Upon my presentation patient notes that she feels slightly short of breath at rest, but more severe with ambulation.  She denies any significant chest pain.   Past Medical History:  Diagnosis Date  . Anemia, iron deficiency   . Atrial fibrillation ([place]) 11/28/2010   2D Echo - EF-30-35, left atrium moderate to severely dilated, right ventricle moderately dilated, tricuspid valve mild-moderate regurgitation  . Cancer [facility])    R beast cancer followed by Dr. Hassell Done  . Chronic kidney disease (CKD), stage II (mild)   . Coronary artery disease   . Diabetes mellitus, type 2 ([place])   . Diverticulitis   . History of breast cancer    Followed yearly by [facility]  . Hyperlipemia   . Hypertension   . Nontraumatic retroperitoneal hematoma   . Osteoporosis   . Peripheral vascular disease [facility])     Patient Active Problem List   Diagnosis Date Noted   . Hypoxia 11/20/2016  . Acute respiratory failure with hypoxia ([place]) 11/20/2016  . CAP (community acquired pneumonia) 11/20/2016  . Loss of weight 09/16/2016  . Skin tear of hand without complication [id number]  . Chest wall pain 07/19/2016  . Cat scratch 06/17/2016  . Neck mass 03/16/2016  . Chronic combined systolic and diastolic congestive heart failure ([place]) 11/11/2015  . Neck pain 01/09/2015  . Medicare annual wellness visit, initial 01/04/2015  . Advance care planning 01/04/2015  . Skin lesion 08/24/2013  . Risk for falls 08/24/2013  . Cardiomyopathy, ischemic 08/24/2013  . Left tibial fracture 05/05/2013  . Coronary artery disease   . Hyperlipemia   . Constipation 10/10/2012  . Retroperitoneal hematoma 10/09/2012  . Abdominal pain, acute 10/09/2012  . Chronic kidney disease, stage III (moderate) 08/12/2012  . Colostomy in place [facility]) 08/12/2012  . Vitamin D deficiency 11/28/2008  . CAROTID BRUIT 05/24/2008  . Iron deficiency anemia 04/20/2007  . Type 2 diabetes mellitus with vascular disease ([place]) 04/19/2007  . HLD (hyperlipidemia) 04/19/2007  . Essential hypertension 04/19/2007  . Coronary atherosclerosis 04/19/2007  . Atrial fibrillation ([place]) 04/19/2007  . Peripheral vascular disease ([place]) 04/19/2007  . OSTEOPOROSIS 04/19/2007    Past Surgical History:  Procedure Laterality Date  . APPENDECTOMY  1962  . BREAST SURGERY  02/01   Quadrectomy Breast cancer (Dr. Hassell Done)  . CARDIAC CATHETERIZATION  2000   Iliac Stent (Dr. Juanita Craver) yearly follow-up  . CARDIAC CATHETERIZATION  01/25/02   High grade LAD disease, total of 3 vess, involvement  . CARDIAC CATHETERIZATION  01/13/07   SV graft to diag. occluded but good backfill o/w ok  . CARDIAC CATHETERIZATION  02/14/10   PTCA & stent vein graft to OM/RCA ([place])  . CARDIAC CATHETERIZATION  04/17/2011   Saphenous vein grafts to the RCA 100% occluded, to the OM 100% occluded, and the diagonal 100% occluded. Internal  mammary artery to the LAD, widely patent.  Marland Kitchen CATARACT EXTRACTION  10/10 and 02/11  . COLOSTOMY    . CORONARY ARTERY BYPASS GRAFT  02/09/02   X 4 ([place])  . DILATION AND CURETTAGE OF UTERUS  1971    miscarrage, abnormal bleeding  . DILATION AND CURETTAGE OF UTERUS  1995   Menorrhagia  . HEMORRHOID SURGERY  09/11/04   Binding Hassell Done)  . PARTIAL COLECTOMY    . SHOULDER ARTHROSCOPY  05/04   Right, with rotator cuff debridement    OB History    No data available       Home Medications    Prior to Admission medications   Medication Sig Start Date End Date Taking? Authorizing Provider  amLODipine (NORVASC) 10 MG tablet Take 10 mg by mouth daily.   Yes Historical Provider, MD  aspirin EC 81 MG tablet Take 81 mg by mouth daily.   Yes Historical Provider, MD  atorvastatin (LIPITOR) 20 MG tablet TAKE 1 TABLET BY MOUTH AT BEDTIME 06/24/16  Yes Mihai Croitoru, MD  benazepril (LOTENSIN) 20 MG tablet Take 1 tablet (20 mg total) by mouth daily. 11/13/16  Yes Mihai Croitoru, MD  Cholecalciferol (VITAMIN D) 1000 UNITS capsule Take 1,000 Units by mouth 2 (two) times daily.     Yes Historical Provider, MD  glyBURIDE (DIABETA) 5 MG tablet TAKE 2 TABLETS (10 MG TOTAL) BY MOUTH DAILY WITH BREAKFAST. 07/03/16  Yes Tonia Ghent, MD  insulin aspart (NOVOLOG) 100 UNIT/ML injection USE PER SLIDING SCALE--200-250=7 UNITS, 251-300=8 UNITS,301-350=9 UNITS, WITH MEALS 03/16/16  Yes Tonia Ghent, MD  Insulin Syringe-Needle U-100 (BD INSULIN SYRINGE ULTRAFINE) 31G X 5/16" 0.3 ML MISC USE DAILY AND AS DIRECTED SLIDING SCALE.  Multiple injections of insulin per day 12/12/15  Yes Tonia Ghent, MD  meclizine (ANTIVERT) 25 MG tablet Take 1 tablet (25 mg total) by mouth daily as needed. For dizziness 01/03/15  Yes Tonia Ghent, MD  nitroGLYCERIN (NITROSTAT) 0.4 MG SL tablet Place 1 tablet (0.4 mg total) under the tongue as needed for chest pain. As directed 12/12/15  Yes Tonia Ghent, MD  nystatin  (MYCOSTATIN/NYSTOP) 100000 UNIT/GM POWD Apply up to 3 times a day if needed. 12/12/15  Yes Tonia Ghent, MD  Polyvinyl Alcohol-Povidone (REFRESH OP) Place 1 drop into both eyes daily as needed.   Yes Historical Provider, MD  RANEXA 500 MG 12 hr tablet TAKE 1 TABLET (500 MG TOTAL) BY MOUTH 2 (TWO) TIMES DAILY. NEED APPOINTMENT FOR MORE REFILLS 06/05/16  Yes Mihai Croitoru, MD  SOTALOL AF 80 MG TABS Take 0.5 tablets (40 mg total) by mouth 2 (two) times daily. 03/12/16  Yes Mihai Croitoru, MD  ONE TOUCH ULTRA TEST test strip USE TO CHECK BLOOD SUGAR 4 TIMES A DAY. INSULIN-DEPENDENT. DIAGNOSIS: E11.9 06/15/16   Tonia Ghent, MD    Family History Family History  Problem Relation Age of Onset  . Hypertension Mother   . Diabetes Mother   .  Heart disease Mother   . Hypertension Father   . Heart disease Father 46    Heart attack  . Heart disease Brother     CHF  . Hypertension Brother   . COPD Brother     bronchial, frequent pneumonia  . Hypertension Brother   . Hyperlipidemia Brother   . Heart disease Brother     CABG  . Hypertension Brother   . Gout Brother   . Obesity Brother   . Diabetes Son   . Hypertension Son     Social History Social History  Substance Use Topics  . Smoking status: Never Smoker  . Smokeless tobacco: Never Used  . Alcohol use No     Allergies   Actos [pioglitazone]; Metformin and related; and Zocor [simvastatin]   Review of Systems Review of Systems  All other systems reviewed and are negative.    Physical Exam Updated Vital Signs BP 115/69 (BP Location: Right Arm)   Pulse 66   Temp 98.3 F (36.8 C) (Oral)   Resp 19   SpO2 96%   Physical Exam  Constitutional: She is oriented to person, place, and time. She appears well-developed and well-nourished.  HENT:  Head: Normocephalic and atraumatic.  Eyes: Conjunctivae are normal. Pupils are equal, round, and reactive to light. Right eye exhibits no discharge. Left eye exhibits no discharge. No  scleral icterus.  Neck: Normal range of motion. No JVD present. No tracheal deviation present.  Cardiovascular: Normal rate and regular rhythm.   Pulmonary/Chest: Effort normal and breath sounds normal. No stridor. No respiratory distress. She has no wheezes. She has no rales. She exhibits no tenderness.  Small crackles left lower lobe  Abdominal: Soft.  Neurological: She is alert and oriented to person, place, and time. Coordination normal.  Skin: Skin is warm.  Psychiatric: She has a normal mood and affect. Her behavior is normal. Judgment and thought content normal.  Nursing note and vitals reviewed.    ED Treatments / Results  Labs (all labs ordered are listed, but only abnormal results are displayed) Labs Reviewed  CBC WITH DIFFERENTIAL/PLATELET - Abnormal; Notable for the following:       Result Value   RDW 16.1 (*)    Monocytes Absolute 2.8 (*)    All other components within normal limits  BASIC METABOLIC PANEL - Abnormal; Notable for the following:    Potassium 5.3 (*)    Chloride 97 (*)    Glucose, Bld 191 (*)    BUN 46 (*)    Creatinine, Ser 1.51 (*)    GFR calc non Af Amer 32 (*)    GFR calc Af Amer 37 (*)    All other components within normal limits  CULTURE, BLOOD (ROUTINE X 2)  CULTURE, BLOOD (ROUTINE X 2)  CULTURE, EXPECTORATED SPUTUM-ASSESSMENT  GRAM STAIN  STREP PNEUMONIAE URINARY ANTIGEN  HEMOGLOBIN A1C  I-STAT CG4 LACTIC ACID, ED  I-STAT CG4 LACTIC ACID, ED    EKG  EKG Interpretation  Date/Time:  Friday November 20 2016 12:46:05 EST Ventricular Rate:  71 PR Interval:    QRS Duration: 70 QT Interval:  509 QTC Calculation: 554 R Axis:   -6 Text Interpretation:  Sinus rhythm Consider anterior infarct Borderline T abnormalities, inferior leads Prolonged QT interval T wave flattening similar to Jan 2016 Confirmed by Regenia Skeeter MD, [place] [phone]) on 11/20/2016 1:10:27 PM       Radiology Dg Chest 2 View  Result Date: 11/20/2016 CLINICAL DATA:   Shortness of breath,  cough EXAM: CHEST  2 VIEW COMPARISON:  07/14/2016 FINDINGS: Cardiomegaly again noted. Status post CABG. No convincing pulmonary edema. There is hazy airspace disease in left lower lobe retrocardiac best seen on lateral view highly suspicious for pneumonia. Osteopenia and degenerative changes thoracic spine. Old fractures of the left fourth fifth and sixth ribs. IMPRESSION: Cardiomegaly. Status post CABG. Hazy infiltrate/pneumonia in left lower lobe retrocardiac best seen on lateral view. No convincing pulmonary edema. Old fractures of the left fourth fifth and sixth rib. Electronically Signed   By: Lahoma Crocker M.D.   On: 11/20/2016 13:25    Procedures Procedures (including critical care time)  Medications Ordered in ED Medications  sodium chloride 0.9 % bolus 1,000 mL (not administered)  amLODipine (NORVASC) tablet 10 mg (not administered)  atorvastatin (LIPITOR) tablet 20 mg (not administered)  ranolazine (RANEXA) 12 hr tablet 500 mg (not administered)  SOTALOL AF TABS 40 mg (not administered)  nitroGLYCERIN (NITROSTAT) SL tablet 0.4 mg (not administered)  meclizine (ANTIVERT) tablet 25 mg (not administered)  aspirin EC tablet 81 mg (not administered)  cholecalciferol (VITAMIN D) tablet 1,000 Units (not administered)  cefTRIAXone (ROCEPHIN) 1 g in dextrose 5 % 50 mL IVPB (not administered)  azithromycin (ZITHROMAX) tablet 500 mg (not administered)  insulin aspart (novoLOG) injection 0-15 Units (not administered)  insulin aspart (novoLOG) injection 0-5 Units (not administered)  enoxaparin (LOVENOX) injection 30 mg (not administered)  0.9 %  sodium chloride infusion (not administered)  acetaminophen (TYLENOL) tablet 650 mg (not administered)    Or  acetaminophen (TYLENOL) suppository 650 mg (not administered)  HYDROcodone-acetaminophen (NORCO/VICODIN) 5-325 MG per tablet 1-2 tablet (not administered)  ondansetron (ZOFRAN) tablet 4 mg (not administered)    Or    ondansetron (ZOFRAN) injection 4 mg (not administered)  sorbitol 70 % solution 30 mL (not administered)  albuterol (PROVENTIL) (2.5 MG/3ML) 0.083% nebulizer solution 2.5 mg (not administered)  albuterol (PROVENTIL) (2.5 MG/3ML) 0.083% nebulizer solution 2.5 mg (not administered)  guaiFENesin (MUCINEX) 12 hr tablet 600 mg (not administered)     Initial Impression / Assessment and Plan / ED Course  I have reviewed the triage vital signs and the nursing notes.  Pertinent labs & imaging results that were available during my care of the patient were reviewed by me and considered in my medical decision making (see chart for details).      Final Clinical Impressions(s) / ED Diagnoses   Final diagnoses:  Community acquired pneumonia of left lung, unspecified part of lung    Labs: I-STAT lactic acid, blood culture, CBC, BMP  Imaging: CT chest 2 view  Consults:  Therapeutics: Ceftriaxone, azithromycin  Discharge Meds:   Assessment/Plan:   82 year old female presents today with shortness of breath.  Patient has findings consistent with pneumonia on chest x-ray.  She is afebrile and nontoxic.  She is noted to have severe dyspnea with exertion, in the 80s on room air here.  Patient is on 3 L nasal cannula with no signs of discomfort.  Patient will be given community-acquired pneumonia antibiotics, and admitted to the hospital for ongoing management.    New Prescriptions New Prescriptions   No medications on file     Okey Regal, PA-C 11/20/16 [place], MD 11/26/16 [phone]

## 2016-11-20 NOTE — ED Notes (Signed)
Patient transported to X-ray 

## 2016-11-20 NOTE — Patient Instructions (Signed)
To ER

## 2016-11-20 NOTE — ED Triage Notes (Signed)
Pt. From MD office. She had a fall back in Oct and sustained rib fractures. Since then, she has had mild SOB, and mild rib pain. Has gotten increasingly worse the past week and has been increasing dyspnea with exertion and dizziness. RA sats with EMS around 80%. Hy. Of CHF with some 1+ pitting edema in legs and feet that she states has gotten worse recently

## 2016-11-20 NOTE — H&P (Signed)
History and Physical    [name] [id number] DOB: 12/15/1934 DOA: 11/20/2016  PCP: [name], MD Patient coming from: home  Chief Complaint: sob  HPI: [name] is a pleasant 82 y.o. female with medical history significant for CAD, chronic kidney disease, A. fib my hypertension, diabetes presents to emergency Department chief complaint persistent worsening shortness of breath and cough. Initial evaluation reveals acute respiratory failure with hypoxia likely related to community-acquired pneumonia.  Formation is obtained from the patient and her daughter who is at the bedside. She states several weeks ago she fell and suffered some fractured ribs. Initially the pain was "excruciating" and compromised her respiratory status but she states "that got better" however over the last several weeks she developed a cough and shortness of breath. She attempted to manage with over-the-counter antitussives and Tylenol with no improvement. Today she went to see her primary care provider and on initial exam oxygen saturation level reported to be 64% on room air. Patient denies fever chills nausea vomiting diarrhea. She denies headache syncope or near-syncope. She denies chest pain palpitations lower extremity edema or orthopnea. She denies dysuria hematuria frequency or urgency. She has a colostomy bag in her stools normal in color and consistency.  ED Course: In the emergency department she's afebrile hemodynamically stable with an oxygen saturation level 87% on room air. She is provided with oxygen at the time of admission her oxygen saturation levels 94% on 4 L nasal cannula.  Review of Systems: As per HPI otherwise 10 point review of systems negative.   Ambulatory Status: Ambulates independently with a fairly steady gait  Past Medical History:  Diagnosis Date  . Anemia, iron deficiency   . Atrial fibrillation ([place]) 11/28/2010   2D Echo - EF-30-35, left atrium moderate to severely dilated,  right ventricle moderately dilated, tricuspid valve mild-moderate regurgitation  . Cancer [facility])    R beast cancer followed by Dr. Hassell Done  . Chronic kidney disease (CKD), stage II (mild)   . Coronary artery disease   . Diabetes mellitus, type 2 ([place])   . Diverticulitis   . History of breast cancer    Followed yearly by [facility]  . Hyperlipemia   . Hypertension   . Nontraumatic retroperitoneal hematoma   . Osteoporosis   . Peripheral vascular disease [facility])     Past Surgical History:  Procedure Laterality Date  . APPENDECTOMY  1962  . BREAST SURGERY  02/01   Quadrectomy Breast cancer (Dr. Hassell Done)  . CARDIAC CATHETERIZATION  2000   Iliac Stent (Dr. Juanita Craver) yearly follow-up  . CARDIAC CATHETERIZATION  01/25/02   High grade LAD disease, total of 3 vess, involvement  . CARDIAC CATHETERIZATION  01/13/07   SV graft to diag. occluded but good backfill o/w ok  . CARDIAC CATHETERIZATION  02/14/10   PTCA & stent vein graft to OM/RCA ([place])  . CARDIAC CATHETERIZATION  04/17/2011   Saphenous vein grafts to the RCA 100% occluded, to the OM 100% occluded, and the diagonal 100% occluded. Internal mammary artery to the LAD, widely patent.  Marland Kitchen CATARACT EXTRACTION  10/10 and 02/11  . COLOSTOMY    . CORONARY ARTERY BYPASS GRAFT  02/09/02   X 4 ([place])  . DILATION AND CURETTAGE OF UTERUS  1971    miscarrage, abnormal bleeding  . DILATION AND CURETTAGE OF UTERUS  1995   Menorrhagia  . HEMORRHOID SURGERY  09/11/04   Binding Hassell Done)  . PARTIAL COLECTOMY    . SHOULDER ARTHROSCOPY  05/04  Right, with rotator cuff debridement    Social History   Social History  . Marital status: Widowed    Spouse name: N/A  . Number of children: 2  . Years of education: N/A   Occupational History  . Retired    Social History Main Topics  . Smoking status: Never Smoker  . Smokeless tobacco: Never Used  . Alcohol use No  . Drug use: No  . Sexual activity: No   Other Topics Concern  . Not on file    Social History Narrative   Retired 1991 from [employer] work   Widowed as of 04/2011 after 50+ years   Lives alone   2 kids in [place]   Enjoys travelling and reading    Allergies  Allergen Reactions  . Actos [Pioglitazone] Other (See Comments)    edema  . Metformin And Related Other (See Comments)    Held due to renal function  . Zocor [Simvastatin] Other (See Comments)    Myalgias    Family History  Problem Relation Age of Onset  . Hypertension Mother   . Diabetes Mother   . Heart disease Mother   . Hypertension Father   . Heart disease Father 75    Heart attack  . Heart disease Brother     CHF  . Hypertension Brother   . COPD Brother     bronchial, frequent pneumonia  . Hypertension Brother   . Hyperlipidemia Brother   . Heart disease Brother     CABG  . Hypertension Brother   . Gout Brother   . Obesity Brother   . Diabetes Son   . Hypertension Son     Prior to Admission medications   Medication Sig Start Date End Date Taking? Authorizing Provider  amLODipine (NORVASC) 10 MG tablet Take 10 mg by mouth daily.   Yes Historical Provider, MD  aspirin EC 81 MG tablet Take 81 mg by mouth daily.   Yes Historical Provider, MD  atorvastatin (LIPITOR) 20 MG tablet TAKE 1 TABLET BY MOUTH AT BEDTIME 06/24/16  Yes Mihai Croitoru, MD  benazepril (LOTENSIN) 20 MG tablet Take 1 tablet (20 mg total) by mouth daily. 11/13/16  Yes Mihai Croitoru, MD  Cholecalciferol (VITAMIN D) 1000 UNITS capsule Take 1,000 Units by mouth 2 (two) times daily.     Yes Historical Provider, MD  glyBURIDE (DIABETA) 5 MG tablet TAKE 2 TABLETS (10 MG TOTAL) BY MOUTH DAILY WITH BREAKFAST. 07/03/16  Yes Tonia Ghent, MD  insulin aspart (NOVOLOG) 100 UNIT/ML injection USE PER SLIDING SCALE--200-250=7 UNITS, 251-300=8 UNITS,301-350=9 UNITS, WITH MEALS 03/16/16  Yes Tonia Ghent, MD  Insulin Syringe-Needle U-100 (BD INSULIN SYRINGE ULTRAFINE) 31G X 5/16" 0.3 ML MISC USE DAILY AND AS DIRECTED  SLIDING SCALE.  Multiple injections of insulin per day 12/12/15  Yes Tonia Ghent, MD  meclizine (ANTIVERT) 25 MG tablet Take 1 tablet (25 mg total) by mouth daily as needed. For dizziness 01/03/15  Yes Tonia Ghent, MD  nitroGLYCERIN (NITROSTAT) 0.4 MG SL tablet Place 1 tablet (0.4 mg total) under the tongue as needed for chest pain. As directed 12/12/15  Yes Tonia Ghent, MD  nystatin (MYCOSTATIN/NYSTOP) 100000 UNIT/GM POWD Apply up to 3 times a day if needed. 12/12/15  Yes Tonia Ghent, MD  Polyvinyl Alcohol-Povidone (REFRESH OP) Place 1 drop into both eyes daily as needed.   Yes Historical Provider, MD  RANEXA 500 MG 12 hr tablet TAKE 1 TABLET (500 MG TOTAL) BY  MOUTH 2 (TWO) TIMES DAILY. NEED APPOINTMENT FOR MORE REFILLS 06/05/16  Yes Mihai Croitoru, MD  SOTALOL AF 80 MG TABS Take 0.5 tablets (40 mg total) by mouth 2 (two) times daily. 03/12/16  Yes Mihai Croitoru, MD  ONE TOUCH ULTRA TEST test strip USE TO CHECK BLOOD SUGAR 4 TIMES A DAY. INSULIN-DEPENDENT. DIAGNOSIS: E11.9 06/15/16   Tonia Ghent, MD    Physical Exam: Vitals:   11/20/16 1247  BP: 115/69  Pulse: 66  Resp: 19  Temp: 98.3 F (36.8 C)  TempSrc: Oral  SpO2: 96%     General:  Appears calm and comfortable Acute distress somewhat frail appearing Eyes:  PERRL, EOMI, normal lids, iris ENT:  grossly normal hearing, lips & tongue, his membranes of her mouth are pink slightly dry Neck:  no LAD, masses or thyromegaly Cardiovascular:  RRR, no m/r/g. No LE edema.  Respiratory:  Normal respiratory effort. Respirations slightly shallow breath sounds slightly diminished particularly in the bases. Fine crackle on the left base. I hear no wheeze no rhonchi Abdomen:  soft, ntnd, NABS Skin:  no rash or induration seen on limited exam Musculoskeletal:  grossly normal tone BUE/BLE, good ROM, no bony abnormality is without swelling/erythema Psychiatric:  grossly normal mood and affect, speech fluent and appropriate, AOx3 Neurologic:   CN 2-12 grossly intact, moves all extremities in coordinated fashion, sensation intact clear facial symmetry  Labs on Admission: I have personally reviewed following labs and imaging studies  CBC:  Recent Labs Lab 11/20/16 1358  WBC 9.7  NEUTROABS 5.6  HGB 12.9  HCT 41.6  MCV 93.7  PLT 0000000   Basic Metabolic Panel:  Recent Labs Lab 11/20/16 1358  NA 137  K 5.3*  CL 97*  CO2 29  GLUCOSE 191*  BUN 46*  CREATININE 1.51*  CALCIUM 9.1   GFR: Estimated Creatinine Clearance: 26.1 mL/min (by C-G formula based on SCr of 1.51 mg/dL (H)). Liver Function Tests: No results for input(s): AST, ALT, ALKPHOS, BILITOT, PROT, ALBUMIN in the last 168 hours. No results for input(s): LIPASE, AMYLASE in the last 168 hours. No results for input(s): AMMONIA in the last 168 hours. Coagulation Profile: No results for input(s): INR, PROTIME in the last 168 hours. Cardiac Enzymes: No results for input(s): CKTOTAL, CKMB, CKMBINDEX, TROPONINI in the last 168 hours. BNP (last 3 results) No results for input(s): PROBNP in the last 8760 hours. HbA1C: No results for input(s): HGBA1C in the last 72 hours. CBG: No results for input(s): GLUCAP in the last 168 hours. Lipid Profile: No results for input(s): CHOL, HDL, LDLCALC, TRIG, CHOLHDL, LDLDIRECT in the last 72 hours. Thyroid Function Tests: No results for input(s): TSH, T4TOTAL, FREET4, T3FREE, THYROIDAB in the last 72 hours. Anemia Panel: No results for input(s): VITAMINB12, FOLATE, FERRITIN, TIBC, IRON, RETICCTPCT in the last 72 hours. Urine analysis:    Component Value Date/Time   COLORURINE YELLOW 10/25/2014 [place] 10/25/2014 1754   LABSPEC 1.018 10/25/2014 1754   PHURINE 7.0 10/25/2014 1754   GLUCOSEU NEGATIVE 10/25/2014 1754   HGBUR NEGATIVE 10/25/2014 1754   HGBUR small 11/27/2010 1056   BILIRUBINUR NEGATIVE 10/25/2014 1754   KETONESUR NEGATIVE 10/25/2014 1754   PROTEINUR NEGATIVE 10/25/2014 1754   UROBILINOGEN  1.0 10/25/2014 1754   NITRITE NEGATIVE 10/25/2014 1754   LEUKOCYTESUR SMALL (A) 10/25/2014 1754    Creatinine Clearance: Estimated Creatinine Clearance: 26.1 mL/min (by C-G formula based on SCr of 1.51 mg/dL (H)).  Sepsis Labs: @LABRCNTIP (procalcitonin:4,lacticidven:4) )No results found  for this or any previous visit (from the past 240 hour(s)).   Radiological Exams on Admission: Dg Chest 2 View  Result Date: 11/20/2016 CLINICAL DATA:  Shortness of breath, cough EXAM: CHEST  2 VIEW COMPARISON:  07/14/2016 FINDINGS: Cardiomegaly again noted. Status post CABG. No convincing pulmonary edema. There is hazy airspace disease in left lower lobe retrocardiac best seen on lateral view highly suspicious for pneumonia. Osteopenia and degenerative changes thoracic spine. Old fractures of the left fourth fifth and sixth ribs. IMPRESSION: Cardiomegaly. Status post CABG. Hazy infiltrate/pneumonia in left lower lobe retrocardiac best seen on lateral view. No convincing pulmonary edema. Old fractures of the left fourth fifth and sixth rib. Electronically Signed   By: Lahoma Crocker M.D.   On: 11/20/2016 13:25    EKG: Independently reviewed. Sinus rhythm Consider anterior infarct Borderline T abnormalities, inferior leads Prolonged QT interval  Assessment/Plan Principal Problem:   Acute respiratory failure with hypoxia (HCC) Active Problems:   Iron deficiency anemia   Essential hypertension   Atrial fibrillation (HCC)   Chronic kidney disease, stage III (moderate)   Coronary artery disease   Chronic combined systolic and diastolic congestive heart failure ([place])   CAP (community acquired pneumonia)   1. Acute respiratory failure with hypoxia. Likely related to routine acquired pneumonia. Oxygen saturation level 87% on room air in the emergency department. Chest x-ray in system with pneumonia left lower lobe. EKG without acute changes -Admit -Continue oxygen  supplementation -Nebulizers -Antitussives -Wean oxygen as able -Antibiotics per protocol  #2. Community-acquired pneumonia. Chest x-ray as noted above. He is afebrile, leukocytosis lactic acid within the limits of normal. Hemodynamically stable and nontoxic appearing -Antibiotics per protocol -Follow blood cultures -Obtain sputum cultures as able -strep pneumo urine antigen -incentive spirometry -mobilize  #3. A. fib. EKG with sinus rhythm. No anticoagulation due to fall risk.chadvasc score 4. Home meds include sotalol -continue sptalol -monitor  #4. Hypertension. Fair control in the emergency department. I medications include amlodipine, and benazopril,sotalol -hold benazopril -continue norvasc -continue sotalol  5. CAD. Denies chest pain. EKG as noted above. -Continue aspirin -Continue statin  6. Diabetes. Glucose 191 on admission. Home medications include insulin and oral agents -We'll hold home meds for now -Obtain hemoglobin A1c -Riding scale insulin for optimal control  #7.Chronic kidney disease stage III. Creatinine 1.5 on admission. Review indicates this is close to baseline -IV fluids -Hold nephrotoxins -Monitor urine output -Recheck in the morning  #8. Chronic combined diastolic and systolic heart failure. Appears compensated. Echo done in April 2017 the cavity size was normal. Wall thickness was   normal. Systolic function was normal. The estimated ejection fraction was in the range of 60% to 65%. Wall motion was normal;  there were no regional wall motion abnormalities. -Home meds as noted above -Daily weights -Monitor intake and output  #9. Anemia. History of anemia iron deficiency. Hemoglobin 12.9. No Symptoms of active bleeding -monitor    DVT prophylaxis: lovenox  Code Status: full  Family Communication: daughter at bedside  Disposition Plan: home  Consults called: none  Admission status: obs    Dyanne Carrel M MD [facility]  If  7PM-7AM, please contact night-coverage [email or web address] Password [email or web address]  11/20/2016, 4:12 PM

## 2016-11-21 ENCOUNTER — Observation Stay (HOSPITAL_COMMUNITY): Payer: Medicare Other

## 2016-11-21 DIAGNOSIS — I5042 Chronic combined systolic (congestive) and diastolic (congestive) heart failure: Secondary | ICD-10-CM

## 2016-11-21 DIAGNOSIS — I480 Paroxysmal atrial fibrillation: Secondary | ICD-10-CM | POA: Diagnosis not present

## 2016-11-21 DIAGNOSIS — G9341 Metabolic encephalopathy: Secondary | ICD-10-CM | POA: Diagnosis present

## 2016-11-21 DIAGNOSIS — N183 Chronic kidney disease, stage 3 (moderate): Secondary | ICD-10-CM

## 2016-11-21 DIAGNOSIS — J189 Pneumonia, unspecified organism: Principal | ICD-10-CM

## 2016-11-21 DIAGNOSIS — R0602 Shortness of breath: Secondary | ICD-10-CM | POA: Diagnosis not present

## 2016-11-21 DIAGNOSIS — J9601 Acute respiratory failure with hypoxia: Secondary | ICD-10-CM | POA: Diagnosis not present

## 2016-11-21 DIAGNOSIS — Z79899 Other long term (current) drug therapy: Secondary | ICD-10-CM | POA: Diagnosis not present

## 2016-11-21 DIAGNOSIS — M810 Age-related osteoporosis without current pathological fracture: Secondary | ICD-10-CM | POA: Diagnosis present

## 2016-11-21 DIAGNOSIS — Z853 Personal history of malignant neoplasm of breast: Secondary | ICD-10-CM | POA: Diagnosis not present

## 2016-11-21 DIAGNOSIS — G9340 Encephalopathy, unspecified: Secondary | ICD-10-CM | POA: Diagnosis not present

## 2016-11-21 DIAGNOSIS — E874 Mixed disorder of acid-base balance: Secondary | ICD-10-CM | POA: Diagnosis present

## 2016-11-21 DIAGNOSIS — J181 Lobar pneumonia, unspecified organism: Secondary | ICD-10-CM | POA: Diagnosis not present

## 2016-11-21 DIAGNOSIS — N179 Acute kidney failure, unspecified: Secondary | ICD-10-CM | POA: Diagnosis present

## 2016-11-21 DIAGNOSIS — Z951 Presence of aortocoronary bypass graft: Secondary | ICD-10-CM | POA: Diagnosis not present

## 2016-11-21 DIAGNOSIS — J9622 Acute and chronic respiratory failure with hypercapnia: Secondary | ICD-10-CM | POA: Diagnosis not present

## 2016-11-21 DIAGNOSIS — I482 Chronic atrial fibrillation: Secondary | ICD-10-CM | POA: Diagnosis not present

## 2016-11-21 DIAGNOSIS — Z9049 Acquired absence of other specified parts of digestive tract: Secondary | ICD-10-CM | POA: Diagnosis not present

## 2016-11-21 DIAGNOSIS — E875 Hyperkalemia: Secondary | ICD-10-CM | POA: Diagnosis present

## 2016-11-21 DIAGNOSIS — E15 Nondiabetic hypoglycemic coma: Secondary | ICD-10-CM | POA: Diagnosis not present

## 2016-11-21 DIAGNOSIS — J9602 Acute respiratory failure with hypercapnia: Secondary | ICD-10-CM | POA: Diagnosis not present

## 2016-11-21 DIAGNOSIS — Z8249 Family history of ischemic heart disease and other diseases of the circulatory system: Secondary | ICD-10-CM | POA: Diagnosis not present

## 2016-11-21 DIAGNOSIS — I2510 Atherosclerotic heart disease of native coronary artery without angina pectoris: Secondary | ICD-10-CM | POA: Diagnosis present

## 2016-11-21 DIAGNOSIS — Z7982 Long term (current) use of aspirin: Secondary | ICD-10-CM | POA: Diagnosis not present

## 2016-11-21 DIAGNOSIS — J9621 Acute and chronic respiratory failure with hypoxia: Secondary | ICD-10-CM | POA: Diagnosis present

## 2016-11-21 DIAGNOSIS — Z933 Colostomy status: Secondary | ICD-10-CM | POA: Diagnosis not present

## 2016-11-21 DIAGNOSIS — I130 Hypertensive heart and chronic kidney disease with heart failure and stage 1 through stage 4 chronic kidney disease, or unspecified chronic kidney disease: Secondary | ICD-10-CM | POA: Diagnosis present

## 2016-11-21 DIAGNOSIS — I25708 Atherosclerosis of coronary artery bypass graft(s), unspecified, with other forms of angina pectoris: Secondary | ICD-10-CM

## 2016-11-21 DIAGNOSIS — E1151 Type 2 diabetes mellitus with diabetic peripheral angiopathy without gangrene: Secondary | ICD-10-CM | POA: Diagnosis present

## 2016-11-21 DIAGNOSIS — D509 Iron deficiency anemia, unspecified: Secondary | ICD-10-CM | POA: Diagnosis present

## 2016-11-21 DIAGNOSIS — E1122 Type 2 diabetes mellitus with diabetic chronic kidney disease: Secondary | ICD-10-CM | POA: Diagnosis present

## 2016-11-21 DIAGNOSIS — E785 Hyperlipidemia, unspecified: Secondary | ICD-10-CM | POA: Diagnosis present

## 2016-11-21 LAB — GLUCOSE, CAPILLARY
GLUCOSE-CAPILLARY: 175 mg/dL — AB (ref 65–99)
GLUCOSE-CAPILLARY: 165 mg/dL — AB (ref 65–99)
Glucose-Capillary: 128 mg/dL — ABNORMAL HIGH (ref 65–99)
Glucose-Capillary: 139 mg/dL — ABNORMAL HIGH (ref 65–99)
Glucose-Capillary: 145 mg/dL — ABNORMAL HIGH (ref 65–99)
Glucose-Capillary: 125 mg/dL — ABNORMAL HIGH (ref 65–99)

## 2016-11-21 LAB — BASIC METABOLIC PANEL
ANION GAP: 10 (ref 5–15)
Anion gap: 4 — ABNORMAL LOW (ref 5–15)
BUN: 52 mg/dL — ABNORMAL HIGH (ref 6–20)
BUN: 55 mg/dL — ABNORMAL HIGH (ref 6–20)
CALCIUM: 8.5 mg/dL — AB (ref 8.9–10.3)
CALCIUM: 8.4 mg/dL — AB (ref 8.9–10.3)
CO2: 35 mmol/L — AB (ref 22–32)
CO2: 32 mmol/L (ref 22–32)
CREATININE: 1.77 mg/dL — AB (ref 0.44–1.00)
Chloride: 100 mmol/L — ABNORMAL LOW (ref 101–111)
Chloride: 97 mmol/L — ABNORMAL LOW (ref 101–111)
Creatinine, Ser: 1.94 mg/dL — ABNORMAL HIGH (ref 0.44–1.00)
GFR, EST AFRICAN AMERICAN: 30 mL/min — AB (ref >60)
GFR, EST AFRICAN AMERICAN: 27 mL/min — AB (ref >60)
GFR, EST NON AFRICAN AMERICAN: 26 mL/min — AB (ref >60)
GFR, EST NON AFRICAN AMERICAN: 23 mL/min — AB (ref >60)
GLUCOSE: 125 mg/dL — AB (ref 65–99)
Glucose, Bld: 86 mg/dL (ref 65–99)
Potassium: 5.4 mmol/L — ABNORMAL HIGH (ref 3.5–5.1)
Potassium: 4.4 mmol/L (ref 3.5–5.1)
SODIUM: 139 mmol/L (ref 135–145)
SODIUM: 139 mmol/L (ref 135–145)

## 2016-11-21 LAB — CBC
HCT: 41.7 % (ref 36.0–46.0)
Hemoglobin: 12.5 g/dL (ref 12.0–15.0)
MCH: 29.2 pg (ref 26.0–34.0)
MCHC: 30 g/dL (ref 30.0–36.0)
MCV: 97.4 fL (ref 78.0–100.0)
PLATELETS: 231 10*3/uL (ref 150–400)
RBC: 4.28 MIL/uL (ref 3.87–5.11)
RDW: 16.3 % — ABNORMAL HIGH (ref 11.5–15.5)
WBC: 9.9 10*3/uL (ref 4.0–10.5)

## 2016-11-21 LAB — BLOOD GAS, ARTERIAL
ACID-BASE EXCESS: 4.9 mmol/L — AB (ref 0.0–2.0)
Bicarbonate: 32.4 mmol/L — ABNORMAL HIGH (ref 20.0–28.0)
Drawn by: 257881
O2 CONTENT: 2 L/min
O2 SAT: 92.3 %
PO2 ART: 73 mmHg — AB (ref 83.0–108.0)
Patient temperature: 98.6
pCO2 arterial: 85.4 mmHg (ref 32.0–48.0)
pH, Arterial: 7.204 — ABNORMAL LOW (ref 7.350–7.450)

## 2016-11-21 LAB — HEMOGLOBIN A1C
HEMOGLOBIN A1C: 8.5 % — AB (ref 4.8–5.6)
Mean Plasma Glucose: 197 mg/dL

## 2016-11-21 LAB — INFLUENZA PANEL BY PCR (TYPE A & B)
INFLAPCR: NEGATIVE
INFLBPCR: NEGATIVE

## 2016-11-21 LAB — MRSA PCR SCREENING: MRSA by PCR: NEGATIVE

## 2016-11-21 LAB — LACTIC ACID, PLASMA: LACTIC ACID, VENOUS: 0.6 mmol/L (ref 0.5–1.9)

## 2016-11-21 MED ORDER — HYDROCODONE-ACETAMINOPHEN 5-325 MG PO TABS: 1.0000 | ORAL_TABLET | ORAL | Status: DC | PRN

## 2016-11-21 MED ORDER — SODIUM CHLORIDE 0.9 % IV SOLN: INTRAVENOUS | Status: DC

## 2016-11-21 MED ORDER — SODIUM CHLORIDE 0.9 % IV SOLN
1.0000 g | Freq: Once | INTRAVENOUS | Status: AC
  Administered 2016-11-21: 1 g via INTRAVENOUS
  Filled 2016-11-21: qty 10

## 2016-11-21 MED ORDER — ORAL CARE MOUTH RINSE
15.0000 mL | Freq: Two times a day (BID) | OROMUCOSAL | Status: DC
  Administered 2016-11-21 – 2016-11-25 (×5): 15 mL via OROMUCOSAL
  Filled 2016-11-21: qty 15

## 2016-11-21 MED ORDER — SODIUM CHLORIDE 0.9 % IV SOLN
INTRAVENOUS | Status: AC
  Administered 2016-11-21: 11:00:00 via INTRAVENOUS

## 2016-11-21 MED ORDER — INSULIN ASPART 100 UNIT/ML ~~LOC~~ SOLN
0.0000 [IU] | SUBCUTANEOUS | Status: DC
  Administered 2016-11-21: 1 [IU] via SUBCUTANEOUS

## 2016-11-21 MED ORDER — NALOXONE HCL 0.4 MG/ML IJ SOLN
INTRAMUSCULAR | Status: AC
  Administered 2016-11-21: 0.4 mg
  Filled 2016-11-21: qty 1

## 2016-11-21 MED ORDER — SODIUM POLYSTYRENE SULFONATE 15 GM/60ML PO SUSP
30.0000 g | Freq: Once | ORAL | Status: AC
  Administered 2016-11-21: 30 g via ORAL
  Filled 2016-11-21: qty 120

## 2016-11-21 MED ORDER — HYDROCODONE-ACETAMINOPHEN 5-325 MG PO TABS: 1.0000 | ORAL_TABLET | Freq: Four times a day (QID) | ORAL | Status: DC | PRN

## 2016-11-21 MED ORDER — ALBUTEROL SULFATE (2.5 MG/3ML) 0.083% IN NEBU
2.5000 mg | INHALATION_SOLUTION | Freq: Two times a day (BID) | RESPIRATORY_TRACT | Status: DC
  Administered 2016-11-21 – 2016-11-25 (×8): 2.5 mg via RESPIRATORY_TRACT
  Filled 2016-11-21 (×9): qty 3

## 2016-11-21 NOTE — Progress Notes (Signed)
Pt is on NIV at this time tolerating it fairly well. RN aware

## 2016-11-21 NOTE — Progress Notes (Signed)
Pt only responded to sternal rub, would open eyes  was nonverbal for about 15 mins. Pt then only knew name. Rapid response called, Dr. Tana Coast notified at this time, resp in to collect ABG.

## 2016-11-21 NOTE — Progress Notes (Addendum)
[facility]                                                                              Patient Demographics  [name], is a 82 y.o. female, DOB - [birth year], UK:[id number]  Admit date - 11/20/2016   Admitting Physician Elwin Mocha, MD  Outpatient Primary MD for the patient is [name], MD  Outpatient specialists:   LOS - 0  days    Chief Complaint  Patient presents with  . Shortness of Breath       Brief summary   Patient is a 82 year old female with CAD, chronic kidney disease, atrial fibrillation, hypertension, diabetes presented with worsening shortness of breath, coughing. Patient reported that several weeks ago she fell and suffered some fractured ribs, initially pain was excruciating and compromised her respiratory status, which eventually improved. However over the last several weeks she developed a cough and shortness of breath. On the day of admission her O2 sats was noticed to be 64% on room air at her PCPs office otherwise no fevers, chills, nausea vomiting or diarrhea. Chest x-ray showed hazy infiltrate/pneumonia in the left lower lobe. Old fracture of the left fourth and fifth and sixth rib.   Assessment & Plan    Principal Problem:   Acute respiratory failure with hypoxia (HCC)With a left lower lobe pneumonia, community-acquired - Continue nebs, antitussives, wean O2 as tolerated, - Continue incentive spirometry, obtain sputum cultures, urine strep antigen, urine for Legionella antigen, blood cultures - Continue azithromax and IV Rocephin Addendum: 4:10PM  Acute hypercarbic respiratory failure Called by RN, patient lethargic and difficult to arouse. I had seen this patient in the morning and had cut down narcotics. Per RN, patient was more alert and awake at lunchtime but again became lethargic. - Rapid response was called, she had not received any narcotics during the day however Stat Narcan given 0.4 mg x1 - Stat ABG obtained  showed pH 7.2, PCO2 85.4, PO2 73, bicarbonate 32.4 - ordered Bipap - however I was called by RN that patient was not tolerating Bipap. Called [facility], d/w Dr Oletta Darter, PCCM, may need intubation. Will transfer to ICU.    Active Problems:  Hypertension.  - Currently borderline low, continue sotalol - Hold benazepril and Norvasc   CAD. Denies chest pain -Continue aspirin, ranexa, beta blocker -Continue statin   Diabetes mellitus.  Home medications include insulin and oral agents - Continue sliding scale insulin, obtain hemoglobin A1c  Mild acute on Chronic kidney disease stage III. Creatinine 1.5 on admission. Review indicates this is close to baseline -Place on gentle hydration, hold benazepril, follow BMET closely   Chronic combined diastolic and systolic heart failure.  - Appears compensated. Echo done in April 2017 the cavity size was normal. Wall thickness was normal. Systolic function was normal. The estimated ejection fraction was in the range of 60% to 65%. Wall motion was normal; there were no regional wall motion abnormalities. - Gentle hydration for 1 L, creatinine 1.7, baseline 1-1.2  Chronic Anemia. History of anemia iron deficiency. Hemoglobin 12.9. No Symptoms of active bleeding -monitor  Hyperkalemia - Hold benazepril, place on gentle hydration,  given calcium gluconate, Kayexalate  Rib fractures - Increased somnolence noted during examination, cut down on narcotics, continue incentive spirometry  Paroxysmal atrial fibrillation - , Rate controlled, on sotalol therapy - While on treatment with aspirin and Plavix, she developed spontaneous retroperitoneal hematoma, hence not on anticoagulation per cardiology  Code Status: Full CODE STATUS  DVT Prophylaxis:  Lovenox Family Communication: Discussed in detail with the patient, all imaging results, lab results explained to the patient   Disposition Plan:PT OT evaluation  Time Spent in minutes  Critical care time  44mins   Procedures:    Consultants:     Antimicrobials:   IV Rocephin 2/16  Zithromax 2/16   Medications  Scheduled Meds: . albuterol  2.5 mg Nebulization BID  . aspirin EC  81 mg Oral Daily  . atorvastatin  20 mg Oral QHS  . azithromycin  500 mg Oral Q24H  . cefTRIAXone (ROCEPHIN)  IV  1 g Intravenous Q24H  . cholecalciferol  1,000 Units Oral BID  . enoxaparin (LOVENOX) injection  30 mg Subcutaneous Q24H  . guaiFENesin  600 mg Oral BID  . insulin aspart  0-15 Units Subcutaneous TID WC  . insulin aspart  0-5 Units Subcutaneous QHS  . ranolazine  500 mg Oral BID  . sotalol  40 mg Oral BID   Continuous Infusions: . sodium chloride    . sodium chloride     PRN Meds:.acetaminophen **OR** acetaminophen, albuterol, HYDROcodone-acetaminophen, meclizine, nitroGLYCERIN, ondansetron **OR** ondansetron (ZOFRAN) IV, sorbitol   Antibiotics   Anti-infectives    Start     Dose/Rate Route Frequency Ordered Stop   11/20/16 1600  cefTRIAXone (ROCEPHIN) 1 g in dextrose 5 % 50 mL IVPB     1 g 100 mL/hr over 30 Minutes Intravenous Every 24 hours 11/20/16 1532 11/27/16 1559   11/20/16 1545  azithromycin (ZITHROMAX) tablet 500 mg     500 mg Oral Every 24 hours 11/20/16 1532 11/27/16 1529   11/20/16 1415  cefTRIAXone (ROCEPHIN) 1 g in dextrose 5 % 50 mL IVPB  Status:  Discontinued     1 g 100 mL/hr over 30 Minutes Intravenous  Once 11/20/16 1404 11/20/16 1554   11/20/16 1415  azithromycin (ZITHROMAX) 500 mg in dextrose 5 % 250 mL IVPB  Status:  Discontinued     500 mg 250 mL/hr over 60 Minutes Intravenous  Once 11/20/16 1404 11/20/16 1555        Subjective:   [name] was seen and examined today. Somewhat somnolent, but arousable. Shortness of breath improving, no fevers or chills. BP borderline. Patient denies dizziness, abdominal pain, N/V/D/C, new weakness, numbess, tingling. No acute events overnight.    Objective:   Vitals:   11/21/16 0224 11/21/16 0453 11/21/16 0500  11/21/16 0809  BP: (!) 108/58 (!) 96/50    Pulse: 62 64    Resp: 16 17    Temp: 97.7 F (36.5 C) 97.5 F (36.4 C)    TempSrc: Axillary Axillary    SpO2: 95% 92%  90%  Weight:   61.1 kg (134 lb 11.2 oz)   Height:        Intake/Output Summary (Last 24 hours) at 11/21/16 1017 Last data filed at 11/21/16 0457  Gross per 24 hour  Intake           808.33 ml  Output              200 ml  Net           608.[place]  ml     Wt Readings from Last 3 Encounters:  11/21/16 61.1 kg (134 lb 11.2 oz)  11/20/16 57.9 kg (127 lb 12 oz)  11/13/16 57.1 kg (125 lb 12.8 oz)     Exam  General:Somnolent but arousable   HEENT:  PERRLA, EOMI, Anicteric Sclera, mucous membranes moist.   Neck: Supple, no JVD, no masses  Cardiovascular: S1 S2 auscultated, no rubs, murmurs or gallops. Regular rate and rhythm.  Respiratory:Decreased breath sounds at the bases   Gastrointestinal: Soft, nontender, nondistended, + bowel sounds  Ext: no cyanosis clubbing or edema  Neuro: Cr N's II- XII. Strength 5/5 upper and lower extremities bilaterally  Skin: No rashes  Psych:somnolent but arousable  Data Reviewed:  I have personally reviewed following labs and imaging studies  Micro Results No results found for this or any previous visit (from the past 240 hour(s)).  Radiology Reports Dg Chest 2 View  Result Date: 11/20/2016 CLINICAL DATA:  Shortness of breath, cough EXAM: CHEST  2 VIEW COMPARISON:  07/14/2016 FINDINGS: Cardiomegaly again noted. Status post CABG. No convincing pulmonary edema. There is hazy airspace disease in left lower lobe retrocardiac best seen on lateral view highly suspicious for pneumonia. Osteopenia and degenerative changes thoracic spine. Old fractures of the left fourth fifth and sixth ribs. IMPRESSION: Cardiomegaly. Status post CABG. Hazy infiltrate/pneumonia in left lower lobe retrocardiac best seen on lateral view. No convincing pulmonary edema. Old fractures of the left fourth fifth  and sixth rib. Electronically Signed   By: Lahoma Crocker M.D.   On: 11/20/2016 13:25    Lab Data:  CBC:  Recent Labs Lab 11/20/16 1358 11/21/16 0812  WBC 9.7 9.9  NEUTROABS 5.6  --   HGB 12.9 12.5  HCT 41.6 41.7  MCV 93.7 97.4  PLT 246 [id number]   Basic Metabolic Panel:  Recent Labs Lab 11/20/16 1358 11/21/16 0812  NA 137 139  K 5.3* 5.4*  CL 97* 100*  CO2 29 35*  GLUCOSE 191* 86  BUN 46* 52*  CREATININE 1.51* 1.77*  CALCIUM 9.1 8.5*   GFR: Estimated Creatinine Clearance: 21.3 mL/min (by C-G formula based on SCr of 1.77 mg/dL (H)). Liver Function Tests: No results for input(s): AST, ALT, ALKPHOS, BILITOT, PROT, ALBUMIN in the last 168 hours. No results for input(s): LIPASE, AMYLASE in the last 168 hours. No results for input(s): AMMONIA in the last 168 hours. Coagulation Profile: No results for input(s): INR, PROTIME in the last 168 hours. Cardiac Enzymes: No results for input(s): CKTOTAL, CKMB, CKMBINDEX, TROPONINI in the last 168 hours. BNP (last 3 results) No results for input(s): PROBNP in the last 8760 hours. HbA1C:  Recent Labs  11/20/16 1358  HGBA1C 8.5*   CBG:  Recent Labs Lab 11/20/16 1754 11/20/16 2200  GLUCAP 155* 133*   Lipid Profile: No results for input(s): CHOL, HDL, LDLCALC, TRIG, CHOLHDL, LDLDIRECT in the last 72 hours. Thyroid Function Tests: No results for input(s): TSH, T4TOTAL, FREET4, T3FREE, THYROIDAB in the last 72 hours. Anemia Panel: No results for input(s): VITAMINB12, FOLATE, FERRITIN, TIBC, IRON, RETICCTPCT in the last 72 hours. Urine analysis:    Component Value Date/Time   COLORURINE YELLOW 10/25/2014 [place] 10/25/2014 1754   LABSPEC 1.018 10/25/2014 1754   PHURINE 7.0 10/25/2014 1754   GLUCOSEU NEGATIVE 10/25/2014 1754   HGBUR NEGATIVE 10/25/2014 1754   HGBUR small 11/27/2010 [place] 10/25/2014 1754   KETONESUR NEGATIVE 10/25/2014 1754   PROTEINUR NEGATIVE 10/25/2014 1754  UROBILINOGEN 1.0 10/25/2014 1754   NITRITE NEGATIVE 10/25/2014 1754   LEUKOCYTESUR SMALL (A) 10/25/2014 1754     Wynn Alldredge M.D. [facility] 11/21/2016, 10:17 AM  Pager: [phone] Between 7am to 7pm - call Pager - [phone]  After 7pm go to [email or web address] - password [email or web address]  Call night coverage person covering after 7pm

## 2016-11-21 NOTE — Progress Notes (Signed)
Pt transferred to [id number] at this time.

## 2016-11-21 NOTE — Evaluation (Signed)
Physical Therapy Evaluation Patient Details Name: [name] MRN: [id number] DOB: 01-15-1935 Today's Date: 11/21/2016   History of Present Illness  Pt is a 82 yo female admitted on 11/20/16 through ED following increased dyspnea and coughing with O2 sats in the 60s. Pt was diagnosed with ARF and CAP. PMH significant for CAD, CKD, A-Fib, HTN, DM, Breast CA right.   Clinical Impression  Pt presents with the above diagnosis and below deficits. Prior to admission, pt was completely independent and living alone in a single level home. Pt has friend and family available to assist PRN. Pt will benefit from continued acute PT services in order to address below deficits prior to discharge home. Pt will benefit from HHPT upon discharge in order to maximize her functional outcomes.     Follow Up Recommendations Home health PT;Supervision for mobility/OOB    Equipment Recommendations  None recommended by PT    Recommendations for Other Services       Precautions / Restrictions Precautions Precautions: Fall Precaution Comments: Had a fall in October 2017 with multiple rib fractures.  Restrictions Weight Bearing Restrictions: No      Mobility  Bed Mobility Overal bed mobility: Needs Assistance Bed Mobility: Supine to Sit     Supine to sit: Min guard     General bed mobility comments: Use of railings and Min guard for safety to EOB  Transfers Overall transfer level: Needs assistance Equipment used: Straight cane Transfers: Sit to/from Stand Sit to Stand: Min guard         General transfer comment: cues for hand placement and min guard for safety  Ambulation/Gait Ambulation/Gait assistance: Min guard;Min assist Ambulation Distance (Feet): 50 Feet Assistive device: Straight cane Gait Pattern/deviations: Step-through pattern;Narrow base of support;Trunk flexed;Drifts right/left Gait velocity: decreased   General Gait Details: slow cadence and minimal LOB x2 with self-recovery  with turning and as she becomes fatigued.   Stairs            Wheelchair Mobility    Modified Rankin (Stroke Patients Only)       Balance Overall balance assessment: Needs assistance Sitting-balance support: No upper extremity supported;Feet supported Sitting balance-Leahy Scale: Good     Standing balance support: Single extremity supported Standing balance-Leahy Scale: Fair                               Pertinent Vitals/Pain Pain Assessment: No/denies pain    Home Living Family/patient expects to be discharged to:: Private residence Living Arrangements: Alone Available Help at Discharge: Friend(s);Available PRN/intermittently Type of Home: House Home Access: Stairs to enter Entrance Stairs-Rails: Right Entrance Stairs-Number of Steps: 4 Home Layout: Two level;Able to live on main level with bedroom/bathroom Home Equipment: Toilet riser;Walker - 2 wheels;Cane - single point;Shower seat;Adaptive equipment;Hand held shower head      Prior Function Level of Independence: Independent         Comments: was independent, driving and using a cane occasionally. was very activie prior to recent hospitalization.      Hand Dominance   Dominant Hand: Right    Extremity/Trunk Assessment   Upper Extremity Assessment Upper Extremity Assessment: LUE deficits/detail;RUE deficits/detail RUE Deficits / Details: ROM limitations into Flex due to significant kyphosis LUE Deficits / Details: ROM limitations due to significant kyphosis    Lower Extremity Assessment Lower Extremity Assessment: Overall WFL for tasks assessed    Cervical / Trunk Assessment Cervical / Trunk Assessment: Kyphotic  Communication   Communication: No difficulties  Cognition Arousal/Alertness: Awake/alert Behavior During Therapy: WFL for tasks assessed/performed Overall Cognitive Status: Within Functional Limits for tasks assessed                      General Comments       Exercises     Assessment/Plan    PT Assessment Patient needs continued PT services  PT Problem List Decreased activity tolerance;Decreased balance;Decreased mobility          PT Treatment Interventions DME instruction;Gait training;Stair training;Functional mobility training;Therapeutic activities;Therapeutic exercise;Balance training    PT Goals (Current goals can be found in the Care Plan section)  Acute Rehab PT Goals Patient Stated Goal: to get home soon PT Goal Formulation: With patient Time For Goal Achievement: 11/28/16 Potential to Achieve Goals: Good    Frequency Min 3X/week   Barriers to discharge        Co-evaluation               End of Session Equipment Utilized During Treatment: Gait belt Activity Tolerance: Patient tolerated treatment well;Patient limited by fatigue Patient left: in chair;with call bell/phone within reach Nurse Communication: Mobility status    Functional Assessment Tool Used: clinical judgement, functional mobility assessment Functional Limitation: Mobility: Walking and moving around Mobility: Walking and Moving Around Current Status [id number]): At least 1 percent but less than 20 percent impaired, limited or restricted Mobility: Walking and Moving Around Goal Status [phone]): 0 percent impaired, limited or restricted    Time: 1105-1140 PT Time Calculation (min) (ACUTE ONLY): 35 min   Charges:   PT Evaluation $PT Eval Moderate Complexity: 1 Procedure PT Treatments $Gait Training: 8-22 mins   PT G Codes:   PT G-Codes **NOT FOR INPATIENT CLASS** Functional Assessment Tool Used: clinical judgement, functional mobility assessment Functional Limitation: Mobility: Walking and moving around Mobility: Walking and Moving Around Current Status [id number]): At least 1 percent but less than 20 percent impaired, limited or restricted Mobility: Walking and Moving Around Goal Status [phone]): 0 percent impaired, limited or restricted     Scheryl Marten PT, DPT  [phone]  11/21/2016, 1:22 PM

## 2016-11-21 NOTE — Significant Event (Signed)
Rapid Response Event Note  Overview:  Called by Rn at request of Dr. Tana Coast Time Called: [id number] Arrival Time: 1600 Event Type: Other (Comment)  Initial Focused Assessment:  Called by RN to evaluate patient at request of Dr. Tana Coast.  On my arrival to patients bedside, Dr. Tana Coast and RN present.  Patient is lethargic, confused, seems to be moving all extremities.  Skin is warm and dry.  Patient is not cooperative with exam.  As per Dr. Tana Coast and RN patient much improved in mental status from earlier this am.     Interventions:  ABG done prior to my arrival.  ABG results given to MD. 7.20, 85, o2 73, 92% on 2 lpm nasal cannula.  BIPAP ordered and placed on patient.  After wearing BIPAP for about 10-15 minutes, patient took mask off and is refusing to wear it.    Plan of Care (if not transferred):  Patient to transfer to SDU.    Event Summary:  RN to call if assistance needed   at      at          [facility], Harlin Rain

## 2016-11-21 NOTE — Consult Note (Signed)
PULMONARY / CRITICAL CARE MEDICINE   Name: [name] MRN: [id number] DOB: 1935-07-01    ADMISSION DATE:  11/20/2016 CONSULTATION DATE:  2/16  REFERRING MD:  [facility] DR. Rai   CHIEF COMPLAINT:  Resp distress   HISTORY OF PRESENT ILLNESS:   82 yo female never smoker with past medical history significant for coronary artery disease, chronic kidney disease, atrial fibrillation, hypertension, diabetes, congestive heart failure ,. Pt presented for a 2 week hx of progressive sob.  Patient fell several weeks prior to admission with rib fractures. She developed a cough and congestion that did not improve with over-the-counter cough medications. She went to PCP and was found to have O2 sats 60% on room airway .  Chest x-ray showed a left-sided pneumonia. Patient was admitted by [facility] hospitalist started on IV antibiotics with Rocephin and Zithromax oxygen and nebulized bronchodilators. Patient's mental status deteriorated on February 17 with more lethargy and difficult to arouse. She had been taking narcotics for pain. She was given Narcan 1 dose. Her ABG showed severe hypercarbia with pH at 7.2 and a PCO2 of 85. Patient was placed on BiPAP. However, patient had agitation and refused to wear the BiPAP. Patient was transported to ICU and 50 cc and was asked for consult. On arrival to ICU , pt is alert and oriented. No increased wob  O2 sats adequate on o2 . She says she does not remember what happened . Just remembers waking up and feeling like she was dying.  Says she took tramadol and vicodin but not for few weeks. Hard to tell if she got vicodin at hospital but did get better with narcan.       PAST MEDICAL HISTORY :  She  has a past medical history of Anemia, iron deficiency; Atrial fibrillation ([place]) (11/28/2010); Cancer ([place]); Chronic kidney disease (CKD), stage II (mild); Coronary artery disease; Diabetes mellitus, type 2 ([place]); Diverticulitis; History of breast cancer; Hyperlipemia; Hypertension;  Nontraumatic retroperitoneal hematoma; Osteoporosis; and Peripheral vascular disease ([place]).  PAST SURGICAL HISTORY: She  has a past surgical history that includes Appendectomy (1962); Dilation and curettage of uterus (1971 ); Dilation and curettage of uterus (1995); Breast surgery (02/01); Coronary artery bypass graft (02/09/02); Shoulder arthroscopy (05/04); Hemorrhoid surgery (09/11/04); Cataract extraction (10/10 and 02/11); Colostomy; Partial colectomy; Cardiac catheterization (2000); Cardiac catheterization (01/25/02); Cardiac catheterization (01/13/07); Cardiac catheterization (02/14/10); and Cardiac catheterization (04/17/2011).  Allergies  Allergen Reactions  . Actos [Pioglitazone] Other (See Comments)    edema  . Metformin And Related Other (See Comments)    Held due to renal function  . Zocor [Simvastatin] Other (See Comments)    Myalgias    No current facility-administered medications on file prior to encounter.    Current Outpatient Prescriptions on File Prior to Encounter  Medication Sig  . amLODipine (NORVASC) 10 MG tablet Take 10 mg by mouth daily.  Marland Kitchen aspirin EC 81 MG tablet Take 81 mg by mouth daily.  Marland Kitchen atorvastatin (LIPITOR) 20 MG tablet TAKE 1 TABLET BY MOUTH AT BEDTIME  . benazepril (LOTENSIN) 20 MG tablet Take 1 tablet (20 mg total) by mouth daily.  . Cholecalciferol (VITAMIN D) 1000 UNITS capsule Take 1,000 Units by mouth 2 (two) times daily.    Marland Kitchen glyBURIDE (DIABETA) 5 MG tablet TAKE 2 TABLETS (10 MG TOTAL) BY MOUTH DAILY WITH BREAKFAST.  Marland Kitchen insulin aspart (NOVOLOG) 100 UNIT/ML injection USE PER SLIDING SCALE--200-250=7 UNITS, 251-300=8 UNITS,301-350=9 UNITS, WITH MEALS  . Insulin Syringe-Needle U-100 (BD INSULIN SYRINGE ULTRAFINE) 31G  X 5/16" 0.3 ML MISC USE DAILY AND AS DIRECTED SLIDING SCALE.  Multiple injections of insulin per day  . meclizine (ANTIVERT) 25 MG tablet Take 1 tablet (25 mg total) by mouth daily as needed. For dizziness  . nitroGLYCERIN (NITROSTAT) 0.4  MG SL tablet Place 1 tablet (0.4 mg total) under the tongue as needed for chest pain. As directed  . nystatin (MYCOSTATIN/NYSTOP) 100000 UNIT/GM POWD Apply up to 3 times a day if needed.  Marland Kitchen RANEXA 500 MG 12 hr tablet TAKE 1 TABLET (500 MG TOTAL) BY MOUTH 2 (TWO) TIMES DAILY. NEED APPOINTMENT FOR MORE REFILLS  . SOTALOL AF 80 MG TABS Take 0.5 tablets (40 mg total) by mouth 2 (two) times daily.  . ONE TOUCH ULTRA TEST test strip USE TO CHECK BLOOD SUGAR 4 TIMES A DAY. INSULIN-DEPENDENT. DIAGNOSIS: E11.9    FAMILY HISTORY:  Her indicated that her mother is deceased. She indicated that her father is deceased. She indicated that two of her three brothers are alive. She indicated that her maternal grandmother is deceased. She indicated that her maternal grandfather is deceased. She indicated that her paternal grandmother is deceased. She indicated that her paternal grandfather is deceased. She indicated that her daughter is alive. She indicated that her son is alive.    SOCIAL HISTORY: She  reports that she has never smoked. She has never used smokeless tobacco. She reports that she does not drink alcohol or use drugs.  REVIEW OF SYSTEMS:   Constitutional:   No  weight loss, night sweats,  Fevers, chills, + fatigue, or  lassitude.  HEENT:   No headaches,  Difficulty swallowing,  Tooth/dental problems, or  Sore throat,                No sneezing, itching, ear ache, nasal congestion, post nasal drip,   CV:  No chest pain,  Orthopnea, PND, swelling in lower extremities, anasarca, dizziness, palpitations, syncope.   GI  No heartburn, indigestion, abdominal pain, nausea, vomiting, diarrhea, change in bowel habits, loss of appetite, bloody stools.   Resp:+ sob   Skin: no rash or lesions.  GU: no dysuria, change in color of urine, no urgency or frequency.  No flank pain, no hematuria   MS:  No joint pain or swelling.  No decreased range of motion.  No back pain.  Psych:  +confusion /lethargy     ]  SUBJECTIVE:  Lethargy    VITAL SIGNS: BP (!) 99/51 (BP Location: Right Arm)   Pulse 60   Temp 97.7 F (36.5 C) (Oral)   Resp 18   Ht 5\' 3"  (1.6 m)   Wt 134 lb 11.2 oz (61.1 kg)   SpO2 91%   BMI 23.86 kg/m   HEMODYNAMICS:    VENTILATOR SETTINGS:    INTAKE / OUTPUT: I/O last 3 completed shifts: In: 808.3 [P.O.:340; I.V.:418.3; IV Piggyback:50] Out: 200 [Urine:200]  PHYSICAL EXAMINATION: General: [place] /AT,  Neuro:  A/Ox 3 , MAEW x 4 , f/c  HEENT: dry mucosa  Cardiovascular:  RRR  Lungs: CTA , no wheezing  Abdomen:  Soft BS + , colostomy w/ pink stoma,  Musculoskeletal: intact  Skin:  Intact w/ no rash   LABS:  BMET  Recent Labs Lab 11/20/16 1358 11/21/16 0812  NA 137 139  K 5.3* 5.4*  CL 97* 100*  CO2 29 35*  BUN 46* 52*  CREATININE 1.51* 1.77*  GLUCOSE 191* 86    Electrolytes  Recent Labs Lab 11/20/16 1358  11/21/16 0812  CALCIUM 9.1 8.5*    CBC  Recent Labs Lab 11/20/16 1358 11/21/16 0812  WBC 9.7 9.9  HGB 12.9 12.5  HCT 41.6 41.7  PLT 246 231    Coag's No results for input(s): APTT, INR in the last 168 hours.  Sepsis Markers  Recent Labs Lab 11/20/16 1418  LATICACIDVEN 1.85    ABG  Recent Labs Lab 11/21/16 1600  PHART 7.204*  PCO2ART 85.4*  PO2ART 73.0*    Liver Enzymes No results for input(s): AST, ALT, ALKPHOS, BILITOT, ALBUMIN in the last 168 hours.  Cardiac Enzymes No results for input(s): TROPONINI, PROBNP in the last 168 hours.  Glucose  Recent Labs Lab 11/20/16 1754 11/20/16 2200 11/21/16 1021 11/21/16 1134 11/21/16 1535 11/21/16 1657  GLUCAP 155* 133* 128* 139* 175* 145*    Imaging No results found.   STUDIES:    CULTURES: 2/16 BC x 2 > 2/16 sputum >>  ANTIBIOTICS: 2/16 Rocephin >> 2/16 Zithromax >>  SIGNIFICANT EVENTS: 2/16 admitted by [facility] with CAP  2/17 Lethargic/Hypercarbic transferred to ICU   LINES/TUBES:   DISCUSSION: 82 yo female never smoker admitted 2/16  with LLL CAP (recent fall with rib fx) with worsening mental status from hypercarbia.   ASSESSMENT / PLAN:  PULMONARY A: Acute Hypercarbic hypoxic Resp Failure  LLL PNA  P:   Check cxr  Continue IV abx w/ Zithr /Rocephin  O2 to keep O2 sat >90%  BIPAP As needed   Cont on albuterol neb Twice daily    CARDIOVASCULAR A:  HTN  CHF   P:  Cont on Sotalol  Lotensin /norvasc on hold   RENAL A:   Acute on chronic renal Failure (baseline ~1.2 )  P:   Monitor scr /uop   GASTROINTESTINAL A:    P:   NPO    HEMATOLOGIC A:   Anemia  P:  Cont on Lovenox  Tr cbc   INFECTIOUS A:   LLL PNA  P:   Tr wbc/Temp curve  Cont IV abx  Check PCT  Follow cx data   ENDOCRINE A:   DM   P:   SSI   NEUROLOGIC A:   Metabolic encephalopathy -severe hypercarbia  P:   Limit sedating rx    FAMILY  - Updates: family updated   - Inter-disciplinary family meet or Palliative Care meeting due by:  2/24    [name] Parrett NP-C  Pulmonary and [place] Pager: [phone]  11/21/2016, 5:16 PM

## 2016-11-22 DIAGNOSIS — J9602 Acute respiratory failure with hypercapnia: Secondary | ICD-10-CM

## 2016-11-22 DIAGNOSIS — E15 Nondiabetic hypoglycemic coma: Secondary | ICD-10-CM

## 2016-11-22 DIAGNOSIS — I482 Chronic atrial fibrillation: Secondary | ICD-10-CM

## 2016-11-22 LAB — BASIC METABOLIC PANEL
Anion gap: 8 (ref 5–15)
BUN: 53 mg/dL — ABNORMAL HIGH (ref 6–20)
CHLORIDE: 101 mmol/L (ref 101–111)
CO2: 32 mmol/L (ref 22–32)
CREATININE: 1.8 mg/dL — AB (ref 0.44–1.00)
Calcium: 8.2 mg/dL — ABNORMAL LOW (ref 8.9–10.3)
GFR calc non Af Amer: 26 mL/min — ABNORMAL LOW (ref >60)
GFR, EST AFRICAN AMERICAN: 30 mL/min — AB (ref >60)
Glucose, Bld: 108 mg/dL — ABNORMAL HIGH (ref 65–99)
POTASSIUM: 4.4 mmol/L (ref 3.5–5.1)
Sodium: 141 mmol/L (ref 135–145)

## 2016-11-22 LAB — GLUCOSE, CAPILLARY
GLUCOSE-CAPILLARY: 42 mg/dL — AB (ref 65–99)
GLUCOSE-CAPILLARY: 113 mg/dL — AB (ref 65–99)
GLUCOSE-CAPILLARY: 119 mg/dL — AB (ref 65–99)
GLUCOSE-CAPILLARY: 212 mg/dL — AB (ref 65–99)
GLUCOSE-CAPILLARY: 259 mg/dL — AB (ref 65–99)
GLUCOSE-CAPILLARY: 188 mg/dL — AB (ref 65–99)
Glucose-Capillary: 53 mg/dL — ABNORMAL LOW (ref 65–99)
Glucose-Capillary: 92 mg/dL (ref 65–99)
Glucose-Capillary: 305 mg/dL — ABNORMAL HIGH (ref 65–99)

## 2016-11-22 LAB — URINALYSIS, ROUTINE W REFLEX MICROSCOPIC
Bilirubin Urine: NEGATIVE
GLUCOSE, UA: NEGATIVE mg/dL
Hgb urine dipstick: NEGATIVE
Ketones, ur: NEGATIVE mg/dL
LEUKOCYTES UA: NEGATIVE
NITRITE: NEGATIVE
PROTEIN: NEGATIVE mg/dL
Specific Gravity, Urine: 1.017 (ref 1.005–1.030)
pH: 5 (ref 5.0–8.0)

## 2016-11-22 LAB — CBC
HEMATOCRIT: 38.9 % (ref 36.0–46.0)
Hemoglobin: 11.4 g/dL — ABNORMAL LOW (ref 12.0–15.0)
MCH: 28.5 pg (ref 26.0–34.0)
MCHC: 29.3 g/dL — ABNORMAL LOW (ref 30.0–36.0)
MCV: 97.3 fL (ref 78.0–100.0)
PLATELETS: 182 10*3/uL (ref 150–400)
RBC: 4 MIL/uL (ref 3.87–5.11)
RDW: 16.2 % — ABNORMAL HIGH (ref 11.5–15.5)
WBC: 10.2 10*3/uL (ref 4.0–10.5)

## 2016-11-22 LAB — STREP PNEUMONIAE URINARY ANTIGEN: Strep Pneumo Urinary Antigen: NEGATIVE

## 2016-11-22 MED ORDER — DEXTROSE 50 % IV SOLN
INTRAVENOUS | Status: AC
  Administered 2016-11-22 (×2): 25 mL
  Filled 2016-11-22: qty 50

## 2016-11-22 MED ORDER — DEXTROSE-NACL 5-0.45 % IV SOLN
INTRAVENOUS | Status: DC
  Administered 2016-11-22: 50 mL via INTRAVENOUS
  Administered 2016-11-23: 02:00:00 via INTRAVENOUS

## 2016-11-22 NOTE — Progress Notes (Signed)
Pt ripped mask off. Pt sats are now in the low 70's. Pt is refusing O2 therapy via BiPAP, and via Nasal Cannula. Pt is refusing Nursing care.  Pt is now combative and fussing at time claiming that staff is trying to hurt her. Dr. Corrie Dandy camera in room trying to help patient, pt is still combative and refusing to talk to MD and staff. Pt states "leave me be". Pt wants no one bothering her. MD aware.

## 2016-11-22 NOTE — Progress Notes (Signed)
Pt sats continue to drop. MD made aware. Still refusing to answer any questions.

## 2016-11-22 NOTE — Progress Notes (Signed)
eLink Physician-Brief Progress Note Patient Name: [name] DOB: 1934-11-02 MRN: [id number]   Date of Service  11/22/2016  HPI/Events of Note  Pt was transferred to the ICU for respiratory distress. She had hypercapnia on ABG.   She was briefly on the BiPAP and eventually refused it. For a while, she was on O2 but throughout the night, she refused it.   I camerad in with the RN in the room.  She did not want to speak with [place].  She told us not to bother her.  She would not answer our questions.  I had to call patient's daughter and son and discussed with them was going on.   On room air, her O2 saturation was in the mid 70s.   I told the children that they need to speak with the patient and discuss with her what her wishes are.   We need to determine if she is delirious and confused or not. If she is delirious or confused, the children will decide for the patient. I have asked them what they wanted to do for their mother and they will decide on it.   In the meantime, we will able to put restraints on her and able to place oxygen. On 2 L nasal cannula, her O2 saturation is 88%. She looks comfortable, blood pressure 98/53, heart rate 70.     eICU Interventions  Cont O2 2L for now, keep o2 sats > 88%  I told the family to determine goals of care for the patient.   May need to be intubated if her distress worsens.      Intervention Category Major Interventions: Hypoxemia - evaluation and management Evaluation Type: Other  [place] 11/22/2016, 12:46 AM

## 2016-11-22 NOTE — Progress Notes (Signed)
Tried to placed patient on Nasal Cannula due to desaturation SATs are currently 77% on RA. Pt is still very unpleasant and combative.

## 2016-11-22 NOTE — Progress Notes (Signed)
Pt blood sugar 42 at 01:24. Half an amp of d50 given, blood sugar up to 113. Family arrived on unit and spoke with the patient. Pt still confused and paranoid stating she does not trust anyone here and she does not want anyone but family in the room. Patient became alert, oriented and very pleasant. States she does not remember being placed on the bipap she just remembers waking up and being very scared. Bipap placed back on patient at 3:30.  Blood sugar at 0413 was 53. Patient alert and oriented. Half an amp of D50 given and sugar at 04:37 is 119. Patient tolerating bipap and is asleep at this time. Support given to patient and family.

## 2016-11-22 NOTE — Progress Notes (Signed)
eLink Physician-Brief Progress Note Patient Name: [name] DOB: 22-Mar-1935 MRN: [id number]   Date of Service  11/22/2016  HPI/Events of Note  Patient remains stable from the respiratory point of view as long she is on nasal cannula.   Patient seen, comfortable, not in distress. Blood pressure 98/49, pulse 72, respiratory rate 18, O2 sats 100% on nasal cannula.   Son and daughter were at bedside. They state patient is delirious and paranoid. She is afraid being by herself in hospital. She recognizes her children. Per them, she is talking out of her head.   I discussed with them potentially if she gets worse with her breathing, we may need to intubate and she needs to be sedated for that. We can also try BiPAP but I'm not sure if she will tolerate that given her altered mental status.   Son agrees to full code for now as the assumption is it something reversible with potential infection, pneumonia.   I encouraged the children to speak with their mother regarding the care that she is getting.  This will make their mother "more comfortable"  eICU Interventions       Intervention Category Evaluation Type: Other  [place] 11/22/2016, 2:48 AM

## 2016-11-22 NOTE — Progress Notes (Signed)
Elink notified of CBG 305. No new orders at this time

## 2016-11-22 NOTE — Progress Notes (Signed)
Pt is on her NIV at this time no complications noted.

## 2016-11-22 NOTE — Progress Notes (Signed)
eLink Physician-Brief Progress Note Patient Name: AYVIANA CANTO DOB: 1935-03-21 MRN: [id number]   Date of Service  11/22/2016  HPI/Events of Note  Patient remains comfortable. Saturating 97% on 3 L. Blood pressure 101/52, heart rate 80.   Family and nurse have been speaking with the patient. She is more oriented and more with it.   She is okay with trying BiPAP if needed. She is okay with oxygen.   She had an episode of hypoglycemia earlier improved with an amp of D50 50.   eICU Interventions  Try BiPAP as needed.  Continue oxygen, keep sats more than 88%.  We'll switch to D5 containing IV fluids >> if pt eats today >> may need to d/c IVF.   Told RN to let pt sleep now, otherwise delirium will get worse.      Intervention Category Intermediate Interventions: Other: Evaluation Type: Other  [place] 11/22/2016, 3:13 AM

## 2016-11-22 NOTE — Progress Notes (Addendum)
[facility] Attending Note  Presenting HPI:  82 y.o. female transferred to the intensive care unit 2/17 with altered mentation and acute hypercarbic respiratory failure. Known coronary artery disease, chronic renal failure, atrial fibrillation, hypertension, diabetes mellitus, and congestive heart failure. Presented with 2-3 weeks of progressive shortness of breath after her previous fall with rib fractures. Patient awakening in the morning with confusion and difficulty with deep inspiration. Upon arrival in the ICU patient was awake, alert, and oriented in no respiratory distress. Patient was hyperoxygenated with nasal cannula oxygen.  Subjective:  Patient did have transient hypoglycemia overnight and was started on a D5 infusion. Patient received a total of 5 units of insulin yesterday subcutaneous. Last dose of insulin with 1 unit at 8:21 PM. Patient was nothing by mouth however yesterday. Patient was on BiPAP some overnight. She reports she was able to fall asleep with it on. Denies any dyspnea this morning or difficulty breathing. Denies any cough. Denies any chest pain or pressure.  Review of Systems:  Denies any subjective fever or chills. no abdominal pain or nausea. Tolerating diet. No headache or vision changes.  Temp:  [97.2 F (36.2 C)-98.5 F (36.9 C)] 97.7 F (36.5 C) (02/18 0755) Pulse Rate:  [60-77] 72 (02/18 0600) Resp:  [9-32] 20 (02/18 0600) BP: (88-117)/(47-91) 105/68 (02/18 0600) SpO2:  [87 %-100 %] 97 % (02/18 0750) FiO2 (%):  [40 %] 40 % (02/18 0400)  General:  Awake. No distress. Alert. Eating breakfast/grits. Integument:  Warm & dry. No rash or bruising on exposed skin. HEENT:  No scleral icterus or injection. Pupils symmetric.  Pulmonary:  Clear bilaterally to auscultation. No accessory muscle use on nasal cannula oxygen. Good aeration bilaterally.  Cardiovascular:  Regular rate. No JVD apprecaited. No edema. Abdomen:  Soft. Nontender. Normal  bowel sounds. Neurological:  Cranial nerves grossly in tact. No meningismus. Oriented x4.   CBC Latest Ref Rng & Units 11/22/2016 11/21/2016 11/20/2016  WBC 4.0 - 10.5 K/uL 10.2 9.9 9.7  Hemoglobin 12.0 - 15.0 g/dL 11.4(L) 12.5 12.9  Hematocrit 36.0 - 46.0 % 38.9 41.7 41.6  Platelets 150 - 400 K/uL 182 231 246    BMP Latest Ref Rng & Units 11/22/2016 11/21/2016 11/21/2016  Glucose 65 - 99 mg/dL 108(H) 125(H) 86  BUN 6 - 20 mg/dL 53(H) 55(H) 52(H)  Creatinine 0.44 - 1.00 mg/dL 1.80(H) 1.94(H) 1.77(H)  Sodium 135 - 145 mmol/L 141 139 139  Potassium 3.5 - 5.1 mmol/L 4.4 4.4 5.4(H)  Chloride 101 - 111 mmol/L 101 97(L) 100(L)  CO2 22 - 32 mmol/L 32 32 35(H)  Calcium 8.9 - 10.3 mg/dL 8.2(L) 8.4(L) 8.5(L)    IMAGING/STUDIES: PORT CXR 2/17:  Previously reviewed by me. Minimal patchy left basilar opacity and right basilar alveolar opacity. Low lung volumes.  MICROBIOLOGY: Blood Cultures x2 2/16 >>> Influenza A/B PCR 2/17:  Negative MRSA PCR 2/17:  Negative  Urine Streptococcal Antigen 2/17:  Negative  Urine Legionella Antigen 2/17 >>>  ANTIBIOTICS: Rocephin 2/16 >>> Azithromycin 2/16 >>>  SIGNIFICANT EVENTS: 02/16 - Admit 02/17 - Transfer to ICU with hypercarbia and altered mentation  ASSESSMENT/PLAN:  82 y.o. Female with known history of multiple medical problems transferred to the ICU yesterday with acute hypercarbic respiratory failure and altered mentation. These had significantly improved at the time of arrival in the ICU. Patient was on noninvasive positive pressure ventilation briefly overnight. Interestingly the patient did have hypoglycemia this morning but was not symptomatic. Her home medications for diabetes  include NovoLog as well as glyburide. Given the patient's continued respiratory stability I feel it's reasonable to transition to a stepdown unit for further treatment and monitoring. She is tolerating a diet this morning but continuing her dextrose infusion I feel is the  safest course of action at this time. I'm still concerned that her hypercarbia could be due to some hypoventilation with her rib fractures but alternatively if she is having early morning hypoglycemia causing altered mental status this too could be a contributing factor.   1. Acute encephalopathy: Resolved. Multifactorial. Likely secondary to hypercarbia and also hypoglycemia. Avoiding narcotics at this time. Fall risk precautions & neuro checks every 4 hours scheduled. 2. Acute on chronic hypercarbic respiratory failure: Question possible hypoventilation versus occult sleep apnea. Continuing BiPAP as needed and at night while sleeping. Repeat venous blood gas tomorrow morning with a.m. Labs. 3. Hypoglycemia: Discontinuing insulin sliding scale. Continuing Accu-Cheks every 4 hour with M.D. notification parameters. Continuing D5 infusion for now. Continuing to hold home glyburide. 4. Community acquired pneumonia: Awaiting finalization of cultures as well as urine Legionella antigen. Continuing empiric Rocephin & azithromycin. 5. Hyperkalemia: Likely secondary to acidosis. Resolved. Monitoring electrolytes daily. Continuous telemetry monitoring. 6. Diabetes mellitus: Hypoglycemic episodes. Holding glyburide as well as sliding scale insulin. 7. Acute on chronic renal failure: Improving. Monitoring urine output. Trending electrolytes and renal function daily. Avoiding nephrotoxic agents. 8. History of hypertension/CHF: Currently normotensive. Holding Lotensin and Norvasc.  9. Atrial fibrillation: Continuous telemetry monitoring. Continuing sotalol. 10. Hyperlipidemia: Continuing home Lipitor. 11. Coronary artery disease: Continuing enteric-coated aspirin & Lipitor.  Prophylaxis: SCDs & Lovenox subcutaneous daily. Diet: Heart healthy diet. Code Status:  Full code per previous discussion with patient. Disposition: Transitioning to stepdown unit. Family Update:  No family at bedside 2/18. Patient updated  at bedside 2/18 by Dr. Ashok Cordia.   I have spent a total of 36 minutes of time today caring for the patient, reviewing the patient's electronic medical record, and with more than 50% of that time spent coordinating care with the patient as well as reviewing the continuing plan of care with the  patient at bedside.  TRH to assume care & PCCM signing off as of 2/19.   Sonia Baller Ashok Cordia, M.D. [facility] Pager:  [phone] After 3pm or if no response, call [phone] 9:01 AM 11/22/16

## 2016-11-22 NOTE — Progress Notes (Signed)
Pt placed on bipap. Tolerated well for about 10 minutes. Pt ripped mask off and is refusing to put nasal canula on. Sats dropped to the 70s. Patient started yelling and became combative, reoriented and redirected. Educated patient on the importance of wearing her oxygen. MD made aware. Pt still refusing mask and [place], refusing to answer question. Restraints placed on patient and [place] placed on 2L with sats at 88%.

## 2016-11-23 LAB — BLOOD GAS, VENOUS
ACID-BASE EXCESS: 7.4 mmol/L — AB (ref 0.0–2.0)
Bicarbonate: 33.4 mmol/L — ABNORMAL HIGH (ref 20.0–28.0)
Delivery systems: POSITIVE
FIO2: 40
O2 SAT: 83.4 %
PATIENT TEMPERATURE: 98.6
PCO2 VEN: 67.2 mmHg — AB (ref 44.0–60.0)
PEEP: 5 cmH2O
PH VEN: 7.317 (ref 7.250–7.430)
Pressure control: 7 cmH2O
RATE: 15 resp/min
pO2, Ven: 49.1 mmHg — ABNORMAL HIGH (ref 32.0–45.0)

## 2016-11-23 LAB — CBC WITH DIFFERENTIAL/PLATELET
BASOS PCT: 0 %
Basophils Absolute: 0 10*3/uL (ref 0.0–0.1)
EOS ABS: 0.1 10*3/uL (ref 0.0–0.7)
Eosinophils Relative: 1 %
HCT: 39.8 % (ref 36.0–46.0)
Hemoglobin: 11.7 g/dL — ABNORMAL LOW (ref 12.0–15.0)
LYMPHS ABS: 2.7 10*3/uL (ref 0.7–4.0)
Lymphocytes Relative: 26 %
MCH: 28.7 pg (ref 26.0–34.0)
MCHC: 29.4 g/dL — AB (ref 30.0–36.0)
MCV: 97.5 fL (ref 78.0–100.0)
MONO ABS: 2.7 10*3/uL — AB (ref 0.1–1.0)
MONOS PCT: 26 %
Neutro Abs: 4.9 10*3/uL (ref 1.7–7.7)
Neutrophils Relative %: 47 %
PLATELETS: 190 10*3/uL (ref 150–400)
RBC: 4.08 MIL/uL (ref 3.87–5.11)
RDW: 16.3 % — AB (ref 11.5–15.5)
WBC: 10.4 10*3/uL (ref 4.0–10.5)

## 2016-11-23 LAB — GLUCOSE, CAPILLARY
GLUCOSE-CAPILLARY: 188 mg/dL — AB (ref 65–99)
GLUCOSE-CAPILLARY: 81 mg/dL (ref 65–99)
Glucose-Capillary: 144 mg/dL — ABNORMAL HIGH (ref 65–99)
Glucose-Capillary: 143 mg/dL — ABNORMAL HIGH (ref 65–99)
Glucose-Capillary: 175 mg/dL — ABNORMAL HIGH (ref 65–99)
Glucose-Capillary: 168 mg/dL — ABNORMAL HIGH (ref 65–99)
Glucose-Capillary: 241 mg/dL — ABNORMAL HIGH (ref 65–99)

## 2016-11-23 LAB — URINE CULTURE: Culture: <10000 — AB

## 2016-11-23 LAB — RENAL FUNCTION PANEL
ALBUMIN: 2.9 g/dL — AB (ref 3.5–5.0)
Anion gap: 9 (ref 5–15)
BUN: 29 mg/dL — AB (ref 6–20)
CHLORIDE: 100 mmol/L — AB (ref 101–111)
CO2: 31 mmol/L (ref 22–32)
Calcium: 8.5 mg/dL — ABNORMAL LOW (ref 8.9–10.3)
Creatinine, Ser: 1.21 mg/dL — ABNORMAL HIGH (ref 0.44–1.00)
GFR calc Af Amer: 48 mL/min — ABNORMAL LOW (ref >60)
GFR calc non Af Amer: 41 mL/min — ABNORMAL LOW (ref >60)
GLUCOSE: 181 mg/dL — AB (ref 65–99)
PHOSPHORUS: 3.3 mg/dL (ref 2.5–4.6)
Potassium: 4.5 mmol/L (ref 3.5–5.1)
Sodium: 140 mmol/L (ref 135–145)

## 2016-11-23 LAB — LEGIONELLA PNEUMOPHILA SEROGP 1 UR AG: L. pneumophila Serogp 1 Ur Ag: NEGATIVE

## 2016-11-23 LAB — MAGNESIUM: Magnesium: 1.8 mg/dL (ref 1.7–2.4)

## 2016-11-23 MED ORDER — INSULIN ASPART 100 UNIT/ML ~~LOC~~ SOLN
0.0000 [IU] | Freq: Three times a day (TID) | SUBCUTANEOUS | Status: DC
  Administered 2016-11-23 (×2): 2 [IU] via SUBCUTANEOUS
  Administered 2016-11-24 – 2016-11-25 (×2): 3 [IU] via SUBCUTANEOUS

## 2016-11-23 NOTE — Evaluation (Signed)
Occupational Therapy Evaluation Patient Details Name: [name] MRN: [id number] DOB: 10-31-1934 Today's Date: 11/23/2016    History of Present Illness Pt is a 82 yo female admitted on 11/20/16 through ED following increased dyspnea and coughing with O2 sats in the 60s. Pt was diagnosed with ARF and CAP. PMH significant for CAD, CKD, A-Fib, HTN, DM, Breast CA right.    Clinical Impression   Pt was independent prior to admission. She with confusion regarding situation, but otherwise intact cognitively this visit. Pt presents with decreased activity tolerance and impaired standing balance interfering with ability to perform ADL and ADL transfers independently. Will follow acutely.     Follow Up Recommendations  Home health OT;Supervision/Assistance - 24 hour    Equipment Recommendations  None recommended by OT    Recommendations for Other Services       Precautions / Restrictions Precautions Precautions: Fall Restrictions Weight Bearing Restrictions: No      Mobility Bed Mobility Overal bed mobility: Needs Assistance Bed Mobility: Sit to Supine       Sit to supine: Min guard   General bed mobility comments: no physical assist  Transfers Overall transfer level: Needs assistance Equipment used: 1 person hand held assist Transfers: Sit to/from Stand Sit to Stand: Min guard         General transfer comment: min guard for safety    Balance Overall balance assessment: Needs assistance   Sitting balance-Leahy Scale: Good       Standing balance-Leahy Scale: Fair                              ADL Overall ADL's : Needs assistance/impaired Eating/Feeding: Independent;Sitting   Grooming: Wash/dry hands;Wash/dry face;Sitting;Set up   Upper Body Bathing: Minimal assistance;Sitting   Lower Body Bathing: Min guard;Sit to/from stand   Upper Body Dressing : Set up;Sitting   Lower Body Dressing: Min guard;Sit to/from stand   Toilet Transfer: Min  guard;Ambulation;BSC   Toileting- Water quality scientist and Hygiene: Min guard;Sit to/from stand         General ADL Comments: Pt with stable vitals on 2L.     Vision Baseline Vision/History: Wears glasses Wears Glasses: Reading only Patient Visual Report: No change from baseline       Perception     Praxis      Pertinent Vitals/Pain Pain Assessment: No/denies pain     Hand Dominance Right   Extremity/Trunk Assessment Upper Extremity Assessment Upper Extremity Assessment: Overall WFL for tasks assessed (shoulder ROM limited by kyphosis)   Lower Extremity Assessment Lower Extremity Assessment: Defer to PT evaluation   Cervical / Trunk Assessment Cervical / Trunk Assessment: Kyphotic   Communication Communication Communication: No difficulties   Cognition Arousal/Alertness: Awake/alert Behavior During Therapy: WFL for tasks assessed/performed Overall Cognitive Status: Impaired/Different from baseline  Orientation Level: Disoriented to;Situation           Problem Solving: Slow processing;Requires verbal cues General Comments: Pt engaging in conversation appropriately.   General Comments       Exercises       Shoulder Instructions      Home Living Family/patient expects to be discharged to:: Private residence Living Arrangements: Alone Available Help at Discharge: Friend(s);Available PRN/intermittently Type of Home: House Home Access: Stairs to enter [employer] of Steps: 4 Entrance Stairs-Rails: Right Home Layout: Two level;Able to live on main level with bedroom/bathroom     Bathroom Shower/Tub: Occupational psychologist:  Standard     Home Equipment: Toilet riser;Walker - 2 wheels;Cane - single point;Shower seat;Adaptive equipment;Hand held shower head Adaptive Equipment: Reacher        Prior Functioning/Environment Level of Independence: Independent        Comments: was independent, driving and using a cane  occasionally. was very active prior to hospitalization.         OT Problem List: Decreased activity tolerance;Impaired balance (sitting and/or standing);Decreased cognition;Decreased knowledge of use of DME or AE;Decreased strength      OT Treatment/Interventions: Self-care/ADL training;Energy conservation;DME and/or AE instruction;Patient/family education;Balance training;Cognitive remediation/compensation;Therapeutic activities    OT Goals(Current goals can be found in the care plan section) Acute Rehab OT Goals Patient Stated Goal: to get my breathing right OT Goal Formulation: With patient Time For Goal Achievement: 12/07/16 Potential to Achieve Goals: Good ADL Goals Pt Will Perform Grooming: with supervision;standing (3 activities) Pt Will Perform Lower Body Bathing: with supervision;sit to/from stand Pt Will Perform Lower Body Dressing: with supervision;sit to/from stand Pt Will Transfer to Toilet: with supervision;ambulating Pt Will Perform Toileting - Clothing Manipulation and hygiene: with supervision;sit to/from stand Additional ADL Goal #1: Pt will recall 3 energy conservation strategies as instructed.  OT Frequency: Min 2X/week   Barriers to D/C:            Co-evaluation              End of Session Equipment Utilized During Treatment: Oxygen (2L)  Activity Tolerance: Patient tolerated treatment well Patient left: in bed;with call bell/phone within reach;with family/visitor present  OT Visit Diagnosis: Unsteadiness on feet (R26.81);Muscle weakness (generalized) (M62.81);Other symptoms and signs involving cognitive function                ADL either performed or assessed with clinical judgement  Time: 1200-1224 OT Time Calculation (min): 24 min Charges:  OT General Charges $OT Visit: 1 Procedure OT Evaluation $OT Eval Moderate Complexity: 1 Procedure OT Treatments $Self Care/Home Management : 8-22 mins G-Codes:       Malka So 11/23/2016,  1:24 PM  [phone]

## 2016-11-23 NOTE — Care Management Note (Addendum)
Case Management Note  Patient Details  Name: ZERA AMMANN MRN: [id number] Date of Birth: June 15, 1935  Subjective/Objective:   Pt admitted with acute resp failure           Action/Plan:   PTA independent, uses cane for assistance with mobility, from home alone.  CM communicated PT/OT HH and DME recommendations - pt is in [facility] with [facility] and DME.  Pt stated she would work on obtaining 24/7 supervision as recommended - but stated she would refuse SNF if arrangements could not be worked out and if SNF was therefore recommended. Pt offered  HH/DME choice and chose [facility].  CM requested both HH/DME and Face to Face order via physician sticky tab - arrangements have not been made as of yet.   Pt will likely need home oxygen - CM spoke with bedside nurse regarding Ambulation note closer to discharge.  CM will continue to follow for discharge needs   Expected Discharge Date:                  Expected Discharge Plan:  [place]  In-House Referral:     Discharge planning Services  CM Consult  Post Acute Care Choice:    Choice offered to:     DME Arranged:    DME Agency:     HH Arranged:    HH Agency:     Status of Service:  In process, will continue to follow  If discussed at Long Length of Stay Meetings, dates discussed:    Additional Comments:  Maryclare Labrador, RN 11/23/2016, 2:24 PM

## 2016-11-23 NOTE — Progress Notes (Signed)
Physical Therapy Treatment Patient Details Name: [name] MRN: [id number] DOB: 1934-12-13 Today's Date: 11/23/2016    History of Present Illness Pt is a 82 yo female admitted on 11/20/16 through ED following increased dyspnea and coughing with O2 sats in the 60s. Pt was diagnosed with ARF and CAP. PMH significant for CAD, CKD, A-Fib, HTN, DM, Breast CA right.  Pt transferred to 8m late sturday 2/17 in the afternoon.    PT Comments    Pt oriented but confused about situation and cpap machine. Pt with increased amb tolerance this date with RW however pt SpO2 dec into 70s and 80s on 2LO2 via [place] during ambulation. Pt pleasant and denies SOB but reports feeling more tired than normal. RN aware.    Follow Up Recommendations  Home health PT;Supervision/Assistance - 24 hour     Equipment Recommendations  Rolling walker with 5" wheels    Recommendations for Other Services       Precautions / Restrictions Precautions Precautions: Fall Precaution Comments: mild confusion Restrictions Weight Bearing Restrictions: No    Mobility  Bed Mobility Overal bed mobility: Needs Assistance Bed Mobility: Supine to Sit     Supine to sit: Min assist     General bed mobility comments: hob elevated, minA for trunk elevation and directional verbal cues  Transfers Overall transfer level: Needs assistance Equipment used: Rolling walker (2 wheeled) Transfers: Sit to/from Stand Sit to Stand: Min guard         General transfer comment: cues for hand placement and min guard for safety  Ambulation/Gait Ambulation/Gait assistance: Min assist Ambulation Distance (Feet): 120 Feet Assistive device: Rolling walker (2 wheeled) Gait Pattern/deviations: Step-through pattern Gait velocity: dec Gait velocity interpretation: Below normal speed for age/gender General Gait Details: max directional v/c's to complete unit circle, did not initiate turning right at any of the 4 opportunities   Stairs             Wheelchair Mobility    Modified Rankin (Stroke Patients Only)       Balance Overall balance assessment: Needs assistance Sitting-balance support: No upper extremity supported Sitting balance-Leahy Scale: Good     Standing balance support: Single extremity supported Standing balance-Leahy Scale: Fair                      Cognition Arousal/Alertness: Awake/alert Behavior During Therapy: WFL for tasks assessed/performed Overall Cognitive Status: Impaired/Different from baseline Area of Impairment: Orientation;Safety/judgement;Awareness;Problem solving Orientation Level: Disoriented to;Situation (able to state date and place but confused about situation)       Safety/Judgement: Decreased awareness of safety;Decreased awareness of deficits Awareness: Emergent (reports "i need to use the bathroom") Problem Solving: Slow processing;Requires verbal cues General Comments: pt perseverating on "did they fix that machine" "Did i do okay on that machine" in reference to cpap trial at night. per RN pt became confused and wouldn't comply with machine and kept taking it off    Exercises      General Comments        Pertinent Vitals/Pain Pain Assessment: No/denies pain    Home Living                      Prior Function            PT Goals (current goals can now be found in the care plan section) Acute Rehab PT Goals Patient Stated Goal: to get my breathing right Progress towards PT goals: Progressing toward goals  Frequency    Min 3X/week      PT Plan Current plan remains appropriate    Co-evaluation             End of Session Equipment Utilized During Treatment: Gait belt Activity Tolerance: Patient tolerated treatment well;Patient limited by fatigue Patient left: in chair;with call bell/phone within reach     Time: 0825-0853 PT Time Calculation (min) (ACUTE ONLY): 28 min  Charges:  $Gait Training: 8-22  mins $Therapeutic Activity: 8-22 mins                    G Codes:      Aveena Bari M Willi Borowiak Dec 20, 2016, 9:17 AM  Kittie Plater, PT, DPT Pager #: [phone] Office #: [phone]

## 2016-11-23 NOTE — Progress Notes (Signed)
Pt desynchronize w/ bipap machine. RT assessed and optimized settings. Pt still desynchronize. Pt removed bipap and RN changed to [place] 2L o2. Pts o2 sats mid 90s. Nursing will continue to monitor.

## 2016-11-23 NOTE — Progress Notes (Signed)
RT tightening mask. There was an adequate leak preventing good ventilation. Pt tolerating it fairly well. Pt encourage to use BiPAP. Observe patient shallow breathing pattern. Settings adjusted to patient comfort.

## 2016-11-23 NOTE — Progress Notes (Signed)
Pt didn't tolerate NIV well. Pt alarm keeps ringing decrease minute ventilation, pt was encourage to take slow deep breaths and patient stated that she is trying. RT attempted to increase PC/IPAP setting to help aid better minute ventilation/volumes. Pt doesn't tolerate it well stating that "its to much" its too much". Apparently the pressure is to much. Pressure adjust to her comfort still unable to tolerate it. NIV mask removed per RN and [place] placed back on patient. Pt is a shallow mouth breather while sleeping. RN aware

## 2016-11-23 NOTE — Progress Notes (Signed)
[place] TEAM 1 - Stepdown/ICU TEAM  LAVEYAH PINKNEY  [id number] DOB: [birth year] DOA: 11/20/2016 PCP: [name], MD    Brief Narrative:   82 y.o. female transferred to the intensive care unit 2/17 with altered mentation and acute hypercarbic respiratory failure. Known coronary artery disease, chronic renal failure, atrial fibrillation, hypertension, diabetes mellitus, and congestive heart failure. Presented with 2-3 weeks of progressive shortness of breath after a previous fall with rib fractures. Patient awakened in the morning with confusion and difficulty with deep inspiration. Upon arrival in the ICU patient was awake, alert, and oriented in no respiratory distress. Patient was hyperoxygenated with nasal cannula oxygen.  Significant Events: 02/16 - Admit 02/17 - Transfer to ICU with hypercarbia and altered mentation  Subjective: Pt reports posterior chest pain related to her rib fxs.  She denise sob, n/v, or abdom pain.  She is alert and oriented.  She had significant difficulty synchronizing w/ BIPAP last night, and ultimately it had to be removed.   Assessment & Plan:  Acute encephalopathy likely secondary to hypercarbia but must also consider hypoglycemia - avoid narcotics - follow w/ attempts to find best use of CPAP/BIPAP QHS   Acute on chronic hypercarbic respiratory failure possible hypoventilation due to rib fxs versus occult sleep apnea - continue BiPAP as needed and at night while sleeping as able, but pt was not able to tolerate it last night   Hypoglycemia in DM2 Now off D5 infusion - hold home glyburide - CBG stabilizing - A1c 8.5 - follow closely   Community acquired pneumonia On empiric rocephin & azithromycin  Hyperkalemia Likely secondary to acidosis - resolved  Acute on chronic kidney disease stage 2 Has returned to baseline - baseline crt appears to be ~1.2  Recent Labs Lab 11/20/16 1358 11/21/16 0812 11/21/16 2021 11/22/16 0205 11/23/16 0154    CREATININE 1.51* 1.77* 1.94* 1.80* 1.21*    Hypertension BP well controlled at this time  Parox Atrial fibrillation Currently in NSR - on sotalol chronically (followed by Dr. Sallyanne Kuster) - not on anticoag due to hx of spontaneous RPH - CHADSVasc 7  Hyperlipidemia Cont Lipitor per home dose   CAD s/p CABG x4 2003 Denies chest pain   DVT prophylaxis: lovenox  Code Status: FULL CODE Family Communication: no family present at time of exam  Disposition Plan: transfer to SDU   Consultants:  PCCM  Antimicrobials:  Rocephin 2/16 > Azithromycin 2/16 >  Objective: Blood pressure (!) 127/55, pulse 61, temperature 98.3 F (36.8 C), temperature source Oral, resp. rate (!) 26, height 5\' 3"  (1.6 m), weight 62 kg (136 lb 11.2 oz), SpO2 99 %.  Intake/Output Summary (Last 24 hours) at 11/23/16 0850 Last data filed at 11/23/16 0800  Gross per 24 hour  Intake             1170 ml  Output             1502 ml  Net             -332 ml   Filed Weights   11/20/16 1754 11/21/16 0500 11/23/16 0500  Weight: 58.1 kg (128 lb 1.4 oz) 61.1 kg (134 lb 11.2 oz) 62 kg (136 lb 11.2 oz)    Examination: General: No acute respiratory distress Lungs: Clear to auscultation bilaterally without wheezes or crackles Cardiovascular: Regular rate and rhythm without murmur gallop or rub normal S1 and S2 Abdomen: Nontender, nondistended, soft, bowel sounds positive, no rebound, no ascites, no appreciable mass Extremities:  No significant cyanosis, clubbing, or edema bilateral lower extremities  CBC:  Recent Labs Lab 11/20/16 1358 11/21/16 0812 11/22/16 0205 11/23/16 0154  WBC 9.7 9.9 10.2 10.4  NEUTROABS 5.6  --   --  4.9  HGB 12.9 12.5 11.4* 11.7*  HCT 41.6 41.7 38.9 39.8  MCV 93.7 97.4 97.3 97.5  PLT 246 231 182 [id number]   Basic Metabolic Panel:  Recent Labs Lab 11/20/16 1358 11/21/16 0812 11/21/16 2021 11/22/16 0205 11/23/16 0154  NA 137 139 139 141 140  K 5.3* 5.4* 4.4 4.4 4.5  CL 97* 100*  97* 101 100*  CO2 29 35* 32 32 31  GLUCOSE 191* 86 125* 108* 181*  BUN 46* 52* 55* 53* 29*  CREATININE 1.51* 1.77* 1.94* 1.80* 1.21*  CALCIUM 9.1 8.5* 8.4* 8.2* 8.5*  MG  --   --   --   --  1.8  PHOS  --   --   --   --  3.3   GFR: Estimated Creatinine Clearance: 31.2 mL/min (by C-G formula based on SCr of 1.21 mg/dL (H)).  Liver Function Tests:  Recent Labs Lab 11/23/16 0154  ALBUMIN 2.9*    HbA1C: Hgb A1c MFr Bld  Date/Time Value Ref Range Status  11/20/2016 01:58 PM 8.5 (H) 4.8 - 5.6 % Final    Comment:    (NOTE)         Pre-diabetes: 5.7 - 6.4         Diabetes: >6.4         Glycemic control for adults with diabetes: <7.0   09/11/2016 08:46 AM 8.3 (H) 4.6 - 6.5 % Final    Comment:    Glycemic Control Guidelines for People with Diabetes:Non Diabetic:  <6%Goal of Therapy: <7%Additional Action Suggested:  >8%     CBG:  Recent Labs Lab 11/22/16 2017 11/22/16 2154 11/23/16 0016 11/23/16 0424 11/23/16 0724  GLUCAP 305* 188* 188* 144* 143*    Recent Results (from the past 240 hour(s))  Blood culture (routine x 2)     Status: None (Preliminary result)   Collection Time: 11/20/16  1:50 PM  Result Value Ref Range Status   Specimen Description BLOOD BLOOD LEFT WRIST  Final   Special Requests IN PEDIATRIC BOTTLE 3CC  Final   Culture NO GROWTH 2 DAYS  Final   Report Status PENDING  Incomplete  Blood culture (routine x 2)     Status: None (Preliminary result)   Collection Time: 11/20/16  1:58 PM  Result Value Ref Range Status   Specimen Description BLOOD BLOOD LEFT FOREARM  Final   Special Requests IN PEDIATRIC BOTTLE 3CC  Final   Culture NO GROWTH 2 DAYS  Final   Report Status PENDING  Incomplete  MRSA PCR Screening     Status: None   Collection Time: 11/21/16  6:20 PM  Result Value Ref Range Status   MRSA by PCR NEGATIVE NEGATIVE Final    Comment:        The GeneXpert MRSA Assay (FDA approved for NASAL specimens only), is one component of a comprehensive  MRSA colonization surveillance program. It is not intended to diagnose MRSA infection nor to guide or monitor treatment for MRSA infections.      Scheduled Meds: . albuterol  2.5 mg Nebulization BID  . aspirin EC  81 mg Oral Daily  . atorvastatin  20 mg Oral QHS  . azithromycin  500 mg Oral Q24H  . cefTRIAXone (ROCEPHIN)  IV  1 g Intravenous  Q24H  . cholecalciferol  1,000 Units Oral BID  . enoxaparin (LOVENOX) injection  30 mg Subcutaneous Q24H  . guaiFENesin  600 mg Oral BID  . mouth rinse  15 mL Mouth Rinse BID  . sotalol  40 mg Oral BID   Continuous Infusions: . sodium chloride    . dextrose 5 % and 0.45% NaCl 50 mL/hr at 11/23/16 0130     LOS: 2 days   Cherene Altes, MD [facility] Office  [phone] Pager - Text Page per Amion as per below:  On-Call/Text Page:      [email or web address]      password [email or web address]  If 7PM-7AM, please contact night-coverage [email or web address] Password [email or web address] 11/23/2016, 8:50 AM

## 2016-11-24 LAB — COMPREHENSIVE METABOLIC PANEL
ALK PHOS: 65 U/L (ref 38–126)
ALT: 73 U/L — AB (ref 14–54)
ANION GAP: 5 (ref 5–15)
AST: 32 U/L (ref 15–41)
Albumin: 2.9 g/dL — ABNORMAL LOW (ref 3.5–5.0)
BUN: 15 mg/dL (ref 6–20)
CALCIUM: 9 mg/dL (ref 8.9–10.3)
CO2: 36 mmol/L — ABNORMAL HIGH (ref 22–32)
CREATININE: 0.86 mg/dL (ref 0.44–1.00)
Chloride: 102 mmol/L (ref 101–111)
GFR calc non Af Amer: >60 mL/min (ref >60)
Glucose, Bld: 108 mg/dL — ABNORMAL HIGH (ref 65–99)
Potassium: 4.2 mmol/L (ref 3.5–5.1)
Sodium: 143 mmol/L (ref 135–145)
TOTAL PROTEIN: 5.4 g/dL — AB (ref 6.5–8.1)
Total Bilirubin: 0.7 mg/dL (ref 0.3–1.2)

## 2016-11-24 LAB — CBC
HCT: 41.3 % (ref 36.0–46.0)
HEMOGLOBIN: 12.3 g/dL (ref 12.0–15.0)
MCH: 29.1 pg (ref 26.0–34.0)
MCHC: 29.8 g/dL — ABNORMAL LOW (ref 30.0–36.0)
MCV: 97.9 fL (ref 78.0–100.0)
Platelets: 172 10*3/uL (ref 150–400)
RBC: 4.22 MIL/uL (ref 3.87–5.11)
RDW: 16.4 % — ABNORMAL HIGH (ref 11.5–15.5)
WBC: 9.2 10*3/uL (ref 4.0–10.5)

## 2016-11-24 LAB — GLUCOSE, CAPILLARY
GLUCOSE-CAPILLARY: 206 mg/dL — AB (ref 65–99)
Glucose-Capillary: 75 mg/dL (ref 65–99)
Glucose-Capillary: 254 mg/dL — ABNORMAL HIGH (ref 65–99)

## 2016-11-24 MED ORDER — ENOXAPARIN SODIUM 40 MG/0.4ML ~~LOC~~ SOLN
40.0000 mg | SUBCUTANEOUS | Status: DC
  Administered 2016-11-24: 40 mg via SUBCUTANEOUS
  Filled 2016-11-24: qty 0.4

## 2016-11-24 NOTE — Progress Notes (Signed)
[facility] Progress Note  Patient: [name] [id number]   PCP: [name], MD DOB: 1935/09/06   DOA: 11/20/2016   DOS: 11/24/2016   Date of Service: the patient was seen and examined on 11/24/2016   Subjective: Feeling better, no chest pain, no shortnes of breath, cough still present. Drops to 85% on ambulation with 2 L of oxygen.  Brief hospital course: Pt. with PMH of CAD, A. fib, HTN, type II DM, CKD stage II ; admitted on 11/20/2016, with complaint of shortness of breath, was found to have Acute hypercarbic respiratory failure with acute encephalopathy. Was initially admitted on the floor and then transferred to ICU. Currently stable at rest on 2 L that requires more oxygen on ambulation Currently further plan is continue to wean oxygen.  Assessment and Plan: 1. Acute respiratory failure with hypoxia (HCC)  Community-acquired pneumonia On 2 L at rest, drops to 85% on ambulation. No chest pain has dry cough. Unable to tolerate C Pap BiPAP at present. We'll use incentive spirometry. Will need outpatient sleep study, follow-up with pulmonary as an outpatient.  2. Acute encephalopathy. Secondary to hypercarbia, possibility of hypoglycemic and it ruled out. Avoid narcotic pain medications. Unable to tolerate an IV in the hospital, will need sleep study as an outpatient. Probably will require oxygen on discharge.  3. Type 2 diabetes mellitus. Hypoglycemic episode. Patient was initially on D5 drip due to hypoglycemia. Hemoglobin A1c 8.5. Currently on sensitive sliding scale blood sugars well controlled. Will monitor.  4 community-acquired pneumonia. On Rocephin and Zithromax. Can be transitioned to oral antibiotics tomorrow if remains stable.  5. Acute on chronic kidney disease stage II. Appears to be baseline. Continue close monitoring and avoid nephrotoxic medication.  6. A. Fib. CHADSVasc 7 On Betapace  not on anticoagulation due to history of spontaneous  retroperitoneal hemorrhage. Currently in sinus rhythm. Monitor.  7. Coronary artery disease S/P CABG 2003. Dyslipidemia. Continue statin. Continue to monitor.  Bowel regimen: last BM 11/24/2016 Diet: Cardiac diet DVT Prophylaxis: subcutaneous Heparin  Advance goals of care discussion: Full code  Family Communication: no family was present at bedside, at the time of interview. The pt provided permission to discuss medical plan with the family. I discussed with patient's son on the phone Opportunity was given to ask question and all questions were answered satisfactorily.   Disposition:  Discharge to home. Expected discharge date: 11/25/2016, improvement in oxygenation  Consultants: Primary admission with CCM Procedures: NIV  Antibiotics: Anti-infectives    Start     Dose/Rate Route Frequency Ordered Stop   11/20/16 1600  cefTRIAXone (ROCEPHIN) 1 g in dextrose 5 % 50 mL IVPB     1 g 100 mL/hr over 30 Minutes Intravenous Every 24 hours 11/20/16 1532 11/27/16 1559   11/20/16 1545  azithromycin (ZITHROMAX) tablet 500 mg     500 mg Oral Every 24 hours 11/20/16 1532 11/27/16 1529   11/20/16 1415  cefTRIAXone (ROCEPHIN) 1 g in dextrose 5 % 50 mL IVPB  Status:  Discontinued     1 g 100 mL/hr over 30 Minutes Intravenous  Once 11/20/16 1404 11/20/16 1554   11/20/16 1415  azithromycin (ZITHROMAX) 500 mg in dextrose 5 % 250 mL IVPB  Status:  Discontinued     500 mg 250 mL/hr over 60 Minutes Intravenous  Once 11/20/16 1404 11/20/16 1555        Objective: Physical Exam: Vitals:   11/24/16 0500 11/24/16 0600 11/24/16 0831 11/24/16 1124  BP:   Marland Kitchen)  129/58 (!) 124/55  Pulse: 68 70 74 67  Resp: (!) 0 14 18 18   Temp:   99.4 F (37.4 C) 98.7 F (37.1 C)  TempSrc:   Oral Oral  SpO2: (!) 89% 99% 100% 100%  Weight:    60.1 kg (132 lb 6.4 oz)  Height:    5\' 4"  (1.626 m)    Intake/Output Summary (Last 24 hours) at 11/24/16 1216 Last data filed at 11/24/16 0900  Gross per 24 hour    Intake               50 ml  Output              950 ml  Net             -900 ml   Filed Weights   11/23/16 0500 11/24/16 0328 11/24/16 1124  Weight: 62 kg (136 lb 11.2 oz) 63.1 kg (139 lb 1.8 oz) 60.1 kg (132 lb 6.4 oz)    General: Alert, Awake and Oriented to Time, Place and Person. Appear in mild distress, affect appropriate Eyes: PERRL, Conjunctiva normal ENT: Oral Mucosa clear moist. Neck: no JVD, no Abnormal Mass Or lumps Cardiovascular: S1 and S2 Present, no Murmur, Respiratory: Bilateral Air entry equal and Decreased, no use of accessory muscle, basal Crackles, no wheezes Abdomen: Bowel Sound present, Soft and no tenderness Skin: no redness, no Rash, no induration Extremities: no Pedal edema, no calf tenderness Neurologic: Grossly no focal neuro deficit. Bilaterally Equal motor strength  Data Reviewed: CBC:  Recent Labs Lab 11/20/16 1358 11/21/16 0812 11/22/16 0205 11/23/16 0154 11/24/16 0313  WBC 9.7 9.9 10.2 10.4 9.2  NEUTROABS 5.6  --   --  4.9  --   HGB 12.9 12.5 11.4* 11.7* 12.3  HCT 41.6 41.7 38.9 39.8 41.3  MCV 93.7 97.4 97.3 97.5 97.9  PLT 246 231 182 190 [id number]   Basic Metabolic Panel:  Recent Labs Lab 11/21/16 0812 11/21/16 2021 11/22/16 0205 11/23/16 0154 11/24/16 0313  NA 139 139 141 140 143  K 5.4* 4.4 4.4 4.5 4.2  CL 100* 97* 101 100* 102  CO2 35* 32 32 31 36*  GLUCOSE 86 125* 108* 181* 108*  BUN 52* 55* 53* 29* 15  CREATININE 1.77* 1.94* 1.80* 1.21* 0.86  CALCIUM 8.5* 8.4* 8.2* 8.5* 9.0  MG  --   --   --  1.8  --   PHOS  --   --   --  3.3  --     Liver Function Tests:  Recent Labs Lab 11/23/16 0154 11/24/16 0313  AST  --  32  ALT  --  73*  ALKPHOS  --  65  BILITOT  --  0.7  PROT  --  5.4*  ALBUMIN 2.9* 2.9*   No results for input(s): LIPASE, AMYLASE in the last 168 hours. No results for input(s): AMMONIA in the last 168 hours. Coagulation Profile: No results for input(s): INR, PROTIME in the last 168 hours. Cardiac  Enzymes: No results for input(s): CKTOTAL, CKMB, CKMBINDEX, TROPONINI in the last 168 hours. BNP (last 3 results) No results for input(s): PROBNP in the last 8760 hours.  CBG:  Recent Labs Lab 11/23/16 0724 11/23/16 1135 11/23/16 1526 11/23/16 2130 11/24/16 0858  GLUCAP 143* 175* 168* 241* 75    Studies: No results found.   Scheduled Meds: . albuterol  2.5 mg Nebulization BID  . aspirin EC  81 mg Oral Daily  . atorvastatin  20 mg  Oral QHS  . azithromycin  500 mg Oral Q24H  . cefTRIAXone (ROCEPHIN)  IV  1 g Intravenous Q24H  . cholecalciferol  1,000 Units Oral BID  . enoxaparin (LOVENOX) injection  30 mg Subcutaneous Q24H  . guaiFENesin  600 mg Oral BID  . insulin aspart  0-9 Units Subcutaneous TID WC  . mouth rinse  15 mL Mouth Rinse BID  . sotalol  40 mg Oral BID   Continuous Infusions: PRN Meds: acetaminophen **OR** acetaminophen, albuterol, ondansetron **OR** ondansetron (ZOFRAN) IV  Time spent: 30 minutes  Author: Berle Mull, MD [facility] Pager: [phone] 11/24/2016 12:16 PM  If 7PM-7AM, please contact night-coverage at [email or web address], password [facility]

## 2016-11-24 NOTE — Progress Notes (Signed)
Patient ambulated entire hallway with cane and standby assist.

## 2016-11-24 NOTE — Progress Notes (Signed)
Patient being transferred to [facility]. Report called to receiving nurse Lauren RN. All questions answered.

## 2016-11-24 NOTE — Progress Notes (Signed)
Occupational Therapy Treatment Patient Details Name: [name] MRN: [id number] DOB: Aug 20, 1935 Today's Date: 11/24/2016    History of present illness Pt is a 82 yo female admitted on 11/20/16 through ED following increased dyspnea and coughing with O2 sats in the 60s. Pt was diagnosed with ARF and CAP. PMH significant for CAD, CKD, A-Fib, HTN, DM, Breast CA right.    OT comments  Pt performing toileting and grooming with min guard assist on 2L 02 with VS monitored throughout. Pt alert and oriented and recalled OT's visit from yesterday. SpO2 down to 85% on 2L, but rebounded quickly to low 90s with seated rest break.   Follow Up Recommendations  Home health OT;Supervision/Assistance - 24 hour    Equipment Recommendations  None recommended by OT    Recommendations for Other Services      Precautions / Restrictions Precautions Precautions: Fall Restrictions Weight Bearing Restrictions: No       Mobility Bed Mobility                  Transfers Overall transfer level: Needs assistance Equipment used: 1 person hand held assist Transfers: Sit to/from Stand Sit to Stand: Min guard         General transfer comment: min guard for safety    Balance     Sitting balance-Leahy Scale: Good       Standing balance-Leahy Scale: Fair                     ADL Overall ADL's : Needs assistance/impaired Eating/Feeding: Set up;Sitting   Grooming: Wash/dry hands;Wash/dry face;Standing;Min guard                   Armed forces technical officer: Runner, broadcasting/film/video and Hygiene: Min guard;Sit to/from stand       Functional mobility during ADLs: Min guard (walked around bed with min guard assist, stabilizing on furn) General ADL Comments: Pt with 02 sats down to 85% on 2L with good wave form with activity. Returned to 93% in chair with rest.      Vision                     Perception     Praxis      Cognition    Behavior During Therapy: [facility] for tasks assessed/performed Overall Cognitive Status: Within Functional Limits for tasks assessed                         Exercises     Shoulder Instructions       General Comments      Pertinent Vitals/ Pain       Pain Assessment: No/denies pain  Home Living                                          Prior Functioning/Environment              Frequency  Min 2X/week        Progress Toward Goals  OT Goals(current goals can now be found in the care plan section)  Progress towards OT goals: Progressing toward goals  Acute Rehab OT Goals Patient Stated Goal: to get my breathing right Time For Goal Achievement: 12/07/16 Potential to Achieve Goals: Good  Plan Discharge plan remains appropriate    Co-evaluation  End of Session Equipment Utilized During Treatment: Oxygen (2L)  OT Visit Diagnosis: Unsteadiness on feet (R26.81) (decreased activity tolerance)   Activity Tolerance Patient tolerated treatment well   Patient Left in chair;with call bell/phone within reach;with nursing/sitter in room   Nurse Communication Mobility status        Time: [id number] OT Time Calculation (min): 17 min  Charges: OT General Charges $OT Visit: 1 Procedure OT Treatments $Self Care/Home Management : 8-22 mins   Malka So 11/24/2016, 9:09 AM  [phone]

## 2016-11-24 NOTE — Progress Notes (Signed)
Physical Therapy Treatment Patient Details Name: [name] MRN: [id number] DOB: 18-Apr-1935 Today's Date: 11/24/2016    History of Present Illness Pt is a 82 yo female admitted on 11/20/16 through ED following increased dyspnea and coughing with O2 sats in the 60s. Pt was diagnosed with ARF and CAP. PMH significant for CAD, CKD, A-Fib, HTN, DM, Breast CA right.     PT Comments    Pt improving well with gait stability though still unable to keep sats up to adequate levels with mobility unless using 3 L [place].    Follow Up Recommendations  Home health PT;Supervision/Assistance - 24 hour     Equipment Recommendations  Rolling walker with 5" wheels    Recommendations for Other Services       Precautions / Restrictions Precautions Precautions: Fall (minimal)    Mobility  Bed Mobility               General bed mobility comments: up in the chair  Transfers Overall transfer level: Needs assistance Equipment used: Straight cane Transfers: Sit to/from Stand Sit to Stand: Min guard         General transfer comment: min guard for safety  Ambulation/Gait Ambulation/Gait assistance: Min guard Ambulation Distance (Feet): 250 Feet Assistive device: Straight cane Gait Pattern/deviations: Step-through pattern Gait velocity: dec Gait velocity interpretation: Below normal speed for age/gender General Gait Details: slow steady gait with SPC.  Can scan without deviation, but still not yet confident to scan extensively without being cued to do so.   Stairs            Wheelchair Mobility    Modified Rankin (Stroke Patients Only)       Balance Overall balance assessment: Needs assistance Sitting-balance support: No upper extremity supported Sitting balance-Leahy Scale: Good     Standing balance support: Single extremity supported Standing balance-Leahy Scale: Fair                      Cognition Arousal/Alertness: Awake/alert Behavior During  Therapy: WFL for tasks assessed/performed Overall Cognitive Status: Within Functional Limits for tasks assessed                      Exercises      General Comments General comments (skin integrity, edema, etc.): On 2L [place] during gait sats dropped to between 83-88% at 84 bpm with quick recovery to 90% in standing.  On 3 L with gait, pt able to maintain 91% at 80 bpm      Pertinent Vitals/Pain Pain Assessment: No/denies pain    Home Living Family/patient expects to be discharged to:: Private residence Living Arrangements: Alone                  Prior Function            PT Goals (current goals can now be found in the care plan section) Acute Rehab PT Goals Patient Stated Goal: to get my breathing right PT Goal Formulation: With patient Time For Goal Achievement: 11/28/16 Potential to Achieve Goals: Good Progress towards PT goals: Progressing toward goals    Frequency    Min 3X/week      PT Plan Current plan remains appropriate    Co-evaluation             End of Session Equipment Utilized During Treatment: Oxygen Activity Tolerance: Patient tolerated treatment well Patient left: in chair;with call bell/phone within reach;with family/visitor present Nurse Communication: Mobility status PT  Visit Diagnosis: Unsteadiness on feet (R26.81)     Time: [id number] PT Time Calculation (min) (ACUTE ONLY): 40 min  Charges:  $Gait Training: 8-22 mins $Therapeutic Activity: 23-37 mins                    G CodesTessie Fass Kiwan [place] 11/24/2016, 3:50 PM 11/24/2016  Donnella Sham, PT [phone]  (pager)

## 2016-11-25 LAB — CULTURE, BLOOD (ROUTINE X 2)
Culture: NO GROWTH
Culture: NO GROWTH

## 2016-11-25 LAB — GLUCOSE, CAPILLARY
GLUCOSE-CAPILLARY: 206 mg/dL — AB (ref 65–99)
Glucose-Capillary: 98 mg/dL (ref 65–99)

## 2016-11-25 MED ORDER — LEVOFLOXACIN 500 MG PO TABS: 500.0000 mg | ORAL_TABLET | Freq: Every day | ORAL | 0 refills | Status: DC

## 2016-11-25 MED ORDER — CEFDINIR 300 MG PO CAPS: 300.0000 mg | ORAL_CAPSULE | Freq: Two times a day (BID) | ORAL | 0 refills | Status: DC

## 2016-11-25 NOTE — Progress Notes (Signed)
Patient is arranged with [facility] for [facility] services; awaiting for qualifying 02 sats for home oxygen; B Pennie Rushing [phone]

## 2016-11-25 NOTE — Progress Notes (Signed)
SATURATION QUALIFICATIONS: (This note is used to comply with regulatory documentation for home oxygen)  Patient Saturations on Room Air at Rest = 90%  Patient Saturations on Room Air while Ambulating = 78%  Patient Saturations on 2 Liters of oxygen while Ambulating = 93%  Please briefly explain why patient needs home oxygen:

## 2016-11-25 NOTE — Discharge Summary (Signed)
Physician Discharge Summary  [name] [id number] DOB: 1935-06-27 DOA: 11/20/2016  PCP: [name], MD  Admit date: 11/20/2016 Discharge date: 11/25/2016  Time spent: 45 minutes  Recommendations for Outpatient Follow-up:  1. PCP Dr.Duncan in 1 week 2. Please repeat CXR in 4-6weeks   Discharge Diagnoses:  Principal Problem:   Acute respiratory failure with hypoxia ([place])   Community acquired pneumonia   Iron deficiency anemia   Essential hypertension   Atrial fibrillation (HCC)   Chronic kidney disease, stage III (moderate)   Coronary artery disease   Chronic combined systolic and diastolic congestive heart failure [facility])   Discharge Condition: stable  Diet recommendation: heart healthy  Filed Weights   11/24/16 0328 11/24/16 1124 11/25/16 0510  Weight: 63.1 kg (139 lb 1.8 oz) 60.1 kg (132 lb 6.4 oz) 59.6 kg (131 lb 6.4 oz)    History of present illness:  Pt. with PMH of CAD, A. fib, HTN, type II DM, CKD stage II ; admitted on 11/20/2016, with complaint of shortness of breath, was found to have Acute hypercarbic respiratory failure with acute encephalopathy.   Hospital Course:  1. Acute respiratory failure with hypoxia (HCC)  -due to Community-acquired pneumonia -improved with Abx, changed to PO Cefdinir at discharge  2. Acute encephalopathy. -Secondary to hypercarbia, hypoglycemia, meds, infection -resolved  3. Type 2 diabetes mellitus. -was hypoglycemic on admission due to infection, poor Po intake -stable, resumed home regimen at discharge -if she has future episodes of hypoglycemia, consider stopping GLyburide  4  Acute on chronic kidney disease stage II. -improved with hydration and normalized at the time of discharge  5. A. Fib. CHADSVasc 7 -continue on Betapace, not on anticoagulation due to history of spontaneous retroperitoneal hemorrhage. -in NSR now  6. Coronary artery disease S/P CABG 2003. -stable   Discharge Exam: Vitals:   11/25/16  0749 11/25/16 0900  BP: 132/62 135/60  Pulse: 68 80  Resp: 16   Temp: 97.9 F (36.6 C) 98.8 F (37.1 C)    General: AAOx3 Cardiovascular: S1S2/RRR Respiratory: CTAB  Discharge Instructions   Discharge Instructions    Diet - low sodium heart healthy    Complete by:  As directed    Diet Carb Modified    Complete by:  As directed    Increase activity slowly    Complete by:  As directed      Discharge Medication List as of 11/25/2016 12:12 PM    START taking these medications   Details  cefdinir (OMNICEF) 300 MG capsule Take 1 capsule (300 mg total) by mouth 2 (two) times daily. For 3days, Starting Wed 11/25/2016, Normal      CONTINUE these medications which have NOT CHANGED   Details  amLODipine (NORVASC) 10 MG tablet Take 10 mg by mouth daily., Historical Med    aspirin EC 81 MG tablet Take 81 mg by mouth daily., Historical Med    atorvastatin (LIPITOR) 20 MG tablet TAKE 1 TABLET BY MOUTH AT BEDTIME, Normal    benazepril (LOTENSIN) 20 MG tablet Take 1 tablet (20 mg total) by mouth daily., Starting Fri 11/13/2016, Normal    Cholecalciferol (VITAMIN D) 1000 UNITS capsule Take 1,000 Units by mouth 2 (two) times daily.  , Historical Med    glyBURIDE (DIABETA) 5 MG tablet TAKE 2 TABLETS (10 MG TOTAL) BY MOUTH DAILY WITH BREAKFAST., Normal    insulin aspart (NOVOLOG) 100 UNIT/ML injection USE PER SLIDING SCALE--200-250=7 UNITS, 251-300=8 UNITS,301-350=9 UNITS, WITH MEALS, No Print    Insulin  Syringe-Needle U-100 (BD INSULIN SYRINGE ULTRAFINE) 31G X 5/16" 0.3 ML MISC USE DAILY AND AS DIRECTED SLIDING SCALE.  Multiple injections of insulin per day, Normal    meclizine (ANTIVERT) 25 MG tablet Take 1 tablet (25 mg total) by mouth daily as needed. For dizziness, Starting Thu 01/03/2015, Normal    nitroGLYCERIN (NITROSTAT) 0.4 MG SL tablet Place 1 tablet (0.4 mg total) under the tongue as needed for chest pain. As directed, Starting Thu 12/12/2015, Normal    nystatin  (MYCOSTATIN/NYSTOP) 100000 UNIT/GM POWD Apply up to 3 times a day if needed., Normal    Polyvinyl Alcohol-Povidone (REFRESH OP) Place 1 drop into both eyes daily as needed., Historical Med    RANEXA 500 MG 12 hr tablet TAKE 1 TABLET (500 MG TOTAL) BY MOUTH 2 (TWO) TIMES DAILY. NEED APPOINTMENT FOR MORE REFILLS, Normal    SOTALOL AF 80 MG TABS Take 0.5 tablets (40 mg total) by mouth 2 (two) times daily., Starting Thu 03/12/2016, Normal    ONE TOUCH ULTRA TEST test strip USE TO CHECK BLOOD SUGAR 4 TIMES A DAY. INSULIN-DEPENDENT. DIAGNOSIS: E11.9, Normal       Allergies  Allergen Reactions  . Actos [Pioglitazone] Other (See Comments)    edema  . Metformin And Related Other (See Comments)    Held due to renal function  . Zocor [Simvastatin] Other (See Comments)    Myalgias   Follow-up Information    [facility].   Why:  They will do your home health care at your home Contact information: [address] [place] [address] [phone]            The results of significant diagnostics from this hospitalization (including imaging, microbiology, ancillary and laboratory) are listed below for reference.    Significant Diagnostic Studies: Dg Chest 2 View  Result Date: 11/20/2016 CLINICAL DATA:  Shortness of breath, cough EXAM: CHEST  2 VIEW COMPARISON:  07/14/2016 FINDINGS: Cardiomegaly again noted. Status post CABG. No convincing pulmonary edema. There is hazy airspace disease in left lower lobe retrocardiac best seen on lateral view highly suspicious for pneumonia. Osteopenia and degenerative changes thoracic spine. Old fractures of the left fourth fifth and sixth ribs. IMPRESSION: Cardiomegaly. Status post CABG. Hazy infiltrate/pneumonia in left lower lobe retrocardiac best seen on lateral view. No convincing pulmonary edema. Old fractures of the left fourth fifth and sixth rib. Electronically Signed   By: Lahoma Crocker M.D.   On: 11/20/2016 13:25    Dg Chest Port 1v Same Day  Result Date: 11/21/2016 CLINICAL DATA:  Follow-up pneumonia.  Subsequent encounter. EXAM: PORTABLE CHEST 1 VIEW COMPARISON:  Chest radiograph from 11/20/2016 FINDINGS: The lungs are hypoexpanded. Mildly worsened left-sided airspace opacity is concerning for worsening pneumonia. Minimal right basilar opacity is also seen. No pleural effusion or pneumothorax is seen. The lung apices are partially obscured by the patient's head. The cardiomediastinal silhouette is mildly enlarged. The patient is status post median sternotomy, with evidence of prior CABG. No acute osseous abnormalities are seen. Chronic left-sided rib deformities are noted. Mild sclerosis and joint space narrowing is noted at the right glenohumeral joint. IMPRESSION: 1. Mildly worsened left-sided pneumonia noted. Minimal right basilar opacity also seen. 2. Lungs hypoexpanded. 3. Mild cardiomegaly. Electronically Signed   By: Garald Balding M.D.   On: 11/21/2016 18:41    Microbiology: Recent Results (from the past 240 hour(s))  Blood culture (routine x 2)     Status: None   Collection Time:  11/20/16  1:50 PM  Result Value Ref Range Status   Specimen Description BLOOD BLOOD LEFT WRIST  Final   Special Requests IN PEDIATRIC BOTTLE 3CC  Final   Culture NO GROWTH 5 DAYS  Final   Report Status 11/25/2016 FINAL  Final  Blood culture (routine x 2)     Status: None   Collection Time: 11/20/16  1:58 PM  Result Value Ref Range Status   Specimen Description BLOOD BLOOD LEFT FOREARM  Final   Special Requests IN PEDIATRIC BOTTLE 3CC  Final   Culture NO GROWTH 5 DAYS  Final   Report Status 11/25/2016 FINAL  Final  MRSA PCR Screening     Status: None   Collection Time: 11/21/16  6:20 PM  Result Value Ref Range Status   MRSA by PCR NEGATIVE NEGATIVE Final    Comment:        The GeneXpert MRSA Assay (FDA approved for NASAL specimens only), is one component of a comprehensive MRSA colonization surveillance  program. It is not intended to diagnose MRSA infection nor to guide or monitor treatment for MRSA infections.   Urine culture     Status: Abnormal   Collection Time: 11/22/16  5:37 AM  Result Value Ref Range Status   Specimen Description URINE, RANDOM  Final   Special Requests NONE  Final   Culture <10,000 COLONIES/mL INSIGNIFICANT GROWTH (A)  Final   Report Status 11/23/2016 FINAL  Final     Labs: Basic Metabolic Panel:  Recent Labs Lab 11/21/16 0812 11/21/16 2021 11/22/16 0205 11/23/16 0154 11/24/16 0313  NA 139 139 141 140 143  K 5.4* 4.4 4.4 4.5 4.2  CL 100* 97* 101 100* 102  CO2 35* 32 32 31 36*  GLUCOSE 86 125* 108* 181* 108*  BUN 52* 55* 53* 29* 15  CREATININE 1.77* 1.94* 1.80* 1.21* 0.86  CALCIUM 8.5* 8.4* 8.2* 8.5* 9.0  MG  --   --   --  1.8  --   PHOS  --   --   --  3.3  --    Liver Function Tests:  Recent Labs Lab 11/23/16 0154 11/24/16 0313  AST  --  32  ALT  --  73*  ALKPHOS  --  65  BILITOT  --  0.7  PROT  --  5.4*  ALBUMIN 2.9* 2.9*   No results for input(s): LIPASE, AMYLASE in the last 168 hours. No results for input(s): AMMONIA in the last 168 hours. CBC:  Recent Labs Lab 11/20/16 1358 11/21/16 0812 11/22/16 0205 11/23/16 0154 11/24/16 0313  WBC 9.7 9.9 10.2 10.4 9.2  NEUTROABS 5.6  --   --  4.9  --   HGB 12.9 12.5 11.4* 11.7* 12.3  HCT 41.6 41.7 38.9 39.8 41.3  MCV 93.7 97.4 97.3 97.5 97.9  PLT 246 231 182 190 172   Cardiac Enzymes: No results for input(s): CKTOTAL, CKMB, CKMBINDEX, TROPONINI in the last 168 hours. BNP: BNP (last 3 results) No results for input(s): BNP in the last 8760 hours.  ProBNP (last 3 results) No results for input(s): PROBNP in the last 8760 hours.  CBG:  Recent Labs Lab 11/24/16 0858 11/24/16 1645 11/24/16 2052 11/25/16 0742 11/25/16 1152  GLUCAP 75 206* 254* 98 206*       SignedDomenic Polite MD.  [facility] 11/25/2016, 3:40 PM

## 2016-11-25 NOTE — Care Management Important Message (Signed)
Important Message  Patient Details  Name: [name] MRN: [id number] Date of Birth: 1935-01-06   Medicare Important Message Given:  Yes    Orbie Pyo 11/25/2016, 11:12 AM

## 2016-11-27 ENCOUNTER — Telehealth: Payer: Self-pay | Admitting: *Deleted

## 2016-11-27 NOTE — Telephone Encounter (Signed)
[name] [id number] DOB: [birth year] DOA: 11/20/2016  PCP: [name], MD  Admit date: 11/20/2016 Discharge date: 11/25/2016  Time spent: 45 minutes  Recommendations for Outpatient Follow-up:  1. PCP Dr.Duncan in 1 week 2. Please repeat CXR in 4-6weeks   Discharge Diagnoses:  Principal Problem:   Acute respiratory failure with hypoxia ([place])   Community acquired pneumonia   Iron deficiency anemia   Essential hypertension   Atrial fibrillation (HCC)   Chronic kidney disease, stage III (moderate)   Coronary artery disease   Chronic combined systolic and diastolic congestive heart failure ([place])   Discharge Condition: stable  Diet recommendation: heart healthy  Transition Care Management Follow-up Telephone Call      How have you been since you were released from the hospital? Fine.   Do you understand why you were in the hospital? yes   Do you understand the discharge instructions? yes   Where were you discharged to? Home alone.   Items Reviewed:  Medications reviewed: yes  Allergies reviewed: yes  Dietary changes reviewed: yes  Referrals reviewed: yes   Functional Questionnaire:   Activities of Daily Living (ADLs):   She states they are independent in the following: ambulation, bathing and hygiene, feeding, continence, grooming, toileting and dressing States they require assistance with the following: N/A   Any transportation issues/concerns?: yes, pt states she will have to find a ride   Any patient concerns? no   Confirmed importance and date/time of follow-up visits scheduled yes  Provider Appointment booked with Dr.Duncan 12/02/16 @5   Confirmed with patient if condition begins to worsen call PCP or go to the ER.  Patient was given the office number and encouraged to call back with question or concerns.  : yes

## 2016-11-28 DIAGNOSIS — Z951 Presence of aortocoronary bypass graft: Secondary | ICD-10-CM | POA: Diagnosis not present

## 2016-11-28 DIAGNOSIS — I2510 Atherosclerotic heart disease of native coronary artery without angina pectoris: Secondary | ICD-10-CM | POA: Diagnosis not present

## 2016-11-28 DIAGNOSIS — I130 Hypertensive heart and chronic kidney disease with heart failure and stage 1 through stage 4 chronic kidney disease, or unspecified chronic kidney disease: Secondary | ICD-10-CM | POA: Diagnosis not present

## 2016-11-28 DIAGNOSIS — I5042 Chronic combined systolic (congestive) and diastolic (congestive) heart failure: Secondary | ICD-10-CM | POA: Diagnosis not present

## 2016-11-28 DIAGNOSIS — I4891 Unspecified atrial fibrillation: Secondary | ICD-10-CM | POA: Diagnosis not present

## 2016-11-28 DIAGNOSIS — J189 Pneumonia, unspecified organism: Secondary | ICD-10-CM | POA: Diagnosis not present

## 2016-11-28 DIAGNOSIS — Z794 Long term (current) use of insulin: Secondary | ICD-10-CM | POA: Diagnosis not present

## 2016-11-28 DIAGNOSIS — N183 Chronic kidney disease, stage 3 (moderate): Secondary | ICD-10-CM | POA: Diagnosis not present

## 2016-11-28 DIAGNOSIS — Z742 Need for assistance at home and no other household member able to render care: Secondary | ICD-10-CM | POA: Diagnosis not present

## 2016-11-28 DIAGNOSIS — Z87891 Personal history of nicotine dependence: Secondary | ICD-10-CM | POA: Diagnosis not present

## 2016-11-28 DIAGNOSIS — D509 Iron deficiency anemia, unspecified: Secondary | ICD-10-CM | POA: Diagnosis not present

## 2016-11-28 DIAGNOSIS — I739 Peripheral vascular disease, unspecified: Secondary | ICD-10-CM | POA: Diagnosis not present

## 2016-11-28 DIAGNOSIS — E1122 Type 2 diabetes mellitus with diabetic chronic kidney disease: Secondary | ICD-10-CM | POA: Diagnosis not present

## 2016-11-28 DIAGNOSIS — M810 Age-related osteoporosis without current pathological fracture: Secondary | ICD-10-CM | POA: Diagnosis not present

## 2016-11-28 DIAGNOSIS — Z853 Personal history of malignant neoplasm of breast: Secondary | ICD-10-CM | POA: Diagnosis not present

## 2016-11-28 DIAGNOSIS — Z7982 Long term (current) use of aspirin: Secondary | ICD-10-CM | POA: Diagnosis not present

## 2016-11-30 ENCOUNTER — Other Ambulatory Visit: Payer: Self-pay | Admitting: Family Medicine

## 2016-11-30 ENCOUNTER — Other Ambulatory Visit: Payer: Self-pay

## 2016-11-30 DIAGNOSIS — E1122 Type 2 diabetes mellitus with diabetic chronic kidney disease: Secondary | ICD-10-CM | POA: Diagnosis not present

## 2016-11-30 DIAGNOSIS — N183 Chronic kidney disease, stage 3 (moderate): Secondary | ICD-10-CM | POA: Diagnosis not present

## 2016-11-30 DIAGNOSIS — I130 Hypertensive heart and chronic kidney disease with heart failure and stage 1 through stage 4 chronic kidney disease, or unspecified chronic kidney disease: Secondary | ICD-10-CM | POA: Diagnosis not present

## 2016-11-30 DIAGNOSIS — J189 Pneumonia, unspecified organism: Secondary | ICD-10-CM | POA: Diagnosis not present

## 2016-11-30 DIAGNOSIS — D509 Iron deficiency anemia, unspecified: Secondary | ICD-10-CM | POA: Diagnosis not present

## 2016-11-30 DIAGNOSIS — I5042 Chronic combined systolic (congestive) and diastolic (congestive) heart failure: Secondary | ICD-10-CM | POA: Diagnosis not present

## 2016-11-30 NOTE — Patient Outreach (Signed)
[facility]) Care Management  11/30/2016  LIANI LEBEOUF 1934/10/14 [id number]    EMMI- PNA RED ON EMMI ALERT Day # 1/2 Date: 11/27/16, 11/28/16 Red Alert Reason: "Knowhow/when to take meds? No"  Swelling in hands/feet or changes in weight? Yes"    Outreach attempt #1 to patient. Spoke with patient. Reviewed and addressed red alert. Patient states that she did not respond to these questions the way the machine interpreted them. She staets that she is very knowledgeable regarding her meds. She manages her meds on her own, has all her meds and knows when and how to take them. Patient reports that she had swelling while in the hospital and was discharged with swelling. She reports that swelling has improved as she elevates her feet often and uses support hose. Patient confirmed that she has PCP f/u appt on 12/02/16. She states she has transportation. She denies any further RN CM needs or concerns at this time. Patient informed that she will continue to get automated post discharge EMMI calls and will receive a call from a nurse if any of her responses are abnormal. She voiced understanding and was appreciative of call.      Plan: RN CM will notify [facility] administrative assistant of case status.  Enzo Montgomery, RN,BSN,CCM [facility] Telephonic Care Management Coordinator Direct Phone: [phone] Toll Free: [phone] Fax: [phone]

## 2016-12-01 DIAGNOSIS — J189 Pneumonia, unspecified organism: Secondary | ICD-10-CM | POA: Diagnosis not present

## 2016-12-01 DIAGNOSIS — D509 Iron deficiency anemia, unspecified: Secondary | ICD-10-CM | POA: Diagnosis not present

## 2016-12-01 DIAGNOSIS — I130 Hypertensive heart and chronic kidney disease with heart failure and stage 1 through stage 4 chronic kidney disease, or unspecified chronic kidney disease: Secondary | ICD-10-CM | POA: Diagnosis not present

## 2016-12-01 DIAGNOSIS — N183 Chronic kidney disease, stage 3 (moderate): Secondary | ICD-10-CM | POA: Diagnosis not present

## 2016-12-01 DIAGNOSIS — I5042 Chronic combined systolic (congestive) and diastolic (congestive) heart failure: Secondary | ICD-10-CM | POA: Diagnosis not present

## 2016-12-01 DIAGNOSIS — E1122 Type 2 diabetes mellitus with diabetic chronic kidney disease: Secondary | ICD-10-CM | POA: Diagnosis not present

## 2016-12-02 ENCOUNTER — Encounter: Payer: Self-pay | Admitting: Family Medicine

## 2016-12-02 ENCOUNTER — Ambulatory Visit (INDEPENDENT_AMBULATORY_CARE_PROVIDER_SITE_OTHER): Payer: Medicare Other | Admitting: Family Medicine

## 2016-12-02 VITALS — BP 124/70 | HR 64 | Temp 98.8°F | Wt 131.2 lb

## 2016-12-02 DIAGNOSIS — J189 Pneumonia, unspecified organism: Secondary | ICD-10-CM | POA: Diagnosis not present

## 2016-12-02 NOTE — Progress Notes (Signed)
Pre visit review using our clinic review tool, if applicable. No additional management support is needed unless otherwise documented below in the visit note. 

## 2016-12-02 NOTE — Patient Instructions (Signed)
You had pneumonia with low oxygen level and high carbon dioxide level.   You still need a little extra oxygen, keep wearing it for now.  Go to the lab on the way out.  We'll contact you with your lab report. Don't change your meds for now.  Plan on recheck chest xray in about 4 weeks.   Plan on recheck in about 2 weeks here to see about continued use of oxygen.  Take care.  Glad to see you.

## 2016-12-02 NOTE — Progress Notes (Signed)
Admit date: 11/20/2016 Discharge date: 11/25/2016  Time spent: 45 minutes  Recommendations for Outpatient Follow-up:  1. PCP Dr.Duncan in 1 week 2. Please repeat CXR in 4-6weeks   Discharge Diagnoses:  Principal Problem:   Acute respiratory failure with hypoxia ([place])   Community acquired pneumonia   Iron deficiency anemia   Essential hypertension   Atrial fibrillation (HCC)   Chronic kidney disease, stage III (moderate)   Coronary artery disease   Chronic combined systolic and diastolic congestive heart failure [facility])   Discharge Condition: stable  Diet recommendation: heart healthy       Filed Weights   11/24/16 0328 11/24/16 1124 11/25/16 0510  Weight: 63.1 kg (139 lb 1.8 oz) 60.1 kg (132 lb 6.4 oz) 59.6 kg (131 lb 6.4 oz)    History of present illness:  Pt. with PMH of CAD, A. fib, HTN, type II DM, CKD stage II ; admitted on 11/20/2016, with complaint of shortness of breath, was found to have Acute hypercarbic respiratory failure with acute encephalopathy.  Hospital Course:  1. Acute respiratory failure with hypoxia (HCC) -due to Community-acquired pneumonia -improved with Abx, changed to PO Cefdinir at discharge  2. Acute encephalopathy. -Secondary to hypercarbia, hypoglycemia, meds, infection -resolved  3.Type 2 diabetes mellitus. -was hypoglycemic on admission due to infection, poor Po intake -stable, resumed home regimen at discharge -if she has future episodes of hypoglycemia, consider stopping GLyburide  4Acute on chronic kidney disease stage II. -improved with hydration and normalized at the time of discharge  5.A. Fib. CHADSVasc 7 -continue on Betapace, not on anticoagulation due to history of spontaneous retroperitoneal hemorrhage. -in NSR now  6.Coronary artery disease S/P CABG 2003. -stable  =========================================== Patient was admitted with acute respiratory failure due to community-acquired pneumonia.  She was started on IV antibiotics, change to oral antibiotics. X-ray reviewed with patient at office visit today. She had concurrent encephalopathy due to hypercarbia, hypoglycemia, hypoxia, infection. This is resolved in the meantime. Discussed with patient today. She had hypoglycemia related to diabetes and her current illness. She has been able tolerate baseline diabetic medications since coming home. History of chronic kidney disease. History of A. fib, not on anticoagulation.  Since coming home she has continued on nasal cannula oxygen. She did not require supplemental oxygen prior to the most recent hospitalization. No fevers or chills. No cough. No sputum. She is clearly improved. She is not back to baseline, but her mentation is much improved. Pathophysiology of her recent illness discussed with patient. Due for follow-up labs. Not yet due for follow-up chest x-ray.  PMH and SH reviewed  ROS: Per HPI unless specifically indicated in ROS section   Meds, vitals, and allergies reviewed.   GEN: nad, alert and oriented, on O2 via [place] HEENT: mucous membranes moist NECK: supple w/o LA CV: rrr.  PULM: ctab, no inc wob ABD: soft, +bs EXT: trace BLE edema SKIN: no acute rash

## 2016-12-03 ENCOUNTER — Ambulatory Visit: Payer: Medicare Other | Admitting: Family Medicine

## 2016-12-03 ENCOUNTER — Encounter: Payer: Self-pay | Admitting: Family Medicine

## 2016-12-03 LAB — COMPREHENSIVE METABOLIC PANEL
ALK PHOS: 63 U/L (ref 39–117)
ALT: 14 U/L (ref 0–35)
AST: 14 U/L (ref 0–37)
Albumin: 3.9 g/dL (ref 3.5–5.2)
BUN: 18 mg/dL (ref 6–23)
CHLORIDE: 96 meq/L (ref 96–112)
CO2: 40 meq/L — AB (ref 19–32)
Calcium: 9.6 mg/dL (ref 8.4–10.5)
Creatinine, Ser: 0.93 mg/dL (ref 0.40–1.20)
GFR: 61.75 mL/min (ref >60.00)
GLUCOSE: 161 mg/dL — AB (ref 70–99)
POTASSIUM: 4.8 meq/L (ref 3.5–5.1)
SODIUM: 139 meq/L (ref 135–145)
TOTAL PROTEIN: 6.7 g/dL (ref 6.0–8.3)
Total Bilirubin: 0.5 mg/dL (ref 0.2–1.2)

## 2016-12-03 LAB — CBC WITH DIFFERENTIAL/PLATELET
Basophils Absolute: 0.1 10*3/uL (ref 0.0–0.1)
Basophils Relative: 0.8 % (ref 0.0–3.0)
EOS PCT: 0.9 % (ref 0.0–5.0)
Eosinophils Absolute: 0.1 10*3/uL (ref 0.0–0.7)
HCT: 37.6 % (ref 36.0–46.0)
Hemoglobin: 12.1 g/dL (ref 12.0–15.0)
Lymphocytes Relative: 17.5 % (ref 12.0–46.0)
Lymphs Abs: 1.2 10*3/uL (ref 0.7–4.0)
MCHC: 32.1 g/dL (ref 30.0–36.0)
MCV: 91.8 fl (ref 78.0–100.0)
MONO ABS: 2.2 10*3/uL — AB (ref 0.1–1.0)
NEUTROS ABS: 3.4 10*3/uL (ref 1.4–7.7)
Neutrophils Relative %: 49.4 % (ref 43.0–77.0)
PLATELETS: 261 10*3/uL (ref 150.0–400.0)
RBC: 4.09 Mil/uL (ref 3.87–5.11)
RDW: 15.7 % — AB (ref 11.5–15.5)
WBC: 6.9 10*3/uL (ref 4.0–10.5)

## 2016-12-03 NOTE — Assessment & Plan Note (Signed)
Patient was admitted with acute respiratory failure due to community-acquired pneumonia. She was started on IV antibiotics, change to oral antibiotics. X-ray reviewed with patient at office visit today. She had concurrent encephalopathy due to hypercarbia, hypoglycemia, hypoxia, infection. This is resolved in the meantime. Discussed with patient today. She had hypoglycemia related to diabetes and her current illness. She has been able tolerate baseline diabetic medications since coming home. History of chronic kidney disease. History of A. fib, not on anticoagulation.  Clearly improved in the meantime. Done with antibiotics. Still on supplemental oxygen, not hypoxic today. Check routine labs today. Continue current medications. Will need recheck here in about 2 weeks to make decisions about ongoing oxygen use. Needs recheck chest x-ray in about 4 weeks. All discussed with patient. All questions answered. At this point okay for outpatient follow-up. She agrees with plan.

## 2016-12-04 DIAGNOSIS — N183 Chronic kidney disease, stage 3 (moderate): Secondary | ICD-10-CM | POA: Diagnosis not present

## 2016-12-04 DIAGNOSIS — J189 Pneumonia, unspecified organism: Secondary | ICD-10-CM | POA: Diagnosis not present

## 2016-12-04 DIAGNOSIS — I5042 Chronic combined systolic (congestive) and diastolic (congestive) heart failure: Secondary | ICD-10-CM | POA: Diagnosis not present

## 2016-12-04 DIAGNOSIS — E1122 Type 2 diabetes mellitus with diabetic chronic kidney disease: Secondary | ICD-10-CM | POA: Diagnosis not present

## 2016-12-04 DIAGNOSIS — D509 Iron deficiency anemia, unspecified: Secondary | ICD-10-CM | POA: Diagnosis not present

## 2016-12-04 DIAGNOSIS — I130 Hypertensive heart and chronic kidney disease with heart failure and stage 1 through stage 4 chronic kidney disease, or unspecified chronic kidney disease: Secondary | ICD-10-CM | POA: Diagnosis not present

## 2016-12-07 DIAGNOSIS — D509 Iron deficiency anemia, unspecified: Secondary | ICD-10-CM | POA: Diagnosis not present

## 2016-12-07 DIAGNOSIS — I5042 Chronic combined systolic (congestive) and diastolic (congestive) heart failure: Secondary | ICD-10-CM | POA: Diagnosis not present

## 2016-12-07 DIAGNOSIS — E1122 Type 2 diabetes mellitus with diabetic chronic kidney disease: Secondary | ICD-10-CM | POA: Diagnosis not present

## 2016-12-07 DIAGNOSIS — I130 Hypertensive heart and chronic kidney disease with heart failure and stage 1 through stage 4 chronic kidney disease, or unspecified chronic kidney disease: Secondary | ICD-10-CM | POA: Diagnosis not present

## 2016-12-07 DIAGNOSIS — J189 Pneumonia, unspecified organism: Secondary | ICD-10-CM | POA: Diagnosis not present

## 2016-12-07 DIAGNOSIS — N183 Chronic kidney disease, stage 3 (moderate): Secondary | ICD-10-CM | POA: Diagnosis not present

## 2016-12-09 ENCOUNTER — Other Ambulatory Visit: Payer: Self-pay | Admitting: Cardiovascular Disease

## 2016-12-09 DIAGNOSIS — I130 Hypertensive heart and chronic kidney disease with heart failure and stage 1 through stage 4 chronic kidney disease, or unspecified chronic kidney disease: Secondary | ICD-10-CM | POA: Diagnosis not present

## 2016-12-09 DIAGNOSIS — I5042 Chronic combined systolic (congestive) and diastolic (congestive) heart failure: Secondary | ICD-10-CM | POA: Diagnosis not present

## 2016-12-09 DIAGNOSIS — E1122 Type 2 diabetes mellitus with diabetic chronic kidney disease: Secondary | ICD-10-CM | POA: Diagnosis not present

## 2016-12-09 DIAGNOSIS — J189 Pneumonia, unspecified organism: Secondary | ICD-10-CM | POA: Diagnosis not present

## 2016-12-09 DIAGNOSIS — D509 Iron deficiency anemia, unspecified: Secondary | ICD-10-CM | POA: Diagnosis not present

## 2016-12-09 DIAGNOSIS — N183 Chronic kidney disease, stage 3 (moderate): Secondary | ICD-10-CM | POA: Diagnosis not present

## 2016-12-10 ENCOUNTER — Other Ambulatory Visit: Payer: Medicare Other

## 2016-12-11 DIAGNOSIS — D509 Iron deficiency anemia, unspecified: Secondary | ICD-10-CM | POA: Diagnosis not present

## 2016-12-11 DIAGNOSIS — J189 Pneumonia, unspecified organism: Secondary | ICD-10-CM | POA: Diagnosis not present

## 2016-12-11 DIAGNOSIS — I130 Hypertensive heart and chronic kidney disease with heart failure and stage 1 through stage 4 chronic kidney disease, or unspecified chronic kidney disease: Secondary | ICD-10-CM | POA: Diagnosis not present

## 2016-12-11 DIAGNOSIS — I5042 Chronic combined systolic (congestive) and diastolic (congestive) heart failure: Secondary | ICD-10-CM | POA: Diagnosis not present

## 2016-12-11 DIAGNOSIS — E1122 Type 2 diabetes mellitus with diabetic chronic kidney disease: Secondary | ICD-10-CM | POA: Diagnosis not present

## 2016-12-11 DIAGNOSIS — N183 Chronic kidney disease, stage 3 (moderate): Secondary | ICD-10-CM | POA: Diagnosis not present

## 2016-12-14 ENCOUNTER — Ambulatory Visit: Payer: Medicare Other | Admitting: Family Medicine

## 2016-12-14 DIAGNOSIS — I5042 Chronic combined systolic (congestive) and diastolic (congestive) heart failure: Secondary | ICD-10-CM | POA: Diagnosis not present

## 2016-12-14 DIAGNOSIS — I130 Hypertensive heart and chronic kidney disease with heart failure and stage 1 through stage 4 chronic kidney disease, or unspecified chronic kidney disease: Secondary | ICD-10-CM | POA: Diagnosis not present

## 2016-12-14 DIAGNOSIS — J189 Pneumonia, unspecified organism: Secondary | ICD-10-CM | POA: Diagnosis not present

## 2016-12-14 DIAGNOSIS — D509 Iron deficiency anemia, unspecified: Secondary | ICD-10-CM | POA: Diagnosis not present

## 2016-12-14 DIAGNOSIS — N183 Chronic kidney disease, stage 3 (moderate): Secondary | ICD-10-CM | POA: Diagnosis not present

## 2016-12-14 DIAGNOSIS — E1122 Type 2 diabetes mellitus with diabetic chronic kidney disease: Secondary | ICD-10-CM | POA: Diagnosis not present

## 2016-12-15 ENCOUNTER — Other Ambulatory Visit: Payer: Self-pay

## 2016-12-15 MED ORDER — SOTALOL HCL (AF) 80 MG PO TABS: 0.5000 | ORAL_TABLET | Freq: Two times a day (BID) | ORAL | 3 refills | Status: DC

## 2016-12-16 ENCOUNTER — Encounter: Payer: Self-pay | Admitting: Family Medicine

## 2016-12-16 ENCOUNTER — Ambulatory Visit (INDEPENDENT_AMBULATORY_CARE_PROVIDER_SITE_OTHER): Payer: Medicare Other | Admitting: Family Medicine

## 2016-12-16 VITALS — BP 120/60 | HR 76 | Temp 98.3°F | Wt 126.2 lb

## 2016-12-16 DIAGNOSIS — I25708 Atherosclerosis of coronary artery bypass graft(s), unspecified, with other forms of angina pectoris: Secondary | ICD-10-CM

## 2016-12-16 DIAGNOSIS — R0689 Other abnormalities of breathing: Secondary | ICD-10-CM | POA: Diagnosis not present

## 2016-12-16 DIAGNOSIS — J189 Pneumonia, unspecified organism: Secondary | ICD-10-CM

## 2016-12-16 LAB — BASIC METABOLIC PANEL
BUN: 27 mg/dL — AB (ref 6–23)
CHLORIDE: 97 meq/L (ref 96–112)
CO2: 37 mEq/L — ABNORMAL HIGH (ref 19–32)
Calcium: 10 mg/dL (ref 8.4–10.5)
Creatinine, Ser: 1.01 mg/dL (ref 0.40–1.20)
GFR: 56.14 mL/min — ABNORMAL LOW (ref >60.00)
GLUCOSE: 149 mg/dL — AB (ref 70–99)
Potassium: 5.7 mEq/L — ABNORMAL HIGH (ref 3.5–5.1)
SODIUM: 139 meq/L (ref 135–145)

## 2016-12-16 NOTE — Progress Notes (Signed)
Status post treatment for pneumonia. Here for routine follow-up. Still on oxygen by nasal cannula. She is annoyed by tubing with O2 and runny nose ,she is putting up with it.   Her memory is back to baseline.  Discussed with patient about previous hypercarbia related to her acute illness. She has mild persistent hypercarbia on last set of labs.  Due for follow-up labs today. She has noticed a faint hand tremor bilaterally in the meantime with activity since her hospitalization. Her appetite is slowly improving but she is not back to her baseline, but his is as expected, d/w pt.  No fevers. No cough. No acute complaints otherwise.  Meds, vitals, and allergies reviewed.   ROS: Per HPI unless specifically indicated in ROS section   GEN: nad, alert and oriented, on O2.  HEENT: mucous membranes moist NECK: supple w/o LA CV: rrr. PULM: ctab, no inc wob ABD: soft, +bs EXT: no edema SKIN: no acute rash Faint bilateral hand tremor noted when not at rest.

## 2016-12-16 NOTE — Progress Notes (Signed)
Pre visit review using our clinic review tool, if applicable. No additional management support is needed unless otherwise documented below in the visit note. 

## 2016-12-16 NOTE — Patient Instructions (Addendum)
Go to the lab on the way out.  We'll contact you with your lab report. Xray at a lab visit at the end of the month, if you don't have an office visit.   Take care.  Glad to see you.

## 2016-12-17 ENCOUNTER — Other Ambulatory Visit: Payer: Self-pay | Admitting: Family Medicine

## 2016-12-17 ENCOUNTER — Telehealth: Payer: Self-pay | Admitting: Family Medicine

## 2016-12-17 DIAGNOSIS — I5042 Chronic combined systolic (congestive) and diastolic (congestive) heart failure: Secondary | ICD-10-CM | POA: Diagnosis not present

## 2016-12-17 DIAGNOSIS — N183 Chronic kidney disease, stage 3 (moderate): Secondary | ICD-10-CM | POA: Diagnosis not present

## 2016-12-17 DIAGNOSIS — I130 Hypertensive heart and chronic kidney disease with heart failure and stage 1 through stage 4 chronic kidney disease, or unspecified chronic kidney disease: Secondary | ICD-10-CM | POA: Diagnosis not present

## 2016-12-17 DIAGNOSIS — E1122 Type 2 diabetes mellitus with diabetic chronic kidney disease: Secondary | ICD-10-CM | POA: Diagnosis not present

## 2016-12-17 DIAGNOSIS — E875 Hyperkalemia: Secondary | ICD-10-CM

## 2016-12-17 DIAGNOSIS — D509 Iron deficiency anemia, unspecified: Secondary | ICD-10-CM | POA: Diagnosis not present

## 2016-12-17 DIAGNOSIS — J189 Pneumonia, unspecified organism: Secondary | ICD-10-CM | POA: Diagnosis not present

## 2016-12-17 MED ORDER — SODIUM POLYSTYRENE SULFONATE 15 GM/60ML PO SUSP: 15.0000 g | Freq: Once | ORAL | 0 refills | Status: AC

## 2016-12-17 NOTE — Telephone Encounter (Signed)
-----   Message from Tonia Ghent, MD sent at 12/17/2016  7:10 AM EDT ----- Call pt.  K is up, needs a dose of kayexalate.  And recheck BMET early next week, ordered.  I sent rx for 2 doses of kayexalate- take one dose now and hold the other in case another dose is needed after repeat bmet.   Her Co2 is slightly better but not back to normal.  Would continue as planned with O2 for now, will await the other f/u bmet in the meantime.  Still needs CXR at the end of the month.  Thanks.

## 2016-12-17 NOTE — Telephone Encounter (Signed)
Patient informed of lab results and recommendations.

## 2016-12-17 NOTE — Assessment & Plan Note (Signed)
Lungs clear. Overall improved. Unclear if the tremor is related to the residual from the acute illness. Follow clinically. She agrees. Check notes on labs. Discussed with patient about hypoxia versus hypercarbia. Not hypoxic with supplemental O2 at this point. Not yet due for repeat chest x-ray. Recheck at the end of the month. She agrees. Continue O2 via nasal cannula for now. >25 minutes spent in face to face time with patient, >50% spent in counselling or coordination of care.

## 2016-12-19 ENCOUNTER — Other Ambulatory Visit: Payer: Self-pay | Admitting: Family Medicine

## 2016-12-21 ENCOUNTER — Other Ambulatory Visit: Payer: Self-pay | Admitting: Family Medicine

## 2016-12-24 DIAGNOSIS — E1122 Type 2 diabetes mellitus with diabetic chronic kidney disease: Secondary | ICD-10-CM | POA: Diagnosis not present

## 2016-12-24 DIAGNOSIS — J189 Pneumonia, unspecified organism: Secondary | ICD-10-CM | POA: Diagnosis not present

## 2016-12-24 DIAGNOSIS — I130 Hypertensive heart and chronic kidney disease with heart failure and stage 1 through stage 4 chronic kidney disease, or unspecified chronic kidney disease: Secondary | ICD-10-CM | POA: Diagnosis not present

## 2016-12-24 DIAGNOSIS — D509 Iron deficiency anemia, unspecified: Secondary | ICD-10-CM | POA: Diagnosis not present

## 2016-12-24 DIAGNOSIS — N183 Chronic kidney disease, stage 3 (moderate): Secondary | ICD-10-CM | POA: Diagnosis not present

## 2016-12-24 DIAGNOSIS — I5042 Chronic combined systolic (congestive) and diastolic (congestive) heart failure: Secondary | ICD-10-CM | POA: Diagnosis not present

## 2016-12-31 ENCOUNTER — Other Ambulatory Visit (INDEPENDENT_AMBULATORY_CARE_PROVIDER_SITE_OTHER): Payer: Medicare Other

## 2016-12-31 ENCOUNTER — Ambulatory Visit (INDEPENDENT_AMBULATORY_CARE_PROVIDER_SITE_OTHER)
Admission: RE | Admit: 2016-12-31 | Discharge: 2016-12-31 | Disposition: A | Discharge status: Home / Self Care | Payer: Medicare Other | Source: Ambulatory Visit | Attending: Family Medicine | Admitting: Family Medicine

## 2016-12-31 DIAGNOSIS — E875 Hyperkalemia: Secondary | ICD-10-CM

## 2016-12-31 DIAGNOSIS — J189 Pneumonia, unspecified organism: Secondary | ICD-10-CM | POA: Diagnosis not present

## 2016-12-31 LAB — BASIC METABOLIC PANEL
BUN: 22 mg/dL (ref 6–23)
CALCIUM: 9.5 mg/dL (ref 8.4–10.5)
CHLORIDE: 98 meq/L (ref 96–112)
CO2: 36 meq/L — AB (ref 19–32)
Creatinine, Ser: 0.95 mg/dL (ref 0.40–1.20)
GFR: 60.24 mL/min (ref >60.00)
Glucose, Bld: 231 mg/dL — ABNORMAL HIGH (ref 70–99)
POTASSIUM: 5 meq/L (ref 3.5–5.1)
SODIUM: 138 meq/L (ref 135–145)

## 2017-01-05 DIAGNOSIS — I130 Hypertensive heart and chronic kidney disease with heart failure and stage 1 through stage 4 chronic kidney disease, or unspecified chronic kidney disease: Secondary | ICD-10-CM | POA: Diagnosis not present

## 2017-01-05 DIAGNOSIS — I5042 Chronic combined systolic (congestive) and diastolic (congestive) heart failure: Secondary | ICD-10-CM | POA: Diagnosis not present

## 2017-01-05 DIAGNOSIS — J189 Pneumonia, unspecified organism: Secondary | ICD-10-CM | POA: Diagnosis not present

## 2017-01-05 DIAGNOSIS — E1122 Type 2 diabetes mellitus with diabetic chronic kidney disease: Secondary | ICD-10-CM | POA: Diagnosis not present

## 2017-01-05 DIAGNOSIS — D509 Iron deficiency anemia, unspecified: Secondary | ICD-10-CM | POA: Diagnosis not present

## 2017-01-05 DIAGNOSIS — N183 Chronic kidney disease, stage 3 (moderate): Secondary | ICD-10-CM | POA: Diagnosis not present

## 2017-01-12 ENCOUNTER — Other Ambulatory Visit: Payer: Self-pay

## 2017-01-12 MED ORDER — SOTALOL HCL 80 MG PO TABS: 40.0000 mg | ORAL_TABLET | Freq: Two times a day (BID) | ORAL | 3 refills | Status: DC

## 2017-01-12 NOTE — Telephone Encounter (Signed)
Rx(s) sent to pharmacy electronically.  

## 2017-01-19 ENCOUNTER — Encounter: Payer: Self-pay | Admitting: Family Medicine

## 2017-01-19 ENCOUNTER — Ambulatory Visit (INDEPENDENT_AMBULATORY_CARE_PROVIDER_SITE_OTHER): Payer: Medicare Other | Admitting: Family Medicine

## 2017-01-19 ENCOUNTER — Ambulatory Visit (INDEPENDENT_AMBULATORY_CARE_PROVIDER_SITE_OTHER)
Admission: RE | Admit: 2017-01-19 | Discharge: 2017-01-19 | Disposition: A | Discharge status: Home / Self Care | Payer: Medicare Other | Source: Ambulatory Visit | Attending: Family Medicine | Admitting: Family Medicine

## 2017-01-19 VITALS — BP 146/74 | HR 71 | Temp 97.6°F | Wt 129.2 lb

## 2017-01-19 DIAGNOSIS — R0902 Hypoxemia: Secondary | ICD-10-CM | POA: Diagnosis not present

## 2017-01-19 DIAGNOSIS — I25708 Atherosclerosis of coronary artery bypass graft(s), unspecified, with other forms of angina pectoris: Secondary | ICD-10-CM | POA: Diagnosis not present

## 2017-01-19 DIAGNOSIS — E1159 Type 2 diabetes mellitus with other circulatory complications: Secondary | ICD-10-CM

## 2017-01-19 DIAGNOSIS — R251 Tremor, unspecified: Secondary | ICD-10-CM | POA: Diagnosis not present

## 2017-01-19 DIAGNOSIS — E875 Hyperkalemia: Secondary | ICD-10-CM

## 2017-01-19 LAB — BASIC METABOLIC PANEL
BUN: 22 mg/dL (ref 6–23)
CHLORIDE: 98 meq/L (ref 96–112)
CO2: 35 mEq/L — ABNORMAL HIGH (ref 19–32)
CREATININE: 0.96 mg/dL (ref 0.40–1.20)
Calcium: 9.5 mg/dL (ref 8.4–10.5)
GFR: 59.51 mL/min — ABNORMAL LOW (ref >60.00)
Glucose, Bld: 198 mg/dL — ABNORMAL HIGH (ref 70–99)
POTASSIUM: 4.8 meq/L (ref 3.5–5.1)
Sodium: 138 mEq/L (ref 135–145)

## 2017-01-19 NOTE — Progress Notes (Signed)
Still on O2.  She still has dec in spO2 with activity off O2, down to the 70s on her home monitor on RA.  On O2, her spO2 stays up.   D/w pt about call cautions re: O2 cord.    She still notes occ tremor and occ muscle twitch.  The two are different.  Occ noted in the hands.    She had had some previous low sugars, she has decreased her insulin use in the meantime and now her sugars are ranging around 110-120 in the mornings. Discussed with patient. See plan.  History of abnormal potassium. Due for recheck. Still on ACE inhibitor. She was previously on a higher potassium diet but she has cut back on some of that now. Previous labs discussed with patient.  PMH and SH reviewed  ROS: Per HPI unless specifically indicated in ROS section   Meds, vitals, and allergies reviewed.   GEN: nad, alert and oriented, on O2 via [place].  HEENT: mucous membranes moist NECK: supple w/o LA CV: rrr.  PULM: ctab, no inc wob ABD: soft, +bs EXT: trace BLE edema SKIN: no acute rash Minimal bilateral hand tremor noted during the exam. This is symmetric. No jerking motions noted.

## 2017-01-19 NOTE — Patient Instructions (Addendum)
Go to the lab on the way out.  We'll contact you with your lab and xray report. Rosaria Ferries will call about your referral. Take care.  Glad to see you.  Recheck in about mid-May re: diabetes.

## 2017-01-19 NOTE — Progress Notes (Signed)
Pre visit review using our clinic review tool, if applicable. No additional management support is needed unless otherwise documented below in the visit note. 

## 2017-01-20 ENCOUNTER — Encounter: Payer: Self-pay | Admitting: Family Medicine

## 2017-01-20 DIAGNOSIS — R251 Tremor, unspecified: Secondary | ICD-10-CM | POA: Insufficient documentation

## 2017-01-20 DIAGNOSIS — E875 Hyperkalemia: Secondary | ICD-10-CM | POA: Insufficient documentation

## 2017-01-20 NOTE — Assessment & Plan Note (Signed)
She cut back on her insulin in the meantime her morning sugars are improved, more stable. Continue as is and update me as needed. She agrees.

## 2017-01-20 NOTE — Assessment & Plan Note (Signed)
History of. Recheck labs today.

## 2017-01-20 NOTE — Assessment & Plan Note (Signed)
She still hypoxic on home testing. Given that we did not retest her at the clinic today. We did refer her to pulmonary. Continue O2 in the meantime. Recheck chest x-ray. See notes on imaging.

## 2017-01-20 NOTE — Assessment & Plan Note (Signed)
She doesn't have seizure symptoms. The jerking movement may be related to hypercarbia, but I doubt it- recheck CO2 today. Unclear if the tremor is just related to muscular deconditioning from the recent illness. It is symmetric. She does not have typical parkinsonian features otherwise. We can follow this. She agrees.

## 2017-01-22 DIAGNOSIS — E1122 Type 2 diabetes mellitus with diabetic chronic kidney disease: Secondary | ICD-10-CM | POA: Diagnosis not present

## 2017-01-22 DIAGNOSIS — J189 Pneumonia, unspecified organism: Secondary | ICD-10-CM | POA: Diagnosis not present

## 2017-01-22 DIAGNOSIS — I130 Hypertensive heart and chronic kidney disease with heart failure and stage 1 through stage 4 chronic kidney disease, or unspecified chronic kidney disease: Secondary | ICD-10-CM | POA: Diagnosis not present

## 2017-01-22 DIAGNOSIS — D509 Iron deficiency anemia, unspecified: Secondary | ICD-10-CM | POA: Diagnosis not present

## 2017-01-22 DIAGNOSIS — I5042 Chronic combined systolic (congestive) and diastolic (congestive) heart failure: Secondary | ICD-10-CM | POA: Diagnosis not present

## 2017-01-22 DIAGNOSIS — N183 Chronic kidney disease, stage 3 (moderate): Secondary | ICD-10-CM | POA: Diagnosis not present

## 2017-01-27 ENCOUNTER — Ambulatory Visit (INDEPENDENT_AMBULATORY_CARE_PROVIDER_SITE_OTHER): Payer: Medicare Other | Admitting: Pulmonary Disease

## 2017-01-27 ENCOUNTER — Other Ambulatory Visit: Payer: Medicare Other

## 2017-01-27 ENCOUNTER — Encounter: Payer: Self-pay | Admitting: Pulmonary Disease

## 2017-01-27 VITALS — BP 138/70 | HR 66 | Ht 63.5 in | Wt 130.6 lb

## 2017-01-27 DIAGNOSIS — J984 Other disorders of lung: Secondary | ICD-10-CM

## 2017-01-27 DIAGNOSIS — J9612 Chronic respiratory failure with hypercapnia: Secondary | ICD-10-CM

## 2017-01-27 DIAGNOSIS — J9611 Chronic respiratory failure with hypoxia: Secondary | ICD-10-CM

## 2017-01-27 DIAGNOSIS — I25708 Atherosclerosis of coronary artery bypass graft(s), unspecified, with other forms of angina pectoris: Secondary | ICD-10-CM | POA: Diagnosis not present

## 2017-01-27 DIAGNOSIS — M419 Scoliosis, unspecified: Secondary | ICD-10-CM

## 2017-01-27 NOTE — Progress Notes (Signed)
Past surgical history She  has a past surgical history that includes Appendectomy (1962); Dilation and curettage of uterus (1971 ); Dilation and curettage of uterus (1995); Breast surgery (02/01); Coronary artery bypass graft (02/09/02); Shoulder arthroscopy (05/04); Hemorrhoid surgery (09/11/04); Cataract extraction (10/10 and 02/11); Colostomy; Partial colectomy; Cardiac catheterization (2000); Cardiac catheterization (01/25/02); Cardiac catheterization (01/13/07); Cardiac catheterization (02/14/10); and Cardiac catheterization (04/17/2011).  Family history Her family history includes COPD in her brother; Diabetes in her mother and son; Gout in her brother; Heart disease in her brother, brother, and mother; Heart disease (age of onset: 37) in her father; Hyperlipidemia in her brother; Hypertension in her brother, brother, brother, father, mother, and son; Obesity in her brother.  Social history She  reports that she has never smoked. She has never used smokeless tobacco. She reports that she does not drink alcohol or use drugs.  Allergies  Allergen Reactions  . Actos [Pioglitazone] Other (See Comments)    edema  . Metformin And Related Other (See Comments)    Held due to renal function  . Zocor [Simvastatin] Other (See Comments)    Myalgias    Review of Systems  Constitutional: Negative for fever and unexpected weight change.  HENT: Positive for postnasal drip, rhinorrhea and sneezing. Negative for congestion, dental problem, ear pain, nosebleeds, sinus pressure, sore throat and trouble swallowing.   Eyes: Negative for redness and itching.  Respiratory: Negative for cough, chest tightness, shortness of breath and wheezing.   Cardiovascular: Positive for leg swelling. Negative for palpitations.  Gastrointestinal: Negative for nausea and vomiting.  Genitourinary: Negative for dysuria.  Musculoskeletal: Negative for joint swelling.  Skin: Negative for rash.  Neurological: Negative for  headaches.  Hematological: Does not bruise/bleed easily.  Psychiatric/Behavioral: Negative for dysphoric mood. The patient is not nervous/anxious.     Current Outpatient Prescriptions on File Prior to Visit  Medication Sig  . amLODipine (NORVASC) 10 MG tablet Take 10 mg by mouth daily.  Marland Kitchen aspirin EC 81 MG tablet Take 81 mg by mouth daily.  Marland Kitchen atorvastatin (LIPITOR) 20 MG tablet TAKE 1 TABLET BY MOUTH AT BEDTIME  . benazepril (LOTENSIN) 20 MG tablet Take 1 tablet (20 mg total) by mouth daily.  . Cholecalciferol (VITAMIN D) 1000 UNITS capsule Take 1,000 Units by mouth 2 (two) times daily.    Marland Kitchen glyBURIDE (DIABETA) 5 MG tablet TAKE 2 TABLETS (10 MG TOTAL) BY MOUTH DAILY WITH BREAKFAST.  Marland Kitchen insulin aspart (NOVOLOG) 100 UNIT/ML injection USE PER SLIDING SCALE--200-250=7 UNITS, 251-300=8 UNITS,301-350=9 UNITS, WITH MEALS  . Insulin Syringe-Needle U-100 (BD INSULIN SYRINGE ULTRAFINE) 31G X 5/16" 0.3 ML MISC USE DAILY AND AS DIRECTED SLIDING SCALE.  Multiple injections of insulin per day  . meclizine (ANTIVERT) 25 MG tablet Take 1 tablet (25 mg total) by mouth daily as needed. For dizziness  . nitroGLYCERIN (NITROSTAT) 0.4 MG SL tablet Place 1 tablet (0.4 mg total) under the tongue as needed for chest pain. As directed  . NON FORMULARY Oxygen 2 liters 24/7  . NOVOLOG 100 UNIT/ML injection USE PER SLIDING SCALE--200-250=4-5 UNITS, 251-300=5-6 UNITS,301-350=6-7 UNITS, WITH MEALS  . nystatin (MYCOSTATIN/NYSTOP) 100000 UNIT/GM POWD Apply up to 3 times a day if needed.  . ONE TOUCH ULTRA TEST test strip USE TO CHECK BLOOD SUGAR 4 TIMES A DAY. INSULIN-DEPENDENT. DIAGNOSIS: E11.9  . Polyvinyl Alcohol-Povidone (REFRESH OP) Place 1 drop into both eyes daily as needed.  Marland Kitchen RANEXA 500 MG 12 hr tablet TAKE 1 TABLET (500 MG TOTAL) BY MOUTH 2 (TWO) TIMES  DAILY. NEED APPOINTMENT FOR MORE REFILLS  . sotalol (BETAPACE) 80 MG tablet Take 0.5 tablets (40 mg total) by mouth 2 (two) times daily.   No current  facility-administered medications on file prior to visit.     Chief Complaint  Patient presents with  . PULMONARY CONSULT    Referred by Dr Damita Dunnings for Hypoxia, ?PNA. Recent CXR. Seen by TP and AD while in the Hospital for PNA 11/2016.     Pulmonary tests Ambulatory oximetry 01/27/17 >> SpO2 86% after 2 laps.  Placed on 2 liters  Cardiac tests Echo 01/31/16 >> EF 60 to 65%  Past medical history She  has a past medical history of Anemia, iron deficiency; Atrial fibrillation ([place]) (11/28/2010); Cancer ([place]); CAP (community acquired pneumonia); Chronic kidney disease (CKD), stage II (mild); Coronary artery disease; Diabetes mellitus, type 2 ([place]); Diverticulitis; History of breast cancer; Hyperlipemia; Hypertension; Nontraumatic retroperitoneal hematoma; Osteoporosis; and Peripheral vascular disease ([place]).  Vital signs BP 138/70 (BP Location: Left Arm, Cuff Size: Normal)   Pulse 66   Ht 5' 3.5" (1.613 m)   Wt 130 lb 9.6 oz (59.2 kg)   SpO2 100%   BMI 22.77 kg/m   History of present illness [name] is a 82 y.o. female with hypoxia.  She fell in October 2017.  She fractured several ribs on the left.  She had trouble taking deep breath and clearing respiratory secretions.  She ultimately developed pneumonia.  She was in hospital in February 2018 and had hypoxia/hypercapnic respiratory failure.  She recovered from pneumonia, but was required supplemental oxygen.  She has been using 2 liters oxygen ever since.  She never smoked.  She denies history of COPD, asthma, or prior pneumonia.  No hx of connective tissue disease.  She has history of kyphoscoliosis and osteopenia.  She feels her back curvature has gotten worse since she fractured her ribs.    She denies sinus congestion, sore throat, cough, wheeze, sputum, chest pain, fever.  She gets unsteady at times with her balance.  She gets ankle swelling Lt > Rt, but this gets better when she keeps her legs up.  She denies history of  thromboembolic disease.    She has checked her oxygen at home and her sat's drop to the mid 80's if she doesn't use her oxygen.   Physical exam  General - pleasant Eyes - wears glasses, pupils reactive ENT - No sinus tenderness, no oral exudate, no LAN, no thyromegaly, TM clear, pupils equal/reactive Cardiac - s1s2 regular, no murmur, pulses symmetric Chest - No wheeze/rales/dullness, good air entry, normal respiratory excursion Back - changes of kyphoscoliosis Abd - Soft, non-tender, no organomegaly, + bowel sounds Ext - No edema Neuro - Normal strength, cranial nerves intact Skin - No rashes Psych - Normal mood, and behavior   CMP Latest Ref Rng & Units 01/19/2017 12/31/2016 12/16/2016  Glucose 70 - 99 mg/dL 198(H) 231(H) 149(H)  BUN 6 - 23 mg/dL 22 22 27(H)  Creatinine 0.40 - 1.20 mg/dL 0.96 0.95 1.01  Sodium 135 - 145 mEq/L 138 138 139  Potassium 3.5 - 5.1 mEq/L 4.8 5.0 5.7(H)  Chloride 96 - 112 mEq/L 98 98 97  CO2 19 - 32 mEq/L 35(H) 36(H) 37(H)  Calcium 8.4 - 10.5 mg/dL 9.5 9.5 10.0  Total Protein 6.0 - 8.3 g/dL - - -  Total Bilirubin 0.2 - 1.2 mg/dL - - -  Alkaline Phos 39 - 117 U/L - - -  AST 0 - 37 U/L - - -  ALT 0 - 35 U/L - - -     CBC Latest Ref Rng & Units 12/02/2016 11/24/2016 11/23/2016  WBC 4.0 - 10.5 K/uL 6.9 9.2 10.4  Hemoglobin 12.0 - 15.0 g/dL 12.1 12.3 11.7(L)  Hematocrit 36.0 - 46.0 % 37.6 41.3 39.8  Platelets 150.0 - 400.0 K/uL 261.0 172 190     ABG    Component Value Date/Time   PHART 7.204 (L) 11/21/2016 1600   PCO2ART 85.4 (HH) 11/21/2016 1600   PO2ART 73.0 (L) 11/21/2016 1600   HCO3 33.4 (H) 11/23/2016 0154   TCO2 23 12/05/2010 0945   ACIDBASEDEF 2.0 12/05/2010 0945   O2SAT 83.4 11/23/2016 0154    Dg Chest 2 View  Result Date: 01/19/2017 CLINICAL DATA:  Hypoxia EXAM: CHEST  2 VIEW COMPARISON:  12/31/2016 FINDINGS: Mild right basilar airspace disease likely reflecting atelectasis. Small area of linear airspace disease in the left mid lung  likely reflecting atelectasis. No pleural effusion or pneumothorax. Stable cardiomediastinal silhouette. Prior CABG. No acute osseous abnormality. IMPRESSION: No active cardiopulmonary disease. Electronically Signed   By: Kathreen Devoid   On: 01/19/2017 15:09    Discussion She had chronic respiratory failure with hypoxia and hypercapnia that has become apparent after rib fracture complicated by pneumonia.  She has significant kyphoscoliosis, and I suspect her respiratory issues are related to restrictive lung disease.  Assessment/plan  Restrictive lung disease with kyphoscoliosis. - will get PFT and CT chest with contrast  Chronic hypoxic, hypercapnic respiratory failure - continue 2 liters oxygen - will arrange for ONO with 2 liters oxygen - depending on findings will determine if she needs further assessment for nocturnal assisted ventilation   Patient Instructions  Will arrange for CT chest with contrast, pulmonary function test, and overnight oxygen test with you using 2 liters oxygen  Follow up in 4 weeks with Dr. Halford Chessman or Nurse Practitioner   Chesley Mires, MD [place] Pager:  [phone] 01/27/2017, 12:50 PM

## 2017-01-27 NOTE — Progress Notes (Signed)
   Subjective:    Patient ID: [name], female    DOB: June 18, 1935, 82 y.o.   MRN: [id number]  HPI    Review of Systems  Constitutional: Negative for fever and unexpected weight change.  HENT: Positive for postnasal drip, rhinorrhea and sneezing. Negative for congestion, dental problem, ear pain, nosebleeds, sinus pressure, sore throat and trouble swallowing.   Eyes: Negative for redness and itching.  Respiratory: Negative for cough, chest tightness, shortness of breath and wheezing.   Cardiovascular: Positive for leg swelling. Negative for palpitations.  Gastrointestinal: Negative for nausea and vomiting.  Genitourinary: Negative for dysuria.  Musculoskeletal: Negative for joint swelling.  Skin: Negative for rash.  Neurological: Negative for headaches.  Hematological: Does not bruise/bleed easily.  Psychiatric/Behavioral: Negative for dysphoric mood. The patient is not nervous/anxious.        Objective:   Physical Exam        Assessment & Plan:

## 2017-01-27 NOTE — Patient Instructions (Signed)
Will arrange for CT chest with contrast, pulmonary function test, and overnight oxygen test with you using 2 liters oxygen  Follow up in 4 weeks with Dr. Halford Chessman or Nurse Practitioner

## 2017-02-03 ENCOUNTER — Encounter: Payer: Self-pay | Admitting: Pulmonary Disease

## 2017-02-03 DIAGNOSIS — I5043 Acute on chronic combined systolic (congestive) and diastolic (congestive) heart failure: Secondary | ICD-10-CM | POA: Diagnosis not present

## 2017-02-04 ENCOUNTER — Ambulatory Visit (INDEPENDENT_AMBULATORY_CARE_PROVIDER_SITE_OTHER): Payer: Medicare Other | Admitting: Pulmonary Disease

## 2017-02-04 DIAGNOSIS — J9611 Chronic respiratory failure with hypoxia: Secondary | ICD-10-CM

## 2017-02-04 DIAGNOSIS — J984 Other disorders of lung: Secondary | ICD-10-CM | POA: Diagnosis not present

## 2017-02-04 DIAGNOSIS — M419 Scoliosis, unspecified: Secondary | ICD-10-CM

## 2017-02-04 DIAGNOSIS — J9612 Chronic respiratory failure with hypercapnia: Secondary | ICD-10-CM

## 2017-02-04 DIAGNOSIS — I5043 Acute on chronic combined systolic (congestive) and diastolic (congestive) heart failure: Secondary | ICD-10-CM | POA: Diagnosis not present

## 2017-02-04 NOTE — Progress Notes (Signed)
PFT done today. 

## 2017-02-05 ENCOUNTER — Ambulatory Visit (INDEPENDENT_AMBULATORY_CARE_PROVIDER_SITE_OTHER)
Admission: RE | Admit: 2017-02-05 | Discharge: 2017-02-05 | Disposition: A | Discharge status: Home / Self Care | Payer: Medicare Other | Source: Ambulatory Visit | Attending: Pulmonary Disease | Admitting: Pulmonary Disease

## 2017-02-05 ENCOUNTER — Encounter: Payer: Self-pay | Admitting: Radiology

## 2017-02-05 DIAGNOSIS — J9612 Chronic respiratory failure with hypercapnia: Secondary | ICD-10-CM

## 2017-02-05 DIAGNOSIS — R918 Other nonspecific abnormal finding of lung field: Secondary | ICD-10-CM | POA: Diagnosis not present

## 2017-02-05 DIAGNOSIS — M419 Scoliosis, unspecified: Principal | ICD-10-CM

## 2017-02-05 DIAGNOSIS — J9611 Chronic respiratory failure with hypoxia: Secondary | ICD-10-CM

## 2017-02-05 DIAGNOSIS — Z87891 Personal history of nicotine dependence: Secondary | ICD-10-CM | POA: Diagnosis not present

## 2017-02-05 DIAGNOSIS — J984 Other disorders of lung: Secondary | ICD-10-CM

## 2017-02-05 MED ORDER — IOPAMIDOL (ISOVUE-300) INJECTION 61%
75.0000 mL | Freq: Once | INTRAVENOUS | Status: AC | PRN
Start: 2017-02-05 — End: 2017-02-05
  Administered 2017-02-05: 75 mL via INTRAVENOUS

## 2017-02-09 ENCOUNTER — Telehealth: Payer: Self-pay | Admitting: Pulmonary Disease

## 2017-02-09 DIAGNOSIS — G4730 Sleep apnea, unspecified: Secondary | ICD-10-CM

## 2017-02-09 NOTE — Telephone Encounter (Signed)
ONO with 2 liters 02/04/17 >> test time 7 hrs 52 min.  Average SpO2 95%, low SpO2 83%.  Spent 5 min with SpO2 < 88%.  CT chest 02/05/17 >> atherosclerosis, s/p CABG, mild pleural thickening b/l, 4 mm RLL nodule, healed rib fx's, kyphosis, 2.6 cm low density lesion in pancreatic tail   Results d/w pt over the phone.  Will arrange for in lab sleep study to determine if she needs PAP therapy in addition to supplemental oxygen at night.  Will route CT results to her PCP to address findings of lesion in pancreas.  Will discuss PFT results with pt at [facility] on5/23/18.

## 2017-02-10 ENCOUNTER — Ambulatory Visit: Payer: Medicare Other | Admitting: Internal Medicine

## 2017-02-15 LAB — PULMONARY FUNCTION TEST
DL/VA % PRED: 121 %
DL/VA: 5.13 ml/min/mmHg/L
DLCO cor % pred: 49 %
DLCO cor: 9.41 ml/min/mmHg
DLCO unc % pred: 45 %
DLCO unc: 8.56 ml/min/mmHg
FEF 25-75 POST: 0.82 L/s
FEF 25-75 Pre: 0.39 L/sec
FEF2575-%Change-Post: 109 %
FEF2575-%Pred-Post: 67 %
FEF2575-%Pred-Pre: 32 %
FEV1-%Change-Post: 17 %
FEV1-%PRED-PRE: 38 %
FEV1-%Pred-Post: 45 %
FEV1-POST: 0.73 L
FEV1-PRE: 0.62 L
FEV1FVC-%Change-Post: 4 %
FEV1FVC-%PRED-PRE: 99 %
FEV6-%Change-Post: 12 %
FEV6-%PRED-POST: 46 %
FEV6-%PRED-PRE: 41 %
FEV6-POST: 0.94 L
FEV6-Pre: 0.84 L
FEV6FVC-%CHANGE-POST: 0 %
FEV6FVC-%PRED-POST: 106 %
FEV6FVC-%Pred-Pre: 106 %
FVC-%CHANGE-POST: 12 %
FVC-%PRED-POST: 43 %
FVC-%PRED-PRE: 38 %
FVC-Post: 0.94 L
FVC-Pre: 0.84 L
POST FEV6/FVC RATIO: 100 %
PRE FEV1/FVC RATIO: 74 %
Post FEV1/FVC ratio: 77 %
Pre FEV6/FVC Ratio: 100 %
RV % pred: 86 %
RV: 1.89 L
TLC % PRED: 63 %
TLC: 2.84 L

## 2017-02-22 ENCOUNTER — Encounter: Payer: Self-pay | Admitting: Family Medicine

## 2017-02-22 ENCOUNTER — Ambulatory Visit (INDEPENDENT_AMBULATORY_CARE_PROVIDER_SITE_OTHER): Payer: Medicare Other | Admitting: Family Medicine

## 2017-02-22 VITALS — BP 120/76 | HR 64 | Temp 98.5°F | Wt 130.5 lb

## 2017-02-22 DIAGNOSIS — R0902 Hypoxemia: Secondary | ICD-10-CM

## 2017-02-22 DIAGNOSIS — K862 Cyst of pancreas: Secondary | ICD-10-CM | POA: Insufficient documentation

## 2017-02-22 DIAGNOSIS — E1159 Type 2 diabetes mellitus with other circulatory complications: Secondary | ICD-10-CM

## 2017-02-22 DIAGNOSIS — I25708 Atherosclerosis of coronary artery bypass graft(s), unspecified, with other forms of angina pectoris: Secondary | ICD-10-CM

## 2017-02-22 LAB — HEMOGLOBIN A1C: HEMOGLOBIN A1C: 7.7 % — AB (ref 4.6–6.5)

## 2017-02-22 NOTE — Assessment & Plan Note (Signed)
>  25 minutes spent in face to face time with patient, >50% spent in counselling or coordination of care.   D/w pt.  No abd sx.  Reasonable to recheck in ~6 months.   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 Cystic neoplasm is within the differential. Given the patient's age, comorbidities and presumed inability to tolerate MRI, follow-up abdominal CT in 6 months suggested.

## 2017-02-22 NOTE — Progress Notes (Signed)
Diabetes:  Using medications without difficulties: yes Hypoglycemic episodes: she has had lower sugars at night and the goal is to avoid that at all, d/w pt esp re: nighttime snack.   Hyperglycemic episodes:no Feet problems:no Blood Sugars averaging: usually in the AMs ~90, later in the day can get up to 200.  Higher at night, can occ get up to 300 just before bed but by the next AM she is back down to 100 or lower.   eye exam within last year: due, d/w pt.  Has f/u pending.   Due for A1c.    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 Cystic neoplasm is within the differential. Given the patient's age, comorbidities and presumed inability to tolerate MRI, follow-up abdominal CT in 6 months suggested.   Hypoxia.  She has seen Dr. Halford Chessman in the meantime.  I'll defer to pulmonary at this point.  D/w pt.    PMH and SH reviewed  Meds, vitals, and allergies reviewed.   ROS: Per HPI unless specifically indicated in ROS section   GEN: nad, alert and oriented HEENT: mucous membranes moist NECK: supple w/o LA CV: rrr. PULM: ctab, no inc wob ABD: soft, +bs EXT: no edema  Diabetic foot exam: Normal inspection No skin breakdown No calluses  Dec B DP pulses at baseline.  Normal sensation to light touch and monofilament B 1st nails thickened

## 2017-02-22 NOTE — Patient Instructions (Signed)
Your oxygen orders will likely come through pulmonary.  If they need me to address anything then I will.  Recheck abdominal imaging in about 6 months.  No change in meds in the meantime.  Go to the lab on the way out.  We'll contact you with your lab report. Take care.  Glad to see you.  Update me as needed.  Plan on recheck in about 3 months, labs at or before the visit.

## 2017-02-22 NOTE — Assessment & Plan Note (Addendum)
A1c pending.  The goal is to avoid low sugars, d/w pt.  She agrees.  No change in meds at this point.

## 2017-02-22 NOTE — Assessment & Plan Note (Signed)
I'll defer to pulmonary at this point.  D/w pt.

## 2017-02-24 ENCOUNTER — Ambulatory Visit (INDEPENDENT_AMBULATORY_CARE_PROVIDER_SITE_OTHER): Payer: Medicare Other | Admitting: Adult Health

## 2017-02-24 ENCOUNTER — Encounter: Payer: Self-pay | Admitting: Adult Health

## 2017-02-24 DIAGNOSIS — J4540 Moderate persistent asthma, uncomplicated: Secondary | ICD-10-CM | POA: Diagnosis not present

## 2017-02-24 DIAGNOSIS — J45909 Unspecified asthma, uncomplicated: Secondary | ICD-10-CM | POA: Insufficient documentation

## 2017-02-24 DIAGNOSIS — I25708 Atherosclerosis of coronary artery bypass graft(s), unspecified, with other forms of angina pectoris: Secondary | ICD-10-CM | POA: Diagnosis not present

## 2017-02-24 DIAGNOSIS — J9611 Chronic respiratory failure with hypoxia: Secondary | ICD-10-CM | POA: Diagnosis not present

## 2017-02-24 DIAGNOSIS — J9612 Chronic respiratory failure with hypercapnia: Secondary | ICD-10-CM

## 2017-02-24 MED ORDER — BUDESONIDE-FORMOTEROL FUMARATE 80-4.5 MCG/ACT IN AERO: 2.0000 | INHALATION_SPRAY | Freq: Two times a day (BID) | RESPIRATORY_TRACT | 0 refills | Status: DC

## 2017-02-24 MED ORDER — BUDESONIDE-FORMOTEROL FUMARATE 80-4.5 MCG/ACT IN AERO
2.0000 | INHALATION_SPRAY | Freq: Two times a day (BID) | RESPIRATORY_TRACT | 4 refills | Status: DC
Start: 2017-02-24 — End: 2017-03-24

## 2017-02-24 NOTE — Progress Notes (Signed)
@Patient  ID: [name], female    DOB: 12/28/1934, 82 y.o.   MRN: [id number]  Chief Complaint  Patient presents with  . Follow-up    dyspnea     Referring provider: Tonia Ghent, MD  HPI: 82 year old female never smoker seen for pulmonary consult 01/27/2017 for hypoxia and shortness of breath. Patient had a fall in October 2017 with multiple rib fractures. She was admitted with pneumonia in February 2018 and had hypoxic and hypercapnic respiratory failure. And was placed on O2 at 2 L.   TEST  CT chest 02/05/2017 showed mild pleural thickening bilaterally, 4 mm right lower lobe lung nodule, healed rib fractures, kyphosis. And a 2.6 cm low-density lesion in the pancreatic tail. Overnight oximetry test done on 02/04/2017 on O2 at 2l/m . showed showed average SPO2 95%, low SPO2 83%, spent 5 minutes with SPO2 less than 88%.  02/24/2017 Follow up: Hypoxia  Patient returns for a one-month follow-up. Patient was seen last visit for pulmonary consult for persistent hypoxemia. Patient had a fall in October 2017 with multiple rib fractures. She developed subsequent developed a pneumonia and was admitted in February 2018. She had acute hypoxic and hypercarbic rest heart failure. Patient was placed on oxygen at 2 L and has been on that since then. Patient was set up for a CT chest on 02/05/2017 that showed some mild pleural thickening bilaterally with a 4 mm right lower lobe nodule and healed rib fractures with kyphosis. A 2.6 cm low-density lesion was noted in the pancreatic tail. Patient was recommended to follow-up with her primary care doctor regarding this abnormality.(was seen  By PCP and plans for CT abd in 6 months ) .  An overnight oximetry test did show a low SPO2 83% with 5 minutes below 88%. Patient was recommended to set up a sleep study which is been scheduled for 04/13/2017. Patient underwent a primary function test that showed an FEV1 at 45%, ratio 77, FVC 43%, positive  bronchodilator response. DLCO 45%. Total lung capacity 63%.  On ACE inhibitor , denies cough .   Allergies  Allergen Reactions  . Actos [Pioglitazone] Other (See Comments)    edema  . Metformin And Related Other (See Comments)    Held due to renal function  . Zocor [Simvastatin] Other (See Comments)    Myalgias    Immunization History  Administered Date(s) Administered  . Influenza Split 07/09/2011  . Influenza Whole 08/05/2006, 07/28/2007, 07/02/2008  . Influenza,inj,Quad PF,36+ Mos 07/19/2013, 06/25/2014, 08/13/2015, 06/16/2016  . Pneumococcal Conjugate-13 01/03/2015  . Pneumococcal Polysaccharide-23 11/02/2005  . Td 01/10/2001  . Tdap 09/18/2011, 06/16/2016  . Zoster 07/08/2006    Past Medical History:  Diagnosis Date  . Anemia, iron deficiency   . Atrial fibrillation ([place]) 11/28/2010   2D Echo - EF-30-35, left atrium moderate to severely dilated, right ventricle moderately dilated, tricuspid valve mild-moderate regurgitation  . 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 CAP (community acquired pneumonia)   . Chronic kidney disease (CKD), stage II (mild)   . Coronary artery disease   . Diabetes mellitus, type 2 ([place])   . Diverticulitis   . History of breast cancer    Followed yearly by [facility]  . Hyperlipemia   . Hypertension   . Nontraumatic retroperitoneal hematoma   . Osteoporosis   . Peripheral vascular disease ([place])     Tobacco History: History  Smoking Status  . Never Smoker  Smokeless Tobacco  .  Never Used   Counseling given: Not Answered   Outpatient Encounter Prescriptions as of 02/24/2017  Medication Sig  . amLODipine (NORVASC) 10 MG tablet Take 10 mg by mouth daily.  Marland Kitchen aspirin EC 81 MG tablet Take 81 mg by mouth daily.  Marland Kitchen atorvastatin (LIPITOR) 20 MG tablet TAKE 1 TABLET BY MOUTH AT BEDTIME  . benazepril (LOTENSIN) 20 MG tablet Take 1 tablet (20 mg total) by mouth daily.  . Cholecalciferol (VITAMIN D) 1000 UNITS capsule Take 1,000  Units by mouth 2 (two) times daily.    Marland Kitchen glyBURIDE (DIABETA) 5 MG tablet TAKE 2 TABLETS (10 MG TOTAL) BY MOUTH DAILY WITH BREAKFAST.  Marland Kitchen Insulin Syringe-Needle U-100 (BD INSULIN SYRINGE ULTRAFINE) 31G X 5/16" 0.3 ML MISC USE DAILY AND AS DIRECTED SLIDING SCALE.  Multiple injections of insulin per day  . meclizine (ANTIVERT) 25 MG tablet Take 1 tablet (25 mg total) by mouth daily as needed. For dizziness  . nitroGLYCERIN (NITROSTAT) 0.4 MG SL tablet Place 1 tablet (0.4 mg total) under the tongue as needed for chest pain. As directed  . NON FORMULARY Oxygen 2 liters 24/7  . NOVOLOG 100 UNIT/ML injection USE PER SLIDING SCALE--200-250=4-5 UNITS, 251-300=5-6 UNITS,301-350=6-7 UNITS, WITH MEALS  . nystatin (MYCOSTATIN/NYSTOP) 100000 UNIT/GM POWD Apply up to 3 times a day if needed.  . ONE TOUCH ULTRA TEST test strip USE TO CHECK BLOOD SUGAR 4 TIMES A DAY. INSULIN-DEPENDENT. DIAGNOSIS: E11.9  . Polyvinyl Alcohol-Povidone (REFRESH OP) Place 1 drop into both eyes daily as needed.  Marland Kitchen RANEXA 500 MG 12 hr tablet TAKE 1 TABLET (500 MG TOTAL) BY MOUTH 2 (TWO) TIMES DAILY. NEED APPOINTMENT FOR MORE REFILLS  . sotalol (BETAPACE) 80 MG tablet Take 0.5 tablets (40 mg total) by mouth 2 (two) times daily.   No facility-administered encounter medications on file as of 02/24/2017.      Review of Systems  Constitutional:   No  weight loss, night sweats,  Fevers, chills, + fatigue, or  lassitude.  HEENT:   No headaches,  Difficulty swallowing,  Tooth/dental problems, or  Sore throat,                No sneezing, itching, ear ache, nasal congestion, post nasal drip,   CV:  No chest pain,  Orthopnea, PND, swelling in lower extremities, anasarca, dizziness, palpitations, syncope.   GI  No heartburn, indigestion, abdominal pain, nausea, vomiting, diarrhea, change in bowel habits, loss of appetite, bloody stools.   Resp:   No excess mucus, no productive cough,  No non-productive cough,  No coughing up of blood.  No  change in color of mucus.  No wheezing.  No chest wall deformity  Skin: no rash or lesions.  GU: no dysuria, change in color of urine, no urgency or frequency.  No flank pain, no hematuria   MS:  No joint pain or swelling.  No decreased range of motion.  No back pain.    Physical Exam  BP 128/70 (BP Location: Left Arm, Cuff Size: Normal)   Pulse 78   Ht 5' 3.5" (1.613 m)   Wt 130 lb 12.8 oz (59.3 kg)   SpO2 97%   BMI 22.81 kg/m   GEN: A/Ox3; pleasant , NAD, elderly on O2    HEENT:  [place]/AT,  EACs-clear, TMs-wnl, NOSE-clear, THROAT-clear, no lesions, no postnasal drip or exudate noted.   NECK:  Supple w/ fair ROM; no JVD; normal carotid impulses w/o bruits; no thyromegaly or nodules palpated; no lymphadenopathy.  RESP  Clear  P & A; w/o, wheezes/ rales/ or rhonchi. no accessory muscle use, no dullness to percussion  CARD:  RRR, no m/r/g, no peripheral edema, pulses intact, no cyanosis or clubbing.  GI:   Soft & nt; nml bowel sounds; no organomegaly or masses detected.   Musco: Warm bil, no deformities or joint swelling noted.   Neuro: alert, no focal deficits noted.    Skin: Warm, no lesions or rashes    Lab Results:  CBC  BMET  Imaging: Ct Chest W Contrast  Result Date: 02/05/2017 CLINICAL DATA:  Persistent shortness of breath after falling in October and suffering rib fractures. Subsequent pneumonia. EXAM: CT CHEST WITH CONTRAST TECHNIQUE: Multidetector CT imaging of the chest was performed during intravenous contrast administration. CONTRAST:  63mL ISOVUE-300 IOPAMIDOL (ISOVUE-300) INJECTION 61% COMPARISON:  Radiographs 12/31/2016 and 01/19/2017. Chest CT 11/30/2003. Abdominal CT 10/25/2014. FINDINGS: Cardiovascular: Diffuse atherosclerosis of aorta, great vessels and coronary arteries status post median sternotomy and CABG. No acute vascular findings are seen. The heart size is normal. There is no pericardial effusion. Mediastinum/Nodes: There are no enlarged  mediastinal, hilar or axillary lymph nodes.Small mediastinal and hilar lymph nodes are stable. There is an 11 mm low-density left thyroid nodule on image 39. The esophagus appears unremarkable. Lungs/Pleura: There is no pleural effusion. There is mild pleural thickening bilaterally. No pneumothorax. There are areas of subpleural atelectasis or scarring in both lungs. There is some nodularity in the lingula with a tree-in-bud distribution, likely inflammatory. This is seen on axial images 62 through 74. There is a 4 mm right lower lobe nodule on image 63 which is stable. No suspicious pulmonary nodule, endobronchial lesion or confluent airspace opacity. Upper abdomen: There is an enlarging low-density lesion in the pancreatic tail, measuring 2.6 x 1.2 cm on image 122. There is a small associated calcification which appears unchanged. The remainder of the visualized upper abdomen appears unremarkable. Musculoskeletal/Chest wall: There is no chest wall mass or suspicious osseous finding. There is a stable hemangioma within the mid thoracic spine. Multiple healed rib fractures are present on the left. There is an exaggerated cervicothoracic kyphosis. Degenerative changes are present at both shoulders with subacromial space narrowing (especially on the right), suspicious for chronic rotator cuff tear. IMPRESSION: 1. No acute findings or clear explanation for hypoxia. No evidence of pneumonia. 2. Scattered mild pulmonary nodularity, some with a tree-in-bud distribution, likely inflammatory. This could indicate mild atypical mycobacterial infection. 3. Diffuse atherosclerosis post CABG. 4. Pleural thickening on the left related to old healed rib fractures. 5. Small low-density left thyroid nodule, unlikely to be clinically significant. 6. 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 Cystic neoplasm is within the differential. Given the patient's age, comorbidities and  presumed inability to tolerate MRI, follow-up abdominal CT in 6 months suggested. This recommendation follows ACR consensus guidelines: Management of Incidental Pancreatic Cysts: A White Paper of the ACR Incidental Findings Committee. [place] [id number]. Electronically Signed   By: Richardean Sale M.D.   On: 02/05/2017 15:50     Assessment & Plan:   Asthma Moderate obstruction/restriction on PFT w/ signficiant BD response.  Trial of Symbicort (lower dose )   Plan  Patient Instructions  Begin Symbicort 80/4.5cmg 1 puffs Twice daily  , rinse after use.  Continue on Oxygen 2lm/ and At bedtime   Go for sleep study in July as planned.  Follow up with Dr. Halford Chessman  In 6-8 weeks and As needed  Please contact office for sooner follow up if symptoms do not improve or worsen or seek emergency care     Chronic respiratory failure with hypoxia ([place]) Cont on O2 w/ act and .At bedtime   Go for sleep study .      Rexene Edison, NP 02/24/2017

## 2017-02-24 NOTE — Addendum Note (Signed)
Addended by: Valerie Salts on: 02/24/2017 12:22 PM   Modules accepted: Orders

## 2017-02-24 NOTE — Assessment & Plan Note (Signed)
Cont on O2 w/ act and .At bedtime   Go for sleep study .

## 2017-02-24 NOTE — Patient Instructions (Addendum)
Begin Symbicort 80/4.5cmg 1 puffs Twice daily  , rinse after use.  Continue on Oxygen 2lm/ and At bedtime   Go for sleep study in July as planned.  Follow up with Dr. Halford Chessman  In 6-8 weeks and As needed   Please contact office for sooner follow up if symptoms do not improve or worsen or seek emergency care

## 2017-02-24 NOTE — Progress Notes (Signed)
I have reviewed and agree with assessment/plan.  Chesley Mires, MD [facility] Pulmonary/Critical Care 02/24/2017, 12:37 PM Pager:  [phone]

## 2017-02-24 NOTE — Assessment & Plan Note (Signed)
Moderate obstruction/restriction on PFT w/ signficiant BD response.  Trial of Symbicort (lower dose )   Plan  Patient Instructions  Begin Symbicort 80/4.5cmg 1 puffs Twice daily  , rinse after use.  Continue on Oxygen 2lm/ and At bedtime   Go for sleep study in July as planned.  Follow up with Dr. Halford Chessman  In 6-8 weeks and As needed   Please contact office for sooner follow up if symptoms do not improve or worsen or seek emergency care

## 2017-03-08 DIAGNOSIS — Z961 Presence of intraocular lens: Secondary | ICD-10-CM | POA: Diagnosis not present

## 2017-03-08 LAB — HM DIABETES EYE EXAM

## 2017-03-10 DIAGNOSIS — D2261 Melanocytic nevi of right upper limb, including shoulder: Secondary | ICD-10-CM | POA: Diagnosis not present

## 2017-03-10 DIAGNOSIS — B078 Other viral warts: Secondary | ICD-10-CM | POA: Diagnosis not present

## 2017-03-10 DIAGNOSIS — D225 Melanocytic nevi of trunk: Secondary | ICD-10-CM | POA: Diagnosis not present

## 2017-03-10 DIAGNOSIS — D2272 Melanocytic nevi of left lower limb, including hip: Secondary | ICD-10-CM | POA: Diagnosis not present

## 2017-03-17 ENCOUNTER — Telehealth: Payer: Self-pay | Admitting: Pulmonary Disease

## 2017-03-17 ENCOUNTER — Telehealth: Payer: Self-pay | Admitting: Cardiovascular Disease

## 2017-03-17 NOTE — Telephone Encounter (Signed)
Pt states that her Cardiologist is requesting that she stop her Symbicort - started on 02/24/17 by TP.  Pt states that she was advised that it causes prolongation of QT waves.  Pt advised to stop Symbicort now.   Please advise Dr Halford Chessman of alternatives. Pt okay with waiting until 03/18/17 for a response as Dr Halford Chessman is working Risk analyst.

## 2017-03-17 NOTE — Telephone Encounter (Signed)
New message   Pt c/o medication issue:  1. Name of Medication: symbacort  2. How are you currently taking this medication (dosage and times per day)? Just a spray 80 mcg  3. Are you having a reaction (difficulty breathing--STAT)? No, just concerned   4. What is your medication issue? Pt states the medication she has been taking states that it is affected her QT and she was told by Dr. Loletha Grayer that she should avoid medications that affect her Qt. Requests a call back

## 2017-03-17 NOTE — Telephone Encounter (Signed)
Spoke with pt, aware due to her hx of prolonged QT, it is preferred she not use the Symbicort. She will follow up with the pulmonary office,

## 2017-03-18 MED ORDER — FLUTICASONE PROPIONATE HFA 44 MCG/ACT IN AERO: 2.0000 | INHALATION_SPRAY | Freq: Two times a day (BID) | RESPIRATORY_TRACT | 5 refills | Status: DC

## 2017-03-18 NOTE — Telephone Encounter (Signed)
Can change to flovent 44 mcg two puffs bid, and d/c symbicort.

## 2017-03-18 NOTE — Telephone Encounter (Signed)
Spoke with the pt and notified of recs per VS  She verbalized understanding Rx was sent to pharm   

## 2017-03-24 ENCOUNTER — Encounter: Payer: Self-pay | Admitting: Adult Health

## 2017-03-24 ENCOUNTER — Ambulatory Visit (INDEPENDENT_AMBULATORY_CARE_PROVIDER_SITE_OTHER): Payer: Medicare Other | Admitting: Adult Health

## 2017-03-24 DIAGNOSIS — J9611 Chronic respiratory failure with hypoxia: Secondary | ICD-10-CM

## 2017-03-24 DIAGNOSIS — I25708 Atherosclerosis of coronary artery bypass graft(s), unspecified, with other forms of angina pectoris: Secondary | ICD-10-CM | POA: Diagnosis not present

## 2017-03-24 DIAGNOSIS — J4540 Moderate persistent asthma, uncomplicated: Secondary | ICD-10-CM

## 2017-03-24 NOTE — Addendum Note (Signed)
Addended by: Parke Poisson E on: 03/24/2017 02:57 PM   Modules accepted: Orders

## 2017-03-24 NOTE — Assessment & Plan Note (Signed)
Doing well on Flovent  Unable to take Symbicort per pt /Cardiology   Plan  . Patient Instructions  Continue on Flovent 2 puffs Twice daily, rinse after use.  Continue on Oxygen 2lm/  With activity and At bedtime   Order for POC oxygen device  Go for sleep study in July as planned.  Follow up with Dr. Halford Chessman  In 4 weeks  and As needed   Please contact office for sooner follow up if symptoms do not improve or worsen or seek emergency care

## 2017-03-24 NOTE — Assessment & Plan Note (Signed)
Cont on O2  Sleep study as planned.

## 2017-03-24 NOTE — Progress Notes (Signed)
I have reviewed and agree with assessment/plan.  Chesley Mires, MD [facility] Pulmonary/Critical Care 03/24/2017, 1:19 PM Pager:  [phone]

## 2017-03-24 NOTE — Patient Instructions (Addendum)
Continue on Flovent 2 puffs Twice daily, rinse after use.  Continue on Oxygen 2lm/  With activity and At bedtime   Order for POC oxygen device  Go for sleep study in July as planned.  Follow up with Dr. Halford Chessman  In 4 weeks  and As needed   Please contact office for sooner follow up if symptoms do not improve or worsen or seek emergency care

## 2017-03-24 NOTE — Progress Notes (Signed)
@Patient  ID: [name], female    DOB: Aug 03, 1935, 82 y.o.   MRN: [id number]  Chief Complaint  Patient presents with  . Follow-up    4 we f/u, cardiologist changerd her Symbicort to Flovent, on 2L O2 today, pt reports she is doing well    Referring provider: Tonia Ghent, MD  HPI: 82 year old female never smoker seen for pulmonary consult 01/27/2017 for hypoxia and shortness of breath. Patient had a fall in October 2017 with multiple rib fractures. She was admitted with pneumonia in February 2018 and had hypoxic and hypercapnic respiratory failure. And was placed on O2 at 2 L.  TEST  CT chest 02/05/2017 showed mild pleural thickening bilaterally, 4 mm right lower lobe lung nodule, healed rib fractures, kyphosis. And a 2.6 cm low-density lesion in the pancreatic tail. Overnight oximetry test done on 02/04/2017 on O2 at 2l/m . showed showed average SPO2 95%, low SPO2 83%, spent 5 minutes with SPO2 less than 88%. PFT that showed an FEV1 at 45%, ratio 77, FVC 43%, positive bronchodilator response. DLCO 45%. Total lung capacity 63%.   03/24/2017 Follow up : Asthma /O2 RF  Patient presents for a one-month follow-up. Patient was seen last visit with pulmonary function test showing possible asthma with moderate obstruction and restriction on PFT with a significant bronchodilator response. Patient was started on Symbicort. Unfortunately, patient says she was unable to take this. She talk to her cardiologist who recommended that she not take this due to her QT syndrome. She was changed over to [employer]. Patient says her breathing is doing well. She denies flare cough or wheezing. She continues on oxygen at 2 L with activity and at bedtime. Patient does have an upcoming sleep study that is scheduled in a couple weeks.    Allergies  Allergen Reactions  . Actos [Pioglitazone] Other (See Comments)    edema  . Metformin And Related Other (See Comments)    Held due to renal function  .  Zocor [Simvastatin] Other (See Comments)    Myalgias    Immunization History  Administered Date(s) Administered  . Influenza Split 07/09/2011  . Influenza Whole 08/05/2006, 07/28/2007, 07/02/2008  . Influenza,inj,Quad PF,36+ Mos 07/19/2013, 06/25/2014, 08/13/2015, 06/16/2016  . Pneumococcal Conjugate-13 01/03/2015  . Pneumococcal Polysaccharide-23 11/02/2005  . Td 01/10/2001  . Tdap 09/18/2011, 06/16/2016  . Zoster 07/08/2006    Past Medical History:  Diagnosis Date  . Anemia, iron deficiency   . Atrial fibrillation ([place]) 11/28/2010   2D Echo - EF-30-35, left atrium moderate to severely dilated, right ventricle moderately dilated, tricuspid valve mild-moderate regurgitation  . 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 CAP (community acquired pneumonia)   . Chronic kidney disease (CKD), stage II (mild)   . Coronary artery disease   . Diabetes mellitus, type 2 ([place])   . Diverticulitis   . History of breast cancer    Followed yearly by [facility]  . Hyperlipemia   . Hypertension   . Nontraumatic retroperitoneal hematoma   . Osteoporosis   . Peripheral vascular disease ([place])     Tobacco History: History  Smoking Status  . Never Smoker  Smokeless Tobacco  . Never Used   Counseling given: Not Answered   Outpatient Encounter Prescriptions as of 03/24/2017  Medication Sig  . amLODipine (NORVASC) 10 MG tablet Take 10 mg by mouth daily.  Marland Kitchen aspirin EC 81 MG tablet Take 81 mg by mouth daily.  Marland Kitchen atorvastatin (LIPITOR) 20  MG tablet TAKE 1 TABLET BY MOUTH AT BEDTIME  . benazepril (LOTENSIN) 20 MG tablet Take 1 tablet (20 mg total) by mouth daily.  . Cholecalciferol (VITAMIN D) 1000 UNITS capsule Take 1,000 Units by mouth 2 (two) times daily.    . fluticasone (FLOVENT HFA) 44 MCG/ACT inhaler Inhale 2 puffs into the lungs 2 (two) times daily.  Marland Kitchen glyBURIDE (DIABETA) 5 MG tablet TAKE 2 TABLETS (10 MG TOTAL) BY MOUTH DAILY WITH BREAKFAST.  Marland Kitchen Insulin Syringe-Needle U-100 (BD  INSULIN SYRINGE ULTRAFINE) 31G X 5/16" 0.3 ML MISC USE DAILY AND AS DIRECTED SLIDING SCALE.  Multiple injections of insulin per day  . meclizine (ANTIVERT) 25 MG tablet Take 1 tablet (25 mg total) by mouth daily as needed. For dizziness  . nitroGLYCERIN (NITROSTAT) 0.4 MG SL tablet Place 1 tablet (0.4 mg total) under the tongue as needed for chest pain. As directed  . NON FORMULARY Oxygen 2 liters 24/7  . NOVOLOG 100 UNIT/ML injection USE PER SLIDING SCALE--200-250=4-5 UNITS, 251-300=5-6 UNITS,301-350=6-7 UNITS, WITH MEALS  . nystatin (MYCOSTATIN/NYSTOP) 100000 UNIT/GM POWD Apply up to 3 times a day if needed.  . ONE TOUCH ULTRA TEST test strip USE TO CHECK BLOOD SUGAR 4 TIMES A DAY. INSULIN-DEPENDENT. DIAGNOSIS: E11.9  . Polyvinyl Alcohol-Povidone (REFRESH OP) Place 1 drop into both eyes daily as needed.  Marland Kitchen RANEXA 500 MG 12 hr tablet TAKE 1 TABLET (500 MG TOTAL) BY MOUTH 2 (TWO) TIMES DAILY. NEED APPOINTMENT FOR MORE REFILLS  . sotalol (BETAPACE) 80 MG tablet Take 0.5 tablets (40 mg total) by mouth 2 (two) times daily.  . [DISCONTINUED] budesonide-formoterol (SYMBICORT) 80-4.5 MCG/ACT inhaler Inhale 2 puffs into the lungs 2 (two) times daily.  . [DISCONTINUED] budesonide-formoterol (SYMBICORT) 80-4.5 MCG/ACT inhaler Inhale 2 puffs into the lungs 2 (two) times daily.   No facility-administered encounter medications on file as of 03/24/2017.      Review of Systems  Constitutional:   No  weight loss, night sweats,  Fevers, chills, fatigue, or  lassitude.  HEENT:   No headaches,  Difficulty swallowing,  Tooth/dental problems, or  Sore throat,                No sneezing, itching, ear ache, nasal congestion, post nasal drip,   CV:  No chest pain,  Orthopnea, PND, swelling in lower extremities, anasarca, dizziness, palpitations, syncope.   GI  No heartburn, indigestion, abdominal pain, nausea, vomiting, diarrhea, change in bowel habits, loss of appetite, bloody stools.   Resp: No shortness of  breath with exertion or at rest.  No excess mucus, no productive cough,  No non-productive cough,  No coughing up of blood.  No change in color of mucus.  No wheezing.  No chest wall deformity  Skin: no rash or lesions.  GU: no dysuria, change in color of urine, no urgency or frequency.  No flank pain, no hematuria   MS:  No joint pain or swelling.  No decreased range of motion.  No back pain.    Physical Exam  BP 124/60 (BP Location: Left Arm, Cuff Size: Normal)   Pulse 64   Ht 5' 3.5" (1.613 m)   Wt 129 lb (58.5 kg)   SpO2 98%   BMI 22.49 kg/m   GEN: A/Ox3; pleasant , NAD, [place] on O2    HEENT:  [place]/AT,  EACs-clear, TMs-wnl, NOSE-clear, THROAT-clear, no lesions, no postnasal drip or exudate noted.   NECK:  Supple w/ fair ROM; no JVD; normal carotid impulses w/o bruits;  no thyromegaly or nodules palpated; no lymphadenopathy.    RESP  Clear  P & A; w/o, wheezes/ rales/ or rhonchi. no accessory muscle use, no dullness to percussion  CARD:  RRR, no m/r/g, no peripheral edema, pulses intact, no cyanosis or clubbing.  GI:   Soft & nt; nml bowel sounds; no organomegaly or masses detected.   Musco: Warm bil, no deformities or joint swelling noted.   Neuro: alert, no focal deficits noted.    Skin: Warm, no lesions or rashes    Lab Results:  CBC  BNP No results found for: BNP   Imaging: No results found.   Assessment & Plan:   Asthma Doing well on Flovent  Unable to take Symbicort per pt /Cardiology   Plan  . Patient Instructions  Continue on Flovent 2 puffs Twice daily, rinse after use.  Continue on Oxygen 2lm/  With activity and At bedtime   Order for POC oxygen device  Go for sleep study in July as planned.  Follow up with Dr. Halford Chessman  In 4 weeks  and As needed   Please contact office for sooner follow up if symptoms do not improve or worsen or seek emergency care      Chronic respiratory failure with hypoxia ([place]) Cont on O2  Sleep study as planned.        Rexene Edison, NP 03/24/2017

## 2017-03-25 ENCOUNTER — Encounter: Payer: Self-pay | Admitting: Family Medicine

## 2017-04-13 ENCOUNTER — Ambulatory Visit (HOSPITAL_BASED_OUTPATIENT_CLINIC_OR_DEPARTMENT_OTHER): Payer: Medicare Other | Attending: Pulmonary Disease | Admitting: Pulmonary Disease

## 2017-04-13 DIAGNOSIS — J984 Other disorders of lung: Secondary | ICD-10-CM | POA: Diagnosis not present

## 2017-04-13 DIAGNOSIS — J9611 Chronic respiratory failure with hypoxia: Secondary | ICD-10-CM

## 2017-04-13 DIAGNOSIS — G4733 Obstructive sleep apnea (adult) (pediatric): Secondary | ICD-10-CM | POA: Diagnosis not present

## 2017-04-13 DIAGNOSIS — M419 Scoliosis, unspecified: Secondary | ICD-10-CM

## 2017-04-13 DIAGNOSIS — G4730 Sleep apnea, unspecified: Secondary | ICD-10-CM

## 2017-04-13 DIAGNOSIS — J9612 Chronic respiratory failure with hypercapnia: Secondary | ICD-10-CM | POA: Diagnosis not present

## 2017-04-14 ENCOUNTER — Telehealth: Payer: Self-pay | Admitting: Pulmonary Disease

## 2017-04-14 DIAGNOSIS — J984 Other disorders of lung: Secondary | ICD-10-CM | POA: Insufficient documentation

## 2017-04-14 DIAGNOSIS — G4733 Obstructive sleep apnea (adult) (pediatric): Secondary | ICD-10-CM

## 2017-04-14 DIAGNOSIS — M419 Scoliosis, unspecified: Secondary | ICD-10-CM

## 2017-04-14 DIAGNOSIS — G4730 Sleep apnea, unspecified: Secondary | ICD-10-CM | POA: Insufficient documentation

## 2017-04-14 NOTE — Telephone Encounter (Signed)
PSG 04/13/17 >> RDI 33.9.  Test done with 2 liters oxygen.   Will have my nurse inform pt that she has severe sleep apnea.  She needs to have in lab titration study to determine optimal set up.  If she is agreeable to this, then please arrange for CPAP titration study in sleep lab.  Please enter following into study instructions: The study should be started on CPAP without using supplemental oxygen, and then transition to Bipap +/- supplemental oxygen as needed.

## 2017-04-14 NOTE — Procedures (Signed)
   Patient Name: [name], [name] Date: 04/13/2017 Gender: Female D.O.B: October 27, 1934 Age (years): 70 Referring Provider: Chesley Mires MD, ABSM Height (inches): 34 Interpreting Physician: Chesley Mires MD, ABSM Weight (lbs): 128 RPSGT: Zadie Rhine BMI: 23 MRN: [id number] Neck Size: 15.50  CLINICAL INFORMATION Sleep Study Type: NPSG  Indication for sleep study: 82 yo female with restrictive lung disease secondary to kyphoscoliosis and chronic hypoxic and hypercapnic respiratory failure.  She presents to the sleep lab for evaluation of sleep disordered breathing.  Epworth Sleepiness Score:  SLEEP STUDY TECHNIQUE As per the AASM Manual for the Scoring of Sleep and Associated Events v2.3 (April 2016) with a hypopnea requiring 4% desaturations.  The channels recorded and monitored were frontal, central and occipital EEG, electrooculogram (EOG), submentalis EMG (chin), nasal and oral airflow, thoracic and abdominal wall motion, anterior tibialis EMG, snore microphone, electrocardiogram, and pulse oximetry.  MEDICATIONS Medications self-administered by patient taken the night of the study : SOTALOL, RANEXA, LIPITOR  SLEEP ARCHITECTURE The study was initiated at 10:24:10 PM and ended at 4:31:10 AM.  Sleep onset time was 68.2 minutes and the sleep efficiency was 61.7%. The total sleep time was 226.5 minutes.  Stage REM latency was N/A minutes.  The patient spent 3.97% of the night in stage N1 sleep, 94.48% in stage N2 sleep, 1.55% in stage N3 and 0.00% in REM.  Alpha intrusion was absent.  Supine sleep was 100.00%.  RESPIRATORY PARAMETERS The overall respiratory disturbance index (RDI) was 33.9 per hour. There were 6 total apneas, including 6 obstructive, 0 central and 0 mixed apneas. There were 0 hypopneas and 122 RERAs.  The AHI during Stage REM sleep was N/A per hour.  AHI while supine was 1.6 per hour.  The mean oxygen saturation was 98.79%. The minimum SpO2 during sleep  was 96.00%.  The study was conducted with the patient using her baseline 2 liters supplemental oxygen.  Soft snoring was noted during this study.  CARDIAC DATA The 2 lead EKG demonstrated sinus rhythm. The mean heart rate was 54.72 beats per minute. Other EKG findings include: PVCs.  LEG MOVEMENT DATA The total PLMS were 0 with a resulting PLMS index of 0.00. Associated arousal with leg movement index was 0.0 .  IMPRESSIONS - This study showed severe sleep apnea with a respiratory disturbance index of 33.9. - The study was conducted with the patient using her baseline 2 liters oxygen.  DIAGNOSIS - Sleep apnea, unspecified (G47.30) - Restrictive lung disease (J98.4, M41.9) - Chronic respiratory failure with hypoxia and hypercapnia (J96.11, J96.12)  RECOMMENDATIONS - She should return to the sleep lab for a titration study.  The study should be started on CPAP without using supplemental oxygen, and then transition to Bipap +/- supplemental oxygen as needed.  [Electronically signed] 04/14/2017 10:36 AM  Chesley Mires MD, ABSM Diplomate, American Board of Sleep Medicine   NPI: [id number]

## 2017-04-20 NOTE — Telephone Encounter (Signed)
Patient returned call, CB is [phone].   Also, I looked at rescheduling this appointment for the patient but Dr. Juanetta Gosling first avail is 07/2017, nothing avail in August.

## 2017-04-20 NOTE — Telephone Encounter (Signed)
LM for results Need to reschedule appt 04/21/17 per Dr Halford Chessman until she has Titration study -- move 1 month out

## 2017-04-20 NOTE — Telephone Encounter (Signed)
Spoke with patient. She is aware of VS recs. She was hesitant at first about the CPAP test but finally agreed to do so. Will place the order. She is aware. Appt for tomorrow has been cancelled.

## 2017-04-21 ENCOUNTER — Ambulatory Visit: Payer: Medicare Other | Admitting: Pulmonary Disease

## 2017-04-21 ENCOUNTER — Telehealth: Payer: Self-pay | Admitting: Pulmonary Disease

## 2017-04-21 NOTE — Telephone Encounter (Signed)
Okay to schedule ROV with NP.  However, I usually have patient called with sleep study results and then arrange for follow up at that time.

## 2017-04-21 NOTE — Telephone Encounter (Signed)
atc pt X2, line rang to fast busy signal.  Wcb.  

## 2017-04-21 NOTE — Telephone Encounter (Signed)
Spoke with pt, who states she is scheduled for titration on 8/82/18. Pt's appointment with VS for 04/21/17 @ 11:30 was canceled, until after titration. Pt returned call to reschedule appt. First available with VS is 07/07/17. Pt prefers to not wait that long after study to see VS. Pt agreed to see NP after study okay with VS.  VS please advise if okay to schedule with NP . Thanks.

## 2017-04-22 NOTE — Telephone Encounter (Signed)
Called and spoke with pt and she is aware of VS recs.  She stated that she will do the sleep study and wait for [place] to call with the results.  She will decide about an appt at that time.

## 2017-05-07 ENCOUNTER — Other Ambulatory Visit: Payer: Self-pay | Admitting: Family Medicine

## 2017-05-18 ENCOUNTER — Other Ambulatory Visit (INDEPENDENT_AMBULATORY_CARE_PROVIDER_SITE_OTHER): Payer: Medicare Other

## 2017-05-18 DIAGNOSIS — J189 Pneumonia, unspecified organism: Secondary | ICD-10-CM

## 2017-05-18 DIAGNOSIS — E1159 Type 2 diabetes mellitus with other circulatory complications: Secondary | ICD-10-CM | POA: Diagnosis not present

## 2017-05-18 LAB — CBC WITH DIFFERENTIAL/PLATELET
Basophils Absolute: 0 10*3/uL (ref 0.0–0.1)
Basophils Relative: 0.4 % (ref 0.0–3.0)
Eosinophils Absolute: 0 10*3/uL (ref 0.0–0.7)
Eosinophils Relative: 0.6 % (ref 0.0–5.0)
HCT: 33.3 % — ABNORMAL LOW (ref 36.0–46.0)
HEMOGLOBIN: 11 g/dL — AB (ref 12.0–15.0)
Lymphocytes Relative: 17.1 % (ref 12.0–46.0)
Lymphs Abs: 1.3 10*3/uL (ref 0.7–4.0)
MCHC: 33 g/dL (ref 30.0–36.0)
MCV: 92 fl (ref 78.0–100.0)
MONO ABS: 2.3 10*3/uL — AB (ref 0.1–1.0)
Neutro Abs: 3.9 10*3/uL (ref 1.4–7.7)
Neutrophils Relative %: 51.5 % (ref 43.0–77.0)
Platelets: 213 10*3/uL (ref 150.0–400.0)
RBC: 3.63 Mil/uL — AB (ref 3.87–5.11)
RDW: 14.4 % (ref 11.5–15.5)
WBC: 7.6 10*3/uL (ref 4.0–10.5)

## 2017-05-18 LAB — COMPREHENSIVE METABOLIC PANEL
ALBUMIN: 4.2 g/dL (ref 3.5–5.2)
ALK PHOS: 57 U/L (ref 39–117)
ALT: 8 U/L (ref 0–35)
AST: 13 U/L (ref 0–37)
BILIRUBIN TOTAL: 0.5 mg/dL (ref 0.2–1.2)
BUN: 22 mg/dL (ref 6–23)
CO2: 35 mEq/L — ABNORMAL HIGH (ref 19–32)
CREATININE: 0.98 mg/dL (ref 0.40–1.20)
Calcium: 9.4 mg/dL (ref 8.4–10.5)
Chloride: 100 mEq/L (ref 96–112)
GFR: 58.06 mL/min — ABNORMAL LOW (ref >60.00)
Glucose, Bld: 125 mg/dL — ABNORMAL HIGH (ref 70–99)
POTASSIUM: 4.6 meq/L (ref 3.5–5.1)
SODIUM: 139 meq/L (ref 135–145)
TOTAL PROTEIN: 6.2 g/dL (ref 6.0–8.3)

## 2017-05-18 LAB — HEMOGLOBIN A1C: HEMOGLOBIN A1C: 7.7 % — AB (ref 4.6–6.5)

## 2017-05-25 ENCOUNTER — Ambulatory Visit (INDEPENDENT_AMBULATORY_CARE_PROVIDER_SITE_OTHER): Payer: Medicare Other | Admitting: Family Medicine

## 2017-05-25 ENCOUNTER — Encounter: Payer: Self-pay | Admitting: Family Medicine

## 2017-05-25 VITALS — BP 130/62 | HR 70 | Temp 97.7°F | Wt 130.5 lb

## 2017-05-25 DIAGNOSIS — R7989 Other specified abnormal findings of blood chemistry: Secondary | ICD-10-CM

## 2017-05-25 DIAGNOSIS — E1159 Type 2 diabetes mellitus with other circulatory complications: Secondary | ICD-10-CM | POA: Diagnosis not present

## 2017-05-25 DIAGNOSIS — W19XXXA Unspecified fall, initial encounter: Secondary | ICD-10-CM | POA: Diagnosis not present

## 2017-05-25 DIAGNOSIS — Y92009 Unspecified place in unspecified non-institutional (private) residence as the place of occurrence of the external cause: Secondary | ICD-10-CM

## 2017-05-25 DIAGNOSIS — I25708 Atherosclerosis of coronary artery bypass graft(s), unspecified, with other forms of angina pectoris: Secondary | ICD-10-CM | POA: Diagnosis not present

## 2017-05-25 DIAGNOSIS — G4730 Sleep apnea, unspecified: Secondary | ICD-10-CM

## 2017-05-25 DIAGNOSIS — R0902 Hypoxemia: Secondary | ICD-10-CM

## 2017-05-25 LAB — CBC WITH DIFFERENTIAL/PLATELET
BASOS PCT: 0.7 % (ref 0.0–3.0)
Basophils Absolute: 0 10*3/uL (ref 0.0–0.1)
EOS ABS: 0.1 10*3/uL (ref 0.0–0.7)
Eosinophils Relative: 0.8 % (ref 0.0–5.0)
HCT: 34.8 % — ABNORMAL LOW (ref 36.0–46.0)
Hemoglobin: 11.3 g/dL — ABNORMAL LOW (ref 12.0–15.0)
Lymphocytes Relative: 17.4 % (ref 12.0–46.0)
Lymphs Abs: 1.2 10*3/uL (ref 0.7–4.0)
MCHC: 32.5 g/dL (ref 30.0–36.0)
MCV: 92.4 fl (ref 78.0–100.0)
MONO ABS: 2 10*3/uL — AB (ref 0.1–1.0)
Monocytes Relative: 27.6 % — ABNORMAL HIGH (ref 3.0–12.0)
NEUTROS ABS: 3.8 10*3/uL (ref 1.4–7.7)
Neutrophils Relative %: 53.5 % (ref 43.0–77.0)
PLATELETS: 227 10*3/uL (ref 150.0–400.0)
RBC: 3.76 Mil/uL — ABNORMAL LOW (ref 3.87–5.11)
RDW: 14.4 % (ref 11.5–15.5)
WBC: 7.2 10*3/uL (ref 4.0–10.5)

## 2017-05-25 NOTE — Patient Instructions (Addendum)
Go to the lab on the way out.  We'll contact you with your lab report. Plan on a recheck in about 3 months at or prior to a visit.  Take care.  Glad to see you.

## 2017-05-25 NOTE — Progress Notes (Signed)
She had R arm skin tear when she bumped her arm about 6 weeks ago.  Healed in the meantime.  D/w pt about fall cautions. She is still wearing her alert button.  She still lives alone.  She is using a cane.  Encouraged her to resume her home PT exercises.  D/w pt.   Diabetes:  Using medications without difficulties:yes Hypoglycemic episodes:no Hyperglycemic episodes:no Feet problems:no Blood Sugars averaging: usually <100 in the AM eye exam within last year: yes A1c stable.   Goal A1c likely near 7.5.   D/w pt.  I don't want to induce hypoglycemia.    Still with some L ankle swelling at baseline, occ in the R ankle.    She is still on O2, 2L continuous.  She is complaint.  She has f/u sleep study pending.    Monocyte elevation.  This is been noted last few CBCs. Her absolute white count is still normal. She has a relative increase in monocytes. She does not feel sick otherwise she does not have abnormal weight loss or atypical bruising. She has a history of mild stable anemia. Discussed with patient. Peripheral smear is pending.  Meds, vitals, and allergies reviewed.   ROS: Per HPI unless specifically indicated in ROS section   GEN: nad, alert and oriented HEENT: mucous membranes moist NECK: supple w/o LA CV: rrr. PULM: ctab, no inc wob ABD: soft, +bs EXT: trace L>R LE edema SKIN: no acute rash but healing R arm skin tear noted.

## 2017-05-26 LAB — PATHOLOGIST SMEAR REVIEW

## 2017-05-27 ENCOUNTER — Other Ambulatory Visit: Payer: Self-pay | Admitting: Family Medicine

## 2017-05-27 DIAGNOSIS — R7989 Other specified abnormal findings of blood chemistry: Secondary | ICD-10-CM

## 2017-05-27 DIAGNOSIS — W19XXXA Unspecified fall, initial encounter: Secondary | ICD-10-CM | POA: Insufficient documentation

## 2017-05-27 DIAGNOSIS — Y92009 Unspecified place in unspecified non-institutional (private) residence as the place of occurrence of the external cause: Secondary | ICD-10-CM

## 2017-05-27 NOTE — Assessment & Plan Note (Signed)
She has f/u sleep study pending.  Would continue O2 for now.

## 2017-05-27 NOTE — Assessment & Plan Note (Signed)
Discussed with patient. I do not think that this is an ominous process. She has a mild increase in her monocyte count. Would check peripheral smear and we will go from there. See notes on follow-up labs. D/w pt.  >25 minutes spent in face to face time with patient, >50% spent in counselling or coordination of care.

## 2017-05-27 NOTE — Assessment & Plan Note (Signed)
D/w pt about fall cautions. She is still wearing her alert button.  She still lives alone.  She is using a cane.  Encouraged her to resume her home PT exercises.  D/w pt.

## 2017-05-27 NOTE — Assessment & Plan Note (Signed)
A1c stable.   Goal A1c likely near 7.5.   D/w pt.  I don't want to induce hypoglycemia.  Labs d/w pt. No change in meds.

## 2017-05-27 NOTE — Assessment & Plan Note (Signed)
Would continue O2 for now.

## 2017-06-01 ENCOUNTER — Ambulatory Visit (HOSPITAL_BASED_OUTPATIENT_CLINIC_OR_DEPARTMENT_OTHER): Payer: Medicare Other | Attending: Pulmonary Disease | Admitting: Pulmonary Disease

## 2017-06-01 VITALS — Ht 63.0 in | Wt 128.0 lb

## 2017-06-01 DIAGNOSIS — G4733 Obstructive sleep apnea (adult) (pediatric): Secondary | ICD-10-CM | POA: Diagnosis not present

## 2017-06-01 DIAGNOSIS — J984 Other disorders of lung: Secondary | ICD-10-CM | POA: Insufficient documentation

## 2017-06-01 DIAGNOSIS — G4734 Idiopathic sleep related nonobstructive alveolar hypoventilation: Secondary | ICD-10-CM | POA: Insufficient documentation

## 2017-06-01 DIAGNOSIS — G4730 Sleep apnea, unspecified: Secondary | ICD-10-CM

## 2017-06-01 DIAGNOSIS — M419 Scoliosis, unspecified: Secondary | ICD-10-CM

## 2017-06-02 ENCOUNTER — Telehealth: Payer: Self-pay | Admitting: Pulmonary Disease

## 2017-06-02 DIAGNOSIS — J984 Other disorders of lung: Secondary | ICD-10-CM | POA: Diagnosis not present

## 2017-06-02 DIAGNOSIS — G4730 Sleep apnea, unspecified: Secondary | ICD-10-CM | POA: Diagnosis not present

## 2017-06-02 DIAGNOSIS — M419 Scoliosis, unspecified: Secondary | ICD-10-CM

## 2017-06-02 DIAGNOSIS — G4734 Idiopathic sleep related nonobstructive alveolar hypoventilation: Secondary | ICD-10-CM | POA: Diagnosis not present

## 2017-06-02 DIAGNOSIS — G4733 Obstructive sleep apnea (adult) (pediatric): Secondary | ICD-10-CM

## 2017-06-02 NOTE — Procedures (Signed)
   Patient Name: [name], [name] Date: 06/01/2017 Gender: Female D.O.B: 1934-11-12 Age (years): 39 Referring Provider: Chesley Mires MD, ABSM Height (inches): 63 Interpreting Physician: Chesley Mires MD, ABSM Weight (lbs): 128 RPSGT: Zadie Rhine BMI: 23 MRN: [id number] Neck Size: 15.50  CLINICAL INFORMATION The patient is referred for a CPAP titration to treat sleep apnea.  Date of NPSG, Split Night or HST: 04/13/17, RDI 33.9.  SLEEP STUDY TECHNIQUE As per the AASM Manual for the Scoring of Sleep and Associated Events v2.3 (April 2016) with a hypopnea requiring 4% desaturations.  The channels recorded and monitored were frontal, central and occipital EEG, electrooculogram (EOG), submentalis EMG (chin), nasal and oral airflow, thoracic and abdominal wall motion, anterior tibialis EMG, snore microphone, electrocardiogram, and pulse oximetry. Continuous positive airway pressure (CPAP) was initiated at the beginning of the study and titrated to treat sleep-disordered breathing.  MEDICATIONS Medications self-administered by patient taken the night of the study : SOTALOL, RANEXA, LIPITOR, FLOVENT  TECHNICIAN COMMENTS Comments added by technician: ONE RESTROOM VISTED. O2 initiated due to low sats. Patient had difficulty initiating sleep. Patient was restless all through the night.  Comments added by scorer: N/A  RESPIRATORY PARAMETERS Optimal PAP Pressure (cm): 12 AHI at Optimal Pressure (/hr): 0.0 Overall Minimal O2 (%): 78.00 Supine % at Optimal Pressure (%): 100 Minimal O2 at Optimal Pressure (%): 93.0     She had persistent oxygen desaturation in spite of adequate control of sleep apnea with CPAP, and this lasted for more than 5 minutes.  She was started on 2 liters supplemental oxygen with improved control of her oxygenation.  SLEEP ARCHITECTURE The study was initiated at 10:34:05 PM and ended at 4:36:38 AM.  Sleep onset time was 70.0 minutes and the sleep efficiency was  25.5%. The total sleep time was 92.5 minutes.  The patient spent 19.46% of the night in stage N1 sleep, 32.97% in stage N2 sleep, 0.00% in stage N3 and 47.57% in REM.Stage REM latency was 182.5 minutes  Wake after sleep onset was 200.1. Alpha intrusion was absent. Supine sleep was 100.00%.  CARDIAC DATA The 2 lead EKG demonstrated sinus rhythm. The mean heart rate was 55.24 beats per minute. Other EKG findings include: None.  LEG MOVEMENT DATA The total Periodic Limb Movements of Sleep (PLMS) were 0. The PLMS index was 0.00. A PLMS index of <15 is considered normal in adults.  IMPRESSIONS - The optimal PAP pressure was 12 cm of water. - She required 2 liters oxygen with CPAP.  DIAGNOSIS - Obstructive Sleep Apnea (G47.33) - Sleep Related Hypoxia (G47.34) - Restrictive Lung Disease with Kyphoscoliosis (J98.4)  RECOMMENDATIONS - Trial of CPAP therapy on 12 cm H2O with a Small size Resmed Full Face Mask AirFit F20 mask and heated humidification. - She should use 2 liters supplemental oxygen with CPAP at night.  [Electronically signed] 06/02/2017 03:11 PM  Chesley Mires MD, [place], American Board of Sleep Medicine   NPI: [id number]

## 2017-06-02 NOTE — Telephone Encounter (Signed)
CPAP titration 06/01/17 >> CPAP 12 with 2 liters oxygen   Will have my nurse inform pt that she did well with CPAP during titration study.  She did also need supplemental oxygen with CPAP.  Please send order to have her get set up with CPAP 12 cm H2O with heated humidity and mask of choice.  She will need to use 2 liters oxygen at night with CPAP.  She will need f/u with me or NP 2 months after CPAP set up.

## 2017-06-03 ENCOUNTER — Other Ambulatory Visit: Payer: Self-pay | Admitting: Family Medicine

## 2017-06-04 NOTE — Telephone Encounter (Signed)
Patient is returning phone call.  °

## 2017-06-04 NOTE — Telephone Encounter (Signed)
Spoke with patient. Explained to her the results of her test. She verbalized understanding. She stated that she is already getting her O2 from [facility] and wishes to get the CPAP machine from them as well. Will go ahead and place order. She is aware. Nothing else needed at time of call.

## 2017-06-04 NOTE — Telephone Encounter (Signed)
LM x 1 

## 2017-06-14 ENCOUNTER — Ambulatory Visit: Payer: Medicare Other

## 2017-06-14 ENCOUNTER — Ambulatory Visit: Payer: Medicare Other | Admitting: Family Medicine

## 2017-06-25 ENCOUNTER — Other Ambulatory Visit: Payer: Self-pay | Admitting: Cardiovascular Disease

## 2017-06-25 ENCOUNTER — Other Ambulatory Visit: Payer: Self-pay | Admitting: Family Medicine

## 2017-06-28 ENCOUNTER — Ambulatory Visit (INDEPENDENT_AMBULATORY_CARE_PROVIDER_SITE_OTHER): Payer: Medicare Other | Admitting: Cardiovascular Disease

## 2017-06-28 VITALS — BP 108/62 | HR 61 | Ht 63.0 in | Wt 132.6 lb

## 2017-06-28 DIAGNOSIS — E785 Hyperlipidemia, unspecified: Secondary | ICD-10-CM | POA: Diagnosis not present

## 2017-06-28 DIAGNOSIS — I480 Paroxysmal atrial fibrillation: Secondary | ICD-10-CM | POA: Diagnosis not present

## 2017-06-28 DIAGNOSIS — E1159 Type 2 diabetes mellitus with other circulatory complications: Secondary | ICD-10-CM

## 2017-06-28 DIAGNOSIS — I739 Peripheral vascular disease, unspecified: Secondary | ICD-10-CM | POA: Diagnosis not present

## 2017-06-28 DIAGNOSIS — I10 Essential (primary) hypertension: Secondary | ICD-10-CM

## 2017-06-28 DIAGNOSIS — I5042 Chronic combined systolic (congestive) and diastolic (congestive) heart failure: Secondary | ICD-10-CM

## 2017-06-28 DIAGNOSIS — Z5181 Encounter for therapeutic drug level monitoring: Secondary | ICD-10-CM | POA: Diagnosis not present

## 2017-06-28 DIAGNOSIS — Z79899 Other long term (current) drug therapy: Secondary | ICD-10-CM | POA: Diagnosis not present

## 2017-06-28 DIAGNOSIS — I209 Angina pectoris, unspecified: Secondary | ICD-10-CM

## 2017-06-28 DIAGNOSIS — I25708 Atherosclerosis of coronary artery bypass graft(s), unspecified, with other forms of angina pectoris: Secondary | ICD-10-CM | POA: Diagnosis not present

## 2017-06-28 NOTE — Patient Instructions (Addendum)
Your physician recommends that you return for lab work at your convenience - FASTING.  Dr Sallyanne Kuster recommends that you schedule a follow-up appointment in 6 months. You will receive a reminder letter in the mail two months in advance. If you don't receive a letter, please call our office to schedule the follow-up appointment.  If you need a refill on your cardiac medications before your next appointment, please call your pharmacy.

## 2017-06-28 NOTE — Progress Notes (Signed)
Patient ID: [name], female   DOB: [birth year], 82 y.o.   MRN: [id number]    Cardiology Office Note    Date:  06/29/2017   ID:  [name], DOB 26-Apr-1935, MRN [id number]  PCP:  Tonia Ghent, MD  Cardiologist:   Sanda Klein, MD   chief complaint: Follow-up coronary disease and atrial fibrillation, chronic sotalol therapy   History of Present Illness:  [name] is a 82 y.o. female who presents for Follow-up for coronary artery disease status post previous bypass surgery with exertional angina , paroxysmal atrial fibrillation on sotalol therapy , not on anticoagulation due to history of spontaneous retroperitoneal hematoma, treated hypertension, hyperlipidemia and diabetes mellitus complicated by PAD and chronic kidney disease stage II.  He has not had a fall since last October, but still feels rather lethargic. She doesn't feel like she is fully recovered from that fall, which was complicated by pneumonia. She has been on oxygen ever since. She has functional class II dyspnea, although this doesn't seem to be impacted by whether or not she wears the oxygen. She has chronic ankle swelling that waxes and wanes. It's always a little worse in her right ankle. Mostly taken care of by compression stockings. She is wearing CPAP for obstructive sleep apnea now. Denies any problems with chest pain either at rest or with exertion. She has not had any bleeding problems or any focal neurological events.  While on treatment with low-dose sotalol and Ranexa she has not had recurrence of atrial fibrillation, at least not symptomatic event. QTC is 430 ms today.  [name] is a 82 year old woman with a history of coronary disease, now 12 years status post post bypass surgery. (interval total occlusion 3 saphenous vein grafts, patent LIMA to LAD, total occlusion of the major OM artery and the right coronary artery with some collateral filling). Ranexa has had a substantial impact with virtual  abolition of exertional angina pectoris.  She has a history of paroxysmal atrial fibrillation. While on treatment with aspirin and Plavix she developed a spontaneous retroperitoneal hematoma. She is therefore not taking anticoagulants. Ranexa may also be helping with a reduction in the burden of atrial fibrillation, without signs of QT interval prolongation. She has a moderate to severely dilated left atrium. Most recent EF assessment was >60% by scintigraphy and LV angiography in 2012, NYHA class II symptoms of congestive heart failure. She has a mild to moderate degree of mitral insufficiency.   Also has type 2 diabetes mellitus, mixed hyperlipidemia and systemic hypertension. She has had several serious falls and injuries due to poor balance, not syncope. She still lives alone  Past Medical History:  Diagnosis Date  . Anemia, iron deficiency   . Atrial fibrillation ([place]) 11/28/2010   2D Echo - EF-30-35, left atrium moderate to severely dilated, right ventricle moderately dilated, tricuspid valve mild-moderate regurgitation  . 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 Hassell Done  . CAP (community acquired pneumonia)   . Chronic kidney disease (CKD), stage II (mild)   . Coronary artery disease   . Diabetes mellitus, type 2 ([place])   . Diverticulitis   . History of breast cancer    Followed yearly by [facility]CS  . Hyperlipemia   . Hypertension   . Nontraumatic retroperitoneal hematoma   . Osteoporosis   . Peripheral vascular disease [facility])     Past Surgical History:  Procedure Laterality Date  . APPENDECTOMY  1962  . BREAST SURGERY  02/01   Quadrectomy Breast cancer (Dr. Hassell Done)  . CARDIAC CATHETERIZATION  2000   Iliac Stent (Dr. Juanita Craver) yearly follow-up  . CARDIAC CATHETERIZATION  01/25/02   High grade LAD disease, total of 3 vess, involvement  . CARDIAC CATHETERIZATION  01/13/07   SV graft to diag. occluded but good backfill o/w ok  . CARDIAC CATHETERIZATION  02/14/10   PTCA & stent  vein graft to OM/RCA ([place])  . CARDIAC CATHETERIZATION  04/17/2011   Saphenous vein grafts to the RCA 100% occluded, to the OM 100% occluded, and the diagonal 100% occluded. Internal mammary artery to the LAD, widely patent.  Marland Kitchen CATARACT EXTRACTION  10/10 and 02/11  . COLOSTOMY    . CORONARY ARTERY BYPASS GRAFT  02/09/02   X 4 ([place])  . DILATION AND CURETTAGE OF UTERUS  1971    miscarrage, abnormal bleeding  . DILATION AND CURETTAGE OF UTERUS  1995   Menorrhagia  . HEMORRHOID SURGERY  09/11/04   [place] Hassell Done)  . PARTIAL COLECTOMY    . SHOULDER ARTHROSCOPY  05/04   Right, with rotator cuff debridement    Outpatient Medications Prior to Visit  Medication Sig Dispense Refill  . amLODipine (NORVASC) 10 MG tablet TAKE 1 TABLET (10 MG TOTAL) BY MOUTH DAILY. 30 tablet 1  . aspirin EC 81 MG tablet Take 81 mg by mouth daily.    Marland Kitchen atorvastatin (LIPITOR) 20 MG tablet TAKE 1 TABLET BY MOUTH AT BEDTIME 90 tablet 3  . BD INSULIN SYRINGE ULTRAFINE 31G X 5/16" 0.3 ML MISC USE DAILY AND AS DIRECTED SLIDING SCALE. MULTIPLE INJECTIONS OF INSULIN PER DAY 200 each 3  . benazepril (LOTENSIN) 20 MG tablet Take 1 tablet (20 mg total) by mouth daily. 30 tablet 11  . Cholecalciferol (VITAMIN D) 1000 UNITS capsule Take 1,000 Units by mouth 2 (two) times daily.      . fluticasone (FLOVENT HFA) 44 MCG/ACT inhaler Inhale 2 puffs into the lungs 2 (two) times daily. 1 Inhaler 5  . glyBURIDE (DIABETA) 5 MG tablet TAKE 2 TABLETS (10 MG TOTAL) BY MOUTH DAILY WITH BREAKFAST. 180 tablet 1  . meclizine (ANTIVERT) 25 MG tablet Take 1 tablet (25 mg total) by mouth daily as needed. For dizziness 30 tablet 6  . nitroGLYCERIN (NITROSTAT) 0.4 MG SL tablet Place 1 tablet (0.4 mg total) under the tongue as needed for chest pain. As directed 25 tablet 11  . NON FORMULARY Oxygen 2 liters 24/7    . NOVOLOG 100 UNIT/ML injection USE PER SLIDING SCALE--200-250=4-5 UNITS, 251-300=5-6 UNITS,301-350=6-7 UNITS, WITH MEALS 10 mL 5    . nystatin (MYCOSTATIN/NYSTOP) 100000 UNIT/GM POWD Apply up to 3 times a day if needed. 15 g 5  . ONE TOUCH ULTRA TEST test strip USE TO CHECK BLOOD SUGAR 4 TIMES A DAY. INSULIN-DEPENDENT. DIAGNOSIS: E11.9 100 each 5  . Polyvinyl Alcohol-Povidone (REFRESH OP) Place 1 drop into both eyes daily as needed.    Marland Kitchen RANEXA 500 MG 12 hr tablet TAKE 1 TABLET (500 MG TOTAL) BY MOUTH 2 (TWO) TIMES DAILY. NEED APPOINTMENT FOR MORE REFILLS 60 tablet 10  . sotalol (BETAPACE) 80 MG tablet Take 0.5 tablets (40 mg total) by mouth 2 (two) times daily. 90 tablet 3  . amLODipine (NORVASC) 10 MG tablet Take 10 mg by mouth daily.     No facility-administered medications prior to visit.      Allergies:   Actos [pioglitazone]; Metformin and related; and Zocor [simvastatin]   Social History   Social  History  . Marital status: Widowed    Spouse name: N/A  . Number of children: 2  . Years of education: N/A   Occupational History  . Retired    Social History Main Topics  . Smoking status: Never Smoker  . Smokeless tobacco: Never Used  . Alcohol use No  . Drug use: No  . Sexual activity: No   Other Topics Concern  . Not on file   Social History Narrative   Retired 1991 from [employer] work   Widowed as of 04/2011 after 50+ years   Lives alone   2 kids in [place]   Enjoys travelling and reading     Family History:  The patient's family history includes COPD in her brother; Diabetes in her mother and son; Gout in her brother; Heart disease in her brother, brother, and mother; Heart disease (age of onset: 78) in her father; Hyperlipidemia in her brother; Hypertension in her brother, brother, brother, father, mother, and son; Obesity in her brother.   ROS:   Please see the history of present illness.    ROS All other systems reviewed and are negative.   PHYSICAL EXAM:   VS:  BP 108/62   Pulse 61   Ht 5\' 3"  (1.6 m)   Wt 132 lb 9.6 oz (60.1 kg)   BMI 23.49 kg/m     General: Alert,  oriented x3, no distress, well-nourished Head: no evidence of trauma, PERRL, EOMI, no exophtalmos or lid lag, no myxedema, no xanthelasma; normal ears, nose and oropharynx Neck: normal jugular venous pulsations and no hepatojugular reflux; brisk carotid pulses without delay and no carotid bruits Chest: clear to auscultation, no signs of consolidation by percussion or palpation, normal fremitus, symmetrical and full respiratory excursions Cardiovascular: normal position and quality of the apical impulse, regular rhythm, normal first and second heart sounds, grade 1/6 apical holosystolic murmur radiating toward the axilla, no diastolic murmurs, rubs or gallops.  Abdomen: no tenderness or distention, no masses by palpation, no abnormal pulsatility or arterial bruits, normal bowel sounds, no hepatosplenomegaly Extremities: no clubbing, cyanosis; 1+ right ankle edema, trace left ankle edema; 2+ radial, ulnar and brachial pulses bilaterally; 2+ right femoral, posterior tibial and dorsalis pedis pulses; 2+ left femoral, posterior tibial and dorsalis pedis pulses; no subclavian or femoral bruits Neurological: grossly nonfocal Psych: Normal mood and affect   Wt Readings from Last 3 Encounters:  06/28/17 132 lb 9.6 oz (60.1 kg)  06/01/17 128 lb (58.1 kg)  05/25/17 130 lb 8 oz (59.2 kg)      Studies/Labs Reviewed:   EKG:  EKG is ordered today.  The ekg ordered today demonstrates Sinus rhythm with a single PAC, poor R progression V1-V4, QTC 430 ms Recent Labs: 11/23/2016: Magnesium 1.8 05/18/2017: ALT 8; BUN 22; Creatinine, Ser 0.98; Potassium 4.6; Sodium 139 05/25/2017: Hemoglobin 11.3; Platelets 227.0   Lipid Panel    Component Value Date/Time   CHOL 134 12/09/2015 0853   TRIG 102.0 12/09/2015 0853   HDL 42.60 12/09/2015 0853   CHOLHDL 3 12/09/2015 0853   VLDL 20.4 12/09/2015 0853   LDLCALC 71 12/09/2015 0853   LDLDIRECT 87.3 11/21/2008 1446    ASSESSMENT:    1. Chronic combined systolic  and diastolic congestive heart failure ([place])   2. Coronary artery disease of bypass graft of native heart with stable angina pectoris (HCC)   3. Paroxysmal atrial fibrillation ([place])   4. Dyslipidemia   5. Type 2 diabetes mellitus with vascular disease ([place])  6. Peripheral vascular disease ([place])   7. Essential hypertension   8. Medication management      PLAN:  In order of problems listed above:  1. CHF: NYHA functional class II exertional dyspnea, never fully recovered her full stamina since her episode of rib fractures and pneumonia.. Has minimal ankle swelling, otherwise no signs of hypervolemia. 2. CAD: CCS class 1, no angina since starting Ranexa. Also on high-dose amlodipine.. She is essentially dependent on the LIMA to LAD bypass graft, with occlusion of all 3 vein grafts as well as the native oblique marginal and native right coronary artery. There were no good options for either percutaneous or surgical revascularization when she last had a heart catheterization in 2012. 3. AFib: Excellent symptom control on low-dose sotalol and Ranexa, QT normal range. Unable to take anticoagulation due to history of severe retroperitoneal bleeding when on aspirin/clopidogrel combination. She has also had at least one serious fall recently. CHADSVasc 7 (age 89, gender, diabetes, hypertension, heart failure, vascular disease). Thankfully no history of stroke or TIA 4. HLP: All lipid parameters in satisfactory range. We stopped the Zetia since it was becoming financially difficult to pay for it, she has also lost a lot of weight. Time to recheck her lipid profile 5. DM: She has improved glycemic control. Still on insulin. 6. PAD: Currently asymptomatic 7. HTN: Well-controlled. On benazepril and amlodipine. If her blood pressure becomes low I would further decrease the dose of benazepril while continuing the antianginal. Cannot take beta blockers. We already had to decrease her dose of sotalol due to  bradycardia.  Medication Adjustments/Labs and Tests Ordered: Current medicines are reviewed at length with the patient today.  Concerns regarding medicines are outlined above.  Medication changes, Labs and Tests ordered today are listed in the Patient Instructions below. Patient Instructions  Your physician recommends that you return for lab work at your [place].  Dr Sallyanne Kuster recommends that you schedule a follow-up appointment in 6 months. You will receive a reminder letter in the mail two months in advance. If you don't receive a letter, please call our office to schedule the follow-up appointment.  If you need a refill on your cardiac medications before your next appointment, please call your pharmacy.      Signed, Sanda Klein, MD  06/29/2017 1:14 PM    [facility] [place], [place], [place]  [address] Phone: [phone]; Fax: [phone]

## 2017-06-29 ENCOUNTER — Encounter: Payer: Self-pay | Admitting: Cardiovascular Disease

## 2017-07-07 ENCOUNTER — Other Ambulatory Visit: Payer: Self-pay | Admitting: Cardiovascular Disease

## 2017-07-07 DIAGNOSIS — Z23 Encounter for immunization: Secondary | ICD-10-CM | POA: Diagnosis not present

## 2017-07-12 DIAGNOSIS — Z79899 Other long term (current) drug therapy: Secondary | ICD-10-CM | POA: Diagnosis not present

## 2017-07-12 DIAGNOSIS — E785 Hyperlipidemia, unspecified: Secondary | ICD-10-CM | POA: Diagnosis not present

## 2017-07-13 LAB — LIPID PANEL
Chol/HDL Ratio: 3.3 ratio (ref 0.0–4.4)
Cholesterol, Total: 154 mg/dL (ref 100–199)
HDL: 47 mg/dL (ref >39)
LDL Calculated: 91 mg/dL (ref 0–99)
Triglycerides: 81 mg/dL (ref 0–149)
VLDL Cholesterol Cal: 16 mg/dL (ref 5–40)

## 2017-07-15 ENCOUNTER — Encounter: Payer: Self-pay | Admitting: Family Medicine

## 2017-08-09 ENCOUNTER — Other Ambulatory Visit: Payer: Self-pay | Admitting: Family Medicine

## 2017-08-09 DIAGNOSIS — K862 Cyst of pancreas: Secondary | ICD-10-CM

## 2017-08-09 NOTE — Progress Notes (Unsigned)
Due for follow-up abdominal CT re: pancreatic cyst.  Ordered. Thanks.    Left detailed message on voicemail. 08/09/17 L. [place], CMA

## 2017-08-10 ENCOUNTER — Other Ambulatory Visit: Payer: Self-pay | Admitting: Family Medicine

## 2017-08-10 DIAGNOSIS — K862 Cyst of pancreas: Secondary | ICD-10-CM

## 2017-08-17 ENCOUNTER — Other Ambulatory Visit (INDEPENDENT_AMBULATORY_CARE_PROVIDER_SITE_OTHER): Payer: Medicare Other

## 2017-08-17 DIAGNOSIS — R7989 Other specified abnormal findings of blood chemistry: Secondary | ICD-10-CM | POA: Diagnosis not present

## 2017-08-17 DIAGNOSIS — K862 Cyst of pancreas: Secondary | ICD-10-CM | POA: Diagnosis not present

## 2017-08-17 LAB — CREATININE, SERUM: Creatinine, Ser: 1.1 mg/dL (ref 0.40–1.20)

## 2017-08-17 LAB — BUN: BUN: 21 mg/dL (ref 6–23)

## 2017-08-17 NOTE — Addendum Note (Signed)
Addended by: Ellamae Sia on: 08/17/2017 11:46 AM   Modules accepted: Orders

## 2017-08-20 ENCOUNTER — Other Ambulatory Visit: Payer: Self-pay | Admitting: Cardiovascular Disease

## 2017-08-20 ENCOUNTER — Ambulatory Visit (INDEPENDENT_AMBULATORY_CARE_PROVIDER_SITE_OTHER)
Admission: RE | Admit: 2017-08-20 | Discharge: 2017-08-20 | Disposition: A | Discharge status: Home / Self Care | Payer: Medicare Other | Source: Ambulatory Visit | Attending: Family Medicine | Admitting: Family Medicine

## 2017-08-20 DIAGNOSIS — K862 Cyst of pancreas: Secondary | ICD-10-CM | POA: Diagnosis not present

## 2017-08-20 MED ORDER — IOPAMIDOL (ISOVUE-300) INJECTION 61%
100.0000 mL | Freq: Once | INTRAVENOUS | Status: AC | PRN
Start: 2017-08-20 — End: 2017-08-20
  Administered 2017-08-20: 80 mL via INTRAVENOUS

## 2017-08-23 ENCOUNTER — Other Ambulatory Visit: Payer: Self-pay | Admitting: Family Medicine

## 2017-08-23 ENCOUNTER — Ambulatory Visit: Payer: Medicare Other | Admitting: Family Medicine

## 2017-08-23 ENCOUNTER — Telehealth: Payer: Self-pay | Admitting: Family Medicine

## 2017-08-23 ENCOUNTER — Telehealth: Payer: Self-pay

## 2017-08-23 ENCOUNTER — Ambulatory Visit: Payer: Medicare Other

## 2017-08-23 DIAGNOSIS — N2889 Other specified disorders of kidney and ureter: Secondary | ICD-10-CM

## 2017-08-23 NOTE — Telephone Encounter (Signed)
I called patient to reschedule her appointment with Dr.Duncan today.  Patient asked to get the results of her CT report.  Patient will be going out of town tomorrow.  Patient said if she's not reached on her home phone, please call her back on her cell phone.

## 2017-08-23 NOTE — Telephone Encounter (Signed)
Michaele Offer RN sent skype; rec'd call report with results of CT Abdomen on Dr. Josefine Class pt., [name]/ dob 04/25/2035.  please have him review the CT Abdomen from 11/16, in EPIC . Copy of CT abd taken to Dr Josefine Class area.

## 2017-08-29 ENCOUNTER — Other Ambulatory Visit: Payer: Self-pay | Admitting: Family Medicine

## 2017-08-29 DIAGNOSIS — E1159 Type 2 diabetes mellitus with other circulatory complications: Secondary | ICD-10-CM

## 2017-08-30 ENCOUNTER — Other Ambulatory Visit: Payer: Self-pay | Admitting: *Deleted

## 2017-08-30 ENCOUNTER — Ambulatory Visit (INDEPENDENT_AMBULATORY_CARE_PROVIDER_SITE_OTHER): Payer: Medicare Other | Admitting: Adult Health

## 2017-08-30 ENCOUNTER — Encounter: Payer: Self-pay | Admitting: Adult Health

## 2017-08-30 DIAGNOSIS — G4730 Sleep apnea, unspecified: Secondary | ICD-10-CM

## 2017-08-30 DIAGNOSIS — J4530 Mild persistent asthma, uncomplicated: Secondary | ICD-10-CM

## 2017-08-30 DIAGNOSIS — I25708 Atherosclerosis of coronary artery bypass graft(s), unspecified, with other forms of angina pectoris: Secondary | ICD-10-CM

## 2017-08-30 DIAGNOSIS — J9611 Chronic respiratory failure with hypoxia: Secondary | ICD-10-CM

## 2017-08-30 DIAGNOSIS — J9612 Chronic respiratory failure with hypercapnia: Secondary | ICD-10-CM

## 2017-08-30 MED ORDER — RANOLAZINE ER 500 MG PO TB12: ORAL_TABLET | ORAL | 1 refills | Status: DC

## 2017-08-30 MED ORDER — BENAZEPRIL HCL 20 MG PO TABS: 20.0000 mg | ORAL_TABLET | Freq: Every day | ORAL | 1 refills | Status: DC

## 2017-08-30 MED ORDER — FLUTICASONE PROPIONATE HFA 44 MCG/ACT IN AERO: 2.0000 | INHALATION_SPRAY | Freq: Two times a day (BID) | RESPIRATORY_TRACT | 5 refills | Status: DC

## 2017-08-30 NOTE — Assessment & Plan Note (Signed)
Cont on O2 with act and At bedtime  W/ CPAP   Plan  Patient Instructions  Continue on Flovent 2 puffs Twice daily, rinse after use.  Continue on CPAP At bedtime  With oxygen .  Continue on Oxygen 2lm/  With activity.  Follow up with Dr. Halford Chessman  In 4 months and As needed   Please contact office for sooner follow up if symptoms do not improve or worsen or seek emergency care

## 2017-08-30 NOTE — Patient Instructions (Addendum)
Continue on Flovent 2 puffs Twice daily, rinse after use.  Continue on CPAP At bedtime  With oxygen .  Continue on Oxygen 2lm/  With activity.  Follow up with Dr. Halford Chessman  In 4 months and As needed   Please contact office for sooner follow up if symptoms do not improve or worsen or seek emergency care

## 2017-08-30 NOTE — Progress Notes (Signed)
I have reviewed and agree with assessment/plan.  Chesley Mires, MD [facility] Pulmonary/Critical Care 08/30/2017, 3:13 PM Pager:  [phone]

## 2017-08-30 NOTE — Assessment & Plan Note (Signed)
Well controlled on CPAP   Plan  Patient Instructions  Continue on Flovent 2 puffs Twice daily, rinse after use.  Continue on CPAP At bedtime  With oxygen .  Continue on Oxygen 2lm/  With activity.  Follow up with Dr. Halford Chessman  In 4 months and As needed   Please contact office for sooner follow up if symptoms do not improve or worsen or seek emergency care

## 2017-08-30 NOTE — Assessment & Plan Note (Signed)
Controlled on Flovent  No cough on ACE inhibitor .   Plan  Patient Instructions  Continue on Flovent 2 puffs Twice daily, rinse after use.  Continue on CPAP At bedtime  With oxygen .  Continue on Oxygen 2lm/  With activity.  Follow up with Dr. Halford Chessman  In 4 months and As needed   Please contact office for sooner follow up if symptoms do not improve or worsen or seek emergency care

## 2017-08-30 NOTE — Progress Notes (Signed)
@Patient  ID: [name], female    DOB: 04-May-1935, 82 y.o.   MRN: [id number]  Chief Complaint  Patient presents with  . Follow-up    asthma     Referring provider: Tonia Ghent, MD  HPI: 82 year old female never smoker seen for pulmonary consult 01/27/2017 for hypoxia and shortness of breath. Patient had a fall in October 2017 with multiple rib fractures. She was admitted with pneumonia in February 2018 and had hypoxic and hypercapnic respiratory failure. And was placed on O2 at 2 L.  TEST /Events  CT chest 02/05/2017 showed mild pleural thickening bilaterally, 4 mm right lower lobe lung nodule, healed rib fractures, kyphosis. And a 2.6 cm low-density lesion in the pancreatic tail. Overnight oximetry test done on 02/04/2017 on O2 at 2l/m . showed showed average SPO2 95%, low SPO2 83%, spent 5 minutes with SPO2 less than 88%. PFT that showed an FEV1 at 45%, ratio 77, FVC 43%, positive bronchodilator response. DLCO 45%. Total lung capacity 63%. NSPG 04/2017 >RDI 33/hr  CPAP titration 06/01/17 >> CPAP 12 with 2 liters oxygen   08/30/2017 Follow up : Asthma /O2 RF /OSA  Pt returns for 4 month follow up . Says breathing is doing well on Flovent. No flare of cough or wheezing .   Had sleep study done in 04/2017 showed severe OSA . She had CPAP titration 05/2017 that showed optimal control on CPAP 12cmH2O w/ 2 l/m O2.  Feels better rested. Mask not fitting as well . We looked at dream wear full face mask . And she likes it better. Download shows excellent compliance with avg usage at 7hr . AHI 4.3/hr. + leaks.   She is on O2 at 2l/m walking and At bedtime  W/ CPAP .  O2 sats at rest >90%. Walking on room air O2 sats 88%. On O2 2l/m >90% .   On lotensin . No cough     Allergies  Allergen Reactions  . Actos [Pioglitazone] Other (See Comments)    edema  . Metformin And Related Other (See Comments)    Held due to renal function  . Zocor [Simvastatin] Other (See Comments)   Myalgias    Immunization History  Administered Date(s) Administered  . Influenza Split 07/09/2011  . Influenza Whole 08/05/2006, 07/28/2007, 07/02/2008  . Influenza,inj,Quad PF,6+ Mos 07/19/2013, 06/25/2014, 08/13/2015, 06/16/2016  . Influenza-Unspecified 07/07/2017  . Pneumococcal Conjugate-13 01/03/2015  . Pneumococcal Polysaccharide-23 11/02/2005  . Td 01/10/2001  . Tdap 09/18/2011, 06/16/2016  . Zoster 07/08/2006    Past Medical History:  Diagnosis Date  . Anemia, iron deficiency   . Atrial fibrillation ([place]) 11/28/2010   2D Echo - EF-30-35, left atrium moderate to severely dilated, right ventricle moderately dilated, tricuspid valve mild-moderate regurgitation  . Cancer [facility])    R beast cancer followed by Dr. Hassell Done  . CAP (community acquired pneumonia)   . Chronic kidney disease (CKD), stage II (mild)   . Coronary artery disease   . Diabetes mellitus, type 2 ([place])   . Diverticulitis   . History of breast cancer    Followed yearly by [facility]  . Hyperlipemia   . Hypertension   . Nontraumatic retroperitoneal hematoma   . Osteoporosis   . Peripheral vascular disease ([place])     Tobacco History: Social History   Tobacco Use  Smoking Status Never Smoker  Smokeless Tobacco Never Used   Counseling given: Not Answered   Outpatient Encounter Medications as of 08/30/2017  Medication Sig  . amLODipine (NORVASC)  10 MG tablet Take one (1) tablet (10 mg) by mouth daily.  Marland Kitchen aspirin EC 81 MG tablet Take 81 mg by mouth daily.  Marland Kitchen atorvastatin (LIPITOR) 20 MG tablet TAKE 1 TABLET BY MOUTH AT BEDTIME  . BD INSULIN SYRINGE ULTRAFINE 31G X 5/16" 0.3 ML MISC USE DAILY AND AS DIRECTED SLIDING SCALE. MULTIPLE INJECTIONS OF INSULIN PER DAY  . benazepril (LOTENSIN) 20 MG tablet Take 1 tablet (20 mg total) by mouth daily.  . Cholecalciferol (VITAMIN D) 1000 UNITS capsule Take 1,000 Units by mouth 2 (two) times daily.    . fluticasone (FLOVENT HFA) 44 MCG/ACT inhaler Inhale 2 puffs into the  lungs 2 (two) times daily.  Marland Kitchen glyBURIDE (DIABETA) 5 MG tablet TAKE 2 TABLETS (10 MG TOTAL) BY MOUTH DAILY WITH BREAKFAST.  Marland Kitchen meclizine (ANTIVERT) 25 MG tablet Take 1 tablet (25 mg total) by mouth daily as needed. For dizziness  . nitroGLYCERIN (NITROSTAT) 0.4 MG SL tablet Place 1 tablet (0.4 mg total) under the tongue as needed for chest pain. As directed  . NON FORMULARY Oxygen 2 liters 24/7  . NOVOLOG 100 UNIT/ML injection USE PER SLIDING SCALE--200-250=4-5 UNITS, 251-300=5-6 UNITS,301-350=6-7 UNITS, WITH MEALS  . nystatin (MYCOSTATIN/NYSTOP) 100000 UNIT/GM POWD Apply up to 3 times a day if needed.  . ONE TOUCH ULTRA TEST test strip USE TO CHECK BLOOD SUGAR 4 TIMES A DAY. INSULIN-DEPENDENT. DIAGNOSIS: E11.9  . Polyvinyl Alcohol-Povidone (REFRESH OP) Place 1 drop into both eyes daily as needed.  Marland Kitchen RANEXA 500 MG 12 hr tablet TAKE 1 TABLET (500 MG TOTAL) BY MOUTH 2 (TWO) TIMES DAILY. NEED APPOINTMENT FOR MORE REFILLS  . sotalol (BETAPACE) 80 MG tablet Take 0.5 tablets (40 mg total) by mouth 2 (two) times daily.  . [DISCONTINUED] fluticasone (FLOVENT HFA) 44 MCG/ACT inhaler Inhale 2 puffs into the lungs 2 (two) times daily.   No facility-administered encounter medications on file as of 08/30/2017.      Review of Systems  Constitutional:   No  weight loss, night sweats,  Fevers, chills, + fatigue, or  lassitude.  HEENT:   No headaches,  Difficulty swallowing,  Tooth/dental problems, or  Sore throat,                No sneezing, itching, ear ache, nasal congestion, post nasal drip,   CV:  No chest pain,  Orthopnea, PND, swelling in lower extremities, anasarca, dizziness, palpitations, syncope.   GI  No heartburn, indigestion, abdominal pain, nausea, vomiting, diarrhea, change in bowel habits, loss of appetite, bloody stools.   Resp:    No chest wall deformity  Skin: no rash or lesions.  GU: no dysuria, change in color of urine, no urgency or frequency.  No flank pain, no hematuria   MS:   No joint pain or swelling.  No decreased range of motion.  No back pain.    Physical Exam  BP 124/66 (BP Location: Left Arm, Cuff Size: Normal)   Pulse 75   Ht 5\' 3"  (1.6 m)   Wt 132 lb (59.9 kg)   SpO2 93%   BMI 23.38 kg/m   GEN: A/Ox3; pleasant , NAD, elderly    HEENT:  [place]/AT,  EACs-clear, TMs-wnl, NOSE-clear, THROAT-clear, no lesions, no postnasal drip or exudate noted. Class 2 MP airway   NECK:  Supple w/ fair ROM; no JVD; normal carotid impulses w/o bruits; no thyromegaly or nodules palpated; no lymphadenopathy.    RESP  Clear  P & A; w/o, wheezes/ rales/ or rhonchi.  no accessory muscle use, no dullness to percussion  CARD:  RRR, no m/r/g, no peripheral edema, pulses intact, no cyanosis or clubbing.  GI:   Soft & nt; nml bowel sounds; no organomegaly or masses detected.   Musco: Warm bil, no deformities or joint swelling noted.   Neuro: alert, no focal deficits noted.    Skin: Warm, no lesions or rashes    Lab Results:  CBC  BNP No results found for: BNP  Imaging:    Assessment & Plan:   Chronic respiratory failure with hypoxia and hypercapnia (HCC) Cont on O2 with act and At bedtime  W/ CPAP   Plan  Patient Instructions  Continue on Flovent 2 puffs Twice daily, rinse after use.  Continue on CPAP At bedtime  With oxygen .  Continue on Oxygen 2lm/  With activity.  Follow up with Dr. Halford Chessman  In 4 months and As needed   Please contact office for sooner follow up if symptoms do not improve or worsen or seek emergency care      Asthma Controlled on Flovent  No cough on ACE inhibitor .   Plan  Patient Instructions  Continue on Flovent 2 puffs Twice daily, rinse after use.  Continue on CPAP At bedtime  With oxygen .  Continue on Oxygen 2lm/  With activity.  Follow up with Dr. Halford Chessman  In 4 months and As needed   Please contact office for sooner follow up if symptoms do not improve or worsen or seek emergency care      Sleep apnea,  unspecified Well controlled on CPAP   Plan  Patient Instructions  Continue on Flovent 2 puffs Twice daily, rinse after use.  Continue on CPAP At bedtime  With oxygen .  Continue on Oxygen 2lm/  With activity.  Follow up with Dr. Halford Chessman  In 4 months and As needed   Please contact office for sooner follow up if symptoms do not improve or worsen or seek emergency care         Rexene Edison, NP 08/30/2017

## 2017-09-02 ENCOUNTER — Ambulatory Visit (INDEPENDENT_AMBULATORY_CARE_PROVIDER_SITE_OTHER): Payer: Medicare Other

## 2017-09-02 VITALS — BP 122/78 | HR 58 | Temp 98.6°F | Ht 61.0 in | Wt 133.5 lb

## 2017-09-02 DIAGNOSIS — E1159 Type 2 diabetes mellitus with other circulatory complications: Secondary | ICD-10-CM

## 2017-09-02 DIAGNOSIS — Z0000 Encounter for general adult medical examination without abnormal findings: Secondary | ICD-10-CM | POA: Diagnosis not present

## 2017-09-02 LAB — HEMOGLOBIN A1C: Hgb A1c MFr Bld: 8.1 % — ABNORMAL HIGH (ref 4.6–6.5)

## 2017-09-02 NOTE — Progress Notes (Signed)
Subjective:   [name] is a 82 y.o. female who presents for Medicare Annual (Subsequent) preventive examination.  Review of Systems:  N/A Cardiac Risk Factors include: advanced age (>40men, >65 women);diabetes mellitus;hypertension;dyslipidemia     Objective:     Vitals: BP 122/78 (BP Location: Right Arm, Patient Position: Sitting, Cuff Size: Normal)   Pulse (!) 58   Temp 98.6 F (37 C) (Oral)   Ht 5\' 1"  (1.549 m) Comment: no shoes  Wt 133 lb 8 oz (60.6 kg)   SpO2 95%   BMI 25.22 kg/m   Body mass index is 25.22 kg/m.   Tobacco Social History   Tobacco Use  Smoking Status Never Smoker  Smokeless Tobacco Never Used     Counseling given: No   Past Medical History:  Diagnosis Date  . Anemia, iron deficiency   . Atrial fibrillation ([place]) 11/28/2010   2D Echo - EF-30-35, left atrium moderate to severely dilated, right ventricle moderately dilated, tricuspid valve mild-moderate regurgitation  . Cancer [facility])    R beast cancer followed by Dr. Hassell Done  . CAP (community acquired pneumonia)   . Chronic kidney disease (CKD), stage II (mild)   . Coronary artery disease   . Diabetes mellitus, type 2 ([place])   . Diverticulitis   . History of breast cancer    Followed yearly by [facility]  . Hyperlipemia   . Hypertension   . Nontraumatic retroperitoneal hematoma   . Osteoporosis   . Peripheral vascular disease [facility])    Past Surgical History:  Procedure Laterality Date  . APPENDECTOMY  1962  . BREAST SURGERY  02/01   Quadrectomy Breast cancer (Dr. Hassell Done)  . CARDIAC CATHETERIZATION  2000   Iliac Stent (Dr. Juanita Craver) yearly follow-up  . CARDIAC CATHETERIZATION  01/25/02   High grade LAD disease, total of 3 vess, involvement  . CARDIAC CATHETERIZATION  01/13/07   SV graft to diag. occluded but good backfill o/w ok  . CARDIAC CATHETERIZATION  02/14/10   PTCA & stent vein graft to OM/RCA ([place])  . CARDIAC CATHETERIZATION  04/17/2011   Saphenous vein grafts to the RCA 100%  occluded, to the OM 100% occluded, and the diagonal 100% occluded. Internal mammary artery to the LAD, widely patent.  Marland Kitchen CATARACT EXTRACTION  10/10 and 02/11  . COLOSTOMY    . CORONARY ARTERY BYPASS GRAFT  02/09/02   X 4 ([place])  . DILATION AND CURETTAGE OF UTERUS  1971    miscarrage, abnormal bleeding  . DILATION AND CURETTAGE OF UTERUS  1995   Menorrhagia  . HEMORRHOID SURGERY  09/11/04   Binding Hassell Done)  . PARTIAL COLECTOMY    . SHOULDER ARTHROSCOPY  05/04   Right, with rotator cuff debridement   Family History  Problem Relation Age of Onset  . Hypertension Mother   . Diabetes Mother   . Heart disease Mother   . Hypertension Father   . Heart disease Father 17       Heart attack  . Heart disease Brother        CHF  . Hypertension Brother   . COPD Brother        bronchial, frequent pneumonia  . Hypertension Brother   . Hyperlipidemia Brother   . Heart disease Brother        CABG  . Hypertension Brother   . Gout Brother   . Obesity Brother   . Diabetes Son   . Hypertension Son    Social History   Substance  and Sexual Activity  Sexual Activity No    Outpatient Encounter Medications as of 09/02/2017  Medication Sig  . amLODipine (NORVASC) 10 MG tablet Take one (1) tablet (10 mg) by mouth daily.  Marland Kitchen aspirin EC 81 MG tablet Take 81 mg by mouth daily.  Marland Kitchen atorvastatin (LIPITOR) 20 MG tablet TAKE 1 TABLET BY MOUTH AT BEDTIME  . BD INSULIN SYRINGE ULTRAFINE 31G X 5/16" 0.3 ML MISC USE DAILY AND AS DIRECTED SLIDING SCALE. MULTIPLE INJECTIONS OF INSULIN PER DAY  . benazepril (LOTENSIN) 20 MG tablet Take 1 tablet (20 mg total) by mouth daily.  . Cholecalciferol (VITAMIN D) 1000 UNITS capsule Take 1,000 Units by mouth 2 (two) times daily.    . fluticasone (FLOVENT HFA) 44 MCG/ACT inhaler Inhale 2 puffs into the lungs 2 (two) times daily.  Marland Kitchen glyBURIDE (DIABETA) 5 MG tablet TAKE 2 TABLETS (10 MG TOTAL) BY MOUTH DAILY WITH BREAKFAST.  Marland Kitchen meclizine (ANTIVERT) 25 MG tablet Take  1 tablet (25 mg total) by mouth daily as needed. For dizziness  . nitroGLYCERIN (NITROSTAT) 0.4 MG SL tablet Place 1 tablet (0.4 mg total) under the tongue as needed for chest pain. As directed  . NON FORMULARY Oxygen 2 liters 24/7  . NOVOLOG 100 UNIT/ML injection USE PER SLIDING SCALE--200-250=4-5 UNITS, 251-300=5-6 UNITS,301-350=6-7 UNITS, WITH MEALS  . nystatin (MYCOSTATIN/NYSTOP) 100000 UNIT/GM POWD Apply up to 3 times a day if needed.  . ONE TOUCH ULTRA TEST test strip USE TO CHECK BLOOD SUGAR 4 TIMES A DAY. INSULIN-DEPENDENT. DIAGNOSIS: E11.9  . Polyvinyl Alcohol-Povidone (REFRESH OP) Place 1 drop into both eyes daily as needed.  . ranolazine (RANEXA) 500 MG 12 hr tablet TAKE 1 TABLET (500 MG TOTAL) BY MOUTH 2 (TWO) TIMES DAILY.  . sotalol (BETAPACE) 80 MG tablet Take 0.5 tablets (40 mg total) by mouth 2 (two) times daily.   No facility-administered encounter medications on file as of 09/02/2017.     Activities of Daily Living In your present state of health, do you have any difficulty performing the following activities: 09/02/2017 11/24/2016  Hearing? [place]? N -  Difficulty concentrating or making decisions? N -  Walking or climbing stairs? Y -  Dressing or bathing? N -  Doing errands, shopping? N N  Preparing Food and eating ? N -  Using the Toilet? N -  In the past six months, have you accidently leaked urine? N -  Do you have problems with loss of bowel control? N -  Managing your Medications? N -  Managing your Finances? N -  Housekeeping or managing your Housekeeping? N -  Some recent data might be hidden    Patient Care Team: Tonia Ghent, MD as PCP - General (Family Medicine) Croitoru, Dani Gobble, MD as Consulting Physician (Cardiology) Dasher, Rayvon Char, MD as Referring Physician (Dermatology) Dingeldein, Remo Lipps, MD as Referring Physician (Ophthalmology) Chesley Mires, MD as Consulting Physician (Pulmonary Disease) Parrett, Fonnie Mu, NP as Nurse Practitioner  (Pulmonary Disease) Marc Morgans, DMD, PA as Referring Physician (Dentistry)    Assessment:     Hearing Screening   125Hz  250Hz  500Hz  1000Hz  2000Hz  3000Hz  4000Hz  6000Hz  8000Hz   Right ear:   0 0 0  0    Left ear:   0 0 0  0    Vision Screening Comments: Last vision exam in June 2018 with Dr. Sandra Cockayne   Exercise Activities and Dietary recommendations Current Exercise Habits: Home exercise routine, Type of exercise: walking, Time (Minutes): 20, Frequency (Times/Week): 7,  Weekly Exercise (Minutes/Week): 140, Intensity: Mild, Exercise limited by: None identified  Goals    . Increase physical activity     Starting 09/02/2017, I will continue to walk at least 20 min daily.       Fall Risk Fall Risk  09/02/2017 03/11/2016 01/03/2015  Falls in the past year? Yes No Yes  Number falls in past yr: 1 - 1  Injury with Fall? Yes - Yes   Depression Screen PHQ 2/9 Scores 09/02/2017 03/11/2016 01/03/2015  PHQ - 2 Score 0 0 0  PHQ- 9 Score 0 - -     Cognitive Function MMSE - Mini Mental State Exam 09/02/2017 03/11/2016  Orientation to time 5 5  Orientation to Place 5 5  Registration 3 3  Attention/ Calculation 0 0  Recall 3 3  Language- name 2 objects 0 0  Language- repeat 1 1  Language- follow 3 step command 3 3  Language- read & follow direction 0 0  Write a sentence 0 0  Copy design 0 0  Total score 20 20       PLEASE NOTE: A Mini-Cog screen was completed. Maximum score is 20. A value of 0 denotes this part of [place] MMSE was not completed or the patient failed this part of the Mini-Cog screening.   Mini-Cog Screening Orientation to Time - Max 5 pts Orientation to Place - Max 5 pts Registration - Max 3 pts Recall - Max 3 pts Language Repeat - Max 1 pts Language Follow 3 Step Command - Max 3 pts   Immunization History  Administered Date(s) Administered  . Influenza Split 07/09/2011  . Influenza Whole 08/05/2006, 07/28/2007, 07/02/2008  . Influenza,inj,Quad PF,6+ Mos  07/19/2013, 06/25/2014, 08/13/2015, 06/16/2016  . Influenza-Unspecified 07/07/2017  . Pneumococcal Conjugate-13 01/03/2015  . Pneumococcal Polysaccharide-23 11/02/2005  . Td 01/10/2001  . Tdap 09/18/2011, 06/16/2016  . Zoster 07/08/2006   Screening Tests Health Maintenance  Topic Date Due  . HEMOGLOBIN A1C  11/18/2017  . FOOT EXAM  02/22/2018  . OPHTHALMOLOGY EXAM  03/08/2018  . TETANUS/TDAP  06/16/2026  . INFLUENZA VACCINE  Completed  . DEXA SCAN  Completed  . PNA vac Low Risk Adult  Completed      Plan:     I have personally reviewed, addressed, and noted the following in the patient's chart:  A. Medical and social history B. Use of alcohol, tobacco or illicit drugs  C. Current medications and supplements D. Functional ability and status E.  Nutritional status F.  Physical activity G. Advance directives H. List of other physicians I.  Hospitalizations, surgeries, and ER visits in previous 12 months J.  [place] to include hearing, vision, cognitive, depression L. Referrals and appointments - none  In addition, I have reviewed and discussed with patient certain preventive protocols, quality metrics, and best practice recommendations. A written personalized care plan for preventive services as well as general preventive health recommendations were provided to patient.  See attached scanned questionnaire for additional information.   Signed,   Lindell Noe, MHA, BS, LPN Health Coach

## 2017-09-02 NOTE — Patient Instructions (Signed)
Ms. Greenberger , Thank you for taking time to come for your Medicare Wellness Visit. I appreciate your ongoing commitment to your health goals. Please review the following plan we discussed and let me know if I can assist you in the future.   These are the goals we discussed: Goals    . Increase physical activity     Starting 09/02/2017, I will continue to walk at least 20 min daily.        This is a list of the screening recommended for you and due dates:  Health Maintenance  Topic Date Due  . Hemoglobin A1C  11/18/2017  . Complete foot exam   02/22/2018  . Eye exam for diabetics  03/08/2018  . Tetanus Vaccine  06/16/2026  . Flu Shot  Completed  . DEXA scan (bone density measurement)  Completed  . Pneumonia vaccines  Completed   Preventive Care for Adults  A healthy lifestyle and preventive care can promote health and wellness. Preventive health guidelines for adults include the following key practices.  . A routine yearly physical is a good way to check with your health care provider about your health and preventive screening. It is a chance to share any concerns and updates on your health and to receive a thorough exam.  . Visit your dentist for a routine exam and preventive care every 6 months. Brush your teeth twice a day and floss once a day. Good oral hygiene prevents tooth decay and gum disease.  . The frequency of eye exams is based on your age, health, family medical history, use  of contact lenses, and other factors. Follow your health care provider's recommendations for frequency of eye exams.  . Eat a healthy diet. Foods like vegetables, fruits, whole grains, low-fat dairy products, and lean protein foods contain the nutrients you need without too many calories. Decrease your intake of foods high in solid fats, added sugars, and salt. Eat the right amount of calories for you. Get information about a proper diet from your health care provider, if necessary.  . Regular  physical exercise is one of the most important things you can do for your health. Most adults should get at least 150 minutes of moderate-intensity exercise (any activity that increases your heart rate and causes you to sweat) each week. In addition, most adults need muscle-strengthening exercises on 2 or more days a week.  Silver Sneakers may be a benefit available to you. To determine eligibility, you may visit the website: www.silversneakers.com or contact program at 540-389-5979 Mon-Fri between 8AM-8PM.   . Maintain a healthy weight. The body mass index (BMI) is a screening tool to identify possible weight problems. It provides an estimate of body fat based on height and weight. Your health care provider can find your BMI and can help you achieve or maintain a healthy weight.   For adults 20 years and older: ? A BMI below 18.5 is considered underweight. ? A BMI of 18.5 to 24.9 is normal. ? A BMI of 25 to 29.9 is considered overweight. ? A BMI of 30 and above is considered obese.   . Maintain normal blood lipids and cholesterol levels by exercising and minimizing your intake of saturated fat. Eat a balanced diet with plenty of fruit and vegetables. Blood tests for lipids and cholesterol should begin at age 11 and be repeated every 5 years. If your lipid or cholesterol levels are high, you are over 50, or you are at high risk for heart  disease, you may need your cholesterol levels checked more frequently. Ongoing high lipid and cholesterol levels should be treated with medicines if diet and exercise are not working.  . If you smoke, find out from your health care provider how to quit. If you do not use tobacco, please do not start.  . If you choose to drink alcohol, please do not consume more than 2 drinks per day. One drink is considered to be 12 ounces (355 mL) of beer, 5 ounces (148 mL) of wine, or 1.5 ounces (44 mL) of liquor.  . If you are 68-5 years old, ask your health care provider if  you should take aspirin to prevent strokes.  . Use sunscreen. Apply sunscreen liberally and repeatedly throughout the day. You should seek shade when your shadow is shorter than you. Protect yourself by wearing long sleeves, pants, a wide-brimmed hat, and sunglasses year round, whenever you are outdoors.  . Once a month, do a whole body skin exam, using a mirror to look at the skin on your back. Tell your health care provider of new moles, moles that have irregular borders, moles that are larger than a pencil eraser, or moles that have changed in shape or color.

## 2017-09-02 NOTE — Progress Notes (Signed)
Pre visit review using our clinic review tool, if applicable. No additional management support is needed unless otherwise documented below in the visit note. 

## 2017-09-02 NOTE — Progress Notes (Signed)
PCP notes:   Health maintenance:  No gaps identified.  Abnormal screenings:   Fall risk - hx of fall with injury and medical treatment  Hearing - failed  Hearing Screening   125Hz  250Hz  500Hz  1000Hz  2000Hz  3000Hz  4000Hz  6000Hz  8000Hz   Right ear:   0 0 0  0    Left ear:   0 0 0  0     Patient concerns:   None  Nurse concerns:  None  Next PCP appt:   09/08/17 @ 1515  I reviewed health advisor's note, was available for consultation on the day of service listed in this note, and agree with documentation and plan. [name], MD.

## 2017-09-06 ENCOUNTER — Ambulatory Visit (HOSPITAL_COMMUNITY)
Admission: RE | Admit: 2017-09-06 | Discharge: 2017-09-06 | Disposition: A | Discharge status: Home / Self Care | Payer: Medicare Other | Source: Ambulatory Visit | Attending: Family Medicine | Admitting: Family Medicine

## 2017-09-06 DIAGNOSIS — K862 Cyst of pancreas: Secondary | ICD-10-CM | POA: Diagnosis not present

## 2017-09-06 DIAGNOSIS — N281 Cyst of kidney, acquired: Secondary | ICD-10-CM | POA: Insufficient documentation

## 2017-09-06 DIAGNOSIS — N2889 Other specified disorders of kidney and ureter: Secondary | ICD-10-CM

## 2017-09-06 MED ORDER — GADOBENATE DIMEGLUMINE 529 MG/ML IV SOLN
13.0000 mL | Freq: Once | INTRAVENOUS | Status: AC
  Administered 2017-09-06: 13 mL via INTRAVENOUS

## 2017-09-08 ENCOUNTER — Ambulatory Visit (INDEPENDENT_AMBULATORY_CARE_PROVIDER_SITE_OTHER): Payer: Medicare Other | Admitting: Family Medicine

## 2017-09-08 ENCOUNTER — Encounter: Payer: Self-pay | Admitting: Family Medicine

## 2017-09-08 VITALS — BP 130/64 | HR 68 | Temp 97.8°F | Wt 133.0 lb

## 2017-09-08 DIAGNOSIS — I25708 Atherosclerosis of coronary artery bypass graft(s), unspecified, with other forms of angina pectoris: Secondary | ICD-10-CM | POA: Diagnosis not present

## 2017-09-08 DIAGNOSIS — N281 Cyst of kidney, acquired: Secondary | ICD-10-CM | POA: Diagnosis not present

## 2017-09-08 DIAGNOSIS — R7989 Other specified abnormal findings of blood chemistry: Secondary | ICD-10-CM | POA: Diagnosis not present

## 2017-09-08 DIAGNOSIS — Z0000 Encounter for general adult medical examination without abnormal findings: Secondary | ICD-10-CM

## 2017-09-08 DIAGNOSIS — E119 Type 2 diabetes mellitus without complications: Secondary | ICD-10-CM

## 2017-09-08 DIAGNOSIS — G4730 Sleep apnea, unspecified: Secondary | ICD-10-CM

## 2017-09-08 DIAGNOSIS — K862 Cyst of pancreas: Secondary | ICD-10-CM | POA: Diagnosis not present

## 2017-09-08 DIAGNOSIS — E1159 Type 2 diabetes mellitus with other circulatory complications: Secondary | ICD-10-CM

## 2017-09-08 DIAGNOSIS — Z7189 Other specified counseling: Secondary | ICD-10-CM

## 2017-09-08 NOTE — Patient Instructions (Addendum)
Check with your insurance to see if they will cover the shingrix shot.  1. Minimally complex multi-cystic lesion of the LEFT kidney. Minimal suspicion of cancer. Consider follow-up CT or MRI in 6 to 12 months. 2. Cystic lesion of the tail the pancreas not changed in size in 7 month interval is most likely postinflammatory cystic change. Recommend follow-up imaging in 2 years per consensus criteria.   Don't change your meds for now.  Update me as needed.  Recheck in 3-4 months, labs ahead of time or at the visit.  Take care.  Glad to see you.

## 2017-09-08 NOTE — Progress Notes (Signed)
Shingles vaccine d/w pt.    MRI results d/w pt.  1. Minimally complex multi-cystic lesion of the LEFT kidney. Minimal suspicion of cancer. Consider follow-up CT or MRI in 6 to 12 months. 2. Cystic lesion of the tail the pancreas not changed in size in 7 month interval is most likely postinflammatory cystic change. Recommend follow-up imaging in 2 years per consensus criteria.   Fall risk - hx of fall with injury and medical treatment.  Using a cane.  Has a fall button.  Cautions d/w pt.  Per patient she can safe live independently, d/w pt.   Hearing - failed.  Declined hearing aids at this point.    Mammogram declined/deferred at this point.  DXA declined.   Advance directive- son and daughter equally designated if patient were incapacitated.   OSA and CPAP.  Continue on CPAP at bedtime with oxygen .  Continues on Oxygen 2l/m with activity. Compliant.    No CP, not SOB o/w.  Still with some mild L leg edema.    H/o abnormal CBC- order was placed but not drawn, explained to patient d/w lab about future draws.  Offered recheck cbc today and patient wanted to defer.  No FCNAVD or "B" sx, so this is reasonable.  Order placed for fu CBC.    Diabetes:  Using medications without difficulties: yes Hypoglycemic episodes: no Hyperglycemic episodes: only with diet indiscretion with the holidays Feet problems: no tingling.   Blood Sugars averaging: usually ~100 in the AMs, occ higher later in the day, corrected with mealtime insulin.   eye exam within last year:yes Goal is to avoid low sugars, d/w pt.   Goal A1c ~8.  D/w pt.    PMH and SH reviewed  Meds, vitals, and allergies reviewed.   ROS: Per HPI unless specifically indicated in ROS section   GEN: nad, alert and oriented HEENT: mucous membranes moist NECK: supple w/o LA CV: rrr. PULM: ctab, no inc wob ABD: soft, +bs EXT: no edema on R ankle, 1+ on L ankle at baseline.   SKIN: no acute rash

## 2017-09-09 ENCOUNTER — Other Ambulatory Visit: Payer: Self-pay | Admitting: Pulmonary Disease

## 2017-09-09 DIAGNOSIS — Z0000 Encounter for general adult medical examination without abnormal findings: Secondary | ICD-10-CM | POA: Insufficient documentation

## 2017-09-09 DIAGNOSIS — N281 Cyst of kidney, acquired: Secondary | ICD-10-CM | POA: Insufficient documentation

## 2017-09-09 NOTE — Assessment & Plan Note (Signed)
order was placed but not drawn, explained to patient d/w lab about future draws.  Offered recheck cbc today and patient wanted to defer.  No FCNAVD or "B" sx, so this is reasonable.  Order placed for fu CBC.

## 2017-09-09 NOTE — Assessment & Plan Note (Signed)
MRI results d/w pt.  1. Minimally complex multi-cystic lesion of the LEFT kidney. Minimal suspicion of cancer. Consider follow-up CT or MRI in 6 to 12 months. 2. Cystic lesion of the tail the pancreas not changed in size in 7 month interval is most likely postinflammatory cystic change. Recommend follow-up imaging in 2 years per consensus criteria.

## 2017-09-09 NOTE — Assessment & Plan Note (Signed)
Advance directive- son and daughter equally designated if patient were incapacitated.  ?

## 2017-09-09 NOTE — Assessment & Plan Note (Signed)
  Fall risk - hx of fall with injury and medical treatment.  Using a cane.  Has a fall button.  Cautions d/w pt.  Per patient she can safe live independently, d/w pt.   Hearing - failed.  Declined hearing aids at this point.    Mammogram declined/deferred at this point.  DXA declined.   Advance directive- son and daughter equally designated if patient were incapacitated.

## 2017-09-09 NOTE — Assessment & Plan Note (Signed)
Continue on CPAP at bedtime with oxygen .  Continues on Oxygen 2l/m with activity.

## 2017-09-09 NOTE — Assessment & Plan Note (Signed)
Blood Sugars averaging: usually ~100 in the AMs, occ higher later in the day, corrected with mealtime insulin.   Goal is to avoid low sugars, d/w pt.   Goal A1c ~8.  D/w pt.   Continue meds as is, with continued work on diet.  She agrees.  Recheck periodically.  >25 minutes spent in face to face time with patient, >50% spent in counselling or coordination of care.

## 2017-10-06 ENCOUNTER — Ambulatory Visit: Payer: Self-pay | Admitting: *Deleted

## 2017-10-06 NOTE — Telephone Encounter (Signed)
Patient states she has had congestion since 12/10 that has increasing gotten worse. She states she has good days and bad days and her O2 sat are reading all over the place. Her pulse has remained stable. She is coughing with heavy congestion. Patient wants to see her PCP- appointment given- reviewed reasons to go to ED if she should get worse before appointment. Reason for Disposition . [1] Longstanding difficulty breathing (e.g., CHF, COPD, emphysema) AND [2] WORSE than normal  Answer Assessment - Initial Assessment Questions 1. RESPIRATORY STATUS: "Describe your breathing?" (e.g., wheezing, shortness of breath, unable to speak, severe coughing)      SOB, coughing 2. ONSET: "When did this breathing problem begin?"      12/10- started - and patient has not been able to shake it- worse since 12/25 3. PATTERN "Does the difficult breathing come and go, or has it been constant since it started?"      Comes and goes 4. SEVERITY: "How bad is your breathing?" (e.g., mild, moderate, severe)    - MILD: No SOB at rest, mild SOB with walking, speaks normally in sentences, can lay down, no retractions, pulse < 100.    - MODERATE: SOB at rest, SOB with minimal exertion and prefers to sit, cannot lie down flat, speaks in phrases, mild retractions, audible wheezing, pulse 100-120.    - SEVERE: Very SOB at rest, speaks in single words, struggling to breathe, sitting hunched forward, retractions, pulse > 120      Moderate to severe- O2 sat- 79 to 97- bounces around 5. RECURRENT SYMPTOM: "Have you had difficulty breathing before?" If so, ask: "When was the last time?" and "What happened that time?"      Cough and congestion related- has not had this hard of time getting rid of it 6. CARDIAC HISTORY: "Do you have any history of heart disease?" (e.g., heart attack, angina, bypass surgery, angioplasty)      Open heart surgery 7. LUNG HISTORY: "Do you have any history of lung disease?"  (e.g., pulmonary embolus,  asthma, emphysema)     Asthma- using O2 8. CAUSE: "What do you think is causing the breathing problem?"      Congestion in chest 9. OTHER SYMPTOMS: "Do you have any other symptoms? (e.g., dizziness, runny nose, cough, chest pain, fever)     Cough, runny nose, brief feeling of fever 10. PREGNANCY: "Is there any chance you are pregnant?" "When was your last menstrual period?"       n/a 11. TRAVEL: "Have you traveled out of the country in the last month?" (e.g., travel history, exposures)       no  Protocols used: BREATHING DIFFICULTY-A-AH

## 2017-10-07 ENCOUNTER — Ambulatory Visit (INDEPENDENT_AMBULATORY_CARE_PROVIDER_SITE_OTHER): Payer: Medicare Other | Admitting: Internal Medicine

## 2017-10-07 ENCOUNTER — Encounter: Payer: Self-pay | Admitting: Internal Medicine

## 2017-10-07 VITALS — BP 124/62 | HR 71 | Temp 98.1°F | Wt 131.5 lb

## 2017-10-07 DIAGNOSIS — J9612 Chronic respiratory failure with hypercapnia: Secondary | ICD-10-CM

## 2017-10-07 DIAGNOSIS — J4520 Mild intermittent asthma, uncomplicated: Secondary | ICD-10-CM | POA: Diagnosis not present

## 2017-10-07 DIAGNOSIS — J069 Acute upper respiratory infection, unspecified: Secondary | ICD-10-CM | POA: Diagnosis not present

## 2017-10-07 MED ORDER — AMOXICILLIN-POT CLAVULANATE 875-125 MG PO TABS: 1.0000 | ORAL_TABLET | Freq: Two times a day (BID) | ORAL | 0 refills | Status: DC

## 2017-10-07 MED ORDER — BENZONATATE 100 MG PO CAPS: 100.0000 mg | ORAL_CAPSULE | Freq: Two times a day (BID) | ORAL | 0 refills | Status: DC | PRN

## 2017-10-07 NOTE — Progress Notes (Signed)
HPI  Pt presents to the clinic today with c/o runny nose, cough and chest congestion. She reports this started 12/10 but has gotten worse since 12/25. She is blowing yellow mucous out of her nose. The cough is productive of yellow/green/brown mucous. She has felt short of breath at times. She wears O2 continuously, and reports her sats bounce around between 79-97 on L. She is not sure if she has run fevers, but she has felt like it. She denies chills or body aches but has felt fatigued. She has not taken anything OTC. She has not had sick contacts that she is aware of. She has a history of Asthma, Afib, DM2, CHF, and CHRF.  Review of Systems      Past Medical History:  Diagnosis Date  . Anemia, iron deficiency   . Atrial fibrillation ([place]) 11/28/2010   2D Echo - EF-30-35, left atrium moderate to severely dilated, right ventricle moderately dilated, tricuspid valve mild-moderate regurgitation  . Cancer [facility])    R beast cancer followed by Dr. Hassell Done  . CAP (community acquired pneumonia)   . Chronic kidney disease (CKD), stage II (mild)   . Coronary artery disease   . Diabetes mellitus, type 2 ([place])   . Diverticulitis   . History of breast cancer    Followed yearly by [facility]  . Hyperlipemia   . Hypertension   . Nontraumatic retroperitoneal hematoma   . Osteoporosis   . Peripheral vascular disease ([place])     Family History  Problem Relation Age of Onset  . Hypertension Mother   . Diabetes Mother   . Heart disease Mother   . Hypertension Father   . Heart disease Father 46       Heart attack  . Heart disease Brother        CHF  . Hypertension Brother   . COPD Brother        bronchial, frequent pneumonia  . Hypertension Brother   . Hyperlipidemia Brother   . Heart disease Brother        CABG  . Hypertension Brother   . Gout Brother   . Obesity Brother   . Diabetes Son   . Hypertension Son   . Breast cancer Maternal Aunt   . Colon cancer Neg Hx     Social History    Socioeconomic History  . Marital status: Widowed    Spouse name: Not on file  . Number of children: 2  . Years of education: Not on file  . Highest education level: Not on file  Social Needs  . Financial resource strain: Not on file  . Food insecurity - worry: Not on file  . Food insecurity - inability: Not on file  . Transportation needs - medical: Not on file  . Transportation needs - non-medical: Not on file  Occupational History  . Occupation: Retired  Tobacco Use  . Smoking status: Never Smoker  . Smokeless tobacco: Never Used  Substance and Sexual Activity  . Alcohol use: No    Alcohol/week: 0.0 oz  . Drug use: No  . Sexual activity: No  Other Topics Concern  . Not on file  Social History Narrative   Retired 1991 from [employer] work   Widowed as of 04/2011 after 50+ years   Lives alone   2 kids in [place]   Enjoys travelling and reading    Allergies  Allergen Reactions  . Actos [Pioglitazone] Other (See Comments)    edema  . Metformin And  Related Other (See Comments)    Held due to renal function  . Zocor [Simvastatin] Other (See Comments)    Myalgias     Constitutional: Positive fatigue. Denies headache, fever or abrupt weight changes.  HEENT:  Positive runny nose. Denies eye redness, eye pain, pressure behind the eyes, facial pain, nasal congestion, ear pain, ringing in the ears, wax buildup, sore throat or bloody nose. Respiratory: Positive cough and shortness of breath. Denies difficulty breathing.  Cardiovascular: Denies chest pain, chest tightness, palpitations or swelling in the hands or feet.   No other specific complaints in a complete review of systems (except as listed in HPI above).  Objective:   BP 124/62   Pulse 71   Temp 98.1 F (36.7 C) (Oral)   Wt 131 lb 8 oz (59.6 kg)   SpO2 91%   BMI 24.85 kg/m   Wt Readings from Last 3 Encounters:  09/08/17 133 lb (60.3 kg)  09/02/17 133 lb 8 oz (60.6 kg)  08/30/17 132 lb (59.9 kg)      General: Appears her stated age, chronically ill appearing, in NAD. HEENT: Head: normal shape and size, no sinus tenderness noted; Ears: Tm's gray and intact, normal light reflex; Nose: mucosa pink and moist, septum midline; Throat/Mouth:Teeth present, mucosa erythematous and moist, no exudate noted, no lesions or ulcerations noted.  Neck: No cervical lymphadenopathy.  Cardiovascular: Normal rate and rhythm. S1,S2 noted. Intermittent ectopic beat noted. No murmur, rubs or gallops noted.  Pulmonary/Chest: Slightly increased effort and positive vesicular breath sounds. No respiratory distress. No wheezes, rales or ronchi noted.       Assessment & Plan:   Upper Respiratory Infection:  Get some rest and drink plenty of water Given duration of symptoms and chronic comorbidites, will treat with Augmentin BID x 7 days eRx for Tessalon Pearls as well  RTC as needed or if symptoms persist.   Webb Silversmith, NP

## 2017-10-07 NOTE — Patient Instructions (Signed)
Upper Respiratory Infection, Adult Most upper respiratory infections (URIs) are caused by a virus. A URI affects the nose, throat, and upper air passages. The most common type of URI is often called "the common cold." Follow these instructions at home:  Take medicines only as told by your doctor.  Gargle warm saltwater or take cough drops to comfort your throat as told by your doctor.  Use a warm mist humidifier or inhale steam from a shower to increase air moisture. This may make it easier to breathe.  Drink enough fluid to keep your pee (urine) clear or pale yellow.  Eat soups and other clear broths.  Have a healthy diet.  Rest as needed.  Go back to work when your fever is gone or your doctor says it is okay. ? You may need to stay home longer to avoid giving your URI to others. ? You can also wear a face mask and wash your hands often to prevent spread of the virus.  Use your inhaler more if you have asthma.  Do not use any tobacco products, including cigarettes, chewing tobacco, or electronic cigarettes. If you need help quitting, ask your doctor. Contact a doctor if:  You are getting worse, not better.  Your symptoms are not helped by medicine.  You have chills.  You are getting more short of breath.  You have brown or red mucus.  You have yellow or brown discharge from your nose.  You have pain in your face, especially when you bend forward.  You have a fever.  You have puffy (swollen) neck glands.  You have pain while swallowing.  You have white areas in the back of your throat. Get help right away if:  You have very bad or constant: ? Headache. ? Ear pain. ? Pain in your forehead, behind your eyes, and over your cheekbones (sinus pain). ? Chest pain.  You have long-lasting (chronic) lung disease and any of the following: ? Wheezing. ? Long-lasting cough. ? Coughing up blood. ? A change in your usual mucus.  You have a stiff neck.  You have  changes in your: ? Vision. ? Hearing. ? Thinking. ? Mood. This information is not intended to replace advice given to you by your health care provider. Make sure you discuss any questions you have with your health care provider. Document Released: 03/09/2008 Document Revised: 05/24/2016 Document Reviewed: 12/27/2013 Elsevier Interactive Patient Education  2018 Elsevier Inc.  

## 2017-10-25 ENCOUNTER — Other Ambulatory Visit: Payer: Self-pay | Admitting: *Deleted

## 2017-10-25 MED ORDER — INSULIN ASPART 100 UNIT/ML ~~LOC~~ SOLN: SUBCUTANEOUS | 3 refills | Status: DC

## 2017-10-25 MED ORDER — INSULIN ASPART 100 UNIT/ML ~~LOC~~ SOLN: SUBCUTANEOUS | 5 refills | Status: DC

## 2017-12-09 ENCOUNTER — Other Ambulatory Visit: Payer: Self-pay | Admitting: Family Medicine

## 2017-12-18 ENCOUNTER — Other Ambulatory Visit: Payer: Self-pay | Admitting: Family Medicine

## 2017-12-21 ENCOUNTER — Other Ambulatory Visit (INDEPENDENT_AMBULATORY_CARE_PROVIDER_SITE_OTHER): Payer: Medicare Other

## 2017-12-21 DIAGNOSIS — E119 Type 2 diabetes mellitus without complications: Secondary | ICD-10-CM

## 2017-12-21 LAB — CBC WITH DIFFERENTIAL/PLATELET
BASOS PCT: 0.7 % (ref 0.0–3.0)
Basophils Absolute: 0.1 10*3/uL (ref 0.0–0.1)
EOS ABS: 0.1 10*3/uL (ref 0.0–0.7)
EOS PCT: 0.9 % (ref 0.0–5.0)
HCT: 35.3 % — ABNORMAL LOW (ref 36.0–46.0)
HEMOGLOBIN: 11.7 g/dL — AB (ref 12.0–15.0)
LYMPHS PCT: 22.9 % (ref 12.0–46.0)
Lymphs Abs: 1.8 10*3/uL (ref 0.7–4.0)
MCHC: 33 g/dL (ref 30.0–36.0)
MCV: 91.2 fl (ref 78.0–100.0)
MONOS PCT: 27.3 % — AB (ref 3.0–12.0)
Monocytes Absolute: 2.1 10*3/uL — ABNORMAL HIGH (ref 0.1–1.0)
Neutro Abs: 3.7 10*3/uL (ref 1.4–7.7)
Neutrophils Relative %: 48.2 % (ref 43.0–77.0)
Platelets: 242 10*3/uL (ref 150.0–400.0)
RBC: 3.87 Mil/uL (ref 3.87–5.11)
RDW: 15 % (ref 11.5–15.5)
WBC: 7.7 10*3/uL (ref 4.0–10.5)

## 2017-12-21 LAB — BASIC METABOLIC PANEL
BUN: 23 mg/dL (ref 6–23)
CO2: 30 mEq/L (ref 19–32)
CREATININE: 1.08 mg/dL (ref 0.40–1.20)
Calcium: 9.6 mg/dL (ref 8.4–10.5)
Chloride: 103 mEq/L (ref 96–112)
GFR: 51.83 mL/min — AB (ref >60.00)
GLUCOSE: 117 mg/dL — AB (ref 70–99)
POTASSIUM: 4.9 meq/L (ref 3.5–5.1)
Sodium: 139 mEq/L (ref 135–145)

## 2017-12-21 LAB — HEMOGLOBIN A1C: Hgb A1c MFr Bld: 7.6 % — ABNORMAL HIGH (ref 4.6–6.5)

## 2017-12-24 ENCOUNTER — Ambulatory Visit (INDEPENDENT_AMBULATORY_CARE_PROVIDER_SITE_OTHER): Payer: Medicare Other | Admitting: Family Medicine

## 2017-12-24 VITALS — BP 122/60 | HR 64 | Temp 97.9°F | Wt 131.0 lb

## 2017-12-24 DIAGNOSIS — R7989 Other specified abnormal findings of blood chemistry: Secondary | ICD-10-CM

## 2017-12-24 DIAGNOSIS — E1159 Type 2 diabetes mellitus with other circulatory complications: Secondary | ICD-10-CM

## 2017-12-24 NOTE — Patient Instructions (Addendum)
You can always give a lower dose of insulin at night to limit the risk of a low sugar.  At night, you can take away 1-2 units from your typical daytime sliding scale.   Recheck in about 3-4 months, labs ahead of time if possible.  Take care.  Glad to see you. Tamala Julian

## 2017-12-24 NOTE — Progress Notes (Signed)
Diabetes:  Using medications without difficulties:yes Hypoglycemic episodes:only if she had used higher dose of insulin, d/w pt, this was rare.  If it happens it is usually nocturnal.  D/w pt about snack prior to bed.  We talked about allowing higher sugar at night to limit lower sugars in the middle of the night Hyperglycemic episodes: occ episodes with >300 after a big meal, higher carb intake.  Feet problems:no Blood Sugars averaging: usually ~100 on typical mornings.  eye exam within last year:yes  CBC d/w pt. Monocyte elevation at baseline, d/w pt.  This appears stable.    She has an alert button.  She uses her cane.  No recent falls.  The cane helps with walking.    PMH and SH reviewed  Meds, vitals, and allergies reviewed.   ROS: Per HPI unless specifically indicated in ROS section   GEN: nad, alert and oriented HEENT: mucous membranes moist NECK: supple w/o LA CV: rrr. PULM: ctab, no inc wob ABD: soft, +bs EXT: no edema SKIN: no acute rash

## 2017-12-26 ENCOUNTER — Encounter: Payer: Self-pay | Admitting: Family Medicine

## 2017-12-26 NOTE — Assessment & Plan Note (Signed)
  CBC d/w pt. Monocyte elevation at baseline, d/w pt.  This appears stable.

## 2017-12-26 NOTE — Assessment & Plan Note (Signed)
A1c is better but I am worried about the risk of relative hypoglycemia.  Discussed with patient. She can give a lower dose of insulin at night to limit the risk of a low sugar.  At night, she can take away 1-2 units from her typical daytime sliding scale.   Recheck in about 3-4 months, labs ahead of time if possible.  She agrees.

## 2017-12-28 ENCOUNTER — Ambulatory Visit (INDEPENDENT_AMBULATORY_CARE_PROVIDER_SITE_OTHER): Payer: Medicare Other | Admitting: Pulmonary Disease

## 2017-12-28 ENCOUNTER — Encounter: Payer: Self-pay | Admitting: Pulmonary Disease

## 2017-12-28 VITALS — BP 132/68 | HR 62 | Ht 61.0 in | Wt 131.0 lb

## 2017-12-28 DIAGNOSIS — J9611 Chronic respiratory failure with hypoxia: Secondary | ICD-10-CM | POA: Diagnosis not present

## 2017-12-28 DIAGNOSIS — J9612 Chronic respiratory failure with hypercapnia: Secondary | ICD-10-CM

## 2017-12-28 DIAGNOSIS — J4530 Mild persistent asthma, uncomplicated: Secondary | ICD-10-CM | POA: Diagnosis not present

## 2017-12-28 DIAGNOSIS — G4730 Sleep apnea, unspecified: Secondary | ICD-10-CM

## 2017-12-28 DIAGNOSIS — J984 Other disorders of lung: Secondary | ICD-10-CM | POA: Diagnosis not present

## 2017-12-28 DIAGNOSIS — M419 Scoliosis, unspecified: Secondary | ICD-10-CM

## 2017-12-28 NOTE — Patient Instructions (Signed)
Follow up in 1 year.

## 2017-12-28 NOTE — Progress Notes (Signed)
[facility]  Chief Complaint  Patient presents with  . Follow-up    ROV    Vital signs: BP 132/68 (BP Location: Left Arm, Cuff Size: Normal)   Pulse 62   Ht 5\' 1"  (1.549 m)   Wt 131 lb (59.4 kg)   SpO2 99%   BMI 24.75 kg/m   History of Present Illness: [name] is a 83 y.o. female chronic respiratory failure with hypoxia and hypercapnia with sleep disordered breathing in setting of restrictive lung disease with kyphoscoliosis and asthma.  She has been doing well with CPAP.  Only issue is air leak from ask.  She uses 2 liters oxygen.  She sleeps okay.  Feels rested.  Not having cough, wheeze, sputum, fever, chest pain, sinus congestion, sore throat, swelling, or hemoptysis.  Physical Exam:  General - pleasant Eyes - pupils reactive ENT - no sinus tenderness, no oral exudate, no LAN Cardiac - regular, no murmur Chest - no wheeze, rales, kyphotic Abd - soft, non tender Ext - no edema Skin - no rashes Neuro - normal strength Psych - normal mood   Assessment/Plan:  Chronic hypoxic/hypercapnic respiratory failure with sleep disordered breathing in the setting of sleep apnea, and kyphoscoliosis with restrictive lung disease. - continue CPAP 12 cm H2O with 2 liters oxygen - she is compliant with therapy and reports benefit - discussed options to assist with mask fit and reduce air leak  Chronic obstructive asthma. - continue flovent   Patient Instructions  Follow up in 1 year     Chesley Mires, MD [place] 12/28/2017, 11:24 AM Pager:  [phone]  Flow Sheet  Pulmonary tests: Ambulatory oximetry 01/27/17 >> SpO2 86% after 2 laps.  Placed on 2 liters PFT 02/04/17 >> FEV1 0.73 (45%), FEV1% 77, TLC 2.84 (63%), DLCO 45%, + BD CT chest 02/05/17 >> atherosclerosis, s/p CABG, mild pleural thickening b/l, 4 mm RLL nodule, healed rib fx's, kyphosis, 2.6 cm low density lesion in pancreatic tail  Cardiac  tests Echo 01/31/16 >> EF 60 to 65%  Sleep tests: ONO with 2 liters 02/04/17 >> test time 7 hrs 52 min.  Average SpO2 95%, low SpO2 83%.  Spent 5 min with SpO2 < 88%. PSG 04/13/17 >> RDI 33.9.  Test done with 2 liters oxygen. CPAP titration 06/01/17 >> CPAP 12 with 2 liters oxygen CPAP 11/27/17 to 12/26/17 >> used on 30 of 30 nights with average 7 hrs 40 min. Average AHI 2.5 with CPAP 12 cm H2O.  Air leak.  Past Medical History: She  has a past medical history of Anemia, iron deficiency, Atrial fibrillation ([place]) (11/28/2010), Cancer ([place]), CAP (community acquired pneumonia), Chronic kidney disease (CKD), stage II (mild), Coronary artery disease, Diabetes mellitus, type 2 ([place]), Diverticulitis, History of breast cancer, Hyperlipemia, Hypertension, Nontraumatic retroperitoneal hematoma, Osteoporosis, and Peripheral vascular disease ([place]).  Past Surgical History: She  has a past surgical history that includes Appendectomy (1962); Dilation and curettage of uterus (1971 ); Dilation and curettage of uterus (1995); Breast surgery (02/01); Coronary artery bypass graft (02/09/02); Shoulder arthroscopy (05/04); Hemorrhoid surgery (09/11/04); Cataract extraction (10/10 and 02/11); Colostomy; Partial colectomy; Cardiac catheterization (2000); Cardiac catheterization (01/25/02); Cardiac catheterization (01/13/07); Cardiac catheterization (02/14/10); and Cardiac catheterization (04/17/2011).  Family History: Her family history includes Breast cancer in her maternal aunt; COPD in her brother; Diabetes in her mother and son; Gout in her brother; Heart disease in her brother, brother, and mother; Heart disease (age of onset: 48) in  her father; Hyperlipidemia in her brother; Hypertension in her brother, brother, brother, father, mother, and son; Obesity in her brother.  Social History: She  reports that she has never smoked. She has never used smokeless tobacco. She reports that she does not drink alcohol or use  drugs.  Medications: Allergies as of 12/28/2017      Reactions   Actos [pioglitazone] Other (See Comments)   edema   Metformin And Related Other (See Comments)   Held due to renal function   Zocor [simvastatin] Other (See Comments)   Myalgias      Medication List        Accurate as of 12/28/17 11:24 AM. Always use your most recent med list.          amLODipine 10 MG tablet Commonly known as:  NORVASC Take one (1) tablet (10 mg) by mouth daily.   aspirin EC 81 MG tablet Take 81 mg by mouth daily.   atorvastatin 20 MG tablet Commonly known as:  LIPITOR TAKE 1 TABLET BY MOUTH AT BEDTIME   BD INSULIN SYRINGE U/F 31G X 5/16" 0.3 ML Misc Generic drug:  Insulin Syringe-Needle U-100 USE DAILY AND AS DIRECTED SLIDING SCALE. MULTIPLE INJECTIONS OF INSULIN PER DAY   benazepril 20 MG tablet Commonly known as:  LOTENSIN Take 1 tablet (20 mg total) by mouth daily.   FLOVENT HFA 44 MCG/ACT inhaler Generic drug:  fluticasone TAKE 2 PUFFS BY MOUTH TWICE A DAY   glyBURIDE 5 MG tablet Commonly known as:  DIABETA TAKE 2 TABLETS (10 MG TOTAL) BY MOUTH DAILY WITH BREAKFAST.   insulin aspart 100 UNIT/ML injection Commonly known as:  NOVOLOG USE PER SLIDING SCALE--200-250=4-5 UNITS, 251-300=5-6 UNITS,301-350=6-7 UNITS, WITH MEALS   meclizine 25 MG tablet Commonly known as:  ANTIVERT Take 1 tablet (25 mg total) by mouth daily as needed. For dizziness   nitroGLYCERIN 0.4 MG SL tablet Commonly known as:  NITROSTAT Place 1 tablet (0.4 mg total) under the tongue as needed for chest pain. As directed   NON FORMULARY Oxygen 2 liters 24/7   nystatin powder Generic drug:  nystatin Apply up to 3 times a day if needed.   ONE TOUCH ULTRA TEST test strip Generic drug:  glucose blood USE TO CHECK BLOOD SUGAR 4 TIMES A DAY. INSULIN-DEPENDENT. DIAGNOSIS: E11.9   ranolazine 500 MG 12 hr tablet Commonly known as:  RANEXA TAKE 1 TABLET (500 MG TOTAL) BY MOUTH 2 (TWO) TIMES DAILY.    REFRESH OP Place 1 drop into both eyes daily as needed.   sotalol 80 MG tablet Commonly known as:  BETAPACE Take 0.5 tablets (40 mg total) by mouth 2 (two) times daily.   Vitamin D 1000 units capsule Take 1,000 Units by mouth 2 (two) times daily.

## 2018-01-12 ENCOUNTER — Encounter: Payer: Self-pay | Admitting: Family Medicine

## 2018-01-12 ENCOUNTER — Ambulatory Visit (INDEPENDENT_AMBULATORY_CARE_PROVIDER_SITE_OTHER): Payer: Medicare Other | Admitting: Family Medicine

## 2018-01-12 VITALS — BP 122/76 | HR 73 | Temp 97.9°F | Ht 61.0 in | Wt 130.0 lb

## 2018-01-12 DIAGNOSIS — R059 Cough, unspecified: Secondary | ICD-10-CM

## 2018-01-12 DIAGNOSIS — R05 Cough: Secondary | ICD-10-CM

## 2018-01-12 MED ORDER — ALBUTEROL SULFATE HFA 108 (90 BASE) MCG/ACT IN AERS: 1.0000 | INHALATION_SPRAY | Freq: Three times a day (TID) | RESPIRATORY_TRACT | 0 refills | Status: DC | PRN

## 2018-01-12 MED ORDER — ALBUTEROL SULFATE (2.5 MG/3ML) 0.083% IN NEBU
2.5000 mg | INHALATION_SOLUTION | Freq: Once | RESPIRATORY_TRACT | Status: AC
  Administered 2018-01-12: 2.5 mg via RESPIRATORY_TRACT

## 2018-01-12 MED ORDER — DOXYCYCLINE HYCLATE 100 MG PO TABS: 100.0000 mg | ORAL_TABLET | Freq: Two times a day (BID) | ORAL | 0 refills | Status: DC

## 2018-01-12 NOTE — Patient Instructions (Signed)
Start doxycycline.  It should be okay to use albuterol as needed for the cough.  Use it only as needed.  Update me tomorrow.  Take care.  Glad to see you.

## 2018-01-12 NOTE — Progress Notes (Signed)
Sx going on since 01/07/18.  2 days ago was the worst day.  Cough, rhinorrhea.  Sugar has been controlled, ~100 in the AMs.  Some sputum that could be from post nasal gtt.  Sputum is yellow/brown, thick and sticky.  Rhinorrhea has been yellow today.  She has some wheeze.    Using CPAP and O2 at baseline.    Meds, vitals, and allergies reviewed.   ROS: Per HPI unless specifically indicated in ROS section   Recheck 93%RA.   GEN: nad, alert and oriented HEENT: mucous membranes moist, nasal exam w/o erythema, clear discharge noted,  OP with cobblestoning NECK: supple w/o LA CV: rrr.   PULM: exp diffuse wheeze and rhonchi, no inc wob, no focal dec in BS EXT: no edema SKIN: no acute rash  She was able to tolerate SABA neb here inc clinic.  She didn't have tachycardia and she had sig sputum production afterward.  Recheck chest exam showed increased air movement, less wheeze and rhonchi in all lung fields.  Heart was still regular and not tacky.  She looked better clinically after the nebulizer treatment.

## 2018-01-13 ENCOUNTER — Telehealth: Payer: Self-pay | Admitting: Family Medicine

## 2018-01-13 DIAGNOSIS — R059 Cough, unspecified: Secondary | ICD-10-CM | POA: Insufficient documentation

## 2018-01-13 DIAGNOSIS — R05 Cough: Secondary | ICD-10-CM | POA: Insufficient documentation

## 2018-01-13 NOTE — Telephone Encounter (Signed)
Pt was seen on 01/12/18 and was to cb with update.

## 2018-01-13 NOTE — Telephone Encounter (Signed)
Copied from [place] [id number]. Topic: Quick Communication - See Telephone Encounter >> Jan 13, 2018  5:07 PM Ivar Drape wrote: CRM for notification. See Telephone encounter for: 01/13/18. Patient has less of a cough.  Breathing is easier.  Feeling better over all.  No problems with the medications.

## 2018-01-13 NOTE — Assessment & Plan Note (Signed)
Presumed bronchitis.  Discussed with patient about options.  She is at relatively elevated risk for progression of disease given her other comorbid conditions.  Discussed.  At this point she looks like she is still okay for outpatient follow-up.  Routine cautions given.  Start doxycycline.  Prescription given to patient to use albuterol as needed at home.  We talked about potential Qt issues.  She was able to tolerate the medication here in the clinic without complication and with apparent benefit.  See above.  We talked about not using Symbicort previously.  Given the shorter half-life of albuterol and the fact that she can use it only as an add needed medication, in the short-term, she should be able to tolerate this.  This is likely a better option than prednisone given that would likely greatly affect her sugars.  She agreed with all of this.  Routine emergency cautions given to patient.  She will update me about how she is feeling. >25 minutes spent in face to face time with patient, >50% spent in counselling or coordination of care.

## 2018-01-14 NOTE — Telephone Encounter (Signed)
Noted.  Great.  Thanks. Continue as is and update me as needed.

## 2018-01-19 ENCOUNTER — Telehealth: Payer: Self-pay | Admitting: Family Medicine

## 2018-01-19 MED ORDER — NYSTATIN 100000 UNIT/ML MT SUSP: 5.0000 mL | Freq: Four times a day (QID) | OROMUCOSAL | 0 refills | Status: DC

## 2018-01-19 NOTE — Telephone Encounter (Signed)
Copied from [place]. Topic: Quick Communication - See Telephone Encounter >> Jan 19, 2018  9:53 AM Aurelio Brash B wrote: CRM for notification. See Telephone encounter for: 01/19/18.  PT wanted to let Dr Damita Dunnings know that since starting doxycycline (VIBRA-TABS) 100 MG tablet she has developed  oral thrush- white spots  Roof  And back of mouth and tongue , feels rough

## 2018-01-19 NOTE — Telephone Encounter (Signed)
Patient notified as instructed by telephone and verbalized understanding. 

## 2018-01-19 NOTE — Telephone Encounter (Signed)
Skip the doxy for 24 hours, then restart.  Start nystatin in the meantime.  rx sent.   Update me if not better.  Thanks.

## 2018-01-21 ENCOUNTER — Ambulatory Visit (INDEPENDENT_AMBULATORY_CARE_PROVIDER_SITE_OTHER): Payer: Medicare Other | Admitting: Cardiovascular Disease

## 2018-01-21 ENCOUNTER — Encounter: Payer: Self-pay | Admitting: Cardiovascular Disease

## 2018-01-21 VITALS — BP 131/69 | HR 75 | Ht 62.0 in | Wt 127.6 lb

## 2018-01-21 DIAGNOSIS — I5042 Chronic combined systolic (congestive) and diastolic (congestive) heart failure: Secondary | ICD-10-CM

## 2018-01-21 DIAGNOSIS — E7800 Pure hypercholesterolemia, unspecified: Secondary | ICD-10-CM | POA: Diagnosis not present

## 2018-01-21 DIAGNOSIS — E1159 Type 2 diabetes mellitus with other circulatory complications: Secondary | ICD-10-CM

## 2018-01-21 DIAGNOSIS — I480 Paroxysmal atrial fibrillation: Secondary | ICD-10-CM

## 2018-01-21 DIAGNOSIS — I25708 Atherosclerosis of coronary artery bypass graft(s), unspecified, with other forms of angina pectoris: Secondary | ICD-10-CM

## 2018-01-21 DIAGNOSIS — I739 Peripheral vascular disease, unspecified: Secondary | ICD-10-CM | POA: Diagnosis not present

## 2018-01-21 DIAGNOSIS — I10 Essential (primary) hypertension: Secondary | ICD-10-CM | POA: Diagnosis not present

## 2018-01-21 NOTE — Progress Notes (Signed)
Patient ID: [name], female   DOB: Aug 07, 1935, 83 y.o.   MRN: [id number]    Cardiology Office Note    Date:  01/21/2018   ID:  [name], DOB [birth year], MRN [id number]  PCP:  April Ghent, MD  Cardiologist:   April Klein, MD   chief complaint: Follow-up coronary disease and atrial fibrillation, chronic sotalol therapy   History of Present Illness:  [name] is a 83 y.o. female who presents for Follow-up for coronary artery disease status post previous bypass surgery with exertional angina , paroxysmal atrial fibrillation on sotalol therapy , not on anticoagulation due to history of spontaneous retroperitoneal hematoma, treated hypertension, hyperlipidemia and diabetes mellitus complicated by PAD and chronic kidney disease stage II.  Is doing well from a cardiac standpoint, but has had a upper respiratory infection.  She has a lot of sinus drainage but also wheezing and productive cough.  She was given doxycycline, after which she developed thrush and she is now taking nystatin.  She feels more dependent on her oxygen now.  She has not had any new falls or injuries.  She denies syncope or palpitations.  She has not had any bleeding problems.  She denies angina, orthopnea, PND or leg edema.  While on treatment with low-dose sotalol and Ranexa she has not had recurrence of atrial fibrillation, at least not any better pulse symptoms.  Her QTC today is 431 ms  [name] is a 83 year old woman with a history of coronary disease, now 12 years status post post bypass surgery. (interval total occlusion 3 saphenous vein grafts, patent LIMA to LAD, total occlusion of the major OM artery and the right coronary artery with some collateral filling). Ranexa has had a substantial impact with virtual abolition of exertional angina pectoris.  She has a history of paroxysmal atrial fibrillation. While on treatment with aspirin and Plavix she developed a spontaneous retroperitoneal hematoma.  She is therefore not taking anticoagulants. Ranexa may also be helping with a reduction in the burden of atrial fibrillation, without signs of QT interval prolongation. She has a moderate to severely dilated left atrium. Most recent EF assessment was >60% by scintigraphy and LV angiography in 2012, NYHA class II symptoms of congestive heart failure. She has a mild to moderate degree of mitral insufficiency.   Also has type 2 diabetes mellitus, mixed hyperlipidemia and systemic hypertension. She has had several serious falls and injuries due to poor balance, not syncope. She still lives alone  Past Medical History:  Diagnosis Date  . Anemia, iron deficiency   . Atrial fibrillation ([place]) 11/28/2010   2D Echo - EF-30-35, left atrium moderate to severely dilated, right ventricle moderately dilated, tricuspid valve mild-moderate regurgitation  . Cancer [facility])    R beast cancer followed by Dr. Hassell Done  . CAP (community acquired pneumonia)   . Chronic kidney disease (CKD), stage II (mild)   . Coronary artery disease   . Diabetes mellitus, type 2 ([place])   . Diverticulitis   . History of breast cancer    Followed yearly by [facility]  . Hyperlipemia   . Hypertension   . Nontraumatic retroperitoneal hematoma   . Osteoporosis   . Peripheral vascular disease [facility])     Past Surgical History:  Procedure Laterality Date  . APPENDECTOMY  1962  . BREAST SURGERY  02/01   Quadrectomy Breast cancer (Dr. Hassell Done)  . CARDIAC CATHETERIZATION  2000   Iliac Stent (Dr. Juanita Craver) yearly follow-up  . CARDIAC  CATHETERIZATION  01/25/02   High grade LAD disease, total of 3 vess, involvement  . CARDIAC CATHETERIZATION  01/13/07   SV graft to diag. occluded but good backfill o/w ok  . CARDIAC CATHETERIZATION  02/14/10   PTCA & stent vein graft to OM/RCA ([place])  . CARDIAC CATHETERIZATION  04/17/2011   Saphenous vein grafts to the RCA 100% occluded, to the OM 100% occluded, and the diagonal 100% occluded. Internal mammary  artery to the LAD, widely patent.  Marland Kitchen CATARACT EXTRACTION  10/10 and 02/11  . COLOSTOMY    . CORONARY ARTERY BYPASS GRAFT  02/09/02   X 4 ([place])  . DILATION AND CURETTAGE OF UTERUS  1971    miscarrage, abnormal bleeding  . DILATION AND CURETTAGE OF UTERUS  1995   Menorrhagia  . HEMORRHOID SURGERY  09/11/04   Binding Hassell Done)  . PARTIAL COLECTOMY    . SHOULDER ARTHROSCOPY  05/04   Right, with rotator cuff debridement    Outpatient Medications Prior to Visit  Medication Sig Dispense Refill  . albuterol (PROVENTIL HFA;VENTOLIN HFA) 108 (90 Base) MCG/ACT inhaler Inhale 1-2 puffs into the lungs 3 (three) times daily as needed. 1 Inhaler 0  . amLODipine (NORVASC) 10 MG tablet Take one (1) tablet (10 mg) by mouth daily. 90 tablet 3  . aspirin EC 81 MG tablet Take 81 mg by mouth daily.    Marland Kitchen atorvastatin (LIPITOR) 20 MG tablet TAKE 1 TABLET BY MOUTH AT BEDTIME 90 tablet 3  . BD INSULIN SYRINGE ULTRAFINE 31G X 5/16" 0.3 ML MISC USE DAILY AND AS DIRECTED SLIDING SCALE. MULTIPLE INJECTIONS OF INSULIN PER DAY 200 each 3  . benazepril (LOTENSIN) 20 MG tablet Take 1 tablet (20 mg total) by mouth daily. 90 tablet 1  . Cholecalciferol (VITAMIN D) 1000 UNITS capsule Take 1,000 Units by mouth 2 (two) times daily.      Marland Kitchen doxycycline (VIBRA-TABS) 100 MG tablet Take 1 tablet (100 mg total) by mouth 2 (two) times daily. 20 tablet 0  . FLOVENT HFA 44 MCG/ACT inhaler TAKE 2 PUFFS BY MOUTH TWICE A DAY 10.6 Inhaler 5  . glyBURIDE (DIABETA) 5 MG tablet TAKE 2 TABLETS (10 MG TOTAL) BY MOUTH DAILY WITH BREAKFAST. 180 tablet 1  . insulin aspart (NOVOLOG) 100 UNIT/ML injection USE PER SLIDING SCALE--200-250=4-5 UNITS, 251-300=5-6 UNITS,301-350=6-7 UNITS, WITH MEALS 30 mL 3  . meclizine (ANTIVERT) 25 MG tablet Take 1 tablet (25 mg total) by mouth daily as needed. For dizziness 30 tablet 6  . nitroGLYCERIN (NITROSTAT) 0.4 MG SL tablet Place 1 tablet (0.4 mg total) under the tongue as needed for chest pain. As  directed 25 tablet 11  . NON FORMULARY Oxygen 2 liters 24/7    . nystatin (MYCOSTATIN) 100000 UNIT/ML suspension Take 5 mLs (500,000 Units total) by mouth 4 (four) times daily. Swish and swallow 120 mL 0  . nystatin (MYCOSTATIN/NYSTOP) 100000 UNIT/GM POWD Apply up to 3 times a day if needed. 15 g 5  . ONE TOUCH ULTRA TEST test strip USE TO CHECK BLOOD SUGAR 4 TIMES A DAY. INSULIN-DEPENDENT. DIAGNOSIS: E11.9 100 each 5  . Polyvinyl Alcohol-Povidone (REFRESH OP) Place 1 drop into both eyes daily as needed.    . ranolazine (RANEXA) 500 MG 12 hr tablet TAKE 1 TABLET (500 MG TOTAL) BY MOUTH 2 (TWO) TIMES DAILY. 180 tablet 1  . sotalol (BETAPACE) 80 MG tablet Take 0.5 tablets (40 mg total) by mouth 2 (two) times daily. 90 tablet 3   No facility-administered  medications prior to visit.      Allergies:   Actos [pioglitazone]; Metformin and related; and Zocor [simvastatin]   Social History   Socioeconomic History  . Marital status: Widowed    Spouse name: Not on file  . Number of children: 2  . Years of education: Not on file  . Highest education level: Not on file  Occupational History  . Occupation: Retired  Scientific laboratory technician  . Financial resource strain: Not on file  . Food insecurity:    Worry: Not on file    Inability: Not on file  . Transportation needs:    Medical: Not on file    Non-medical: Not on file  Tobacco Use  . Smoking status: Never Smoker  . Smokeless tobacco: Never Used  Substance and Sexual Activity  . Alcohol use: No    Alcohol/week: 0.0 oz  . Drug use: No  . Sexual activity: Never  Lifestyle  . Physical activity:    Days per week: Not on file    Minutes per session: Not on file  . Stress: Not on file  Relationships  . Social connections:    Talks on phone: Not on file    Gets together: Not on file    Attends religious service: Not on file    Active member of club or organization: Not on file    Attends meetings of clubs or organizations: Not on file     Relationship status: Not on file  Other Topics Concern  . Not on file  Social History Narrative   Retired 1991 from [employer] work   Widowed as of 04/2011 after 50+ years   Lives alone   2 kids in [place]   Enjoys travelling and reading     Family History:  The patient's family history includes Breast cancer in her maternal aunt; COPD in her brother; Diabetes in her mother and son; Gout in her brother; Heart disease in her brother, brother, and mother; Heart disease (age of onset: 22) in her father; Hyperlipidemia in her brother; Hypertension in her brother, brother, brother, father, mother, and son; Obesity in her brother.   ROS:   Please see the history of present illness.    ROS All other systems reviewed and are negative.   PHYSICAL EXAM:   VS:  BP 131/69   Pulse 75   Ht 5\' 2"  (1.575 m)   Wt 127 lb 9.6 oz (57.9 kg)   BMI 23.34 kg/m      General: Alert, oriented x3, no distress, appears elderly and a little frail Head: no evidence of trauma, PERRL, EOMI, no exophtalmos or lid lag, no myxedema, no xanthelasma; normal ears, nose and oropharynx Neck: normal jugular venous pulsations and no hepatojugular reflux; brisk carotid pulses without delay and no carotid bruits Chest: She has a few weak wheezes and rhonchi in the right base. Cardiovascular: normal position and quality of the apical impulse, regular rhythm, normal first and second heart sounds, grade 1/6 apical holosystolic murmur radiating toward the axilla, no diastolic murmurs, rubs or gallops.  Abdomen: no tenderness or distention, no masses by palpation, no abnormal pulsatility or arterial bruits, normal bowel sounds, no hepatosplenomegaly Extremities: no clubbing, cyanosis or edema; 2+ radial, ulnar and brachial pulses bilaterally; 2+ right femoral, posterior tibial and dorsalis pedis pulses; 2+ left femoral, posterior tibial and dorsalis pedis pulses; no subclavian or femoral bruits Neurological: grossly  nonfocal Psych: Normal mood and affect    Wt Readings from Last 3 Encounters:  01/21/18 127 lb 9.6 oz (57.9 kg)  01/12/18 130 lb (59 kg)  12/28/17 131 lb (59.4 kg)      Studies/Labs Reviewed:   EKG:  EKG is ordered today.  The ekg ordered today  sinus rhythm, QTC 431 ms Recent Labs: 05/18/2017: ALT 8 12/21/2017: BUN 23; Creatinine, Ser 1.08; Hemoglobin 11.7; Platelets 242.0; Potassium 4.9; Sodium 139   Lipid Panel    Component Value Date/Time   CHOL 154 07/12/2017 0932   TRIG 81 07/12/2017 0932   HDL 47 07/12/2017 0932   CHOLHDL 3.3 07/12/2017 0932   CHOLHDL 3 12/09/2015 0853   VLDL 20.4 12/09/2015 0853   LDLCALC 91 07/12/2017 0932   LDLDIRECT 87.3 11/21/2008 1446    ASSESSMENT:    1. Chronic combined systolic and diastolic congestive heart failure ([place])   2. Coronary artery disease of bypass graft of native heart with stable angina pectoris (HCC)   3. Paroxysmal atrial fibrillation ([place])   4. Pure hypercholesterolemia   5. Type 2 diabetes mellitus with vascular disease ([place])   6. Peripheral vascular disease ([place])   7. Essential hypertension      PLAN:  In order of problems listed above:  1. CHF: Continues to have problems with shortness of breath, currently worsened by respiratory infection.  No physical findings to suggest hypervolemia.  I do not think we need to adjust her diuretics. 2. CAD: Her angina has resolved ever since he started on Ranexa. Also on high-dose amlodipine   And possibly benefiting from the beta-blocker effects of sotalol. She is essentially dependent on the LIMA to LAD bypass graft, with occlusion of all 3 vein grafts as well as the native oblique marginal and native right coronary artery. There were no good options for either percutaneous or surgical revascularization when she last had a heart catheterization in 2012. 3. AFib: Combination of sotalol and ranolazine has been excellent to control her arrhythmia and she has not had any problems with  excessive QT prolongation.  Unable to take anticoagulation due to history of severe retroperitoneal bleeding when on aspirin/clopidogrel combination. She has also had at least one serious fall recently. CHADSVasc 7 (age 32, gender, diabetes, hypertension, heart failure, vascular disease).  Fortunately she has not had any embolic events. 4. HLP: All her lipid parameters are in the desirable range close to it.  She stopped is a year since she was unable to afford it. 5. DM: She reports that her insulin doses are very low and that her blood glucose is well controlled. 6. PAD: Denies claudication, although this could be due to the fact that she is very sedentary.   7. HTN: Well-controlled  Medication Adjustments/Labs and Tests Ordered: Current medicines are reviewed at length with the patient today.  Concerns regarding medicines are outlined above.  Medication changes, Labs and Tests ordered today are listed in the Patient Instructions below. Patient Instructions  Dr Sallyanne Kuster recommends that you schedule a follow-up appointment in 6 months. You will receive a reminder letter in the mail two months in advance. If you don't receive a letter, please call our office to schedule the follow-up appointment.  If you need a refill on your cardiac medications before your next appointment, please call your pharmacy.      Signed, April Klein, MD  01/21/2018 7:41 PM    [address], [place], [place]  [address] Phone: [phone]; Fax: [phone]

## 2018-01-21 NOTE — Patient Instructions (Signed)
Dr Croitoru recommends that you schedule a follow-up appointment in 6 months. You will receive a reminder letter in the mail two months in advance. If you don't receive a letter, please call our office to schedule the follow-up appointment.  If you need a refill on your cardiac medications before your next appointment, please call your pharmacy. 

## 2018-02-07 ENCOUNTER — Telehealth: Payer: Self-pay | Admitting: Family Medicine

## 2018-02-07 NOTE — Telephone Encounter (Signed)
Copied from [place] [phone]. Topic: Quick Communication - See Telephone Encounter >> Feb 07, 2018 10:19 AM Marja Kays F wrote: Pt states that she just finished the medication for thrush and it has come back does she need a refill of the medication of nystatin [facility] [phone] or does she need to have an appointment scheduled  Best number [phone]

## 2018-02-08 MED ORDER — NYSTATIN 100000 UNIT/ML MT SUSP: 5.0000 mL | Freq: Four times a day (QID) | OROMUCOSAL | 0 refills | Status: DC

## 2018-02-08 NOTE — Telephone Encounter (Signed)
Restart and if not better with this then needs f/u.  Send.  Thanks.

## 2018-02-08 NOTE — Telephone Encounter (Addendum)
I got to this as quickly as I could.   Check the timestamps on this.  I think I only got this today.

## 2018-02-08 NOTE — Telephone Encounter (Signed)
Not a problem, just a statement that she made.

## 2018-02-08 NOTE — Telephone Encounter (Signed)
Patient advised and states she was expecting to hear from this phone call yesterday since she called in yesterday morning and they said it would be discussed and they would call back.

## 2018-02-28 ENCOUNTER — Other Ambulatory Visit: Payer: Self-pay | Admitting: Cardiovascular Disease

## 2018-03-04 ENCOUNTER — Other Ambulatory Visit: Payer: Self-pay | Admitting: Cardiovascular Disease

## 2018-03-04 NOTE — Telephone Encounter (Signed)
Rx sent to pharmacy   

## 2018-03-06 ENCOUNTER — Other Ambulatory Visit: Payer: Self-pay | Admitting: Family Medicine

## 2018-03-06 DIAGNOSIS — N2889 Other specified disorders of kidney and ureter: Secondary | ICD-10-CM

## 2018-03-06 NOTE — Progress Notes (Signed)
Due for f/u MRI kidney.  I put in the order.  Thanks.   Patient advised.  03/07/18  L. [place], CMA

## 2018-03-07 ENCOUNTER — Other Ambulatory Visit: Payer: Self-pay | Admitting: Family Medicine

## 2018-03-07 DIAGNOSIS — N183 Chronic kidney disease, stage 3 unspecified: Secondary | ICD-10-CM

## 2018-04-04 ENCOUNTER — Other Ambulatory Visit (INDEPENDENT_AMBULATORY_CARE_PROVIDER_SITE_OTHER): Payer: Medicare Other

## 2018-04-04 DIAGNOSIS — N183 Chronic kidney disease, stage 3 unspecified: Secondary | ICD-10-CM

## 2018-04-04 DIAGNOSIS — E1159 Type 2 diabetes mellitus with other circulatory complications: Secondary | ICD-10-CM | POA: Diagnosis not present

## 2018-04-04 DIAGNOSIS — R7989 Other specified abnormal findings of blood chemistry: Secondary | ICD-10-CM | POA: Diagnosis not present

## 2018-04-04 LAB — BASIC METABOLIC PANEL
BUN: 22 mg/dL (ref 6–23)
CALCIUM: 9.6 mg/dL (ref 8.4–10.5)
CO2: 32 meq/L (ref 19–32)
Chloride: 102 mEq/L (ref 96–112)
Creatinine, Ser: 1.1 mg/dL (ref 0.40–1.20)
GFR: 50.7 mL/min — ABNORMAL LOW (ref >60.00)
Glucose, Bld: 121 mg/dL — ABNORMAL HIGH (ref 70–99)
Potassium: 5.2 mEq/L — ABNORMAL HIGH (ref 3.5–5.1)
SODIUM: 139 meq/L (ref 135–145)

## 2018-04-04 LAB — CBC WITH DIFFERENTIAL/PLATELET
BASOS PCT: 0.5 % (ref 0.0–3.0)
Basophils Absolute: 0 10*3/uL (ref 0.0–0.1)
Eosinophils Absolute: 0.1 10*3/uL (ref 0.0–0.7)
Eosinophils Relative: 1.1 % (ref 0.0–5.0)
HEMATOCRIT: 33.7 % — AB (ref 36.0–46.0)
HEMOGLOBIN: 11.1 g/dL — AB (ref 12.0–15.0)
LYMPHS PCT: 18.9 % (ref 12.0–46.0)
Lymphs Abs: 1.7 10*3/uL (ref 0.7–4.0)
MCHC: 32.9 g/dL (ref 30.0–36.0)
MCV: 90.9 fl (ref 78.0–100.0)
Monocytes Absolute: 2.5 10*3/uL — ABNORMAL HIGH (ref 0.1–1.0)
Neutro Abs: 4.6 10*3/uL (ref 1.4–7.7)
Neutrophils Relative %: 51.9 % (ref 43.0–77.0)
Platelets: 247 10*3/uL (ref 150.0–400.0)
RBC: 3.7 Mil/uL — AB (ref 3.87–5.11)
RDW: 15.6 % — AB (ref 11.5–15.5)
WBC: 9 10*3/uL (ref 4.0–10.5)

## 2018-04-04 LAB — HEMOGLOBIN A1C: HEMOGLOBIN A1C: 8.1 % — AB (ref 4.6–6.5)

## 2018-04-05 ENCOUNTER — Ambulatory Visit (HOSPITAL_COMMUNITY)
Admission: RE | Admit: 2018-04-05 | Discharge: 2018-04-05 | Disposition: A | Discharge status: Home / Self Care | Payer: Medicare Other | Source: Ambulatory Visit | Attending: Family Medicine | Admitting: Family Medicine

## 2018-04-05 DIAGNOSIS — N2889 Other specified disorders of kidney and ureter: Secondary | ICD-10-CM | POA: Diagnosis not present

## 2018-04-05 DIAGNOSIS — K8020 Calculus of gallbladder without cholecystitis without obstruction: Secondary | ICD-10-CM | POA: Diagnosis not present

## 2018-04-05 DIAGNOSIS — K862 Cyst of pancreas: Secondary | ICD-10-CM | POA: Diagnosis not present

## 2018-04-05 MED ORDER — GADOBENATE DIMEGLUMINE 529 MG/ML IV SOLN
15.0000 mL | Freq: Once | INTRAVENOUS | Status: AC
  Administered 2018-04-05: 15 mL via INTRAVENOUS

## 2018-04-11 ENCOUNTER — Encounter: Payer: Self-pay | Admitting: Family Medicine

## 2018-04-11 ENCOUNTER — Ambulatory Visit (INDEPENDENT_AMBULATORY_CARE_PROVIDER_SITE_OTHER): Payer: Medicare Other | Admitting: Family Medicine

## 2018-04-11 DIAGNOSIS — R7989 Other specified abnormal findings of blood chemistry: Secondary | ICD-10-CM | POA: Diagnosis not present

## 2018-04-11 DIAGNOSIS — E1159 Type 2 diabetes mellitus with other circulatory complications: Secondary | ICD-10-CM

## 2018-04-11 DIAGNOSIS — Y92009 Unspecified place in unspecified non-institutional (private) residence as the place of occurrence of the external cause: Secondary | ICD-10-CM | POA: Diagnosis not present

## 2018-04-11 DIAGNOSIS — R0902 Hypoxemia: Secondary | ICD-10-CM | POA: Diagnosis not present

## 2018-04-11 DIAGNOSIS — I25708 Atherosclerosis of coronary artery bypass graft(s), unspecified, with other forms of angina pectoris: Secondary | ICD-10-CM

## 2018-04-11 DIAGNOSIS — W19XXXA Unspecified fall, initial encounter: Secondary | ICD-10-CM | POA: Diagnosis not present

## 2018-04-11 DIAGNOSIS — I480 Paroxysmal atrial fibrillation: Secondary | ICD-10-CM

## 2018-04-11 NOTE — Patient Instructions (Addendum)
Restart your balance exercises.   Call about an eye exam when you can.  Take care.  Glad to see you.  Recheck in about 4 months with a yearly visit with [place] and Damita Dunnings on the same day.

## 2018-04-11 NOTE — Progress Notes (Signed)
She is using O2 intermittently, with walking and that keeps her O2 sats in the upper 90s.  She can take it off at rest and maintain her sats.    Diabetes:  Using medications without difficulties:yes Hypoglycemic episodes:rare early AM sx, down to the 70s Hyperglycemic episodes:no Feet problems: no Blood Sugars averaging: usually ~100.  Later in the day usually ~200s eye exam within last year: due, she'll call.  D/w pt.  A1c reasonable at ~8.   D/w pt. I do not want to induce hypoglycemia.  She fell 03/01/18 moving from the yard to the patio.  She fell and bruised L side of face.  She has a fall button.  Cautions d/w pt.  Prev bruising is mostly resolved now.  She was able to get up on her own.  She talked to her kids about it.  She declined PT for now.  She has been doing balance exercises in the past but not recently, d/w pt about restarting.  D/w pt about using her cane as needed.    H/o abnormal CBC with prev labs d/w pt.  CBC stable.  Bruising from fall but not bleeding o/w.  Previous peripheral smear as expected and no ominous findings. D/w pt.   She had trouble with thrush and listerine helped.  She has used nystatin as needed.  She has occ irritation on the uvula.  It was clearly worse prev.    She had an episode of speech change in 01/2018, since last OV.  Lasted about 5 minutes.  Self resolved.  Witnessed. No weakness at the time.  No LOC.  No facial weakness. She was annoyed with it.  No blood thinners other than ASA currently.  Fall risk noted.  The episode happened right before she was getting ready to eat a meal, unclear if it was related to hypoglycemia.  PMH and SH reviewed  Meds, vitals, and allergies reviewed.   ROS: Per HPI unless specifically indicated in ROS section   GEN: nad, alert and oriented HEENT: mucous membranes moist NECK: supple w/o LA CV: rrr. PULM: ctab, no inc wob ABD: soft, +bs EXT: no edema on R ankle, trace on L SKIN: no acute rash but resolving  bruising noted on left side of face.  Diabetic foot exam: Normal inspection No skin breakdown No calluses  Faint DP pulses B Normal sensation to light touch and monofilament Nails normal except for R1st nail thick

## 2018-04-12 NOTE — Assessment & Plan Note (Signed)
H/o abnormal CBC with prev labs d/w pt.  CBC stable.  Bruising from fall but not bleeding o/w.  Previous peripheral smear as expected and no ominous findings. D/w pt. we can recheck periodically.

## 2018-04-12 NOTE — Progress Notes (Signed)
Thank you for the follow up. Unfortunately, I agree that she is not a good candidate for anticoagulation. She has had life threatening bleeding problems in the past and she is very unsteady on her feet. Newton Frutiger

## 2018-04-12 NOTE — Assessment & Plan Note (Signed)
A1c reasonable at ~8.   D/w pt. I do not want to induce hypoglycemia.  No change in medication at this point.  Continue work on diet and exercise as tolerated.  Recheck periodically.  She agrees.  Unclear how much diabetes contributes to her previous episode with thrush.  No symptoms now on exam.

## 2018-04-12 NOTE — Assessment & Plan Note (Signed)
She had an episode of speech change in 01/2018, since last OV.  Lasted about 5 minutes.  Self resolved.  Witnessed. No weakness at the time.  No LOC.  No facial weakness. She was annoyed with it.  No blood thinners other than ASA currently.  Fall risk noted.  The episode happened right before she was getting ready to eat a meal, unclear if it was related to hypoglycemia.  It is unclear to me if this was a TIA.  Even if it was a TIA, is still might make sense not to anticoagulate her given her fall risk.  Because of all this, it may actually not change her plan.  Discussed with patient.  No residual symptoms.  I will notify cardiology for input. Patient agrees.  >25 minutes spent in face to face time with patient, >50% spent in counselling or coordination of care.

## 2018-04-12 NOTE — Assessment & Plan Note (Signed)
She declined physical therapy.  Discussed with patient about restarting balance exercises at home.  Routine cautions given.

## 2018-04-12 NOTE — Assessment & Plan Note (Addendum)
Continue as is with prn O2 use, discussed with patient.  She agrees.

## 2018-04-13 ENCOUNTER — Telehealth: Payer: Self-pay | Admitting: Family Medicine

## 2018-04-13 NOTE — Telephone Encounter (Signed)
Patient notified as instructed by telephone and verbalized understanding. 

## 2018-04-13 NOTE — Telephone Encounter (Signed)
Notify pt.  I checked with cardiology.  If the event she described in the restaurant a few months ago really was a TIA, it wouldn't change mgmt.  Cards agrees that she is not a good candidate for anticoagulation.  She would still continue her baseline meds, as is.  Continue as planned.  Thanks.

## 2018-04-18 ENCOUNTER — Other Ambulatory Visit: Payer: Self-pay | Admitting: Adult Health

## 2018-04-18 ENCOUNTER — Telehealth: Payer: Self-pay | Admitting: Pulmonary Disease

## 2018-04-18 MED ORDER — FLUTICASONE PROPIONATE HFA 44 MCG/ACT IN AERO: 2.0000 | INHALATION_SPRAY | Freq: Two times a day (BID) | RESPIRATORY_TRACT | 5 refills | Status: DC

## 2018-04-18 NOTE — Telephone Encounter (Signed)
Refill of pt's flovent inhaler sent to pt's preferred pharmacy.  Nothing further needed.

## 2018-04-19 DIAGNOSIS — Z961 Presence of intraocular lens: Secondary | ICD-10-CM | POA: Diagnosis not present

## 2018-04-19 LAB — HM DIABETES EYE EXAM

## 2018-04-29 ENCOUNTER — Encounter: Payer: Self-pay | Admitting: Family Medicine

## 2018-06-08 ENCOUNTER — Other Ambulatory Visit: Payer: Self-pay | Admitting: Family Medicine

## 2018-06-15 ENCOUNTER — Other Ambulatory Visit: Payer: Self-pay | Admitting: Cardiovascular Disease

## 2018-06-15 NOTE — Telephone Encounter (Signed)
Rx request sent to pharmacy.  

## 2018-06-16 ENCOUNTER — Other Ambulatory Visit: Payer: Self-pay | Admitting: *Deleted

## 2018-06-16 MED ORDER — GLUCOSE BLOOD VI STRP: ORAL_STRIP | 5 refills | Status: DC

## 2018-06-24 ENCOUNTER — Other Ambulatory Visit: Payer: Self-pay | Admitting: Family Medicine

## 2018-06-30 ENCOUNTER — Other Ambulatory Visit: Payer: Self-pay | Admitting: Family Medicine

## 2018-07-06 ENCOUNTER — Other Ambulatory Visit: Payer: Self-pay | Admitting: Cardiovascular Disease

## 2018-07-28 ENCOUNTER — Ambulatory Visit (INDEPENDENT_AMBULATORY_CARE_PROVIDER_SITE_OTHER): Payer: Medicare Other

## 2018-07-28 DIAGNOSIS — Z23 Encounter for immunization: Secondary | ICD-10-CM | POA: Diagnosis not present

## 2018-08-09 ENCOUNTER — Ambulatory Visit (INDEPENDENT_AMBULATORY_CARE_PROVIDER_SITE_OTHER): Payer: Medicare Other | Admitting: Cardiovascular Disease

## 2018-08-09 ENCOUNTER — Encounter: Payer: Self-pay | Admitting: Cardiovascular Disease

## 2018-08-09 VITALS — BP 138/64 | HR 67 | Ht 62.0 in | Wt 130.0 lb

## 2018-08-09 DIAGNOSIS — I25708 Atherosclerosis of coronary artery bypass graft(s), unspecified, with other forms of angina pectoris: Secondary | ICD-10-CM

## 2018-08-09 DIAGNOSIS — I25718 Atherosclerosis of autologous vein coronary artery bypass graft(s) with other forms of angina pectoris: Secondary | ICD-10-CM | POA: Diagnosis not present

## 2018-08-09 DIAGNOSIS — I480 Paroxysmal atrial fibrillation: Secondary | ICD-10-CM

## 2018-08-09 DIAGNOSIS — I739 Peripheral vascular disease, unspecified: Secondary | ICD-10-CM

## 2018-08-09 DIAGNOSIS — E1151 Type 2 diabetes mellitus with diabetic peripheral angiopathy without gangrene: Secondary | ICD-10-CM

## 2018-08-09 DIAGNOSIS — I5032 Chronic diastolic (congestive) heart failure: Secondary | ICD-10-CM

## 2018-08-09 DIAGNOSIS — I10 Essential (primary) hypertension: Secondary | ICD-10-CM | POA: Diagnosis not present

## 2018-08-09 DIAGNOSIS — E7800 Pure hypercholesterolemia, unspecified: Secondary | ICD-10-CM | POA: Diagnosis not present

## 2018-08-09 NOTE — Patient Instructions (Signed)
Medication Instructions:  Dr Croitoru recommends that you continue on your current medications as directed. Please refer to the Current Medication list given to you today.  If you need a refill on your cardiac medications before your next appointment, please call your pharmacy.   Follow-Up: At [facility], you and your health needs are our priority.  As part of our continuing mission to provide you with exceptional heart care, we have created designated Provider Care Teams.  These Care Teams include your primary Cardiologist (physician) and Advanced Practice Providers (APPs -  Physician Assistants and Nurse Practitioners) who all work together to provide you with the care you need, when you need it. You will need a follow up appointment in 6 months.  Please call our office 2 months in advance to schedule this appointment.  You may see Mihai Croitoru, MD or one of the following Advanced Practice Providers on your designated Care Team: Hao Meng, PA-C .  [facility], PA-C 

## 2018-08-09 NOTE — Progress Notes (Signed)
Patient ID: [name], female   DOB: 19-Mar-1935, 83 y.o.   MRN: [id number]    Cardiology Office Note    Date:  08/14/2018   ID:  [name], DOB 1934/12/16, MRN [id number]  PCP:  Tonia Ghent, MD  Cardiologist:   Sanda Klein, MD   chief complaint: Follow-up coronary disease and atrial fibrillation, chronic sotalol therapy   History of Present Illness:  [name] is a 83 y.o. female who presents for Follow-up for coronary artery disease status post previous bypass surgery with exertional angina , paroxysmal atrial fibrillation on sotalol therapy , not on anticoagulation due to history of spontaneous retroperitoneal hematoma, treated hypertension, hyperlipidemia and diabetes mellitus complicated by PAD and chronic kidney disease stage II.  Overall she is stable and doing well, she is using her oxygen whenever she goes walking, but would not use it when she is at rest at home.  She is only running at 1 L right now.  She denies falls, injury or bleeding problems.  She has not had syncope or palpitations.  She does not think she's had any atrial fibrillation.  She denies angina, but is quite sedentary.  She does not have orthopnea or PND and denies leg edema.  No focal neurological events.  She describes occasional wheezing.  Glipizide was added to her diabetes regimen by Dr. Damita Dunnings.  [name] is a 83 year old woman with a history of coronary disease, now >16 years status post post bypass surgery (2003, [place] [facility]).  Angiography has demonstrated interval total occlusion of 3 saphenous vein grafts, patent LIMA to LAD, total occlusion of the major OM artery and the right coronary artery with some collateral filling). Ranexa has had a substantial impact with virtual abolition of exertional angina pectoris.  She has a history of paroxysmal atrial fibrillation. While on treatment with aspirin and Plavix she developed a spontaneous retroperitoneal hematoma. She is therefore not taking  anticoagulants. Ranexa may also be helping with a reduction in the burden of atrial fibrillation, without signs of QT interval prolongation. She has a moderate to severely dilated left atrium. Most recent EF assessment was >60% by scintigraphy and LV angiography in 2012, 60-65% by echo in April 2017.    Also has type 2 diabetes mellitus, mixed hyperlipidemia and systemic hypertension. She has had several serious falls and injuries due to poor balance, not syncope. She still lives alone. On oxygen supplementation with activity.  Past Medical History:  Diagnosis Date  . Anemia, iron deficiency   . Atrial fibrillation ([place]) 11/28/2010   2D Echo - EF-30-35, left atrium moderate to severely dilated, right ventricle moderately dilated, tricuspid valve mild-moderate regurgitation  . Cancer [facility])    R beast cancer followed by Dr. Hassell Done  . CAP (community acquired pneumonia)   . Chronic kidney disease (CKD), stage II (mild)   . Coronary artery disease   . Diabetes mellitus, type 2 ([place])   . Diverticulitis   . History of breast cancer    Followed yearly by [facility]  . Hyperlipemia   . Hypertension   . Nontraumatic retroperitoneal hematoma   . Osteoporosis   . Peripheral vascular disease [facility])     Past Surgical History:  Procedure Laterality Date  . APPENDECTOMY  1962  . BREAST SURGERY  02/01   Quadrectomy Breast cancer (Dr. Hassell Done)  . CARDIAC CATHETERIZATION  2000   Iliac Stent (Dr. Juanita Craver) yearly follow-up  . CARDIAC CATHETERIZATION  01/25/02   High grade LAD disease, total  of 3 vess, involvement  . CARDIAC CATHETERIZATION  01/13/07   SV graft to diag. occluded but good backfill o/w ok  . CARDIAC CATHETERIZATION  02/14/10   PTCA & stent vein graft to OM/RCA ([place])  . CARDIAC CATHETERIZATION  04/17/2011   Saphenous vein grafts to the RCA 100% occluded, to the OM 100% occluded, and the diagonal 100% occluded. Internal mammary artery to the LAD, widely patent.  Marland Kitchen CATARACT EXTRACTION  10/10  and 02/11  . COLOSTOMY    . CORONARY ARTERY BYPASS GRAFT  02/09/02   X 4 ([place])  . DILATION AND CURETTAGE OF UTERUS  1971    miscarrage, abnormal bleeding  . DILATION AND CURETTAGE OF UTERUS  1995   Menorrhagia  . HEMORRHOID SURGERY  09/11/04   Binding Hassell Done)  . PARTIAL COLECTOMY    . SHOULDER ARTHROSCOPY  05/04   Right, with rotator cuff debridement    Outpatient Medications Prior to Visit  Medication Sig Dispense Refill  . albuterol (PROVENTIL HFA;VENTOLIN HFA) 108 (90 Base) MCG/ACT inhaler Inhale 1-2 puffs into the lungs 3 (three) times daily as needed. 1 Inhaler 0  . amLODipine (NORVASC) 10 MG tablet Take one (1) tablet (10 mg) by mouth daily. 90 tablet 3  . aspirin EC 81 MG tablet Take 81 mg by mouth daily.    Marland Kitchen atorvastatin (LIPITOR) 20 MG tablet TAKE 1 TABLET BY MOUTH EVERYDAY AT BEDTIME 90 tablet 0  . BD INSULIN SYRINGE U/F 31G X 5/16" 0.3 ML MISC USE DAILY AND AS DIRECTED SLIDING SCALE. MULTIPLE INJECTIONS OF INSULIN PER DAY 200 each 1  . benazepril (LOTENSIN) 20 MG tablet TAKE 1 TABLET BY MOUTH EVERY DAY 90 tablet 3  . Cholecalciferol (VITAMIN D) 1000 UNITS capsule Take 1,000 Units by mouth 2 (two) times daily.      . fluticasone (FLOVENT HFA) 44 MCG/ACT inhaler Inhale 2 puffs into the lungs 2 (two) times daily. 10.6 Inhaler 5  . glucose blood (ONE TOUCH ULTRA TEST) test strip USE TO CHECK BLOOD SUGAR 4 TIMES A DAY. INSULIN-DEPENDENT. DIAGNOSIS: E11.9 100 each 5  . glyBURIDE (DIABETA) 5 MG tablet TAKE 2 TABLETS BY MOUTH DAILY WITH BREAKFAST. 180 tablet 1  . insulin aspart (NOVOLOG) 100 UNIT/ML injection USE PER SLIDING SCALE--200-250=4-5 UNITS, 251-300=5-6 UNITS,301-350=6-7 UNITS, WITH MEALS 30 mL 5  . meclizine (ANTIVERT) 25 MG tablet Take 1 tablet (25 mg total) by mouth daily as needed. For dizziness 30 tablet 6  . nitroGLYCERIN (NITROSTAT) 0.4 MG SL tablet Place 1 tablet (0.4 mg total) under the tongue as needed for chest pain. As directed 25 tablet 11  . NON  FORMULARY Oxygen 2 liters 24/7    . nystatin (MYCOSTATIN) 100000 UNIT/ML suspension Take 5 mLs (500,000 Units total) by mouth 4 (four) times daily. Swish and swallow 120 mL 0  . nystatin (MYCOSTATIN/NYSTOP) 100000 UNIT/GM POWD Apply up to 3 times a day if needed. 15 g 5  . Polyvinyl Alcohol-Povidone (REFRESH OP) Place 1 drop into both eyes daily as needed.    . ranolazine (RANEXA) 500 MG 12 hr tablet TAKE 1 TABLET BY MOUTH TWICE A DAY 180 tablet 1  . sotalol (BETAPACE) 80 MG tablet TAKE 0.5 TABLETS (40 MG TOTAL) BY MOUTH 2 (TWO) TIMES DAILY. 90 tablet 3   No facility-administered medications prior to visit.      Allergies:   Actos [pioglitazone]; Metformin and related; and Zocor [simvastatin]   Social History   Socioeconomic History  . Marital status: Widowed  Spouse name: Not on file  . Number of children: 2  . Years of education: Not on file  . Highest education level: Not on file  Occupational History  . Occupation: Retired  Scientific laboratory technician  . Financial resource strain: Not on file  . Food insecurity:    Worry: Not on file    Inability: Not on file  . Transportation needs:    Medical: Not on file    Non-medical: Not on file  Tobacco Use  . Smoking status: Never Smoker  . Smokeless tobacco: Never Used  Substance and Sexual Activity  . Alcohol use: No    Alcohol/week: 0.0 standard drinks  . Drug use: No  . Sexual activity: Never  Lifestyle  . Physical activity:    Days per week: Not on file    Minutes per session: Not on file  . Stress: Not on file  Relationships  . Social connections:    Talks on phone: Not on file    Gets together: Not on file    Attends religious service: Not on file    Active member of club or organization: Not on file    Attends meetings of clubs or organizations: Not on file    Relationship status: Not on file  Other Topics Concern  . Not on file  Social History Narrative   Retired 1991 from [employer] work   Widowed as of 04/2011  after 50+ years   Lives alone   2 kids in [place]   Enjoys travelling and reading     Family History:  The patient's family history includes Breast cancer in her maternal aunt; COPD in her brother; Diabetes in her mother and son; Gout in her brother; Heart disease in her brother, brother, and mother; Heart disease (age of onset: 75) in her father; Hyperlipidemia in her brother; Hypertension in her brother, brother, brother, father, mother, and son; Obesity in her brother.   ROS:   Please see the history of present illness.    ROS All other systems are reviewed and are negative   PHYSICAL EXAM:   VS:  BP 138/64   Pulse 67   Ht 5\' 2"  (1.575 m)   Wt 130 lb (59 kg)   BMI 23.78 kg/m      General: Alert, oriented x3, no distress, appears elderly and frail but is smiling and looks comfortable Head: no evidence of trauma, PERRL, EOMI, no exophtalmos or lid lag, no myxedema, no xanthelasma; normal ears, nose and oropharynx Neck: normal jugular venous pulsations and no hepatojugular reflux; brisk carotid pulses without delay and no carotid bruits Chest: Few wheezes are heard in the right base, no signs of consolidation by percussion or palpation, normal fremitus, symmetrical and full respiratory excursions Cardiovascular: normal position and quality of the apical impulse, regular rhythm, normal first and second heart sounds, 1/6 apical holosystolic murmur, no diastolic murmurs, rubs or gallops Abdomen: no tenderness or distention, no masses by palpation, no abnormal pulsatility or arterial bruits, normal bowel sounds, no hepatosplenomegaly Extremities: no clubbing, cyanosis or edema; 2+ radial, ulnar and brachial pulses bilaterally; 2+ right femoral, posterior tibial and dorsalis pedis pulses; 2+ left femoral, posterior tibial and dorsalis pedis pulses; no subclavian or femoral bruits Neurological: grossly nonfocal Psych: Normal mood and affect   Wt Readings from Last 3 Encounters:    08/09/18 130 lb (59 kg)  04/11/18 130 lb 12 oz (59.3 kg)  01/21/18 127 lb 9.6 oz (57.9 kg)     Studies/Labs  Reviewed:   EKG:  EKG is ordered today.  The ekg ordered today shows sinus rhythm with QS pattern in leads V1-V2, no repolarization abnormalities, QTC 443 ms  Recent Labs: 04/04/2018: BUN 22; Creatinine, Ser 1.10; Hemoglobin 11.1; Platelets 247.0; Potassium 5.2; Sodium 139   Lipid Panel    Component Value Date/Time   CHOL 154 07/12/2017 0932   TRIG 81 07/12/2017 0932   HDL 47 07/12/2017 0932   CHOLHDL 3.3 07/12/2017 0932   CHOLHDL 3 12/09/2015 0853   VLDL 20.4 12/09/2015 0853   LDLCALC 91 07/12/2017 0932   LDLDIRECT 87.3 11/21/2008 1446    ASSESSMENT:    1. Chronic diastolic heart failure ([place])   2. Coronary artery disease of autologous vein bypass graft with stable angina pectoris (HCC)   3. Paroxysmal atrial fibrillation ([place])   4. Hypercholesterolemia   5. Diabetes mellitus with peripheral vascular disease ([place])   6. PAD (peripheral artery disease) ([place])   7. Essential hypertension      PLAN:  In order of problems listed above:  1. CHF: As far as I can tell she is euvolemic.  She is currently not taking any diuretics.  Limited by unsteady gait and respiratory problems.  Overall NYHA functional class III. 2. CAD: Angina well controlled on a combination of amlodipine and ranolazine.  Sotalol may also be acting as an antianginal beta-blocker.  And possibly benefiting from the beta-blocker effects of sotalol. She is essentially dependent on the LIMA to LAD bypass graft, with occlusion of all 3 vein grafts as well as the native oblique marginal and native right coronary artery. There were no good options for either percutaneous or surgical revascularization when she last had a heart catheterization in 2012. 3. AFib: No clinical episodes of atrial fibrillation on combination of sotalol endocrinology and.  QTc interval is not only acceptable, but actually in normal range.   Unable to take anticoagulation due to history of severe retroperitoneal bleeding when on aspirin/clopidogrel combination. She has also had at least one serious fall, but no falls recently. CHADSVasc 7 (age 57, gender, diabetes, hypertension, heart failure, vascular disease).  Fortunately she has not had stroke/TIA or any embolic events. 4. HLP: All her lipid parameters are in the desirable range close to it.  LDL is greater than 70, but she had to stop Zetia since she was unable to afford it. 5. DM: Most recent hemoglobin A1c in July was slightly out of range at 8.1%, up from 7.6% in March 6. PAD: Does not have any symptoms of claudication, although this could be due to the fact that she is very sedentary.   7. HTN: Acceptable control.  Avoid increased medications due to tendency for falls.  Medication Adjustments/Labs and Tests Ordered: Current medicines are reviewed at length with the patient today.  Concerns regarding medicines are outlined above.  Medication changes, Labs and Tests ordered today are listed in the Patient Instructions below. Patient Instructions  Medication Instructions:  Dr Sallyanne Kuster recommends that you continue on your current medications as directed. Please refer to the Current Medication list given to you today.  If you need a refill on your cardiac medications before your next appointment, please call your pharmacy.   Follow-Up: At [facility], you and your health needs are our priority.  As part of our continuing mission to provide you with exceptional heart care, we have created designated Provider Care Teams.  These Care Teams include your primary Cardiologist (physician) and Advanced Practice Providers (APPs -  Physician Assistants and Nurse Practitioners) who all work together to provide you with the care you need, when you need it. You will need a follow up appointment in 6 months.  Please call our office 2 months in advance to schedule this appointment.  You may see  Sanda Klein, MD or one of the following Advanced Practice Providers on your designated Care Team: [place], [place] . Fabian Sharp, PA-C      Signed, Sanda Klein, MD  08/14/2018 10:09 AM    [facility] [address], [place], [place]  [address] Phone: [phone]; Fax: [phone]

## 2018-08-14 ENCOUNTER — Encounter: Payer: Self-pay | Admitting: Cardiovascular Disease

## 2018-08-15 ENCOUNTER — Other Ambulatory Visit: Payer: Self-pay | Admitting: Cardiovascular Disease

## 2018-08-15 NOTE — Telephone Encounter (Signed)
Rx has been sent to the pharmacy electronically. ° °

## 2018-09-04 ENCOUNTER — Other Ambulatory Visit: Payer: Self-pay | Admitting: Family Medicine

## 2018-09-04 DIAGNOSIS — E1159 Type 2 diabetes mellitus with other circulatory complications: Secondary | ICD-10-CM

## 2018-09-04 DIAGNOSIS — E559 Vitamin D deficiency, unspecified: Secondary | ICD-10-CM

## 2018-09-06 ENCOUNTER — Other Ambulatory Visit: Payer: Self-pay | Admitting: Cardiovascular Disease

## 2018-09-07 ENCOUNTER — Ambulatory Visit (INDEPENDENT_AMBULATORY_CARE_PROVIDER_SITE_OTHER): Payer: Medicare Other

## 2018-09-07 VITALS — BP 128/60 | HR 63 | Temp 97.7°F | Ht 63.0 in | Wt 129.5 lb

## 2018-09-07 DIAGNOSIS — E559 Vitamin D deficiency, unspecified: Secondary | ICD-10-CM | POA: Diagnosis not present

## 2018-09-07 DIAGNOSIS — E1159 Type 2 diabetes mellitus with other circulatory complications: Secondary | ICD-10-CM | POA: Diagnosis not present

## 2018-09-07 DIAGNOSIS — Z0000 Encounter for general adult medical examination without abnormal findings: Secondary | ICD-10-CM | POA: Diagnosis not present

## 2018-09-07 LAB — TSH: TSH: 2.96 u[IU]/mL (ref 0.35–4.50)

## 2018-09-07 LAB — VITAMIN D 25 HYDROXY (VIT D DEFICIENCY, FRACTURES): VITD: 59.47 ng/mL (ref 30.00–100.00)

## 2018-09-07 LAB — COMPREHENSIVE METABOLIC PANEL
ALT: 9 U/L (ref 0–35)
AST: 12 U/L (ref 0–37)
Albumin: 4.4 g/dL (ref 3.5–5.2)
Alkaline Phosphatase: 64 U/L (ref 39–117)
BUN: 23 mg/dL (ref 6–23)
CO2: 29 mEq/L (ref 19–32)
Calcium: 9.9 mg/dL (ref 8.4–10.5)
Chloride: 102 mEq/L (ref 96–112)
Creatinine, Ser: 1.14 mg/dL (ref 0.40–1.20)
GFR: 48.6 mL/min — ABNORMAL LOW (ref >60.00)
Glucose, Bld: 159 mg/dL — ABNORMAL HIGH (ref 70–99)
Potassium: 5 mEq/L (ref 3.5–5.1)
Sodium: 139 mEq/L (ref 135–145)
Total Bilirubin: 0.6 mg/dL (ref 0.2–1.2)
Total Protein: 7.2 g/dL (ref 6.0–8.3)

## 2018-09-07 LAB — CBC WITH DIFFERENTIAL/PLATELET
Basophils Absolute: 0.1 10*3/uL (ref 0.0–0.1)
Basophils Relative: 0.8 % (ref 0.0–3.0)
Eosinophils Absolute: 0.1 10*3/uL (ref 0.0–0.7)
Eosinophils Relative: 0.8 % (ref 0.0–5.0)
HCT: 35.3 % — ABNORMAL LOW (ref 36.0–46.0)
Hemoglobin: 11.5 g/dL — ABNORMAL LOW (ref 12.0–15.0)
Lymphocytes Relative: 19.3 % (ref 12.0–46.0)
Lymphs Abs: 1.5 10*3/uL (ref 0.7–4.0)
MCHC: 32.5 g/dL (ref 30.0–36.0)
MCV: 90.9 fl (ref 78.0–100.0)
Monocytes Absolute: 2 10*3/uL — ABNORMAL HIGH (ref 0.1–1.0)
Monocytes Relative: 26.2 % — ABNORMAL HIGH (ref 3.0–12.0)
Neutro Abs: 4 10*3/uL (ref 1.4–7.7)
Neutrophils Relative %: 52.9 % (ref 43.0–77.0)
Platelets: 270 10*3/uL (ref 150.0–400.0)
RBC: 3.89 Mil/uL (ref 3.87–5.11)
RDW: 14.9 % (ref 11.5–15.5)
WBC: 7.6 10*3/uL (ref 4.0–10.5)

## 2018-09-07 LAB — LIPID PANEL
Cholesterol: 163 mg/dL (ref 0–200)
HDL: 52.5 mg/dL (ref >39.00)
LDL Cholesterol: 90 mg/dL (ref 0–99)
NONHDL: 110.54
Total CHOL/HDL Ratio: 3
Triglycerides: 103 mg/dL (ref 0.0–149.0)
VLDL: 20.6 mg/dL (ref 0.0–40.0)

## 2018-09-07 LAB — HEMOGLOBIN A1C: Hgb A1c MFr Bld: 8 % — ABNORMAL HIGH (ref 4.6–6.5)

## 2018-09-07 NOTE — Progress Notes (Signed)
PCP notes:   Health maintenance:  A1C - completed  Abnormal screenings:   Hearing - failed  Hearing Screening   125Hz  250Hz  500Hz  1000Hz  2000Hz  3000Hz  4000Hz  6000Hz  8000Hz   Right ear:   40 40 40  0    Left ear:   40 40 40  0     Patient concerns:   None  Nurse concerns:  None  Next PCP appt:   09/13/18 @ 1045

## 2018-09-07 NOTE — Progress Notes (Signed)
I reviewed health advisor's note, was available for consultation, and agree with documentation and plan.  

## 2018-09-07 NOTE — Patient Instructions (Signed)
[name] , Thank you for taking time to come for your Medicare Wellness Visit. I appreciate your ongoing commitment to your health goals. Please review the following plan we discussed and let me know if I can assist you in the future.   These are the goals we discussed: Goals    . Increase physical activity     Starting 09/07/2018, I will continue to walk at least 20 min daily.        This is a list of the screening recommended for you and due dates:  Health Maintenance  Topic Date Due  . Hemoglobin A1C  03/09/2019  . Complete foot exam   04/12/2019  . Eye exam for diabetics  04/20/2019  . Tetanus Vaccine  06/16/2026  . Flu Shot  Completed  . DEXA scan (bone density measurement)  Completed  . Pneumonia vaccines  Completed   Preventive Care for Adults  A healthy lifestyle and preventive care can promote health and wellness. Preventive health guidelines for adults include the following key practices.  . A routine yearly physical is a good way to check with your health care provider about your health and preventive screening. It is a chance to share any concerns and updates on your health and to receive a thorough exam.  . Visit your dentist for a routine exam and preventive care every 6 months. Brush your teeth twice a day and floss once a day. Good oral hygiene prevents tooth decay and gum disease.  . The frequency of eye exams is based on your age, health, family medical history, use  of contact lenses, and other factors. Follow your health care provider's recommendations for frequency of eye exams.  . Eat a healthy diet. Foods like vegetables, fruits, whole grains, low-fat dairy products, and lean protein foods contain the nutrients you need without too many calories. Decrease your intake of foods high in solid fats, added sugars, and salt. Eat the right amount of calories for you. Get information about a proper diet from your health care provider, if necessary.  . Regular  physical exercise is one of the most important things you can do for your health. Most adults should get at least 150 minutes of moderate-intensity exercise (any activity that increases your heart rate and causes you to sweat) each week. In addition, most adults need muscle-strengthening exercises on 2 or more days a week.  Silver Sneakers may be a benefit available to you. To determine eligibility, you may visit the website: www.silversneakers.com or contact program at 506-856-0669 Mon-Fri between 8AM-8PM.   . Maintain a healthy weight. The body mass index (BMI) is a screening tool to identify possible weight problems. It provides an estimate of body fat based on height and weight. Your health care provider can find your BMI and can help you achieve or maintain a healthy weight.   For adults 20 years and older: ? A BMI below 18.5 is considered underweight. ? A BMI of 18.5 to 24.9 is normal. ? A BMI of 25 to 29.9 is considered overweight. ? A BMI of 30 and above is considered obese.   . Maintain normal blood lipids and cholesterol levels by exercising and minimizing your intake of saturated fat. Eat a balanced diet with plenty of fruit and vegetables. Blood tests for lipids and cholesterol should begin at age 63 and be repeated every 5 years. If your lipid or cholesterol levels are high, you are over 50, or you are at high risk for heart  disease, you may need your cholesterol levels checked more frequently. Ongoing high lipid and cholesterol levels should be treated with medicines if diet and exercise are not working.  . If you smoke, find out from your health care provider how to quit. If you do not use tobacco, please do not start.  . If you choose to drink alcohol, please do not consume more than 2 drinks per day. One drink is considered to be 12 ounces (355 mL) of beer, 5 ounces (148 mL) of wine, or 1.5 ounces (44 mL) of liquor.  . If you are 30-36 years old, ask your health care provider if  you should take aspirin to prevent strokes.  . Use sunscreen. Apply sunscreen liberally and repeatedly throughout the day. You should seek shade when your shadow is shorter than you. Protect yourself by wearing long sleeves, pants, a wide-brimmed hat, and sunglasses year round, whenever you are outdoors.  . Once a month, do a whole body skin exam, using a mirror to look at the skin on your back. Tell your health care provider of new moles, moles that have irregular borders, moles that are larger than a pencil eraser, or moles that have changed in shape or color.

## 2018-09-07 NOTE — Progress Notes (Signed)
Subjective:   [name] is a 83 y.o. female who presents for Medicare Annual (Subsequent) preventive examination.  Review of Systems:  N/A Cardiac Risk Factors include: advanced age (>51men, >38 women);diabetes mellitus;dyslipidemia;hypertension     Objective:     Vitals: BP 128/60 (BP Location: Left Arm, Patient Position: Sitting, Cuff Size: Normal)   Pulse 63   Temp 97.7 F (36.5 C) (Oral)   Ht 5\' 3"  (1.6 m) Comment: SHOES  Wt 129 lb 8 oz (58.7 kg)   SpO2 93%   BMI 22.94 kg/m   Body mass index is 22.94 kg/m.  Advanced Directives 09/07/2018 09/02/2017 06/01/2017 04/13/2017 11/24/2016 11/20/2016 07/14/2016  Does Patient Have a Medical Advance Directive? No No No No No No No  Would patient like information on creating a medical advance directive? No - Patient declined Yes (MAU/Ambulatory/Procedural Areas - Information given) No - Patient declined No - Patient declined No - Patient declined - -  Pre-existing out of facility DNR order (yellow form or pink MOST form) - - - - - - -    Tobacco Social History   Tobacco Use  Smoking Status Never Smoker  Smokeless Tobacco Never Used     Counseling given: No   Clinical Intake:  Pre-visit preparation completed: Yes  Pain : No/denies pain Pain Score: 0-No pain     Nutritional Status: BMI 25 -29 Overweight Nutritional Risks: None Diabetes: Yes CBG done?: No Did pt. bring in CBG monitor from home?: No  How often do you need to have someone help you when you read instructions, pamphlets, or other written materials from your doctor or pharmacy?: 1 - Never What is the last grade level you completed in school?: 12th grade  Interpreter Needed?: No  Comments: pt lives alone Information entered by :: LPinson, LPN  Past Medical History:  Diagnosis Date  . Anemia, iron deficiency   . Atrial fibrillation ([place]) 11/28/2010   2D Echo - EF-30-35, left atrium moderate to severely dilated, right ventricle moderately dilated,  tricuspid valve mild-moderate regurgitation  . Cancer [facility])    R beast cancer followed by Dr. Hassell Done  . CAP (community acquired pneumonia)   . Chronic kidney disease (CKD), stage II (mild)   . Coronary artery disease   . Diabetes mellitus, type 2 ([place])   . Diverticulitis   . History of breast cancer    Followed yearly by [facility]  . Hyperlipemia   . Hypertension   . Nontraumatic retroperitoneal hematoma   . Osteoporosis   . Peripheral vascular disease [facility])    Past Surgical History:  Procedure Laterality Date  . APPENDECTOMY  1962  . BREAST SURGERY  02/01   Quadrectomy Breast cancer (Dr. Hassell Done)  . CARDIAC CATHETERIZATION  2000   Iliac Stent (Dr. Juanita Craver) yearly follow-up  . CARDIAC CATHETERIZATION  01/25/02   High grade LAD disease, total of 3 vess, involvement  . CARDIAC CATHETERIZATION  01/13/07   SV graft to diag. occluded but good backfill o/w ok  . CARDIAC CATHETERIZATION  02/14/10   PTCA & stent vein graft to OM/RCA ([place])  . CARDIAC CATHETERIZATION  04/17/2011   Saphenous vein grafts to the RCA 100% occluded, to the OM 100% occluded, and the diagonal 100% occluded. Internal mammary artery to the LAD, widely patent.  Marland Kitchen CATARACT EXTRACTION  10/10 and 02/11  . COLOSTOMY    . CORONARY ARTERY BYPASS GRAFT  02/09/02   X 4 ([place])  . Joiner  miscarrage, abnormal bleeding  . DILATION AND CURETTAGE OF UTERUS  1995   Menorrhagia  . HEMORRHOID SURGERY  09/11/04   Binding Hassell Done)  . PARTIAL COLECTOMY    . SHOULDER ARTHROSCOPY  05/04   Right, with rotator cuff debridement   Family History  Problem Relation Age of Onset  . Hypertension Mother   . Diabetes Mother   . Heart disease Mother   . Hypertension Father   . Heart disease Father 34       Heart attack  . Heart disease Brother        CHF  . Hypertension Brother   . COPD Brother        bronchial, frequent pneumonia  . Hypertension Brother   . Hyperlipidemia Brother   . Heart  disease Brother        CABG  . Hypertension Brother   . Gout Brother   . Obesity Brother   . Diabetes Son   . Hypertension Son   . Breast cancer Maternal Aunt   . Colon cancer Neg Hx    Social History   Socioeconomic History  . Marital status: Widowed    Spouse name: Not on file  . Number of children: 2  . Years of education: Not on file  . Highest education level: Not on file  Occupational History  . Occupation: Retired  Scientific laboratory technician  . Financial resource strain: Not on file  . Food insecurity:    Worry: Not on file    Inability: Not on file  . Transportation needs:    Medical: Not on file    Non-medical: Not on file  Tobacco Use  . Smoking status: Never Smoker  . Smokeless tobacco: Never Used  Substance and Sexual Activity  . Alcohol use: No    Alcohol/week: 0.0 standard drinks  . Drug use: No  . Sexual activity: Never  Lifestyle  . Physical activity:    Days per week: Not on file    Minutes per session: Not on file  . Stress: Not on file  Relationships  . Social connections:    Talks on phone: Not on file    Gets together: Not on file    Attends religious service: Not on file    Active member of club or organization: Not on file    Attends meetings of clubs or organizations: Not on file    Relationship status: Not on file  Other Topics Concern  . Not on file  Social History Narrative   Retired 1991 from [employer] work   Widowed as of 04/2011 after 50+ years   Lives alone   2 kids in [place]   Enjoys travelling and reading    Outpatient Encounter Medications as of 09/07/2018  Medication Sig  . albuterol (PROVENTIL HFA;VENTOLIN HFA) 108 (90 Base) MCG/ACT inhaler Inhale 1-2 puffs into the lungs 3 (three) times daily as needed.  Marland Kitchen amLODipine (NORVASC) 10 MG tablet TAKE ONE TABLET (10 MG) BY MOUTH DAILY.  Marland Kitchen aspirin EC 81 MG tablet Take 81 mg by mouth daily.  Marland Kitchen atorvastatin (LIPITOR) 20 MG tablet TAKE 1 TABLET BY MOUTH EVERYDAY AT BEDTIME  . BD  INSULIN SYRINGE U/F 31G X 5/16" 0.3 ML MISC USE DAILY AND AS DIRECTED SLIDING SCALE. MULTIPLE INJECTIONS OF INSULIN PER DAY  . benazepril (LOTENSIN) 20 MG tablet TAKE 1 TABLET BY MOUTH EVERY DAY  . Cholecalciferol (VITAMIN D) 1000 UNITS capsule Take 1,000 Units by mouth 2 (two) times daily.    Marland Kitchen  fluticasone (FLOVENT HFA) 44 MCG/ACT inhaler Inhale 2 puffs into the lungs 2 (two) times daily.  Marland Kitchen glucose blood (ONE TOUCH ULTRA TEST) test strip USE TO CHECK BLOOD SUGAR 4 TIMES A DAY. INSULIN-DEPENDENT. DIAGNOSIS: E11.9  . glyBURIDE (DIABETA) 5 MG tablet TAKE 2 TABLETS BY MOUTH DAILY WITH BREAKFAST.  Marland Kitchen insulin aspart (NOVOLOG) 100 UNIT/ML injection USE PER SLIDING SCALE--200-250=4-5 UNITS, 251-300=5-6 UNITS,301-350=6-7 UNITS, WITH MEALS  . meclizine (ANTIVERT) 25 MG tablet Take 1 tablet (25 mg total) by mouth daily as needed. For dizziness  . nitroGLYCERIN (NITROSTAT) 0.4 MG SL tablet Place 1 tablet (0.4 mg total) under the tongue as needed for chest pain. As directed  . NON FORMULARY Oxygen 2 liters 24/7  . nystatin (MYCOSTATIN) 100000 UNIT/ML suspension Take 5 mLs (500,000 Units total) by mouth 4 (four) times daily. Swish and swallow  . nystatin (MYCOSTATIN/NYSTOP) 100000 UNIT/GM POWD Apply up to 3 times a day if needed.  . Polyvinyl Alcohol-Povidone (REFRESH OP) Place 1 drop into both eyes daily as needed.  . ranolazine (RANEXA) 500 MG 12 hr tablet TAKE 1 TABLET BY MOUTH TWICE A DAY  . ranolazine (RANEXA) 500 MG 12 hr tablet TAKE 1 TABLET BY MOUTH TWICE A DAY  . sotalol (BETAPACE) 80 MG tablet TAKE 0.5 TABLETS (40 MG TOTAL) BY MOUTH 2 (TWO) TIMES DAILY.   No facility-administered encounter medications on file as of 09/07/2018.     Activities of Daily Living In your present state of health, do you have any difficulty performing the following activities: 09/07/2018  Hearing? Y  Vision? N  Difficulty concentrating or making decisions? N  Walking or climbing stairs? Y  Dressing or bathing? N  Doing  errands, shopping? N  Preparing Food and eating ? N  Using the Toilet? N  In the past six months, have you accidently leaked urine? N  Do you have problems with loss of bowel control? N  Managing your Medications? N  Managing your Finances? N  Housekeeping or managing your Housekeeping? N  Some recent data might be hidden    Patient Care Team: Tonia Ghent, MD as PCP - General (Family Medicine) Croitoru, Dani Gobble, MD as PCP - Cardiology (Cardiology) Dasher, Rayvon Char, MD as Referring Physician (Dermatology) Estill Cotta, MD as Referring Physician (Ophthalmology) Chesley Mires, MD as Consulting Physician (Pulmonary Disease) Parrett, Fonnie Mu, NP as Nurse Practitioner (Pulmonary Disease) Marc Morgans, DMD, PA as Referring Physician (Dentistry)    Assessment:   This is a routine wellness examination for Jelisa.   Hearing Screening   125Hz  250Hz  500Hz  1000Hz  2000Hz  3000Hz  4000Hz  6000Hz  8000Hz   Right ear:   40 40 40  0    Left ear:   40 40 40  0    Vision Screening Comments: Vision exam in July 2019 with Dr. Remo Lipps Dingeldein   Exercise Activities and Dietary recommendations Current Exercise Habits: The patient does not participate in regular exercise at present, Exercise limited by: None identified  Goals    . Increase physical activity     Starting 09/07/2018, I will continue to walk at least 20 min daily.        Fall Risk Fall Risk  09/07/2018 09/02/2017 03/11/2016 01/03/2015  Falls in the past year? 0 Yes No Yes  Number falls in past yr: - 1 - 1  Injury with Fall? - Yes - Yes   Depression Screen PHQ 2/9 Scores 09/07/2018 09/02/2017 03/11/2016 01/03/2015  PHQ - 2 Score 0 0 0 0  PHQ- 9  Score 0 0 - -     Cognitive Function MMSE - Mini Mental State Exam 09/07/2018 09/02/2017 03/11/2016  Orientation to time 5 5 5   Orientation to Place 5 5 5   Registration 3 3 3   Attention/ Calculation 0 0 0  Recall 3 3 3   Language- name 2 objects 0 0 0  Language- repeat 1 1 1   Language-  follow 3 step command 3 3 3   Language- read & follow direction 0 0 0  Write a sentence 0 0 0  Copy design 0 0 0  Total score 20 20 20        PLEASE NOTE: A Mini-Cog screen was completed. Maximum score is 20. A value of 0 denotes this part of Folstein MMSE was not completed or the patient failed this part of the Mini-Cog screening.   Mini-Cog Screening Orientation to Time - Max 5 pts Orientation to Place - Max 5 pts Registration - Max 3 pts Recall - Max 3 pts Language Repeat - Max 1 pts Language Follow 3 Step Command - Max 3 pts   Immunization History  Administered Date(s) Administered  . Influenza Split 07/09/2011  . Influenza Whole 08/05/2006, 07/28/2007, 07/02/2008  . Influenza, High Dose Seasonal PF 07/07/2017, 07/28/2018  . Influenza,inj,Quad PF,6+ Mos 07/19/2013, 06/25/2014, 08/13/2015, 06/16/2016  . Pneumococcal Conjugate-13 01/03/2015  . Pneumococcal Polysaccharide-23 11/02/2005  . Td 01/10/2001  . Tdap 09/18/2011, 06/16/2016  . Zoster 07/08/2006    Screening Tests Health Maintenance  Topic Date Due  . HEMOGLOBIN A1C  03/09/2019  . FOOT EXAM  04/12/2019  . OPHTHALMOLOGY EXAM  04/20/2019  . TETANUS/TDAP  06/16/2026  . INFLUENZA VACCINE  Completed  . DEXA SCAN  Completed  . PNA vac Low Risk Adult  Completed       Plan:     I have personally reviewed, addressed, and noted the following in the patient's chart:  A. Medical and social history B. Use of alcohol, tobacco or illicit drugs  C. Current medications and supplements D. Functional ability and status E.  Nutritional status F.  Physical activity G. Advance directives H. List of other physicians I.  Hospitalizations, surgeries, and ER visits in previous 12 months J.  [place] to include hearing, vision, cognitive, depression L. Referrals and appointments - none  In addition, I have reviewed and discussed with patient certain preventive protocols, quality metrics, and best practice  recommendations. A written personalized care plan for preventive services as well as general preventive health recommendations were provided to patient.  See attached scanned questionnaire for additional information.   Signed,   Lindell Noe, MHA, BS, LPN Health Coach

## 2018-09-13 ENCOUNTER — Ambulatory Visit (INDEPENDENT_AMBULATORY_CARE_PROVIDER_SITE_OTHER): Payer: Medicare Other | Admitting: Family Medicine

## 2018-09-13 ENCOUNTER — Encounter: Payer: Self-pay | Admitting: Family Medicine

## 2018-09-13 VITALS — BP 128/60 | HR 63 | Temp 97.7°F | Ht 63.0 in | Wt 129.5 lb

## 2018-09-13 DIAGNOSIS — K862 Cyst of pancreas: Secondary | ICD-10-CM | POA: Diagnosis not present

## 2018-09-13 DIAGNOSIS — R7989 Other specified abnormal findings of blood chemistry: Secondary | ICD-10-CM

## 2018-09-13 DIAGNOSIS — I10 Essential (primary) hypertension: Secondary | ICD-10-CM | POA: Diagnosis not present

## 2018-09-13 DIAGNOSIS — R0902 Hypoxemia: Secondary | ICD-10-CM | POA: Diagnosis not present

## 2018-09-13 DIAGNOSIS — E1159 Type 2 diabetes mellitus with other circulatory complications: Secondary | ICD-10-CM

## 2018-09-13 DIAGNOSIS — I25708 Atherosclerosis of coronary artery bypass graft(s), unspecified, with other forms of angina pectoris: Secondary | ICD-10-CM | POA: Diagnosis not present

## 2018-09-13 DIAGNOSIS — I739 Peripheral vascular disease, unspecified: Secondary | ICD-10-CM | POA: Diagnosis not present

## 2018-09-13 DIAGNOSIS — W19XXXA Unspecified fall, initial encounter: Secondary | ICD-10-CM

## 2018-09-13 DIAGNOSIS — Y92009 Unspecified place in unspecified non-institutional (private) residence as the place of occurrence of the external cause: Secondary | ICD-10-CM

## 2018-09-13 DIAGNOSIS — E785 Hyperlipidemia, unspecified: Secondary | ICD-10-CM | POA: Diagnosis not present

## 2018-09-13 DIAGNOSIS — Z0000 Encounter for general adult medical examination without abnormal findings: Secondary | ICD-10-CM

## 2018-09-13 NOTE — Patient Instructions (Addendum)
Plan on recheck in about 4 months.  The only lab you need to have done for your next diabetic visit is an A1c.  We can do this with a fingerstick test at the office visit.  You do not need a lab visit ahead of time for this.  It does not matter if you are fasting when the lab is done.    We can also do the monocyte/white blood count test at the visit.   Take care.  Glad to see you.  Update me as needed.

## 2018-09-13 NOTE — Progress Notes (Signed)
Diabetes:  Using medications without difficulties: yes Hypoglycemic episodes: rare, cautions d/w pt.  Hyperglycemic episodes: rarely, up to 400 but that was atypical.   Feet problems: no Blood Sugars averaging: usually ~100 in the AM eye exam within last year: yes Diet d/w pt.    Hypertension:    Using medication without problems or lightheadedness:  yes Chest pain with exertion:no Edema: L>R ankle at baseline.   Short of breath: not SOB with O2 use.    Elevated Cholesterol: Using medications without problems:yes Muscle aches: no Diet compliance: encouraged.  Exercise: limited given age and 33 requirements.   She is still using CPAP at baseline.  She still has trouble with nasal dryness but is already using a humidifier on her equipment.    Mild anemia, longstanding. Monocyte elevation stable, d/w pt.  Discussed with patient.  Still on O2 concentrator.  She was able to get a smaller unit but she had pay out of pocket.    Fall caution d/w pt.   Declined hearing aids.   Pap and colonoscopy not due.   Mammogram declined.   DXA declined by patient.  Vaccines up to date other than shingrix out of stock.    Pancreatic cyst f/u not due, last checked 04/2018.   PMH and SH reviewed  Meds, vitals, and allergies reviewed.   ROS: Per HPI unless specifically indicated in ROS section   GEN: nad, alert and oriented HEENT: mucous membranes moist NECK: supple w/o LA CV: rrr. PULM: ctab, no inc wob ABD: soft, +bs EXT: Trace- left greater than right -BLE edema SKIN: no acute rash  Diabetic foot exam: Normal inspection No skin breakdown No calluses  Dec but intact DP pulses Normal sensation to light touch and monofilament Nails normal

## 2018-09-14 NOTE — Assessment & Plan Note (Signed)
History of but no recent falls.  Cautions discussed with patient.

## 2018-09-14 NOTE — Assessment & Plan Note (Signed)
History of mild anemia and monocyte elevation.  This appears stable. No new or alarming findings.  We can recheck periodically.  Discussed with patient detail.  She agrees.

## 2018-09-14 NOTE — Assessment & Plan Note (Addendum)
She is using O2 concentrator during the day.  She is using CPAP at night.  She is tolerating that.  Compliant.

## 2018-09-14 NOTE — Assessment & Plan Note (Signed)
Able to tolerate statin.  Given her age I did not increase her statin as I do not want to increase her risk of myopathy.  Continue as is.  She agrees.

## 2018-09-14 NOTE — Assessment & Plan Note (Signed)
Fall caution d/w pt.   Declined hearing aids.   Pap and colonoscopy not due.   Mammogram declined.   DXA declined by patient.  Vaccines up to date other than shingrix out of stock.

## 2018-09-14 NOTE — Assessment & Plan Note (Signed)
Reasonable control.  She is tolerating current medications.  I do not want to induce hypotension.  Continue as is.  She agrees.

## 2018-09-14 NOTE — Assessment & Plan Note (Signed)
Pancreatic cyst f/u not due, last checked 04/2018.  Discussed with patient.

## 2018-09-14 NOTE — Assessment & Plan Note (Signed)
Continue risk factor modification.  She does not have rest pain.  She has intact but diminished dorsalis pedis pulses.

## 2018-09-14 NOTE — Assessment & Plan Note (Signed)
Goal A1c would be around 8.  I do not want to induce hypoglycemia.  Cautions discussed with patient.  Recheck periodically.  She agrees.  Okay for outpatient follow-up. >25 minutes spent in face to face time with patient, >50% spent in counselling or coordination of care.

## 2018-10-03 ENCOUNTER — Other Ambulatory Visit: Payer: Self-pay | Admitting: Cardiovascular Disease

## 2018-10-08 ENCOUNTER — Other Ambulatory Visit: Payer: Self-pay | Admitting: Family Medicine

## 2018-12-06 IMAGING — DX DG CHEST 2V
2 series · 2 of 2 positions shown · non-contrast
Comparison: 12/31/2016

CLINICAL DATA: Hypoxia

EXAM:
CHEST  2 VIEW

[chest pa]
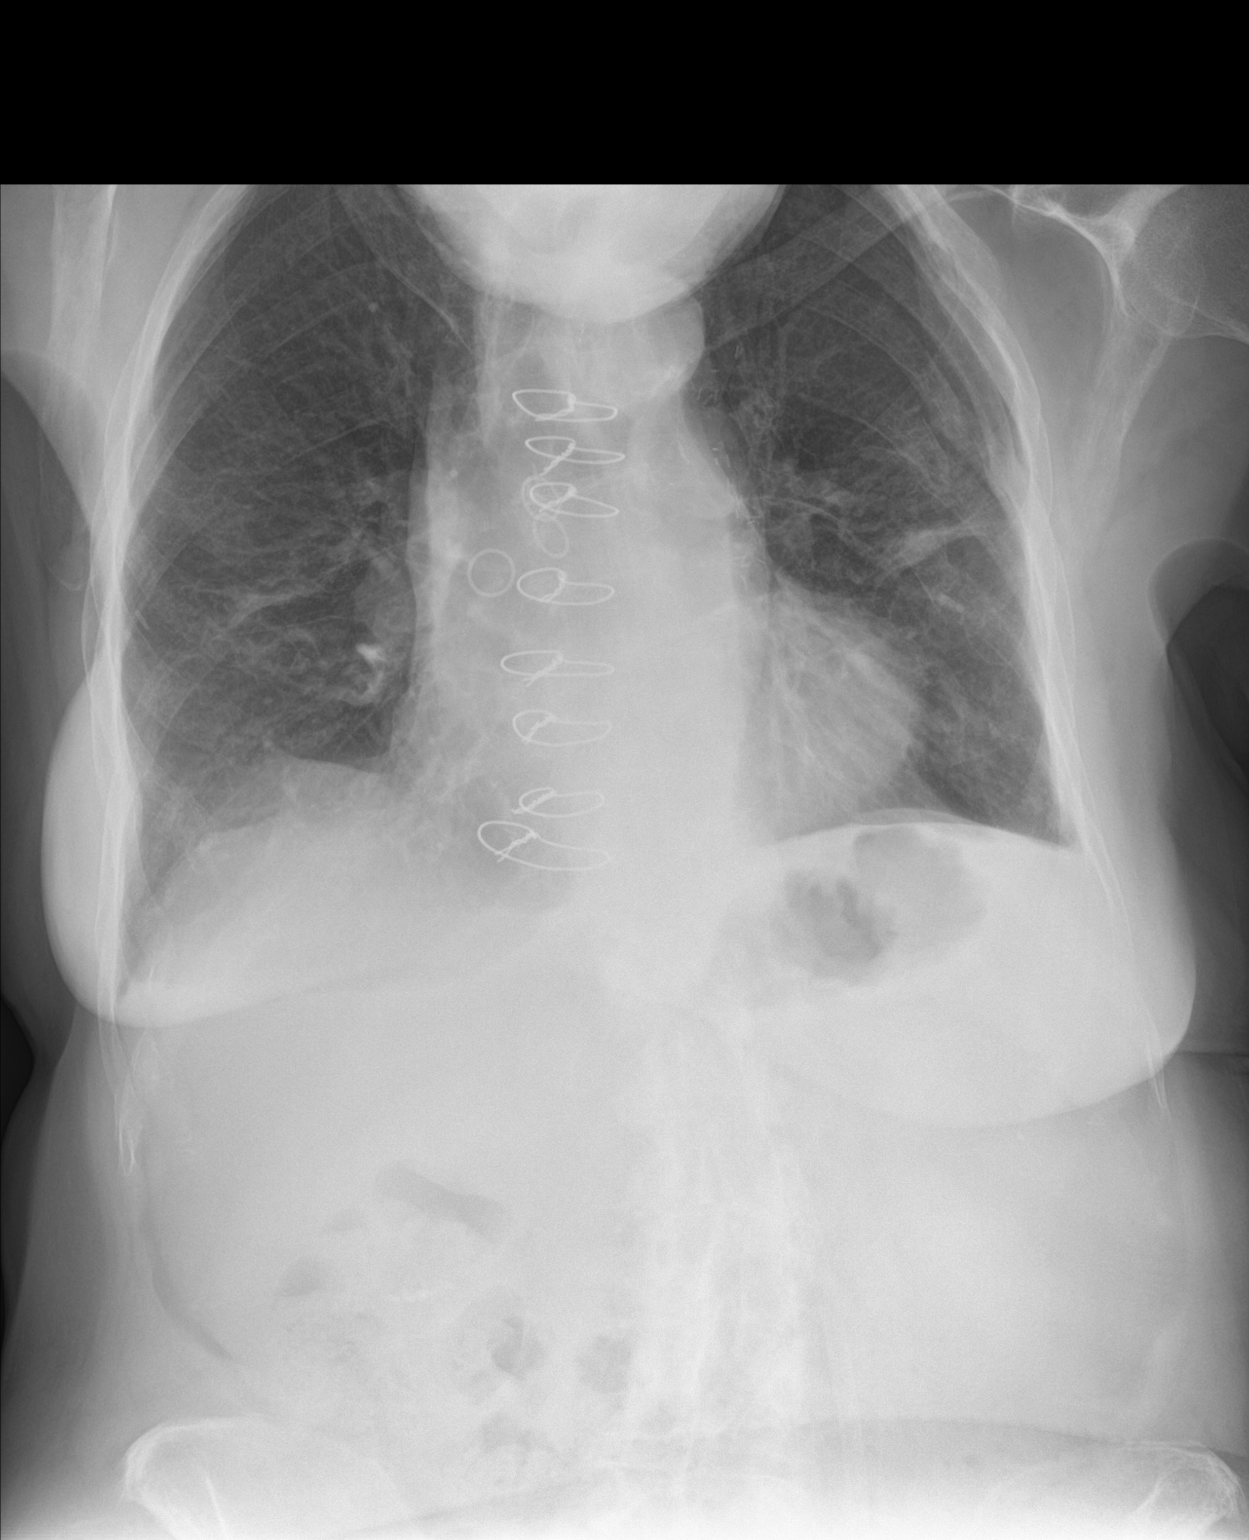

[chest lat]
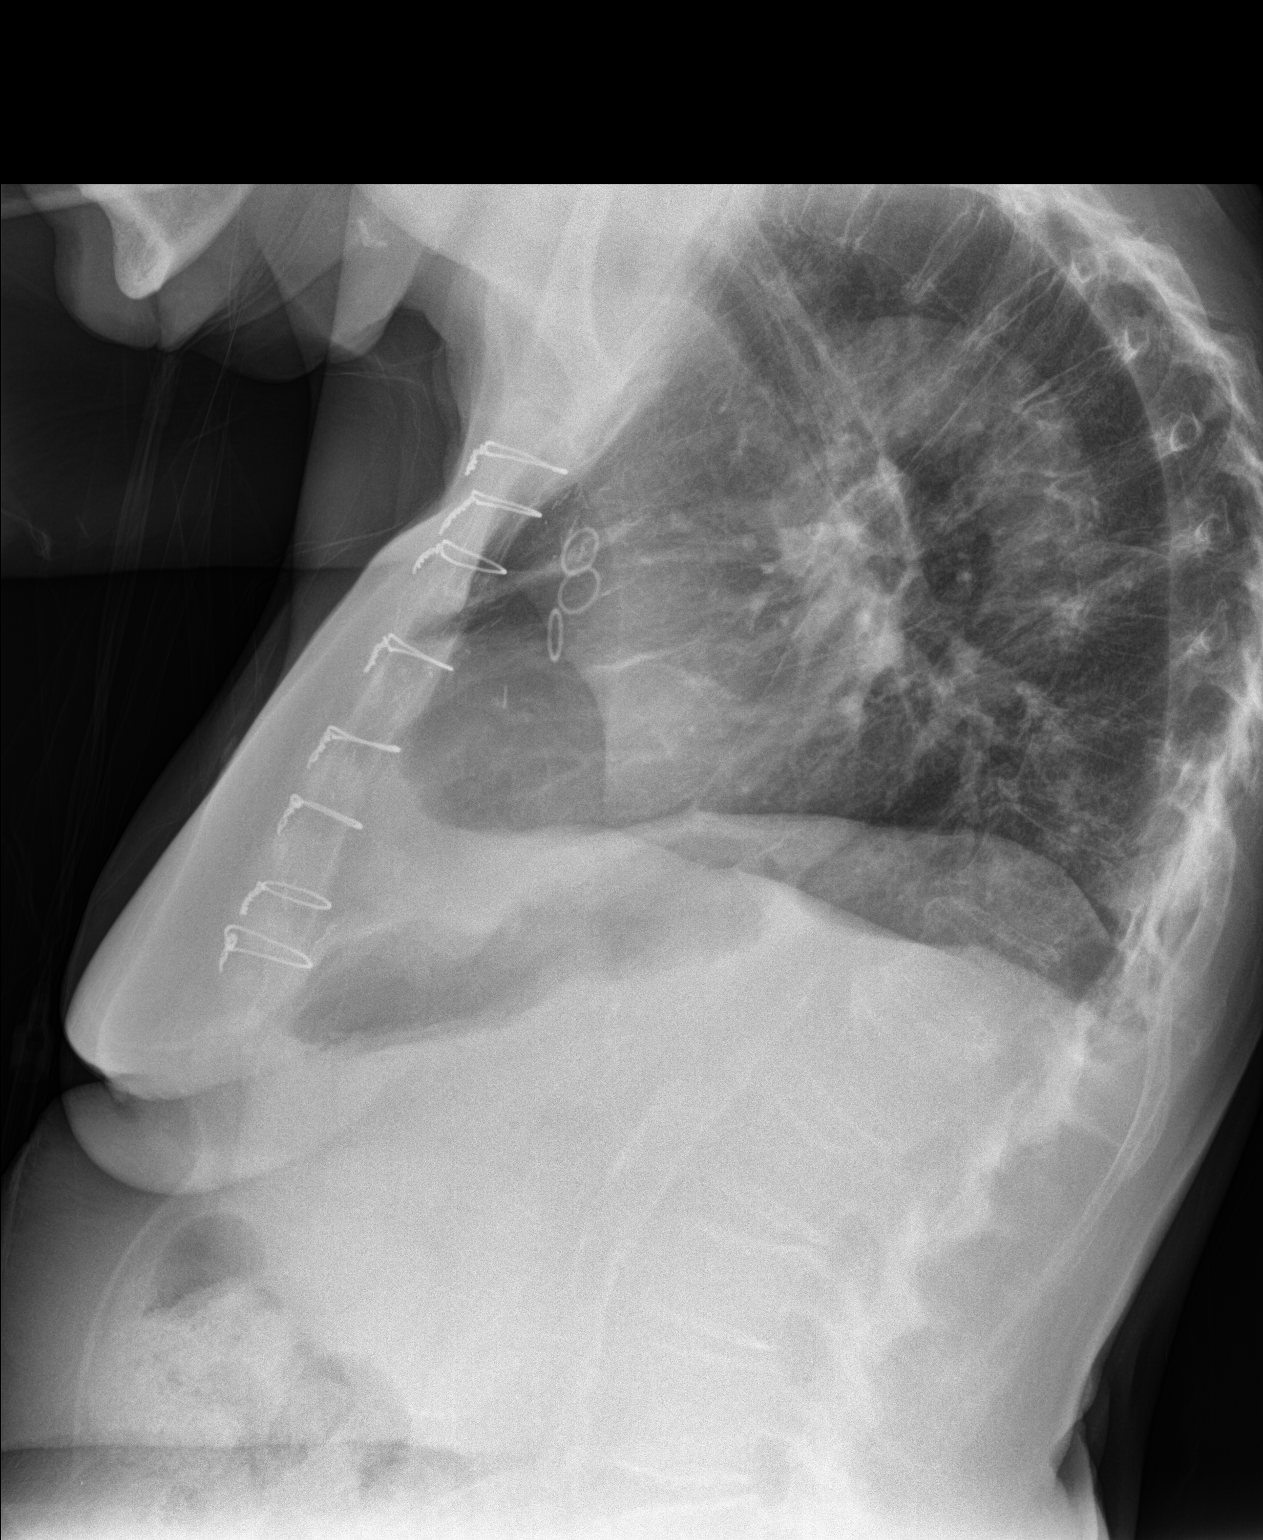

[2 of 2 positions shown; findings below may reference images not displayed]

FINDINGS: Mild right basilar airspace disease likely reflecting atelectasis.
Small area of linear airspace disease in the left mid lung likely
reflecting atelectasis. No pleural effusion or pneumothorax. Stable
cardiomediastinal silhouette. Prior CABG. No acute osseous
abnormality.
IMPRESSION: No active cardiopulmonary disease.

## 2018-12-12 ENCOUNTER — Other Ambulatory Visit: Payer: Self-pay | Admitting: Family Medicine

## 2018-12-16 ENCOUNTER — Other Ambulatory Visit: Payer: Self-pay | Admitting: Pulmonary Disease

## 2018-12-20 ENCOUNTER — Other Ambulatory Visit: Payer: Self-pay | Admitting: Family Medicine

## 2018-12-28 ENCOUNTER — Ambulatory Visit: Payer: Medicare Other | Admitting: Pulmonary Disease

## 2019-01-13 ENCOUNTER — Other Ambulatory Visit: Payer: Self-pay | Admitting: Family Medicine

## 2019-01-16 ENCOUNTER — Other Ambulatory Visit (INDEPENDENT_AMBULATORY_CARE_PROVIDER_SITE_OTHER): Payer: Medicare Other

## 2019-01-16 DIAGNOSIS — R7989 Other specified abnormal findings of blood chemistry: Secondary | ICD-10-CM

## 2019-01-16 DIAGNOSIS — E1159 Type 2 diabetes mellitus with other circulatory complications: Secondary | ICD-10-CM

## 2019-01-16 LAB — CBC WITH DIFFERENTIAL/PLATELET
Basophils Absolute: 0.1 10*3/uL (ref 0.0–0.1)
Basophils Relative: 1.4 % (ref 0.0–3.0)
Eosinophils Absolute: 0.1 10*3/uL (ref 0.0–0.7)
Eosinophils Relative: 1 % (ref 0.0–5.0)
HCT: 35.6 % — ABNORMAL LOW (ref 36.0–46.0)
Hemoglobin: 11.9 g/dL — ABNORMAL LOW (ref 12.0–15.0)
Lymphocytes Relative: 17.5 % (ref 12.0–46.0)
Lymphs Abs: 1.8 10*3/uL (ref 0.7–4.0)
MCHC: 33.3 g/dL (ref 30.0–36.0)
MCV: 89.2 fl (ref 78.0–100.0)
Monocytes Absolute: 2.6 10*3/uL — ABNORMAL HIGH (ref 0.1–1.0)
Monocytes Relative: 25.8 % — ABNORMAL HIGH (ref 3.0–12.0)
Neutro Abs: 5.5 10*3/uL (ref 1.4–7.7)
Neutrophils Relative %: 54.3 % (ref 43.0–77.0)
Platelets: 275 10*3/uL (ref 150.0–400.0)
RBC: 3.99 Mil/uL (ref 3.87–5.11)
RDW: 16.1 % — ABNORMAL HIGH (ref 11.5–15.5)
WBC: 10.1 10*3/uL (ref 4.0–10.5)

## 2019-01-16 LAB — HEMOGLOBIN A1C: Hgb A1c MFr Bld: 8.6 % — ABNORMAL HIGH (ref 4.6–6.5)

## 2019-01-19 ENCOUNTER — Ambulatory Visit (INDEPENDENT_AMBULATORY_CARE_PROVIDER_SITE_OTHER): Payer: Medicare Other | Admitting: Family Medicine

## 2019-01-19 ENCOUNTER — Encounter: Payer: Self-pay | Admitting: Family Medicine

## 2019-01-19 VITALS — BP 143/71 | HR 56 | Ht 63.0 in | Wt 128.0 lb

## 2019-01-19 DIAGNOSIS — R7989 Other specified abnormal findings of blood chemistry: Secondary | ICD-10-CM | POA: Diagnosis not present

## 2019-01-19 DIAGNOSIS — E1159 Type 2 diabetes mellitus with other circulatory complications: Secondary | ICD-10-CM

## 2019-01-19 MED ORDER — ALBUTEROL SULFATE HFA 108 (90 BASE) MCG/ACT IN AERS: 1.0000 | INHALATION_SPRAY | Freq: Three times a day (TID) | RESPIRATORY_TRACT | Status: DC | PRN

## 2019-01-19 MED ORDER — NITROGLYCERIN 0.4 MG SL SUBL: 0.4000 mg | SUBLINGUAL_TABLET | SUBLINGUAL | 2 refills | Status: DC | PRN

## 2019-01-19 NOTE — Progress Notes (Signed)
Interactive audio and video telecommunications were attempted between this provider and patient, however failed, due to patient having technical difficulties OR patient did not have access to video capability.  We continued and completed visit with audio only.   Virtual Visit via Telephone Note  I connected with patient on 01/19/19 at 10:22 by telephone and verified that I am speaking with the correct person using two identifiers.  Location of patient: home.   Location of MD: [facility] Name of referring provider (if blank then none associated): Names per persons and role in encounter:  MD: Earlyne Iba, Patient: name listed above.    I discussed the limitations, risks, security and privacy concerns of performing an evaluation and management service by telephone and the availability of in person appointments. I also discussed with the patient that there may be a patient responsible charge related to this service. The patient expressed understanding and agreed to proceed.  History of Present Illness:   No h/o NTG use but needed rx on hand just in case, rx sent.   No recent use of SABA.    DM.  Using medications without difficulties: yes Hypoglycemic episodes: only rarely, cautions d/w pt.  Hyperglycemic episodes: rarely up to 400 with diet variation.  D/w pt- she had underdosed her insulin.   Feet problems: some L ankle swelling at baseline.  No tingling in the feet.   Blood Sugars averaging: usually ~100 in the AM eye exam within last year: yes Diet d/w pt.   A1c d/w pt.  Up slightly.  Goal A1c 8s.   Goal to avoid low sugars.  She agrees.     Abnormal CBC.  Mild anemia, longstanding. Monocyte elevation stable, d/w pt.  Discussed with patient.  No bleeding.    Family and friends are helping with groceries, etc.    Observations/Objective:nad Speaking in complete sentences.   Assessment and Plan: DM. Continue work on diet but not change in meds at this point.  D/w pt about  avoiding low sugars.  Recheck in about 3 months.  Update me as needed in the meantime.  Cautions d/w pt.  She is more sedentary with pandemic changes.  A1c d/w pt.  Up slightly.  Goal A1c 8s.   Goal to avoid low sugars.  She agrees.    We can consider change to longer acting insulin if needed in the future.    Abnormal CBC.  At baseline.  D/w pt.   Follow Up Instructions: Needs OV for 11:30 on 7/20.  We can do labs at the visit if visit is in person.  If she is going to need a phone call at that point, then we can do labs ahead of time.  Labs ordered.   She'll call about routine f/u with cards.    I discussed the assessment and treatment plan with the patient. The patient was provided an opportunity to ask questions and all were answered. The patient agreed with the plan and demonstrated an understanding of the instructions.   The patient was advised to call back or seek an in-person evaluation if the symptoms worsen or if the condition fails to improve as anticipated.  I provided 25 minutes of non-face-to-face time during this encounter.  [name], MD

## 2019-01-22 NOTE — Assessment & Plan Note (Signed)
Continue work on diet but not change in meds at this point.  D/w pt about avoiding low sugars.  Recheck in about 3 months.  Update me as needed in the meantime.  Cautions d/w pt.  She is more sedentary with pandemic changes.  A1c d/w pt.  Up slightly.  Goal A1c 8s.   Goal to avoid low sugars.  She agrees.    We can consider change to longer acting insulin if needed in the future.   Needs OV for 11:30 on 7/20.  We can do labs at the visit if visit is in person.  If she is going to need a phone call at that point, then we can do labs ahead of time.  Labs ordered.

## 2019-01-22 NOTE — Assessment & Plan Note (Signed)
At baseline.  D/w pt.  Continue to monitor episodically.  She agrees.  No new sx that would change management at this point.  She agrees.

## 2019-03-01 ENCOUNTER — Other Ambulatory Visit: Payer: Self-pay | Admitting: Cardiovascular Disease

## 2019-04-13 ENCOUNTER — Encounter: Payer: Self-pay | Admitting: Pulmonary Disease

## 2019-04-13 ENCOUNTER — Other Ambulatory Visit: Payer: Self-pay

## 2019-04-13 ENCOUNTER — Ambulatory Visit (INDEPENDENT_AMBULATORY_CARE_PROVIDER_SITE_OTHER): Payer: Medicare Other | Admitting: Pulmonary Disease

## 2019-04-13 VITALS — BP 122/64 | HR 66 | Temp 98.4°F | Ht 62.0 in | Wt 127.4 lb

## 2019-04-13 DIAGNOSIS — M419 Scoliosis, unspecified: Secondary | ICD-10-CM

## 2019-04-13 DIAGNOSIS — G4730 Sleep apnea, unspecified: Secondary | ICD-10-CM

## 2019-04-13 DIAGNOSIS — J9611 Chronic respiratory failure with hypoxia: Secondary | ICD-10-CM | POA: Diagnosis not present

## 2019-04-13 DIAGNOSIS — J4530 Mild persistent asthma, uncomplicated: Secondary | ICD-10-CM | POA: Diagnosis not present

## 2019-04-13 DIAGNOSIS — J9612 Chronic respiratory failure with hypercapnia: Secondary | ICD-10-CM

## 2019-04-13 DIAGNOSIS — G4733 Obstructive sleep apnea (adult) (pediatric): Secondary | ICD-10-CM | POA: Diagnosis not present

## 2019-04-13 DIAGNOSIS — J984 Other disorders of lung: Secondary | ICD-10-CM

## 2019-04-13 NOTE — Patient Instructions (Signed)
Follow up in 1 year.

## 2019-04-13 NOTE — Progress Notes (Signed)
[facility]  Chief Complaint  Patient presents with  . Follow-up    89yr f/u for OSA. Denies any sleep concerns, states machine has been working well for her.     Constitutional:  BP 122/64 (BP Location: Left Arm, Patient Position: Sitting, Cuff Size: Normal)   Pulse 66   Temp 98.4 F (36.9 C) (Oral)   Ht 5\' 2"  (1.575 m)   Wt 127 lb 6.4 oz (57.8 kg)   SpO2 99%   BMI 23.30 kg/m   Past Medical History:  PAD, Osteoporosis, HTN, HLD, Breast cancer, Diverticulitis, DM, CAD, CKD, CAP, A fib, Anemia  Brief Summary:  [name] is a 84 y.o. female chronic respiratory failure with hypoxia and hypercapnia with sleep disordered breathing in setting of restrictive lung disease with kyphoscoliosis and asthma.  She uses CPAP nightly.  No issue with mask fit.  Uses 2 liters O2 24/7.  Gets winded after vacuuming 2 rooms.  Not having cough, wheeze, or sputum.  Gets occasional sinus congestion.  Physical Exam:   Appearance - well kempt   ENMT - clear nasal mucosa, midline nasal  septum, no oral exudates, no LAN, trachea midline  Respiratory - normal chest wall, normal respiratory effort, no accessory muscle use, no wheeze/rales  CV - s1s2 regular rate and rhythm, no murmurs, no peripheral edema, radial pulses symmetric  GI - soft, non tender, no masses  Lymph - no adenopathy noted in neck and axillary areas  MSK - normal gait, kyphotic  Ext - no cyanosis, clubbing, or joint inflammation noted  Skin - no rashes, lesions, or ulcers  Neuro - normal strength, oriented x 3  Psych - normal mood and affect   Assessment/Plan:   Chronic hypoxic/hypercapnic respiratory failure with sleep disordered breathing in the setting of sleep apnea, and kyphoscoliosis with restrictive lung disease. - she is compliant with CPAP and reports benefit - continue CPAP 12 cm H2O - uses 2 liters 24/7 - goal SpO2 > 90%  Chronic obstructive asthma. - continue  flovent   Patient Instructions  Follow up in 1 year    Chesley Mires, MD [place] Pager: [phone] 04/13/2019, 12:08 PM  Flow Sheet     Pulmonary tests:  Ambulatory oximetry 01/27/17 >>SpO2 86% after 2 laps. Placed on 2 liters PFT 02/04/17 >> FEV1 0.73 (45%), FEV1% 77, TLC 2.84 (63%), DLCO 45%, + BD CT chest 02/05/17 >> atherosclerosis, s/p CABG, mild pleural thickening b/l, 4 mm RLL nodule, healed rib fx's, kyphosis, 2.6 cm low density lesion in pancreatic tail  Sleep tests:  ONO with 2 liters 02/04/17 >>test time 7 hrs 52 min. Average SpO2 95%, low SpO2 83%. Spent 5 min with SpO2 <88%. PSG 04/13/17 >>RDI 33.9. Test done with 2 liters oxygen. CPAP titration 06/01/17 >> CPAP 12 with 2 liters oxygen CPAP 11/27/17 to 12/26/17 >> used on 30 of 30 nights with average 7 hrs 40 min. Average AHI 2.5 with CPAP 12 cm H2O.  Air leak.  Cardiac tests:  Echo 01/31/16 >> EF 60 to 65%  Medications:   Allergies as of 04/13/2019      Reactions   Actos [pioglitazone] Other (See Comments)   edema   Metformin And Related Other (See Comments)   Held due to renal function   Zocor [simvastatin] Other (See Comments)   Myalgias      Medication List       Accurate as of April 13, 2019 12:08 PM. If you  have any questions, ask your nurse or doctor.        albuterol 108 (90 Base) MCG/ACT inhaler Commonly known as: VENTOLIN HFA Inhale 1-2 puffs into the lungs 3 (three) times daily as needed.   amLODipine 10 MG tablet Commonly known as: NORVASC TAKE ONE TABLET (10 MG) BY MOUTH DAILY.   aspirin EC 81 MG tablet Take 81 mg by mouth daily.   atorvastatin 20 MG tablet Commonly known as: LIPITOR TAKE 1 TABLET BY MOUTH EVERYDAY AT BEDTIME   BD Insulin Syringe U/F 31G X 5/16" 0.3 ML Misc Generic drug: Insulin Syringe-Needle U-100 USE DAILY AND AS DIRECTED SLIDING SCALE. MULTIPLE INJECTIONS OF INSULIN PER DAY   benazepril 20 MG tablet Commonly known as: LOTENSIN TAKE 1  TABLET BY MOUTH EVERY DAY   Flovent HFA 44 MCG/ACT inhaler Generic drug: fluticasone TAKE 2 PUFFS BY MOUTH TWICE A DAY   glucose blood test strip USE TO CHECK BLOOD SUGAR 4 TIMES A DAY. INSULIN-DEPENDENT. DIAGNOSIS: E11.9   glyBURIDE 5 MG tablet Commonly known as: DIABETA TAKE 2 TABLETS BY MOUTH DAILY WITH BREAKFAST.   insulin aspart 100 UNIT/ML injection Commonly known as: NovoLOG USE PER SLIDING SCALE--200-250=4-5 UNITS, 251-300=5-6 UNITS,301-350=6-7 UNITS, WITH MEALS   meclizine 25 MG tablet Commonly known as: ANTIVERT Take 1 tablet (25 mg total) by mouth daily as needed. For dizziness   nitroGLYCERIN 0.4 MG SL tablet Commonly known as: NITROSTAT Place 1 tablet (0.4 mg total) under the tongue as needed for chest pain. As directed   NON FORMULARY Oxygen 2 liters 24/7   nystatin powder Generic drug: nystatin Apply up to 3 times a day if needed.   ranolazine 500 MG 12 hr tablet Commonly known as: RANEXA TAKE 1 TABLET BY MOUTH TWICE A DAY   REFRESH OP Place 1 drop into both eyes daily as needed.   sotalol 80 MG tablet Commonly known as: BETAPACE TAKE 0.5 TABLETS (40 MG TOTAL) BY MOUTH 2 (TWO) TIMES DAILY.   Vitamin D 1000 units capsule Take 1,000 Units by mouth 2 (two) times daily.       Past Surgical History:  She  has a past surgical history that includes Appendectomy (1962); Dilation and curettage of uterus (1971 ); Dilation and curettage of uterus (1995); Breast surgery (02/01); Coronary artery bypass graft (02/09/02); Shoulder arthroscopy (05/04); Hemorrhoid surgery (09/11/04); Cataract extraction (10/10 and 02/11); Colostomy; Partial colectomy; Cardiac catheterization (2000); Cardiac catheterization (01/25/02); Cardiac catheterization (01/13/07); Cardiac catheterization (02/14/10); and Cardiac catheterization (04/17/2011).  Family History:  Her family history includes Breast cancer in her maternal aunt; COPD in her brother; Diabetes in her mother and son; Gout  in her brother; Heart disease in her brother, brother, and mother; Heart disease (age of onset: 36) in her father; Hyperlipidemia in her brother; Hypertension in her brother, brother, brother, father, mother, and son; Obesity in her brother.  Social History:  She  reports that she has never smoked. She has never used smokeless tobacco. She reports that she does not drink alcohol or use drugs.

## 2019-04-24 ENCOUNTER — Encounter: Payer: Self-pay | Admitting: Family Medicine

## 2019-04-24 ENCOUNTER — Ambulatory Visit (INDEPENDENT_AMBULATORY_CARE_PROVIDER_SITE_OTHER): Payer: Medicare Other | Admitting: Family Medicine

## 2019-04-24 ENCOUNTER — Other Ambulatory Visit: Payer: Self-pay

## 2019-04-24 DIAGNOSIS — E1159 Type 2 diabetes mellitus with other circulatory complications: Secondary | ICD-10-CM | POA: Diagnosis not present

## 2019-04-24 DIAGNOSIS — R7989 Other specified abnormal findings of blood chemistry: Secondary | ICD-10-CM | POA: Diagnosis not present

## 2019-04-24 LAB — CBC WITH DIFFERENTIAL/PLATELET
Basophils Absolute: 0.1 10*3/uL (ref 0.0–0.1)
Basophils Relative: 0.7 % (ref 0.0–3.0)
Eosinophils Absolute: 0.1 10*3/uL (ref 0.0–0.7)
Eosinophils Relative: 1.2 % (ref 0.0–5.0)
HCT: 36.8 % (ref 36.0–46.0)
Hemoglobin: 12 g/dL (ref 12.0–15.0)
Lymphocytes Relative: 15.5 % (ref 12.0–46.0)
Lymphs Abs: 1.4 10*3/uL (ref 0.7–4.0)
MCHC: 32.6 g/dL (ref 30.0–36.0)
MCV: 91.8 fl (ref 78.0–100.0)
Monocytes Absolute: 2.5 10*3/uL — ABNORMAL HIGH (ref 0.1–1.0)
Monocytes Relative: 26.8 % — ABNORMAL HIGH (ref 3.0–12.0)
Neutro Abs: 5.2 10*3/uL (ref 1.4–7.7)
Neutrophils Relative %: 55.8 % (ref 43.0–77.0)
Platelets: 251 10*3/uL (ref 150.0–400.0)
RBC: 4.01 Mil/uL (ref 3.87–5.11)
RDW: 15.2 % (ref 11.5–15.5)
WBC: 9.3 10*3/uL (ref 4.0–10.5)

## 2019-04-24 LAB — HEMOGLOBIN A1C: Hgb A1c MFr Bld: 8 % — ABNORMAL HIGH (ref 4.6–6.5)

## 2019-04-24 NOTE — Progress Notes (Signed)
Diabetes:  Using medications without difficulties: yes Hypoglycemic episodes: rare lows, cautions d/w pt.  If this happens it is usually at night.   Hyperglycemic episodes: no Feet problems: only some baseline BLE edema.  Blood Sugars averaging: usually ~100 in the AMs.  eye exam within last year: due this year when possible.   Labs pending.   She has a healing skin tear/bruise on the L forearm after the strap on her oxygen concentrator scraped her arm. It is slowly healing.    D/w pt about repeat CBC given h/o abnormal CBC.  No fevers.  No chills.  No abnormal bleeding.  Pandemic considerations discussed with patient.  Meds, vitals, and allergies reviewed.   ROS: Per HPI unless specifically indicated in ROS section   GEN: nad, alert and oriented HEENT: mucous membranes moist NECK: supple w/o LA CV: rrr. PULM: ctab, no inc wob ABD: soft, +bs EXT: trace BLE edema SKIN: no acute rash,  healing skin tear/bruise on the L forearm

## 2019-04-24 NOTE — Patient Instructions (Addendum)
Go to the lab on the way out.  We'll contact you with your lab report. We may need to adjust your meds based on your labs.  Recheck in about 4 months as scheduled.  Update me as needed.

## 2019-04-26 NOTE — Assessment & Plan Note (Signed)
Goal to avoid hypoglycemia.  See notes on labs.  See above. >25 minutes spent in face to face time with patient, >50% spent in counselling or coordination of care.

## 2019-04-26 NOTE — Assessment & Plan Note (Signed)
See notes on labs.  History of abnormal CBC with abnormal monocyte percentage.  No bleeding.  Still okay for outpatient follow-up.

## 2019-05-04 ENCOUNTER — Telehealth: Payer: Self-pay

## 2019-05-04 NOTE — Telephone Encounter (Signed)
Please let patient know that Dr. Halford Chessman can gather information regarding her CPAP use. The only way to gather information regarding her oxygen use at night would be to arrange for an overnight oximetry. We generally don't order this on a regular basis for follow up, but instead order if there has been some change in her clinical course.   Have her contact him if questions. Thanks. Patient advised and verbalized understanding

## 2019-05-04 NOTE — Telephone Encounter (Signed)
Patient states she was suppose to call back to let us know how she was doing with her sugars. She states she is doing very well. She is using sliding scale insulin around 4 units in the evening before 9 pm and not any later. Depending on what she eats in the evening sometimes sugar is 350 and sometimes less. She is not having any drops in her sugar as she was when she was using insulin later than 9 pm. She feels good about the regimen at this time. FYI To PCP

## 2019-05-05 NOTE — Telephone Encounter (Signed)
Noted.  The goal is to avoid low sugars.  If she keeps having sugars greater than 300 then let me know as we may need to increase her evening dose slightly.  Thanks.

## 2019-05-05 NOTE — Telephone Encounter (Signed)
Patient advised and verbalized understanding 

## 2019-05-18 ENCOUNTER — Ambulatory Visit (INDEPENDENT_AMBULATORY_CARE_PROVIDER_SITE_OTHER): Payer: Medicare Other | Admitting: Cardiovascular Disease

## 2019-05-18 ENCOUNTER — Other Ambulatory Visit: Payer: Self-pay

## 2019-05-18 ENCOUNTER — Encounter: Payer: Self-pay | Admitting: Cardiovascular Disease

## 2019-05-18 VITALS — BP 150/75 | HR 83 | Ht 62.0 in | Wt 127.2 lb

## 2019-05-18 DIAGNOSIS — I10 Essential (primary) hypertension: Secondary | ICD-10-CM

## 2019-05-18 DIAGNOSIS — E7800 Pure hypercholesterolemia, unspecified: Secondary | ICD-10-CM | POA: Diagnosis not present

## 2019-05-18 DIAGNOSIS — I25708 Atherosclerosis of coronary artery bypass graft(s), unspecified, with other forms of angina pectoris: Secondary | ICD-10-CM

## 2019-05-18 DIAGNOSIS — I5032 Chronic diastolic (congestive) heart failure: Secondary | ICD-10-CM

## 2019-05-18 DIAGNOSIS — Z79899 Other long term (current) drug therapy: Secondary | ICD-10-CM

## 2019-05-18 DIAGNOSIS — Z5181 Encounter for therapeutic drug level monitoring: Secondary | ICD-10-CM

## 2019-05-18 DIAGNOSIS — I739 Peripheral vascular disease, unspecified: Secondary | ICD-10-CM

## 2019-05-18 DIAGNOSIS — E119 Type 2 diabetes mellitus without complications: Secondary | ICD-10-CM

## 2019-05-18 DIAGNOSIS — I480 Paroxysmal atrial fibrillation: Secondary | ICD-10-CM | POA: Diagnosis not present

## 2019-05-18 MED ORDER — ATORVASTATIN CALCIUM 80 MG PO TABS: 80.0000 mg | ORAL_TABLET | Freq: Every day | ORAL | 3 refills | Status: DC

## 2019-05-18 NOTE — Patient Instructions (Addendum)
Medication Instructions:  INCREASE the Atorvastatin to 80 mg once daily  If you need a refill on your cardiac medications before your next appointment, please call your pharmacy.   Lab work: Your provider would like for you to return in 3-6 months to have the following labs drawn: Fasting Lipid. You do not need an appointment for the lab. Once in our office lobby there is a podium where you can sign in and ring the doorbell to alert [place] that you are here. The lab is open from 8:00 am to 4:30 pm; closed for lunch from 12:45pm-1:45pm.  If you have labs (blood work) drawn today and your tests are completely normal, you will receive your results only by: [place] (if you have MyChart) OR A paper copy in the mail If you have any lab test that is abnormal or we need to change your treatment, we will call you to review the results.  Testing/Procedures: None ordered  Follow-Up: At [facility], you and your health needs are our priority.  As part of our continuing mission to provide you with exceptional heart care, we have created designated Provider Care Teams.  These Care Teams include your primary Cardiologist (physician) and Advanced Practice Providers (APPs -  Physician Assistants and Nurse Practitioners) who all work together to provide you with the care you need, when you need it. You will need a follow up appointment in 6 months.  Please call our office 2 months in advance to schedule this appointment.  You may see Sanda Klein, MD or one of the following Advanced Practice Providers on your designated Care Team: Almyra Deforest, PA-C Fabian Sharp, [place]

## 2019-05-18 NOTE — Progress Notes (Signed)
Patient ID: [name], female   DOB: Feb 05, 1935, 84 y.o.   MRN: [id number]    Cardiology Office Note    Date:  05/21/2019   ID:  [name], DOB 01-27-1935, MRN [id number]  PCP:  Tonia Ghent, MD  Cardiologist:   Sanda Klein, MD   chief complaint: Follow-up coronary disease and atrial fibrillation, chronic sotalol therapy   History of Present Illness:  [name] is a 84 y.o. female who presents for Follow-up for coronary artery disease status post previous bypass surgery with exertional angina , paroxysmal atrial fibrillation on sotalol therapy , not on anticoagulation due to history of spontaneous retroperitoneal hematoma, treated hypertension, hyperlipidemia and diabetes mellitus complicated by PAD and chronic kidney disease stage II, COPD on chronic O2.  Uses O2 most of the time, always when she is away from home. The patient specifically denies any chest pain at rest or with exertion, orthopnea, paroxysmal nocturnal dyspnea, syncope, palpitations, focal neurological deficits, intermittent claudication, lower extremity edema, unexplained weight gain, cough, hemoptysis or wheezing. No recent falls or bleeding.  [name] is a 84 year old woman with a history of coronary disease, now >16 years status post post bypass surgery (2003, [place] [facility]).  Angiography has demonstrated interval total occlusion of 3 saphenous vein grafts, patent LIMA to LAD, total occlusion of the major OM artery and the right coronary artery with some collateral filling). Ranexa has had a substantial impact with virtual abolition of exertional angina pectoris.  She has a history of paroxysmal atrial fibrillation. While on treatment with aspirin and Plavix she developed a spontaneous retroperitoneal hematoma. She is therefore not taking anticoagulants. Ranexa may also be helping with a reduction in the burden of atrial fibrillation, without signs of QT interval prolongation. She has a moderate to severely  dilated left atrium. Most recent EF assessment was >60% by scintigraphy and LV angiography in 2012, 60-65% by echo in April 2017.    Also has type 2 diabetes mellitus, mixed hyperlipidemia and systemic hypertension. She has had several serious falls and injuries due to poor balance, not syncope. She still lives alone. On oxygen supplementation with activity.  Past Medical History:  Diagnosis Date   Anemia, iron deficiency    Atrial fibrillation ([place]) 11/28/2010   2D Echo - EF-30-35, left atrium moderate to severely dilated, right ventricle moderately dilated, tricuspid valve mild-moderate regurgitation   Cancer ([place])    R beast cancer followed by Dr. Hassell Done   CAP (community acquired pneumonia)    Chronic kidney disease (CKD), stage II (mild)    Coronary artery disease    Diabetes mellitus, type 2 ([place])    Diverticulitis    History of breast cancer    Followed yearly by [facility]   Hyperlipemia    Hypertension    Nontraumatic retroperitoneal hematoma    Osteoporosis    Peripheral vascular disease [facility])     Past Surgical History:  Procedure Laterality Date   APPENDECTOMY  1962   BREAST SURGERY  02/01   Quadrectomy Breast cancer (Dr. Hassell Done)   [place]  2000   Iliac Stent (Dr. Juanita Craver) yearly follow-up   CARDIAC CATHETERIZATION  01/25/02   High grade LAD disease, total of 3 vess, involvement   CARDIAC CATHETERIZATION  01/13/07   SV graft to diag. occluded but good backfill o/w ok   CARDIAC CATHETERIZATION  02/14/10   PTCA & stent vein graft to OM/RCA ([place])   CARDIAC CATHETERIZATION  04/17/2011   Saphenous vein grafts to  the RCA 100% occluded, to the OM 100% occluded, and the diagonal 100% occluded. Internal mammary artery to the LAD, widely patent.   CATARACT EXTRACTION  10/10 and 02/11   COLOSTOMY     CORONARY ARTERY BYPASS GRAFT  02/09/02   X 4 ([place])   DILATION AND CURETTAGE OF UTERUS  1971    miscarrage, abnormal bleeding    DILATION AND CURETTAGE OF UTERUS  1995   Menorrhagia   HEMORRHOID SURGERY  09/11/04   Binding Hassell Done)   PARTIAL COLECTOMY     SHOULDER ARTHROSCOPY  05/04   Right, with rotator cuff debridement    Outpatient Medications Prior to Visit  Medication Sig Dispense Refill   albuterol (PROVENTIL HFA;VENTOLIN HFA) 108 (90 Base) MCG/ACT inhaler Inhale 1-2 puffs into the lungs 3 (three) times daily as needed.     amLODipine (NORVASC) 10 MG tablet TAKE ONE TABLET (10 MG) BY MOUTH DAILY. 90 tablet 3   aspirin EC 81 MG tablet Take 81 mg by mouth daily.     BD INSULIN SYRINGE U/F 31G X 5/16" 0.3 ML MISC USE DAILY AND AS DIRECTED SLIDING SCALE. MULTIPLE INJECTIONS OF INSULIN PER DAY 100 each 5   benazepril (LOTENSIN) 20 MG tablet TAKE 1 TABLET BY MOUTH EVERY DAY 90 tablet 1   Cholecalciferol (VITAMIN D) 1000 UNITS capsule Take 1,000 Units by mouth 2 (two) times daily.       FLOVENT HFA 44 MCG/ACT inhaler TAKE 2 PUFFS BY MOUTH TWICE A DAY 10.6 Inhaler 5   glucose blood test strip USE TO CHECK BLOOD SUGAR 4 TIMES A DAY. INSULIN-DEPENDENT. DIAGNOSIS: E11.9 100 each 5   glyBURIDE (DIABETA) 5 MG tablet TAKE 2 TABLETS BY MOUTH DAILY WITH BREAKFAST. 180 tablet 1   insulin aspart (NOVOLOG) 100 UNIT/ML injection USE PER SLIDING SCALE--200-250=4-5 UNITS, 251-300=5-6 UNITS,301-350=6-7 UNITS, WITH MEALS 30 mL 3   meclizine (ANTIVERT) 25 MG tablet Take 1 tablet (25 mg total) by mouth daily as needed. For dizziness 30 tablet 6   nitroGLYCERIN (NITROSTAT) 0.4 MG SL tablet Place 1 tablet (0.4 mg total) under the tongue as needed for chest pain. As directed 25 tablet 2   NON FORMULARY Oxygen 2 liters 24/7     nystatin (MYCOSTATIN/NYSTOP) 100000 UNIT/GM POWD Apply up to 3 times a day if needed. 15 g 5   Polyvinyl Alcohol-Povidone (REFRESH OP) Place 1 drop into both eyes daily as needed.     ranolazine (RANEXA) 500 MG 12 hr tablet TAKE 1 TABLET BY MOUTH TWICE A DAY 180 tablet 1   sotalol (BETAPACE) 80  MG tablet TAKE 0.5 TABLETS (40 MG TOTAL) BY MOUTH 2 (TWO) TIMES DAILY. 90 tablet 1   atorvastatin (LIPITOR) 20 MG tablet TAKE 1 TABLET BY MOUTH EVERYDAY AT BEDTIME 90 tablet 3   No facility-administered medications prior to visit.      Allergies:   Actos [pioglitazone], Metformin and related, and Zocor [simvastatin]   Social History   Socioeconomic History   Marital status: Widowed    Spouse name: Not on file   Number of children: 2   Years of education: Not on file   Highest education level: Not on file  Occupational History   Occupation: Retired  Scientist, product/process development strain: Not on file   Food insecurity    Worry: Not on file    Inability: Not on file   Transportation needs    Medical: Not on file    Non-medical: Not on file  Tobacco  Use   Smoking status: Never Smoker   Smokeless tobacco: Never Used  Substance and Sexual Activity   Alcohol use: No    Alcohol/week: 0.0 standard drinks   Drug use: No   Sexual activity: Never  Lifestyle   Physical activity    Days per week: Not on file    Minutes per session: Not on file   Stress: Not on file  Relationships   Social connections    Talks on phone: Not on file    Gets together: Not on file    Attends religious service: Not on file    Active member of club or organization: Not on file    Attends meetings of clubs or organizations: Not on file    Relationship status: Not on file  Other Topics Concern   Not on file  Social History Narrative   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   ROS:   Please see the history of present illness.    ROS All other systems are reviewed and are negative   PHYSICAL EXAM:   VS:  BP (!) 150/75    Pulse 83    Ht 5\' 2"  (1.575 m)    Wt 127 lb 3.2 oz (57.7 kg)    SpO2 99%    BMI 23.27 kg/m      General: Alert, oriented x3, no distress, smiling. Wearing O2 Head: no evidence of trauma, PERRL, EOMI, no exophtalmos or lid lag, no myxedema, no xanthelasma; normal ears, nose and oropharynx Neck: normal jugular venous pulsations and no hepatojugular reflux; brisk carotid pulses without delay and no carotid bruits Chest: clear to auscultation, no signs of consolidation by percussion or palpation, normal fremitus, symmetrical and full respiratory excursions Cardiovascular: normal position and quality of the apical impulse, regular rhythm, normal first and second heart sounds, 1/6 apical holosystolic murmur, no diastolic murmurs, rubs or gallops Abdomen: no tenderness or distention, no masses by palpation, no abnormal pulsatility or arterial bruits, normal bowel sounds, no hepatosplenomegaly Extremities: no clubbing, cyanosis or edema; 2+ radial, ulnar and brachial pulses bilaterally; 2+ right femoral, posterior tibial and dorsalis pedis pulses; 2+ left femoral, posterior tibial and dorsalis pedis pulses; no subclavian or femoral bruits Neurological: grossly nonfocal Psych: Normal mood and affect    Wt Readings from Last 3 Encounters:  05/18/19 127 lb 3.2 oz (57.7 kg)  04/24/19 126 lb 8 oz (57.4 kg)  04/13/19 127 lb 6.4 oz (57.8 kg)     Studies/Labs Reviewed:   EKG:  EKG is ordered today.  The ekg ordered today shows sinus rhythm, QS in V1-V2 (old), normal repolarization, QTc 433 ms. Recent Labs: 09/07/2018: ALT 9; BUN 23; Creatinine, Ser 1.14; Potassium 5.0; Sodium 139; TSH 2.96 04/24/2019: Hemoglobin 12.0; Platelets 251.0   Lipid Panel      Component Value Date/Time   CHOL 163 09/07/2018 1210   CHOL 154 07/12/2017 0932   TRIG 103.0 09/07/2018 1210   HDL 52.50 09/07/2018  1210   HDL 47 07/12/2017 0932   CHOLHDL 3 09/07/2018 1210   VLDL 20.6 09/07/2018 1210   LDLCALC 90 09/07/2018 1210   LDLCALC 91 07/12/2017 0932   LDLDIRECT 87.3 11/21/2008 1446    ASSESSMENT:    1. Chronic diastolic heart failure ([place])   2. Essential hypertension   3. Hypercholesterolemia      PLAN:  In order of problems listed above:  1. CHF: As far as I can tell she is euvolemic.  She is currently not taking any diuretics.  Limited by unsteady gait and respiratory problems.  Overall NYHA functional class III. 2. CAD: controlled on 2(3) antianginals: amlodipine and ranolazine (and sotalol). She is essentially dependent on the LIMA to LAD bypass graft, with occlusion of all 3 vein grafts as well as the native oblique marginal and native right coronary artery. There were no good options for either percutaneous or surgical revascularization when she last had a heart catheterization in 2012. 3. AFib: No clinical episodes on sotalol + ranolazine.  QTc is not prolonged.  Unable to take anticoagulation due to history of severe retroperitoneal bleeding when on aspirin/clopidogrel combination. She has also had at least one serious fall, but no falls recently. CHADSVasc 7 (age 47, gender, diabetes, hypertension, heart failure, vascular disease).  Fortunately she has not had stroke/TIA or any embolic events. 4. Sotalol: despite adding ranolazine, QT is in normal range. 5. HLP: LDL not at target. She had to stop Zetia since she was unable to afford it. Increase atorvastatin. 6. DM: A1c peaked at 8.6% in April, improved to 8%. 7. PAD: Asymptomatic (sedentary).   8. HTN: Acceptable range (tendency to falls at lower BP).  Medication Adjustments/Labs and Tests Ordered: Current medicines are reviewed at length with the patient today.  Concerns regarding medicines are  outlined above.  Medication changes, Labs and Tests ordered today are listed in the Patient Instructions below. Patient Instructions  Medication Instructions:  INCREASE the Atorvastatin to 80 mg once daily  If you need a refill on your cardiac medications before your next appointment, please call your pharmacy.   Lab work: Your provider would like for you to return in 3-6 months to have the following labs drawn: Fasting Lipid. You do not need an appointment for the lab. Once in our office lobby there is a podium where you can sign in and ring the doorbell to alert [place] that you are here. The lab is open from 8:00 am to 4:30 pm; closed for lunch from 12:45pm-1:45pm.  If you have labs (blood work) drawn today and your tests are completely normal, you will receive your results only by: [place] (if you have MyChart) OR A paper copy in the mail If you have any lab test that is abnormal or we need to change your treatment, we will call you to review the results.  Testing/Procedures: None ordered  Follow-Up: At [facility], you and your health needs are our priority.  As part of our continuing mission to provide you with exceptional heart care, we have created designated Provider Care Teams.  These Care Teams include your primary Cardiologist (physician) and Advanced Practice Providers (APPs -  Physician Assistants and Nurse Practitioners) who all work together to provide you with the care you need, when you need it. You will need a follow up appointment in 6 months.  Please call our office 2 months in advance to schedule this appointment.  You may see Sanda Klein, MD or one of the  following Advanced Practice Providers on your designated Care Team: Almyra Deforest, PA-C Fabian Sharp, PA-C            Signed, Sanda Klein, MD  05/21/2019 9:30 AM    [facility] [address], [place], [place]  [address] Phone: [phone]; Fax: [phone]

## 2019-05-27 ENCOUNTER — Other Ambulatory Visit: Payer: Self-pay | Admitting: Family Medicine

## 2019-06-22 DIAGNOSIS — Z23 Encounter for immunization: Secondary | ICD-10-CM | POA: Diagnosis not present

## 2019-06-23 ENCOUNTER — Other Ambulatory Visit: Payer: Self-pay | Admitting: Family Medicine

## 2019-07-11 ENCOUNTER — Other Ambulatory Visit: Payer: Self-pay | Admitting: Pulmonary Disease

## 2019-08-11 ENCOUNTER — Other Ambulatory Visit: Payer: Self-pay | Admitting: Cardiovascular Disease

## 2019-08-16 DIAGNOSIS — E119 Type 2 diabetes mellitus without complications: Secondary | ICD-10-CM | POA: Diagnosis not present

## 2019-08-16 LAB — HM DIABETES EYE EXAM

## 2019-08-21 ENCOUNTER — Other Ambulatory Visit: Payer: Self-pay | Admitting: Cardiovascular Disease

## 2019-08-24 NOTE — Telephone Encounter (Signed)
Rx(s) sent to pharmacy electronically.  

## 2019-08-25 ENCOUNTER — Other Ambulatory Visit: Payer: Self-pay | Admitting: Cardiovascular Disease

## 2019-09-12 ENCOUNTER — Other Ambulatory Visit: Payer: Medicare Other

## 2019-09-12 ENCOUNTER — Other Ambulatory Visit: Payer: Self-pay

## 2019-09-12 ENCOUNTER — Ambulatory Visit: Payer: Medicare Other

## 2019-09-12 ENCOUNTER — Encounter: Payer: Self-pay | Admitting: Family Medicine

## 2019-09-12 ENCOUNTER — Ambulatory Visit (INDEPENDENT_AMBULATORY_CARE_PROVIDER_SITE_OTHER): Payer: Medicare Other | Admitting: Family Medicine

## 2019-09-12 VITALS — BP 142/60 | HR 72 | Temp 97.6°F | Ht 62.0 in | Wt 123.4 lb

## 2019-09-12 DIAGNOSIS — E1159 Type 2 diabetes mellitus with other circulatory complications: Secondary | ICD-10-CM

## 2019-09-12 DIAGNOSIS — I25708 Atherosclerosis of coronary artery bypass graft(s), unspecified, with other forms of angina pectoris: Secondary | ICD-10-CM

## 2019-09-12 DIAGNOSIS — K862 Cyst of pancreas: Secondary | ICD-10-CM

## 2019-09-12 DIAGNOSIS — R0902 Hypoxemia: Secondary | ICD-10-CM | POA: Diagnosis not present

## 2019-09-12 DIAGNOSIS — I10 Essential (primary) hypertension: Secondary | ICD-10-CM

## 2019-09-12 DIAGNOSIS — E7800 Pure hypercholesterolemia, unspecified: Secondary | ICD-10-CM

## 2019-09-12 DIAGNOSIS — Z7189 Other specified counseling: Secondary | ICD-10-CM

## 2019-09-12 DIAGNOSIS — Z0000 Encounter for general adult medical examination without abnormal findings: Secondary | ICD-10-CM | POA: Diagnosis not present

## 2019-09-12 DIAGNOSIS — R7989 Other specified abnormal findings of blood chemistry: Secondary | ICD-10-CM | POA: Diagnosis not present

## 2019-09-12 LAB — CBC WITH DIFFERENTIAL/PLATELET
Basophils Absolute: 0.1 10*3/uL (ref 0.0–0.1)
Basophils Relative: 0.6 % (ref 0.0–3.0)
Eosinophils Absolute: 0.1 10*3/uL (ref 0.0–0.7)
Eosinophils Relative: 1.1 % (ref 0.0–5.0)
HCT: 36.6 % (ref 36.0–46.0)
Hemoglobin: 11.9 g/dL — ABNORMAL LOW (ref 12.0–15.0)
Lymphocytes Relative: 18 % (ref 12.0–46.0)
Lymphs Abs: 1.6 10*3/uL (ref 0.7–4.0)
MCHC: 32.5 g/dL (ref 30.0–36.0)
MCV: 90.8 fl (ref 78.0–100.0)
Monocytes Absolute: 2.4 10*3/uL — ABNORMAL HIGH (ref 0.1–1.0)
Monocytes Relative: 27 % — ABNORMAL HIGH (ref 3.0–12.0)
Neutro Abs: 4.7 10*3/uL (ref 1.4–7.7)
Neutrophils Relative %: 53.3 % (ref 43.0–77.0)
Platelets: 265 10*3/uL (ref 150.0–400.0)
RBC: 4.03 Mil/uL (ref 3.87–5.11)
RDW: 15.5 % (ref 11.5–15.5)
WBC: 8.8 10*3/uL (ref 4.0–10.5)

## 2019-09-12 LAB — LIPID PANEL
Cholesterol: 164 mg/dL (ref 0–200)
HDL: 50.3 mg/dL (ref >39.00)
LDL Cholesterol: 91 mg/dL (ref 0–99)
NonHDL: 113.42
Total CHOL/HDL Ratio: 3
Triglycerides: 111 mg/dL (ref 0.0–149.0)
VLDL: 22.2 mg/dL (ref 0.0–40.0)

## 2019-09-12 LAB — HEMOGLOBIN A1C: Hgb A1c MFr Bld: 8.3 % — ABNORMAL HIGH (ref 4.6–6.5)

## 2019-09-12 LAB — COMPREHENSIVE METABOLIC PANEL
ALT: 9 U/L (ref 0–35)
AST: 16 U/L (ref 0–37)
Albumin: 4.2 g/dL (ref 3.5–5.2)
Alkaline Phosphatase: 64 U/L (ref 39–117)
BUN: 28 mg/dL — ABNORMAL HIGH (ref 6–23)
CO2: 31 mEq/L (ref 19–32)
Calcium: 9.5 mg/dL (ref 8.4–10.5)
Chloride: 101 mEq/L (ref 96–112)
Creatinine, Ser: 1.22 mg/dL — ABNORMAL HIGH (ref 0.40–1.20)
GFR: 42.18 mL/min — ABNORMAL LOW (ref >60.00)
Glucose, Bld: 178 mg/dL — ABNORMAL HIGH (ref 70–99)
Potassium: 5.1 mEq/L (ref 3.5–5.1)
Sodium: 137 mEq/L (ref 135–145)
Total Bilirubin: 0.6 mg/dL (ref 0.2–1.2)
Total Protein: 6.9 g/dL (ref 6.0–8.3)

## 2019-09-12 LAB — TSH: TSH: 2.51 u[IU]/mL (ref 0.35–4.50)

## 2019-09-12 NOTE — Patient Instructions (Addendum)
Check with your insurance to see if they will cover the shingles shot. Go to the lab on the way out.  We'll contact you with your lab report. Don't change your meds for now.  I'll update Dr. Loletha Grayer about your labs and rash.   Take care.  Glad to see you.  Plan on recheck in about 4 months.  We can do labs at the visit.  You don't have to fast.

## 2019-09-12 NOTE — Progress Notes (Signed)
This visit occurred during the SARS-CoV-2 public health emergency.  Safety protocols were in place, including screening questions prior to the visit, additional usage of staff PPE, and extensive cleaning of exam room while observing appropriate contact time as indicated for disinfecting solutions.   I have personally reviewed the Medicare Annual Wellness questionnaire and have noted 1. The patient's medical and social history 2. Their use of alcohol, tobacco or illicit drugs 3. Their current medications and supplements 4. The patient's functional ability including ADL's, fall risks, home safety risks and hearing or visual             impairment. 5. Diet and physical activities 6. Evidence for depression or mood disorders  The patients weight, height, BMI have been recorded in the chart and visual acuity is per eye clinic.  I have made referrals, counseling and provided education to the patient based review of the above and I have provided the pt with a written personalized care plan for preventive services.  Provider list updated- see scanned forms.  Routine anticipatory guidance given to patient.  See health maintenance. The possibility exists that previously documented standard health maintenance information may have been brought forward from a previous encounter into this note.  If needed, that same information has been updated to reflect the current situation based on today's encounter.    Flu 2020 Shingles discussed with patient PNA up-to-date Tetanus 2017 Colon cancer screening not due given her age. Breast cancer screening declined by patient. DEXA declined by patient. Advance directive-son and daughter are equally designated patient were incapacitated. Cognitive function addressed- see scanned forms- and if abnormal then additional documentation follows.   Previous MRI pancreas d/w pt.  Not due for f/u yet.  Discussed.  No abdominal pain.  No jaundice.  CBC d/w pt. F/u labs  pending.  History of abnormal CBC but no recent fevers or other new symptoms.  Diabetes:  Using medications without difficulties: yes Hypoglycemic episodes:see below.  Hyperglycemic episodes:no Feet problems:no Blood Sugars averaging: min 79, max 420 but that was atypical/rare.  Usually in the AMs she is ~100.   eye exam within last year: yes  Labs pending.   Hypertension:    Using medication without problems or lightheadedness: yes Chest pain with exertion:no Edema:no Short of breath: on O2 at baseline, she is at her baseline usually with some occ variation.  No recent SABA use.  Pulse ox is usually in the mid 90s.    Elevated Cholesterol: Using medications without problems: see below.  Muscle aches: no Diet compliance: encouraged, she has some carb intake, discussed.   Exercise: limited with O2 dependence.  She had an itchy rash noted after inc dose of statin.  Present in the meantime.  No lip or tongue changes.  D/w pt about stopping med for about 1 week to see how she felt vs. updating cardiology.   Rare use of SABA, not used recently.  Still on flovent at baseline. Some occ wheeze occ noted.  Still on O2 at baseline.    PMH and SH reviewed.   Vital signs, Meds and allergies reviewed.  ROS: Per HPI unless specifically indicated in ROS section   GEN: nad, alert and oriented HEENT: ncat, on O2 via [place] at baseline.  NECK: supple w/o LA CV: rrr. PULM: ctab, no inc wob ABD: soft, +bs, colostomy noted. EXT: no edema SKIN: Fine punctate small macules that are all much less than 1 cm noted diffusely and irregularly across the arms.  They blanch.  No ulceration.  Diabetic foot exam: Normal inspection No skin breakdown No calluses  Faint bilateral DP pulses Normal sensation to light tough but mild decrease to monofilament Nails normal  Health Maintenance  Topic Date Due  . OPHTHALMOLOGY EXAM  04/20/2019  . HEMOGLOBIN A1C  03/12/2020  . FOOT EXAM  09/11/2020  .  TETANUS/TDAP  06/16/2026  . INFLUENZA VACCINE  Completed  . DEXA SCAN  Completed  . PNA vac Low Risk Adult  Completed

## 2019-09-14 NOTE — Assessment & Plan Note (Signed)
No change in meds at this point.  See notes on labs.  She agrees.

## 2019-09-14 NOTE — Assessment & Plan Note (Signed)
Continue O2 at baseline.  Continue Flovent at baseline.  Rare albuterol use.

## 2019-09-14 NOTE — Assessment & Plan Note (Signed)
Advance directive-son and daughter are equally designated patient were incapacitated.

## 2019-09-14 NOTE — Assessment & Plan Note (Signed)
  Flu 2020 Shingles discussed with patient PNA up-to-date Tetanus 2017 Colon cancer screening not due given her age. Breast cancer screening declined by patient. DEXA declined by patient. Advance directive-son and daughter are equally designated patient were incapacitated. Cognitive function addressed- see scanned forms- and if abnormal then additional documentation follows.

## 2019-09-14 NOTE — Assessment & Plan Note (Signed)
Previous MRI pancreas d/w pt.  Not due for f/u yet.  Discussed.  No abdominal pain.  No jaundice.

## 2019-09-14 NOTE — Assessment & Plan Note (Signed)
Rare elevated sugar readings.  I want to avoid hypoglycemia.  Cautions discussed with patient.  See notes on labs.  Foot care discussed with patient.

## 2019-09-14 NOTE — Assessment & Plan Note (Signed)
CBC d/w pt. F/u labs pending.  History of abnormal CBC but no recent fevers or other new symptoms.

## 2019-09-14 NOTE — Assessment & Plan Note (Signed)
She had an itchy rash noted after inc dose of statin.   No lip or tongue changes.  D/w pt about stopping med for about 1 week to see how she felt vs. updating cardiology.  I will update cardiology.  We did not yet stop her statin.

## 2019-09-18 ENCOUNTER — Encounter: Payer: Self-pay | Admitting: Family Medicine

## 2019-09-19 ENCOUNTER — Other Ambulatory Visit: Payer: Self-pay | Admitting: Family Medicine

## 2019-09-21 ENCOUNTER — Encounter: Payer: Self-pay | Admitting: Ophthalmology

## 2019-10-13 ENCOUNTER — Other Ambulatory Visit: Payer: Self-pay

## 2019-10-13 ENCOUNTER — Ambulatory Visit (INDEPENDENT_AMBULATORY_CARE_PROVIDER_SITE_OTHER): Payer: Medicare Other | Admitting: Adult Health

## 2019-10-13 ENCOUNTER — Encounter: Payer: Self-pay | Admitting: Adult Health

## 2019-10-13 DIAGNOSIS — M419 Scoliosis, unspecified: Secondary | ICD-10-CM

## 2019-10-13 DIAGNOSIS — J984 Other disorders of lung: Secondary | ICD-10-CM | POA: Diagnosis not present

## 2019-10-13 DIAGNOSIS — J9612 Chronic respiratory failure with hypercapnia: Secondary | ICD-10-CM

## 2019-10-13 DIAGNOSIS — G4733 Obstructive sleep apnea (adult) (pediatric): Secondary | ICD-10-CM | POA: Diagnosis not present

## 2019-10-13 DIAGNOSIS — J9611 Chronic respiratory failure with hypoxia: Secondary | ICD-10-CM | POA: Diagnosis not present

## 2019-10-13 NOTE — Progress Notes (Signed)
Virtual Visit via Telephone Note  I connected with [name] on 10/13/19 at 11:00 AM EST by telephone and verified that I am speaking with the correct person using two identifiers.  Location: Patient: Home  Provider: Home    I discussed the limitations, risks, security and privacy concerns of performing an evaluation and management service by telephone and the availability of in person appointments. I also discussed with the patient that there may be a patient responsible charge related to this service. The patient expressed understanding and agreed to proceed.   History of Present Illness: 85 year old female with known history of restrictive lung disease with kyphoscoliosis and asthma.  She has hypoxic and hypercarbic respiratory failure on on oxygen at 2 L.  She has underlying sleep apnea  on nocturnal CPAP with oxygen  Today's televisit as a 66-month follow-up for obstructive sleep apnea, oxygen dependent respiratory failure, asthma Patient has underlying restrictive lung disease complicated by hypoxic and hypercarbic respiratory failure on oxygen at 2 L.  She is on nocturnal CPAP.  She says she is doing well on CPAP.  She feels that she benefits from CPAP.  She wears it every single night.  CPAP download shows excellent compliance with daily average usage at 8.5 hours.  Patient is on CPAP 12 cm H2O.  AHI 9.7.  Patient has significant mask leak. Patient says she does have some difficulty at nighttime with her blood sugars and when she gets up to go to the bathroom she takes her mask off while he is still running and she wonders if that is causing some of the mask leaks and increased events.  She says she feels rested with no significant daytime sleepiness.  Patient does have some mild asthma.  Is on Flovent .  She denies any increased cough or wheezing.  She is on ACE inhibitor but denies cough  Patient Active Problem List   Diagnosis Date Noted  . Cough 01/13/2018  . Medicare annual  wellness visit, subsequent 09/09/2017  . Renal cyst 09/09/2017  . Sleep related hypoxia 06/02/2017  . Fall at home 05/27/2017  . Abnormal CBC 05/27/2017  . Restrictive lung disease due to kyphoscoliosis 04/14/2017  . Asthma 02/24/2017  . Chronic respiratory failure with hypoxia and hypercapnia ([place]) 02/24/2017  . Pancreatic cyst 02/22/2017  . Tremor 01/20/2017  . Hypoxia 11/20/2016  . Loss of weight 09/16/2016  . Chronic diastolic heart failure ([place]) 11/11/2015  . Neck pain 01/09/2015  . Advance care planning 01/04/2015  . Risk for falls 08/24/2013  . Cardiomyopathy, ischemic 08/24/2013  . Left tibial fracture 05/05/2013  . Coronary artery disease   . Hypercholesterolemia   . Chronic kidney disease, stage III (moderate) 08/12/2012  . Colostomy in place [facility]) 08/12/2012  . Vitamin D deficiency 11/28/2008  . CAROTID BRUIT 05/24/2008  . Iron deficiency anemia 04/20/2007  . Type 2 diabetes mellitus with vascular disease ([place]) 04/19/2007  . HLD (hyperlipidemia) 04/19/2007  . Essential hypertension 04/19/2007  . Atrial fibrillation ([place]) 04/19/2007  . PAD (peripheral artery disease) ([place]) 04/19/2007  . OSTEOPOROSIS 04/19/2007   Current Outpatient Medications on File Prior to Visit  Medication Sig Dispense Refill  . albuterol (PROVENTIL HFA;VENTOLIN HFA) 108 (90 Base) MCG/ACT inhaler Inhale 1-2 puffs into the lungs 3 (three) times daily as needed.    Marland Kitchen amLODipine (NORVASC) 10 MG tablet TAKE ONE TABLET (10 MG) BY MOUTH DAILY. 90 tablet 3  . aspirin EC 81 MG tablet Take 81 mg by mouth daily.    Marland Kitchen  atorvastatin (LIPITOR) 80 MG tablet Take 1 tablet (80 mg total) by mouth daily at 6 PM. 90 tablet 3  . BD INSULIN SYRINGE U/F 31G X 5/16" 0.3 ML MISC USE DAILY AND AS DIRECTED SLIDING SCALE. MULTIPLE INJECTIONS OF INSULIN PER DAY 100 each 5  . benazepril (LOTENSIN) 20 MG tablet Take 1 tablet (20 mg total) by mouth daily. 90 tablet 2  . Cholecalciferol (VITAMIN D) 1000 UNITS capsule Take 1,000  Units by mouth 2 (two) times daily.      Marland Kitchen FLOVENT HFA 44 MCG/ACT inhaler TAKE 2 PUFFS BY MOUTH TWICE A DAY 10.6 Inhaler 5  . glyBURIDE (DIABETA) 5 MG tablet TAKE 2 TABLETS BY MOUTH DAILY WITH BREAKFAST. 180 tablet 1  . insulin aspart (NOVOLOG) 100 UNIT/ML injection USE PER SLIDING SCALE--200-250=4-5 UNITS, 251-300=5-6 UNITS,301-350=6-7 UNITS, WITH MEALS 30 mL 3  . meclizine (ANTIVERT) 25 MG tablet Take 1 tablet (25 mg total) by mouth daily as needed. For dizziness 30 tablet 6  . nitroGLYCERIN (NITROSTAT) 0.4 MG SL tablet Place 1 tablet (0.4 mg total) under the tongue as needed for chest pain. As directed 25 tablet 2  . NON FORMULARY Oxygen 2 liters 24/7    . nystatin (MYCOSTATIN/NYSTOP) 100000 UNIT/GM POWD Apply up to 3 times a day if needed. 15 g 5  . ONETOUCH ULTRA test strip USE TO CHECK BLOOD SUGAR 4 TIMES A DAY. INSULIN-DEPENDENT. DIAGNOSIS: E11.9 100 strip 5  . Polyvinyl Alcohol-Povidone (REFRESH OP) Place 1 drop into both eyes daily as needed.    . ranolazine (RANEXA) 500 MG 12 hr tablet Take 1 tablet (500 mg total) by mouth 2 (two) times daily. TAKE 1 TABLET BY MOUTH TWICE A DAY 180 tablet 2  . sotalol (BETAPACE) 80 MG tablet TAKE 0.5 TABLETS (40 MG TOTAL) BY MOUTH 2 (TWO) TIMES DAILY. 90 tablet 1   No current facility-administered medications on file prior to visit.    Observations/Objective: O2 sat on room air 79% on room air.  O2 sat 95-98% on 2 l/m   Assessment and Plan: Obstructive sleep apnea-patient has excellent compliance.  She does have increased number of events .  She is not very symptomatic.  We will have her change her mask as her mask is old.  And if she does get up at night turn her machine on and off.  We will do a CPAP download in 4 weeks.  If still has significant events.  We will make some changes  Chronic hypoxic respiratory failure with underlying restrictive lung disease.  Appears to be stable.  Continue on oxygen at 2L.  Asthma currently stable on  Flovent  Plan  Patient Instructions  Continue on Flovent 2 puffs Twice daily, rinse after use.  Continue on CPAP At bedtime  With oxygen .  Change your mask .  CPAP download in 4 weeks .  Continue on Oxygen 2lm Follow up with Dr. Halford Chessman  In 1 year and As needed   Please contact office for sooner follow up if symptoms do not improve or worsen or seek emergency care       Follow Up Instructions: Follow-up in 1 year and as needed   I discussed the assessment and treatment plan with the patient. The patient was provided an opportunity to ask questions and all were answered. The patient agreed with the plan and demonstrated an understanding of the instructions.   The patient was advised to call back or seek an in-person evaluation if the symptoms worsen or  if the condition fails to improve as anticipated.  I provided 24 minutes of non-face-to-face time during this encounter.   Rexene Edison, NP

## 2019-10-13 NOTE — Patient Instructions (Signed)
Continue on Flovent 2 puffs Twice daily, rinse after use.  Continue on CPAP At bedtime  With oxygen .  Change your mask .  CPAP download in 4 weeks .  Continue on Oxygen 2lm Follow up with Dr. Halford Chessman  In 1 year and As needed   Please contact office for sooner follow up if symptoms do not improve or worsen or seek emergency care

## 2019-10-14 NOTE — Progress Notes (Signed)
Reviewed and agree with assessment/plan.   Arthor Gorter, MD [facility] 09/30/2016, 12:24 PM Pager:  [phone]  

## 2019-10-16 NOTE — Addendum Note (Signed)
Addended by: Parke Poisson E on: 10/16/2019 02:42 PM   Modules accepted: Orders

## 2019-10-16 NOTE — Addendum Note (Signed)
Addended by: Parke Poisson E on: 10/16/2019 01:27 PM   Modules accepted: Orders

## 2019-10-17 ENCOUNTER — Telehealth: Payer: Self-pay | Admitting: Adult Health

## 2019-10-17 NOTE — Telephone Encounter (Signed)
Hello can you ladies edit the [name] note as they requested for the stat of 79% to be on room air. Im not sure how to correct the [facility] note and re-fax it. Thank you

## 2019-10-17 NOTE — Telephone Encounter (Signed)
This was a virtual visit done on 10/13/19 - the office was closed and [name] did visit without my assistance.  [name], do you recall if the 79% O2 sat was reported by the patient to be at rest or with exertion?  Thanks!

## 2019-10-17 NOTE — Telephone Encounter (Signed)
Jess- before I call [employer], do you recall if the sat on RA was at rest or with exertion:    Observations/Objective: O2 sat on room air 79% on room air.  O2 sat 95-98% on 2 l/m    Please advise thanks!

## 2019-10-17 NOTE — Telephone Encounter (Signed)
At rest on room air .

## 2019-10-18 NOTE — Telephone Encounter (Signed)
I have updated the [facility] note Joellen Jersey

## 2019-10-18 NOTE — Telephone Encounter (Signed)
lmtcb for [name] with [employer].

## 2019-10-19 NOTE — Telephone Encounter (Signed)
From Staff Message:  Clance Boll, Zettie Pho, RN  Tommy Medal.  Daneil Dan (attached) left me a v/m message advising that this patient's updated sats are in the care coordination note now.   Can you pull those please?   Thanks to you both!  Melissa     Will await to hear back from [facility] regarding the sats.

## 2019-10-24 NOTE — Telephone Encounter (Signed)
I think this was in the CMN's that I just gave Dr. Halford Chessman to sign

## 2019-10-24 NOTE — Telephone Encounter (Signed)
Called [employer] and spoke with rep. They received the O2 sat information but have faxed over a CMN on 1.14.21. Unsure if this was received by [place], I had rep re-fax CMN to Anita's attn to our main fax.   Will forward to [place] to look out for.

## 2019-10-26 NOTE — Telephone Encounter (Signed)
Dr. Halford Chessman did sign this CMN on 10/24/2019 and I faxed it to [place] on 10/26/2019. I received confirmation that the fax was received

## 2019-10-27 ENCOUNTER — Telehealth: Payer: Self-pay | Admitting: Pulmonary Disease

## 2019-10-27 NOTE — Telephone Encounter (Signed)
This CMN was faxed yesterday 1/21 and I have now faxed it again today

## 2019-10-27 NOTE — Telephone Encounter (Signed)
Rodena Piety, can you please advise on CMN, thanks

## 2019-11-12 ENCOUNTER — Other Ambulatory Visit: Payer: Self-pay | Admitting: Family Medicine

## 2019-11-23 ENCOUNTER — Ambulatory Visit: Payer: Medicare Other | Admitting: Cardiovascular Disease

## 2019-11-28 NOTE — Progress Notes (Signed)
Cardiology Office Note  Date: 11/29/2019   ID: [name], DOB 1935-04-07, MRN [id number]  PCP:  Tonia Ghent, MD  Cardiologist:  Sanda Klein, MD Electrophysiologist:  None   Chief Complaint  Patient presents with  . Follow-up    CAD, PAF, PAD HTN, chronic diastolic heart failure    History of Present Illness: [name] is a 85 y.o. female with a history of CAD s/p CABG x4 Dr. Darcey Nora 2003,  PAF not on anti-coagulation  2/2 spontaneous retroperitoneal hematoma, chronic sotalol therapy. HLD, HTN, DM II, PAD, CKD II, COPD, OSA on CPAP, 02 dependent, restrictive lung disease d/t kyphosis, Breast Ca.   [name] 2008 SVG to diagonal occluded but good backfill otherwise okay. [place] 2011 PTCA and stent graft to OM/RCA LHC 2012 SVG graft to RCA 100% occluded, to the OM 100% occluded, and diagonal 100% occluded.  Internal mammary artery to LAD widely patent.  She denies any recent acute illnesses, hospitalizations, surgeries, travels.  She presents with no particular complaints today.  She is wearing continuous O2.  States she has some minor lower extremity swelling but nothing significant.  No complaints of progressive anginal or exertional symptoms, palpitations or arrhythmias, orthostatic symptoms, stroke or TIA-like symptoms, bleeding in stool or urine, Claudication-like symptoms, DVT or PE-like symptoms.  At last visit with Dr. Sallyanne Kuster Ranexa had significantly improved her anginal symptoms.  She is not on anticoagulation for history of spontaneous rupture peritoneal bleed and hematoma.  Previous history of falls but denies any recent.  Most recent EF assessment was greater then 60% scintigraphy and left ventricular angiography in 2012, 6065% by echo April 2017.   Past Medical History:  Diagnosis Date  . Anemia, iron deficiency   . Atrial fibrillation ([place]) 11/28/2010   2D Echo - EF-30-35, left atrium moderate to severely dilated, right ventricle moderately dilated, tricuspid  valve mild-moderate regurgitation  . Cancer [facility])    R beast cancer followed by Dr. Hassell Done  . CAP (community acquired pneumonia)   . Chronic kidney disease (CKD), stage II (mild)   . Coronary artery disease   . Diabetes mellitus, type 2 ([place])   . Diverticulitis   . History of breast cancer    Followed yearly by [facility]  . Hyperlipemia   . Hypertension   . Nontraumatic retroperitoneal hematoma   . Osteoporosis   . Peripheral vascular disease [facility])     Past Surgical History:  Procedure Laterality Date  . APPENDECTOMY  1962  . BREAST SURGERY  02/01   Quadrectomy Breast cancer (Dr. Hassell Done)  . CARDIAC CATHETERIZATION  2000   Iliac Stent (Dr. Juanita Craver) yearly follow-up  . CARDIAC CATHETERIZATION  01/25/02   High grade LAD disease, total of 3 vess, involvement  . CARDIAC CATHETERIZATION  01/13/07   SV graft to diag. occluded but good backfill o/w ok  . CARDIAC CATHETERIZATION  02/14/10   PTCA & stent vein graft to OM/RCA ([place])  . CARDIAC CATHETERIZATION  04/17/2011   Saphenous vein grafts to the RCA 100% occluded, to the OM 100% occluded, and the diagonal 100% occluded. Internal mammary artery to the LAD, widely patent.  Marland Kitchen CATARACT EXTRACTION  10/10 and 02/11  . COLOSTOMY    . CORONARY ARTERY BYPASS GRAFT  02/09/02   X 4 ([place])  . DILATION AND CURETTAGE OF UTERUS  1971    miscarrage, abnormal bleeding  . DILATION AND CURETTAGE OF UTERUS  1995   Menorrhagia  . HEMORRHOID SURGERY  09/11/04  Binding Hassell Done)  . PARTIAL COLECTOMY    . SHOULDER ARTHROSCOPY  05/04   Right, with rotator cuff debridement    Current Outpatient Medications  Medication Sig Dispense Refill  . albuterol (PROVENTIL HFA;VENTOLIN HFA) 108 (90 Base) MCG/ACT inhaler Inhale 1-2 puffs into the lungs 3 (three) times daily as needed.    Marland Kitchen amLODipine (NORVASC) 10 MG tablet TAKE ONE TABLET (10 MG) BY MOUTH DAILY. 90 tablet 3  . aspirin EC 81 MG tablet Take 81 mg by mouth daily.    Marland Kitchen atorvastatin (LIPITOR) 80 MG  tablet Take 1 tablet (80 mg total) by mouth daily at 6 PM. 90 tablet 3  . BD INSULIN SYRINGE U/F 31G X 5/16" 0.3 ML MISC USE DAILY AND AS DIRECTED SLIDING SCALE. MULTIPLE INJECTIONS OF INSULIN PER DAY 100 each 5  . benazepril (LOTENSIN) 20 MG tablet Take 1 tablet (20 mg total) by mouth daily. 90 tablet 2  . Cholecalciferol (VITAMIN D) 1000 UNITS capsule Take 1,000 Units by mouth 2 (two) times daily.      Marland Kitchen FLOVENT HFA 44 MCG/ACT inhaler TAKE 2 PUFFS BY MOUTH TWICE A DAY 10.6 Inhaler 5  . glyBURIDE (DIABETA) 5 MG tablet TAKE 2 TABLETS BY MOUTH DAILY WITH BREAKFAST. 180 tablet 1  . insulin aspart (NOVOLOG) 100 UNIT/ML injection USE PER SLIDING SCALE--200-250=4-5 UNITS, 251-300=5-6 UNITS,301-350=6-7 UNITS, WITH MEALS 30 mL 3  . meclizine (ANTIVERT) 25 MG tablet Take 1 tablet (25 mg total) by mouth daily as needed. For dizziness 30 tablet 6  . nitroGLYCERIN (NITROSTAT) 0.4 MG SL tablet Place 1 tablet (0.4 mg total) under the tongue as needed for chest pain. As directed 25 tablet 2  . NON FORMULARY Oxygen 2 liters 24/7    . nystatin (MYCOSTATIN/NYSTOP) 100000 UNIT/GM POWD Apply up to 3 times a day if needed. 15 g 5  . ONETOUCH ULTRA test strip USE TO CHECK BLOOD SUGAR 4 TIMES A DAY. INSULIN-DEPENDENT. DIAGNOSIS: E11.9 100 strip 5  . Polyvinyl Alcohol-Povidone (REFRESH OP) Place 1 drop into both eyes daily as needed.    . ranolazine (RANEXA) 500 MG 12 hr tablet Take 1 tablet (500 mg total) by mouth 2 (two) times daily. TAKE 1 TABLET BY MOUTH TWICE A DAY 180 tablet 2  . sotalol (BETAPACE) 80 MG tablet TAKE 0.5 TABLETS (40 MG TOTAL) BY MOUTH 2 (TWO) TIMES DAILY. 90 tablet 1   No current facility-administered medications for this visit.   Allergies:  Actos [pioglitazone], Metformin and related, and Zocor [simvastatin]   Social History: The patient  reports that she has never smoked. She has never used smokeless tobacco. She reports that she does not drink alcohol or use drugs.   Family History: The  patient's family history includes Breast cancer in her maternal aunt; COPD in her brother; Diabetes in her mother and son; Gout in her brother; Heart disease in her brother, brother, and mother; Heart disease (age of onset: 22) in her father; Hyperlipidemia in her brother; Hypertension in her brother, brother, brother, father, mother, and son; Obesity in her brother.   ROS:  Please see the history of present illness. Otherwise, complete review of systems is positive for none.  All other systems are reviewed and negative.   Physical Exam: VS:  BP 122/64   Pulse 61   Temp (!) 96 F (35.6 C)   Ht 5\' 1"  (1.549 m)   Wt 122 lb (55.3 kg)   SpO2 91%   BMI 23.05 kg/m , BMI Body mass  index is 23.05 kg/m.  Wt Readings from Last 3 Encounters:  11/29/19 122 lb (55.3 kg)  09/12/19 123 lb 6 oz (56 kg)  05/18/19 127 lb 3.2 oz (57.7 kg)    General: Patient appears comfortable at rest. Neck: Supple, no elevated JVP or carotid bruits, no thyromegaly. Lungs: Clear to auscultation, nonlabored breathing at rest. Cardiac: Regular rate and rhythm, no S3 or significant systolic murmur, no pericardial rub. Extremities: No pitting edema, distal pulses 2+. Skin: Warm and dry. Musculoskeletal: No kyphosis. Neuropsychiatric: Alert and oriented x3, affect grossly appropriate.  ECG:  An ECG dated 11/29/2019 was personally reviewed today and demonstrated:  Sinus rhythm rate of 61.  Inferior infarct, age undetermined, anterior infarct age undetermined  Recent Labwork: 09/12/2019: ALT 9; AST 16; BUN 28; Creatinine, Ser 1.22; Hemoglobin 11.9; Platelets 265.0; Potassium 5.1; Sodium 137; TSH 2.51     Component Value Date/Time   CHOL 164 09/12/2019 1235   CHOL 154 07/12/2017 0932   TRIG 111.0 09/12/2019 1235   HDL 50.30 09/12/2019 1235   HDL 47 07/12/2017 0932   CHOLHDL 3 09/12/2019 1235   VLDL 22.2 09/12/2019 1235   LDLCALC 91 09/12/2019 1235   LDLCALC 91 07/12/2017 0932   LDLDIRECT 87.3 11/21/2008 1446     Other Studies Reviewed Today:   Echo 01/31/2016 Study Conclusions   - Left ventricle: The cavity size was normal. Wall thickness was  normal. Systolic function was normal. The estimated ejection  fraction was in the range of 60% to 65%. Wall motion was normal;  there were no regional wall motion abnormalities.   Carotid artery duplex 2001     Assessment and Plan:  1. Chronic diastolic heart failure ([place])   2. Coronary artery disease of bypass graft of native heart with stable angina pectoris (HCC)   3. Paroxysmal atrial fibrillation ([place])   4. Type 2 diabetes mellitus with vascular disease ([place])   5. PAD (peripheral artery disease) ([place])   6. Essential hypertension   7. Mixed hyperlipidemia    1. Chronic diastolic heart failure (HCC) NYHA class III, on continuous O2 for COPD.  Denies any significant weight gain, shortness of breath, or significant lower extremity edema.  He has some minor edema likely due to high-dose amlodipine.  Lungs are CTA anterior fields and posterior upper fields.  She has some very minor crackles in the left bases bilaterally which clear after taking deep breaths.  2. Coronary artery disease of bypass graft of native heart with stable angina pectoris ([place]) CABG x 4 2003. Subsequent LHC's :LHC 2008 SVG to diagonal occluded but good backfill otherwise okay. [place] 2011 PTCA and stent graft to OM/RCA LHC 2012 SVG graft to RCA 100% occluded, to the OM 100% occluded, and diagonal 100% occluded.  Internal mammary artery to LAD widely patent. Continue ASA 81 mg, Ranolazine 500 mg po bid, NTG 0.4 sl prn.  Denies any significant anginal or exertional symptoms but is not very active on a daily basis.  3. Paroxysmal atrial fibrillation (HCC) On sotalol 40 mg po bid. EKG today shows normal sinus rhythm rate of 61.  QT/QTc are 432/434 ms respectively.  Patient voices no episodes of palpitations.  Continue sotalol therapy.  4. Type 2 diabetes mellitus with  vascular disease ([place]) Hgb A1c 10/02/2019 8.3% states she follows Dr. Damita Dunnings for management of her diabetes  5. PAD (peripheral artery disease) (HCC) History of previous iliac stent placement.  She voices no claudication-like symptoms since iliac stent placement.  She is  not very active on a daily basis.  6. Essential hypertension Blood pressure was slightly elevated systolically on arrival with a blood pressure of 138/72.  Recheck in left arm 122/64.  Continue amlodipine 10 mg daily, benazepril 20 mg daily.  7. Mixed hyperlipidemia Recent FLP 2 months ago Tot Chol. 164 , Trig 111, HDL 50, VLDL 22, LDL 91. Continue atorvastatin 80 mg daily   Medication Adjustments/Labs and Tests Ordered: Current medicines are reviewed at length with the patient today.  Concerns regarding medicines are outlined above.     Signed, Levell July, NP 11/29/2019 11:24 AM    [facility]

## 2019-11-29 ENCOUNTER — Ambulatory Visit (INDEPENDENT_AMBULATORY_CARE_PROVIDER_SITE_OTHER): Payer: Medicare Other | Admitting: Physician Assistant

## 2019-11-29 ENCOUNTER — Encounter: Payer: Self-pay | Admitting: Physician Assistant

## 2019-11-29 ENCOUNTER — Other Ambulatory Visit: Payer: Self-pay

## 2019-11-29 VITALS — BP 122/64 | HR 61 | Temp 96.0°F | Ht 61.0 in | Wt 122.0 lb

## 2019-11-29 DIAGNOSIS — I5032 Chronic diastolic (congestive) heart failure: Secondary | ICD-10-CM | POA: Diagnosis not present

## 2019-11-29 DIAGNOSIS — E782 Mixed hyperlipidemia: Secondary | ICD-10-CM | POA: Diagnosis not present

## 2019-11-29 DIAGNOSIS — I480 Paroxysmal atrial fibrillation: Secondary | ICD-10-CM | POA: Diagnosis not present

## 2019-11-29 DIAGNOSIS — E1159 Type 2 diabetes mellitus with other circulatory complications: Secondary | ICD-10-CM

## 2019-11-29 DIAGNOSIS — I739 Peripheral vascular disease, unspecified: Secondary | ICD-10-CM | POA: Diagnosis not present

## 2019-11-29 DIAGNOSIS — I25708 Atherosclerosis of coronary artery bypass graft(s), unspecified, with other forms of angina pectoris: Secondary | ICD-10-CM | POA: Diagnosis not present

## 2019-11-29 DIAGNOSIS — I10 Essential (primary) hypertension: Secondary | ICD-10-CM

## 2019-11-29 NOTE — Patient Instructions (Signed)
Medication Instructions:  Your physician recommends that you continue on your current medications as directed. Please refer to the Current Medication list given to you today.  *If you need a refill on your cardiac medications before your next appointment, please call your pharmacy*  Follow-Up: At [facility], you and your health needs are our priority.  As part of our continuing mission to provide you with exceptional heart care, we have created designated Provider Care Teams.  These Care Teams include your primary Cardiologist (physician) and Advanced Practice Providers (APPs -  Physician Assistants and Nurse Practitioners) who all work together to provide you with the care you need, when you need it.  Your next appointment:   6 month(s)  The format for your next appointment:   In Person  Provider:   Sanda Klein, MD  Other Instructions

## 2019-12-08 ENCOUNTER — Other Ambulatory Visit: Payer: Self-pay | Admitting: Family Medicine

## 2019-12-14 ENCOUNTER — Other Ambulatory Visit: Payer: Self-pay | Admitting: Family Medicine

## 2019-12-28 ENCOUNTER — Other Ambulatory Visit: Payer: Self-pay | Admitting: Pulmonary Disease

## 2020-01-11 ENCOUNTER — Ambulatory Visit (INDEPENDENT_AMBULATORY_CARE_PROVIDER_SITE_OTHER): Payer: Medicare Other | Admitting: Family Medicine

## 2020-01-11 ENCOUNTER — Encounter: Payer: Self-pay | Admitting: Family Medicine

## 2020-01-11 ENCOUNTER — Other Ambulatory Visit: Payer: Self-pay

## 2020-01-11 VITALS — BP 144/64 | HR 66 | Temp 96.7°F | Ht 61.0 in | Wt 119.1 lb

## 2020-01-11 DIAGNOSIS — I25708 Atherosclerosis of coronary artery bypass graft(s), unspecified, with other forms of angina pectoris: Secondary | ICD-10-CM

## 2020-01-11 DIAGNOSIS — B370 Candidal stomatitis: Secondary | ICD-10-CM

## 2020-01-11 DIAGNOSIS — E119 Type 2 diabetes mellitus without complications: Secondary | ICD-10-CM | POA: Diagnosis not present

## 2020-01-11 DIAGNOSIS — R7989 Other specified abnormal findings of blood chemistry: Secondary | ICD-10-CM

## 2020-01-11 DIAGNOSIS — E1159 Type 2 diabetes mellitus with other circulatory complications: Secondary | ICD-10-CM

## 2020-01-11 LAB — POCT GLYCOSYLATED HEMOGLOBIN (HGB A1C): Hemoglobin A1C: 7.3 % — AB (ref 4.0–5.6)

## 2020-01-11 MED ORDER — NYSTATIN 100000 UNIT/ML MT SUSP: 5.0000 mL | Freq: Four times a day (QID) | OROMUCOSAL | 1 refills | Status: DC

## 2020-01-11 NOTE — Progress Notes (Signed)
This visit occurred during the SARS-CoV-2 public health emergency.  Safety protocols were in place, including screening questions prior to the visit, additional usage of staff PPE, and extensive cleaning of exam room while observing appropriate contact time as indicated for disinfecting solutions.  Diabetes:  Using medications without difficulties: rare lows, d/w pt.   Glyburide + SSI, with usually not taking SSI at night to prevent lows.  Hypoglycemic episodes: see above Hyperglycemic episodes: occ highs.   Feet problems: no Blood Sugars averaging: ~200s, occ lower.  Labs discussed with patient.  She had Covid vaccine done.  A1c 7.3.  D/w pt at OV.  She has lower weight noted, with dec in appetite noted.  She has adequate meals at home.  She had gradual weight loss noted.  No FCNAVD.  No blood in stool.  She is less active now at baseline.  She doesn't feel unwell or have night sweats.  She is putting up with colostomy/hernia at baseline.   She has fall alert button and is using O2 at baseline.  She has some sputum, prior to bed at night.    She is white lesions in mouth.  Prev treated for thrush but not recently.  Not painful.    Meds, vitals, and allergies reviewed.  ROS: Per HPI unless specifically indicated in ROS section   GEN: nad, alert and oriented HEENT: mucous membranes moist, she has some small whitish spots in the posterior oropharynx bilaterally they could be small amount of thrush.  No ulceration. NECK: supple w/o LA CV: rrr. PULM: ctab, no inc wob ABD: soft, +bs EXT: 1+ BLE edema and compression stockings SKIN: no acute rash

## 2020-01-11 NOTE — Patient Instructions (Signed)
Restart nystatin and update me as needed.  Plan on recheck in 3 months with labs at the visit.  Take care.  Glad to see you.

## 2020-01-14 DIAGNOSIS — B370 Candidal stomatitis: Secondary | ICD-10-CM | POA: Insufficient documentation

## 2020-01-14 NOTE — Assessment & Plan Note (Signed)
A1c improved, goal to avoid hypoglycemia.  See above.  Recheck periodically.  She agrees.

## 2020-01-14 NOTE — Assessment & Plan Note (Signed)
Can restart nystatin and update me as needed.  She agrees.

## 2020-01-16 ENCOUNTER — Telehealth: Payer: Self-pay | Admitting: Family Medicine

## 2020-01-16 NOTE — Telephone Encounter (Signed)
Patient called today to give you an update on how she is doing  Patient stated that she is improving and that she had to cut back the nystatin to 3 times a day instead of 4 times. She said that after she takes the 2nd dose of the day she notices that she has an elevated heart rate.

## 2020-01-17 NOTE — Telephone Encounter (Signed)
Advised pt of PCP msg. She reports she thinks she will take it two times daily and she will f/u before she gets a refill. Pt verbalized understanding.

## 2020-01-17 NOTE — Telephone Encounter (Signed)
Noted.  Glad she is improving.  She can cut back to 1 time a day if she tolerates that better.  Please update me as needed.  Thanks.

## 2020-01-30 ENCOUNTER — Telehealth: Payer: Self-pay | Admitting: Family Medicine

## 2020-01-30 NOTE — Telephone Encounter (Signed)
Pt called to give an update.  Pt states that she completed the Nystatin and everything seems to have cleared up.   Nothing further needed.

## 2020-01-30 NOTE — Telephone Encounter (Signed)
Noted. Thanks.

## 2020-02-05 ENCOUNTER — Telehealth: Payer: Self-pay | Admitting: Pulmonary Disease

## 2020-02-05 ENCOUNTER — Telehealth: Payer: Self-pay | Admitting: Cardiovascular Disease

## 2020-02-05 NOTE — Telephone Encounter (Signed)
[name] patient x2 - line busy.  Patient last office visit 10/26/19 televisit w/ [name] NP - would recommend virtual visit to discuss.

## 2020-02-05 NOTE — Telephone Encounter (Signed)
Pt c/o Shortness Of Breath: STAT if SOB developed within the last 24 hours or pt is noticeably SOB on the phone  1. Are you currently SOB (can you hear that pt is SOB on the phone)? No  2. How long have you been experiencing SOB? 1 week  3. Are you SOB when sitting or when up moving around? When up and moving around  4. Are you currently experiencing any other symptoms? Swelling   Pt c/o swelling: STAT is pt has developed SOB within 24 hours  1) How much weight have you gained and in what time span? No weight gain  2) If swelling, where is the swelling located? Leg swelling  3) Are you currently taking a fluid pill? No  4) Are you currently SOB? Np  5) Do you have a log of your daily weights (if so, list)? No  6) Have you gained 3 pounds in a day or 5 pounds in a week? No  7) Have you traveled recently? No

## 2020-02-05 NOTE — Telephone Encounter (Signed)
Spoke with patient and she has been having increased shortness of breath with exertion and O2 Sats dropping. Patient has call into pulmonary as well. Weight is stable at 115-118. Lower extremity edema that goes down some at night. Discussed with Janan Ridge PA and he will see patient tomorrow.  Advised patient of visit, follow up with pulmonary, and go to ED if worse. Patient verbalized understanding

## 2020-02-06 ENCOUNTER — Encounter: Payer: Self-pay | Admitting: Physician Assistant

## 2020-02-06 ENCOUNTER — Ambulatory Visit
Admission: RE | Admit: 2020-02-06 | Discharge: 2020-02-06 | Disposition: A | Discharge status: Home / Self Care | Payer: Medicare Other | Source: Ambulatory Visit | Attending: Physician Assistant | Admitting: Physician Assistant

## 2020-02-06 ENCOUNTER — Ambulatory Visit (INDEPENDENT_AMBULATORY_CARE_PROVIDER_SITE_OTHER): Payer: Medicare Other | Admitting: Physician Assistant

## 2020-02-06 ENCOUNTER — Other Ambulatory Visit: Payer: Self-pay

## 2020-02-06 VITALS — BP 140/62 | HR 78 | Temp 98.3°F | Ht 62.0 in | Wt 118.0 lb

## 2020-02-06 DIAGNOSIS — R0600 Dyspnea, unspecified: Secondary | ICD-10-CM

## 2020-02-06 DIAGNOSIS — E119 Type 2 diabetes mellitus without complications: Secondary | ICD-10-CM

## 2020-02-06 DIAGNOSIS — E785 Hyperlipidemia, unspecified: Secondary | ICD-10-CM

## 2020-02-06 DIAGNOSIS — I480 Paroxysmal atrial fibrillation: Secondary | ICD-10-CM

## 2020-02-06 DIAGNOSIS — R0602 Shortness of breath: Secondary | ICD-10-CM | POA: Diagnosis not present

## 2020-02-06 DIAGNOSIS — I25810 Atherosclerosis of coronary artery bypass graft(s) without angina pectoris: Secondary | ICD-10-CM | POA: Diagnosis not present

## 2020-02-06 DIAGNOSIS — I10 Essential (primary) hypertension: Secondary | ICD-10-CM | POA: Diagnosis not present

## 2020-02-06 DIAGNOSIS — J449 Chronic obstructive pulmonary disease, unspecified: Secondary | ICD-10-CM

## 2020-02-06 NOTE — Telephone Encounter (Signed)
Home number still busy  Called mobile number- NA and no option to leave msg

## 2020-02-06 NOTE — Progress Notes (Signed)
Cardiology Office Note:    Date:  02/08/2020   ID:  [name], DOB 12/06/1934, MRN [id number]  PCP:  Tonia Ghent, MD  Cardiologist:  Sanda Klein, MD  Electrophysiologist:  None   Referring MD: Tonia Ghent, MD   Chief Complaint  Patient presents with  . Follow-up    seen for Dr. Sallyanne Kuster, dyspnea    History of Present Illness:    [name] is a 85 y.o. female with a hx of CAD s/p CABG 2003, PAF on sotalol not on anticoagulation due to h/o spontaneous retroperitoneal hematoma, HTN, HLD, DM II, PAD, CKD stage II and COPD on chronic O2. Last Myoview obtained in May 2011 showed EF 64%, significant ischemia in the basal inferoseptal lateral, basal anterolateral, mid inferolateral, mid anterolateral and apical location consistent with mild to moderate ischemia in the mid anterior and apical anterior region. She subsequently underwent SVG to RCA. Cardiac catheterization obtained in March 2012 showed 100% occluded SVG to diagonal, occluded SVG to RCA, patent LIMA to mid LAD, 90% stenosis in SVG to OM that was treated via stent. Repeat cardiac catheterization in July 2012 revealed occlusion of all 3 venous grafts, widely patent LIMA to LAD, 70% distal LAD disease. Ranexa is controlling his chest pain quite well and also decrease his A. fib burden as well. Patient was most recently seen by Levell July, NP on 11/29/2019.  Patient presents today for cardiology office visit in regard to her shortness of breath which has been ongoing for the past week.  She denies any fever or chill.  She does not have any cough.  On physical exam she has no lower extremity edema and I did not appreciate any crackles on exam either.  She has no sign of volume overload.  I suspect she likely has either COPD exacerbation or pneumonia.  I recommend a CBC, basic metabolic panel and a proBNP along with chest x-ray.  She will need to see her pulmonologist as soon as possible.  I also talked with her son regarding  her current course and I have very low threshold to refer her to the emergency room.  On arrival, her O2 saturation was in the 80s, however as soon as she sat down and rested, O2 saturation does come up to 95%.  She is essentially on 24/7 off oxygen.   Past Medical History:  Diagnosis Date  . Anemia, iron deficiency   . Atrial fibrillation ([place]) 11/28/2010   2D Echo - EF-30-35, left atrium moderate to severely dilated, right ventricle moderately dilated, tricuspid valve mild-moderate regurgitation  . Cancer [facility])    R beast cancer followed by Dr. Hassell Done  . CAP (community acquired pneumonia)   . Chronic kidney disease (CKD), stage II (mild)   . Coronary artery disease   . Diabetes mellitus, type 2 ([place])   . Diverticulitis   . History of breast cancer    Followed yearly by [facility]  . Hyperlipemia   . Hypertension   . Nontraumatic retroperitoneal hematoma   . Osteoporosis   . Peripheral vascular disease [facility])     Past Surgical History:  Procedure Laterality Date  . APPENDECTOMY  1962  . BREAST SURGERY  02/01   Quadrectomy Breast cancer (Dr. Hassell Done)  . CARDIAC CATHETERIZATION  2000   Iliac Stent (Dr. Juanita Craver) yearly follow-up  . CARDIAC CATHETERIZATION  01/25/02   High grade LAD disease, total of 3 vess, involvement  . CARDIAC CATHETERIZATION  01/13/07   SV graft  to diag. occluded but good backfill o/w ok  . CARDIAC CATHETERIZATION  02/14/10   PTCA & stent vein graft to OM/RCA ([place])  . CARDIAC CATHETERIZATION  04/17/2011   Saphenous vein grafts to the RCA 100% occluded, to the OM 100% occluded, and the diagonal 100% occluded. Internal mammary artery to the LAD, widely patent.  Marland Kitchen CATARACT EXTRACTION  10/10 and 02/11  . COLOSTOMY    . CORONARY ARTERY BYPASS GRAFT  02/09/02   X 4 ([place])  . DILATION AND CURETTAGE OF UTERUS  1971    miscarrage, abnormal bleeding  . DILATION AND CURETTAGE OF UTERUS  1995   Menorrhagia  . HEMORRHOID SURGERY  09/11/04   Binding Hassell Done)  . PARTIAL  COLECTOMY    . SHOULDER ARTHROSCOPY  05/04   Right, with rotator cuff debridement    Current Medications: Current Meds  Medication Sig  . amLODipine (NORVASC) 10 MG tablet TAKE ONE TABLET (10 MG) BY MOUTH DAILY.  Marland Kitchen aspirin EC 81 MG tablet Take 81 mg by mouth daily.  Marland Kitchen atorvastatin (LIPITOR) 80 MG tablet Take 1 tablet (80 mg total) by mouth daily at 6 PM.  . BD INSULIN SYRINGE U/F 31G X 5/16" 0.3 ML MISC USE DAILY AND AS DIRECTED SLIDING SCALE. MULTIPLE INJECTIONS OF INSULIN PER DAY  . benazepril (LOTENSIN) 20 MG tablet Take 1 tablet (20 mg total) by mouth daily.  . Cholecalciferol (VITAMIN D) 1000 UNITS capsule Take 1,000 Units by mouth 2 (two) times daily.    Marland Kitchen FLOVENT HFA 44 MCG/ACT inhaler TAKE 2 PUFFS BY MOUTH TWICE A DAY  . glyBURIDE (DIABETA) 5 MG tablet TAKE 2 TABLETS BY MOUTH DAILY WITH BREAKFAST.  Marland Kitchen insulin aspart (NOVOLOG) 100 UNIT/ML injection USE PER SLIDING SCALE--200-250=4-5 UNITS, 251-300=5-6 UNITS,301-350=6-7 UNITS, WITH MEALS  . meclizine (ANTIVERT) 25 MG tablet Take 1 tablet (25 mg total) by mouth daily as needed. For dizziness  . nitroGLYCERIN (NITROSTAT) 0.4 MG SL tablet Place 1 tablet (0.4 mg total) under the tongue as needed for chest pain. As directed  . NON FORMULARY Oxygen 2 liters 24/7  . nystatin (MYCOSTATIN) 100000 UNIT/ML suspension Take 5 mLs (500,000 Units total) by mouth 4 (four) times daily. Swish and swallow  . nystatin (MYCOSTATIN/NYSTOP) 100000 UNIT/GM POWD Apply up to 3 times a day if needed.  Glory Rosebush ULTRA test strip USE TO CHECK BLOOD SUGAR 4 TIMES A DAY. INSULIN-DEPENDENT. DIAGNOSIS: E11.9  . Polyvinyl Alcohol-Povidone (REFRESH OP) Place 1 drop into both eyes daily as needed.  . ranolazine (RANEXA) 500 MG 12 hr tablet Take 1 tablet (500 mg total) by mouth 2 (two) times daily. TAKE 1 TABLET BY MOUTH TWICE A DAY  . sotalol (BETAPACE) 80 MG tablet TAKE 0.5 TABLETS (40 MG TOTAL) BY MOUTH 2 (TWO) TIMES DAILY.     Allergies:   Actos [pioglitazone],  Metformin and related, and Zocor [simvastatin]   Social History   Socioeconomic History  . Marital status: Widowed    Spouse name: Not on file  . Number of children: 2  . Years of education: Not on file  . Highest education level: Not on file  Occupational History  . Occupation: Retired  Tobacco Use  . Smoking status: Never Smoker  . Smokeless tobacco: Never Used  Substance and Sexual Activity  . Alcohol use: No    Alcohol/week: 0.0 standard drinks  . Drug use: No  . Sexual activity: Never  Other Topics Concern  . Not on file  Social History Narrative  Retired 1991 from [employer] work   Widowed as of 04/2011 after 50+ years   Lives alone   2 kids in [place]   Enjoys travelling and reading   Social Determinants of Radio broadcast assistant Strain:   . Difficulty of Paying Living Expenses:   Food Insecurity:   . Worried About Charity fundraiser in the Last Year:   . Arboriculturist in the Last Year:   Transportation Needs:   . Film/video editor (Medical):   Marland Kitchen Lack of Transportation (Non-Medical):   Physical Activity:   . Days of Exercise per Week:   . Minutes of Exercise per Session:   Stress:   . Feeling of Stress :   Social Connections:   . Frequency of Communication with Friends and Family:   . Frequency of Social Gatherings with Friends and Family:   . Attends Religious Services:   . Active Member of Clubs or Organizations:   . Attends Archivist Meetings:   Marland Kitchen Marital Status:      Family History: The patient's family history includes Breast cancer in her maternal aunt; COPD in her brother; Diabetes in her mother and son; Gout in her brother; Heart disease in her brother, brother, and mother; Heart disease (age of onset: 3) in her father; Hyperlipidemia in her brother; Hypertension in her brother, brother, brother, father, mother, and son; Obesity in her brother. There is no history of Colon cancer.  ROS:   Please see the history  of present illness.     All other systems reviewed and are negative.  EKGs/Labs/Other Studies Reviewed:    The following studies were reviewed today:  Echo 01/31/2016 LV EF: 60% -  65%   -------------------------------------------------------------------  Indications:   (I50.32). (R06.00).   -------------------------------------------------------------------  History:  PMH: Acquired from the patient and from the patient&'s  chart. Atrial fibrillation. Coronary artery disease. Congestive  heart failure. Congestive heart failure. The patient has a breast  malignancy and is undergoingchemotherapy. Risk factors:  Hypertension. Diabetes mellitus. Dyslipidemia.   -------------------------------------------------------------------  Study Conclusions   - Left ventricle: The cavity size was normal. Wall thickness was  normal. Systolic function was normal. The estimated ejection  fraction was in the range of 60% to 65%. Wall motion was normal;  there were no regional wall motion abnormalities.   EKG:  EKG is ordered today.  The ekg ordered today demonstrates normal sinus rhythm, no significant ST-T wave changes  Recent Labs: 09/12/2019: ALT 9; TSH 2.51 02/06/2020: BUN 20; Creatinine, Ser 1.07; Hemoglobin 11.1; NT-Pro BNP 985; Platelets 265; Potassium 5.5; Sodium 143  Recent Lipid Panel    Component Value Date/Time   CHOL 164 09/12/2019 1235   CHOL 154 07/12/2017 0932   TRIG 111.0 09/12/2019 1235   HDL 50.30 09/12/2019 1235   HDL 47 07/12/2017 0932   CHOLHDL 3 09/12/2019 1235   VLDL 22.2 09/12/2019 1235   LDLCALC 91 09/12/2019 1235   LDLCALC 91 07/12/2017 0932   LDLDIRECT 87.3 11/21/2008 1446    Physical Exam:    VS:  BP 140/62   Pulse 78   Temp 98.3 F (36.8 C)   Ht 5\' 2"  (1.575 m)   Wt 118 lb (53.5 kg)   SpO2 (!) 84%   BMI 21.58 kg/m     Wt Readings from Last 3 Encounters:  02/06/20 118 lb (53.5 kg)  01/11/20 119 lb 1 oz (54 kg)  11/29/19 122 lb  (55.3 kg)  GEN: Frail, on portable O2 HEENT: Normal NECK: No JVD; No carotid bruits LYMPHATICS: No lymphadenopathy CARDIAC: RRR, no murmurs, rubs, gallops RESPIRATORY: Markedly diminished breath sound bilaterally without obvious crackles. ABDOMEN: Soft, non-tender, non-distended MUSCULOSKELETAL:  No edema; No deformity  SKIN: Warm and dry NEUROLOGIC:  Alert and oriented x 3 PSYCHIATRIC:  Normal affect   ASSESSMENT:    1. Dyspnea, unspecified type   2. Coronary artery disease involving coronary bypass graft of native heart without angina pectoris   3. PAF (paroxysmal atrial fibrillation) ([place])   4. Essential hypertension   5. Hyperlipidemia LDL goal <70   6. Controlled type 2 diabetes mellitus without complication, without long-term current use of insulin ([place])   7. Chronic obstructive pulmonary disease, unspecified COPD type ([place])    PLAN:    In order of problems listed above:  1. Dyspnea: She is scheduled a cardiology visit mainly because she could not get in to see his pulmonologist.  On physical exam, she does not have any lower extremity edema or any crackles in the lung on my physical exam.  She appears to be euvolemic based on my exam.  However patient says she has been having worsening dyspnea for the past week.  I am concerned of either pneumonia or COPD exacerbation.  I recommend a CBC, basic metabolic panel, proBNP and a chest x-ray.  She will need to see her pulmonologist early.  Given her advanced age and frail status, I am hesitant to do a trial of diuretic when she does not have any sign of volume overload.  2. CAD s/p CABG: Unfortunately on the last cardiac catheterization, all of her vein grafts down.  Her only patent graft is the LIMA to LAD arterial graft.  She denies any chest pain.  Surprisingly despite all of her vein graft going down, last echocardiogram in 2017 showed normal ejection fraction  3. PAF: Well-controlled on sotalol.  She is maintaining sinus  rhythm on today's EKG.  She is not a candidate for anticoagulation due to history of spontaneous retroperitoneal hematoma  4. Hypertension: Blood pressure stable  5. Hyperlipidemia: On Lipitor  6. DM2: Managed by primary care provider  7. COPD: I suspect her current symptom is likely COPD exacerbation, her lung sounds very tight on physical exam without much air movement.  I recommend treat her for possible COPD exacerbation.  She is also at risk for PE as well.  If her symptom does not respond to treatment, may need CTA of the chest.   Medication Adjustments/Labs and Tests Ordered: Current medicines are reviewed at length with the patient today.  Concerns regarding medicines are outlined above.  Orders Placed This Encounter  Procedures  . DG Chest 2 View  . Pro b natriuretic peptide (BNP)  . Basic metabolic panel  . CBC  . EKG 12-Lead   No orders of the defined types were placed in this encounter.   Patient Instructions  Medication Instructions:  Your physician recommends that you continue on your current medications as directed. Please refer to the Current Medication list given to you today.  *If you need a refill on your cardiac medications before your next appointment, please call your pharmacy*  Lab Work: Your physician recommends that you return for lab work in:   BMET  ProBNP  CBC  If you have labs (blood work) drawn today and your tests are completely normal, you will receive your results only by: Marland Kitchen MyChart Message (if you have MyChart) OR . A  paper copy in the mail If you have any lab test that is abnormal or we need to change your treatment, we will call you to review the results.  Testing/Procedures: A chest x-ray takes a picture of the organs and structures inside the chest, including the heart, lungs, and blood vessels. This test can show several things, including, whether the heart is enlarges; whether fluid is building up in the lungs; and whether pacemaker  / defibrillator leads are still in place. This is done at [address], [place], [place] [address]   Please go to the above address today or tomorrow to have your chest Xray completed  Follow-Up: At [facility], you and your health needs are our priority.  As part of our continuing mission to provide you with exceptional heart care, we have created designated Provider Care Teams.  These Care Teams include your primary Cardiologist (physician) and Advanced Practice Providers (APPs -  Physician Assistants and Nurse Practitioners) who all work together to provide you with the care you need, when you need it.  We recommend signing up for the patient portal called "MyChart".  Sign up information is provided on this After Visit Summary.  MyChart is used to connect with patients for Virtual Visits (Telemedicine).  Patients are able to view lab/test results, encounter notes, upcoming appointments, etc.  Non-urgent messages can be sent to your provider as well.   To learn more about what you can do with MyChart, go to [email or web address].    Your next appointment:   2 week(s)  The format for your next appointment:   In Person  Provider:   Almyra Deforest, PA-C  Other Instructions  IF your breathing gets worse please go to the nearest hospital to be evaluated      Signed, Almyra Deforest, [place]  02/08/2020 12:08 AM    [place]

## 2020-02-06 NOTE — Telephone Encounter (Signed)
Almyra Deforest PA Cardiology states saw patient in office. States patient having symptoms of chest tightness and low oxygen levels. Would like patient to be seen by Pulmonology. Would like for [place] to contact patient. Almyra Deforest PA phone number is [phone].

## 2020-02-06 NOTE — Patient Instructions (Addendum)
Medication Instructions:  Your physician recommends that you continue on your current medications as directed. Please refer to the Current Medication list given to you today.  *If you need a refill on your cardiac medications before your next appointment, please call your pharmacy*  Lab Work: Your physician recommends that you return for lab work in:   BMET  ProBNP  CBC  If you have labs (blood work) drawn today and your tests are completely normal, you will receive your results only by: Marland Kitchen MyChart Message (if you have MyChart) OR . A paper copy in the mail If you have any lab test that is abnormal or we need to change your treatment, we will call you to review the results.  Testing/Procedures: A chest x-ray takes a picture of the organs and structures inside the chest, including the heart, lungs, and blood vessels. This test can show several things, including, whether the heart is enlarges; whether fluid is building up in the lungs; and whether pacemaker / defibrillator leads are still in place. This is done at [facility], [place],  [address]   Please go to the above address today or tomorrow to have your chest Xray completed  Follow-Up: At [facility] you and your health needs are our priority.  As part of our continuing mission to provide you with exceptional heart care, we have created designated Provider Care Teams.  These Care Teams include your primary Cardiologist (physician) and Advanced Practice Providers (APPs -  Physician Assistants and Nurse Practitioners) who all work together to provide you with the care you need, when you need it.  We recommend signing up for the patient portal called "MyChart".  Sign up information is provided on this After Visit Summary.  MyChart is used to connect with patients for Virtual Visits (Telemedicine).  Patients are able to view lab/test results, encounter notes, upcoming appointments, etc.  Non-urgent messages  can be sent to your provider as well.   To learn more about what you can do with MyChart, go to [email or web address].    Your next appointment:   2 week(s)  The format for your next appointment:   In Person  Provider:   Almyra Deforest, PA-C  Other Instructions  IF your breathing gets worse please go to the nearest hospital to be evaluated

## 2020-02-07 ENCOUNTER — Other Ambulatory Visit: Payer: Self-pay

## 2020-02-07 DIAGNOSIS — I5032 Chronic diastolic (congestive) heart failure: Secondary | ICD-10-CM

## 2020-02-07 DIAGNOSIS — I25708 Atherosclerosis of coronary artery bypass graft(s), unspecified, with other forms of angina pectoris: Secondary | ICD-10-CM

## 2020-02-07 DIAGNOSIS — Z79899 Other long term (current) drug therapy: Secondary | ICD-10-CM

## 2020-02-07 DIAGNOSIS — R0600 Dyspnea, unspecified: Secondary | ICD-10-CM

## 2020-02-07 LAB — BASIC METABOLIC PANEL
BUN/Creatinine Ratio: 19 (ref 12–28)
BUN: 20 mg/dL (ref 8–27)
CO2: 28 mmol/L (ref 20–29)
Calcium: 9.7 mg/dL (ref 8.7–10.3)
Chloride: 99 mmol/L (ref 96–106)
Creatinine, Ser: 1.07 mg/dL — ABNORMAL HIGH (ref 0.57–1.00)
GFR calc Af Amer: 56 mL/min/{1.73_m2} — ABNORMAL LOW (ref >59)
GFR calc non Af Amer: 48 mL/min/{1.73_m2} — ABNORMAL LOW (ref >59)
Glucose: 182 mg/dL — ABNORMAL HIGH (ref 65–99)
Potassium: 5.5 mmol/L — ABNORMAL HIGH (ref 3.5–5.2)
Sodium: 143 mmol/L (ref 134–144)

## 2020-02-07 LAB — CBC
Hematocrit: 34.4 % (ref 34.0–46.6)
Hemoglobin: 11.1 g/dL (ref 11.1–15.9)
MCH: 29.5 pg (ref 26.6–33.0)
MCHC: 32.3 g/dL (ref 31.5–35.7)
MCV: 92 fL (ref 79–97)
Platelets: 265 10*3/uL (ref 150–450)
RBC: 3.76 x10E6/uL — ABNORMAL LOW (ref 3.77–5.28)
RDW: 14.4 % (ref 11.7–15.4)
WBC: 9.4 10*3/uL (ref 3.4–10.8)

## 2020-02-07 LAB — PRO B NATRIURETIC PEPTIDE: NT-Pro BNP: 985 pg/mL — ABNORMAL HIGH (ref 0–738)

## 2020-02-07 NOTE — Progress Notes (Signed)
ProBNP only borderline elevated, renal function stable, potassium mildly elevated, recommend reduce benazepril to 10mg  daily and recheck BMET in 1-2 weeks

## 2020-02-07 NOTE — Progress Notes (Signed)
Fortunately no sign of pneumonia, still suspect her dyspnea is pulmonary in nature. Not sure if COPD exacerbation would show up on CXR, but she will need to be evaluated by pulmonology soon. I called pulmonology yesterday.

## 2020-02-07 NOTE — Telephone Encounter (Signed)
Routing this info to [place] whom pt has the appt with tomorrow. This is as an FYI as Almyra Deforest, [place] is the one who had called about pt in case Beth wants to contact him after pt's appt.

## 2020-02-07 NOTE — Progress Notes (Signed)
Given only borderline elevated proBNP, I suspect her breathing issue is more pulmonary than cardiac. Previous plan unchanged to see pulmonology as soon as possible

## 2020-02-07 NOTE — Telephone Encounter (Signed)
Almyra Deforest, [place] called back regarding patient.  Stated he received no phone call back.  Patient is scheduled for appt w/Beth Volanda Napoleon at 9:30 tomorrow.  S/w Cher[place] about putting patient on the schedule.

## 2020-02-08 ENCOUNTER — Telehealth: Payer: Self-pay

## 2020-02-08 ENCOUNTER — Encounter: Payer: Self-pay | Admitting: Primary Care

## 2020-02-08 ENCOUNTER — Ambulatory Visit (INDEPENDENT_AMBULATORY_CARE_PROVIDER_SITE_OTHER): Payer: Medicare Other | Admitting: Primary Care

## 2020-02-08 ENCOUNTER — Encounter: Payer: Self-pay | Admitting: Physician Assistant

## 2020-02-08 ENCOUNTER — Other Ambulatory Visit: Payer: Self-pay

## 2020-02-08 VITALS — BP 148/62 | HR 72 | Temp 97.5°F | Ht 62.0 in | Wt 118.8 lb

## 2020-02-08 DIAGNOSIS — J9611 Chronic respiratory failure with hypoxia: Secondary | ICD-10-CM | POA: Diagnosis not present

## 2020-02-08 DIAGNOSIS — G4730 Sleep apnea, unspecified: Secondary | ICD-10-CM | POA: Diagnosis not present

## 2020-02-08 DIAGNOSIS — J9612 Chronic respiratory failure with hypercapnia: Secondary | ICD-10-CM

## 2020-02-08 DIAGNOSIS — R0602 Shortness of breath: Secondary | ICD-10-CM | POA: Diagnosis not present

## 2020-02-08 DIAGNOSIS — I5032 Chronic diastolic (congestive) heart failure: Secondary | ICD-10-CM | POA: Diagnosis not present

## 2020-02-08 DIAGNOSIS — J4530 Mild persistent asthma, uncomplicated: Secondary | ICD-10-CM

## 2020-02-08 MED ORDER — ALBUTEROL SULFATE HFA 108 (90 BASE) MCG/ACT IN AERS
2.0000 | INHALATION_SPRAY | RESPIRATORY_TRACT | 1 refills | Status: DC | PRN
Start: 2020-02-08 — End: 2020-03-05

## 2020-02-08 MED ORDER — FLUTICASONE PROPIONATE 50 MCG/ACT NA SUSP
1.0000 | Freq: Every day | NASAL | 1 refills | Status: DC
Start: 2020-02-08 — End: 2020-03-05

## 2020-02-08 NOTE — Telephone Encounter (Addendum)
Left a voice message for the patient to give the office a call back as soon as she can to discuss her CXRresults.   ----- Message from [place], [place] sent at 02/07/2020  9:09 AM EDT ----- Fortunately no sign of pneumonia, still suspect her dyspnea is pulmonary in nature. Not sure if COPD exacerbation would show up on CXR, but she will need to be evaluated by pulmonology soon. I called pulmonology yesterday.

## 2020-02-08 NOTE — Patient Instructions (Addendum)
Recommendations: Continue Flovent 2 puffs twice daily Use albuterol 2 puffs every 4-6 hours Use aero chamber with inhalers   Orders: Echocardiogram re: shortness of breath CPAP titration study re: sleep apnea  Needs new order for home oxygen 3L   Follow-up: 6-8 weeks with Dr. Halford Chessman     Home Oxygen Use, Adult When a medical condition keeps you from getting enough oxygen, your health care provider may instruct you to take extra oxygen at home. Your health care provider will let you know:  When to take oxygen.  For how long to take oxygen.  How quickly oxygen should be delivered (flow rate), in liters per minute (LPM or L/M). Home oxygen can be given through:  A mask.  A nasal cannula. This is a device or tube that goes in the nostrils.  A transtracheal catheter. This is a small, flexible tube placed in the trachea.  A tracheostomy. This is a surgically made opening in the trachea. These devices are connected with tubing to an oxygen source, such as:  A tank. Tanks hold oxygen in gas form. They must be replaced when the oxygen is used up.  A liquid oxygen device. This holds oxygen in liquid form. It must be replaced when the oxygen is used up.  An oxygen concentrator machine. This filters oxygen in the room. It uses electricity, so you must have a backup cylinder of oxygen in case the power goes out. Supplies needed: To use oxygen, you will need:  A mask, nasal cannula, transtracheal catheter, or tracheostomy.  An oxygen tank, a liquid oxygen device, or an oxygen concentrator.  The tape that your health care provider recommends (optional). If you use a transtracheal catheter and your prescribed flow rate is 1 LPM or greater, you will also need a humidifier. Risks and complications  Fire. This can happen if the oxygen is exposed to a heat source, flame, or spark.  Injury to skin. This can happen if liquid oxygen touches your skin.  Organ damage. This can happen if you  get too little oxygen. How to use oxygen Your health care provider or a representative from your [place] will show you how to use your oxygen device. Follow her or his instructions. The instructions may look something like this: 1. Wash your hands. 2. If you use an oxygen concentrator, make sure it is plugged in. 3. Place one end of the tube into the port on the tank, device, or machine. 4. Place the mask over your nose and mouth. Or, place the nasal cannula and secure it with tape if instructed. If you use a tracheostomy or transtracheal catheter, connect it to the oxygen source as directed. 5. Make sure the liter-flow setting on the machine is at the level prescribed by your health care provider. 6. Turn on the machine or adjust the knob on the tank or device to the correct liter-flow setting. 7. When you are done, turn off and unplug the machine, or turn the knob to OFF. How to clean and care for the oxygen supplies Nasal cannula  Clean it with a warm, wet cloth daily or as needed.  Wash it with a liquid soap once a week.  Rinse it thoroughly once or twice a week.  Replace it every 2-4 weeks.  If you have an infection, such as a cold or pneumonia, change the cannula when you get better. Mask  Replace it every 2-4 weeks.  If you have an infection, such as a cold  or pneumonia, change the mask when you get better. Humidifier bottle  Wash the bottle between each refill: ? Wash it with soap and warm water. ? Rinse it thoroughly. ? Disinfect it and its top. ? Air-dry it.  Make sure it is dry before you refill it. Oxygen concentrator  Clean the air filter at least twice a week according to directions from your home medical equipment and service company.  Wipe down the cabinet every day. To do this: ? Unplug the unit. ? Wipe down the cabinet with a damp cloth. ? Dry the cabinet. Other equipment  Change any extra tubing every 1-3 months.  Follow instructions  from your health care provider about taking care of any other equipment. Safety tips Fire safety tips   Keep your oxygen and oxygen supplies at least 5 ft away from sources of heat, flames, and sparks at all times.  Do not allow smoking near your oxygen. Put up "no smoking" signs in your home. Avoid smoking areas when in public.  Do not use materials that can burn (are flammable) while you use oxygen.  When you go to a restaurant with portable oxygen, ask to be seated in the nonsmoking section.  Keep a Data processing manager close by. Let your fire department know that you have oxygen in your home.  Test your home smoke detectors regularly. Traveling  Secure your oxygen tank in the vehicle so that it does not move around. Follow instructions from your medical device company about how to safely secure your tank.  Make sure you have enough oxygen for the amount of time you will be away from home.  If you are planning air travel, contact the airline to find out if they allow the use of an approved portable oxygen concentrator. You may also need documents from your health care provider and medical device company before you travel. General safety tips  If you use an oxygen cylinder, make sure it is in a stand or secured to an object that will not move (fixed object).  If you use liquid oxygen, make sure its container is kept upright.  If you use an oxygen concentrator: ? Dance movement psychotherapist company. Make sure you are given priority service in the event that your power goes out. ? Avoid using extension cords, if possible. Follow these instructions at home:  Use oxygen only as told by your health care provider.  Do not use alcohol or other drugs that make you relax (sedating drugs) unless instructed. They can slow down your breathing rate and make it hard to get in enough oxygen.  Know how and when to order a refill of oxygen.  Always keep a spare tank of oxygen. Plan ahead for holidays  when you may not be able to get a prescription filled.  Use water-based lubricants on your lips or nostrils. Do not use oil-based products like petroleum jelly.  To prevent skin irritation on your cheeks or behind your ears, tuck some gauze under the tubing. Contact a health care provider if:  You get headaches often.  You have shortness of breath.  You have a lasting cough.  You have anxiety.  You are sleepy all the time.  You develop an illness that affects your breathing.  You cannot exercise at your regular level.  You are restless.  You have difficult or irregular breathing, and it is getting worse.  You have a fever.  You have persistent redness under your nose. Get help right away if:  You are confused.  You have blue lips or fingernails.  You are struggling to breathe. Summary  Your health care provider or a representative from your [place] will show you how to use your oxygen device. Follow her or his instructions.  If you use an oxygen concentrator, make sure it is plugged in.  Make sure the liter-flow setting on the machine is at the level prescribed by your health care provider.  Keep your oxygen and oxygen supplies at least 5 ft away from sources of heat, flames, and sparks at all times. This information is not intended to replace advice given to you by your health care provider. Make sure you discuss any questions you have with your health care provider. Document Revised: 03/10/2018 Document Reviewed: 04/14/2016 Elsevier Patient Education  [place].

## 2020-02-08 NOTE — Telephone Encounter (Signed)
Patient returning call.

## 2020-02-08 NOTE — Telephone Encounter (Signed)
Returned call to the patient.  The patient has been notified of the result and verbalized understanding.  All questions (if any) were answered. Jacqulynn Cadet, [place] 02/08/2020 4:23 PM

## 2020-02-08 NOTE — Progress Notes (Signed)
@Patient  ID: [name], female    DOB: Jul 19, 1935, 85 y.o.   MRN: [id number]  Chief Complaint  Patient presents with  . Acute Visit    C/o desat. last night 60's,sob increased x 2 wks., cough occass. clear some yellow    Referring provider: Tonia Ghent, MD  HPI: 85 year old female, never smoked. PMH significant for afib, cardiomyopathy, chronic diastolic heart failure, coronary artery disease, hypertension, asthma, restrictive lung disease with kyphoscoliosis, chronic respiratory failure, OSA, sleep related hypoxemia, type 2 diabetes. Patient of Dr. Halford Chessman, last seen on 10/13/19. Maintained on Flovent two puffs twice daily. On chronic oxygen 2L.    02/08/2020 Patient presents today for acute visit with reports of increased shortness of breath and decreased O2 saturations. Accompanied by family friend. States that she has notices shortness of breath at night. Reports desaturation with 2L oxygen into 60-70s on cpap. CXR yesterday showed stable bilateral pleural parenchymal scarring, no active cardiopulmonary disease. BNP was elevated >900. She has had some swelling in her ankles which started two weeks ago. She does not have any significant cough. She does have some clear post nasal drip. Ever now and then she hears wheezing. She is using Flovent two puffs twice a day. She has old albuterol inhaler which she has not used because it is out of date. States that she had been previously on Symbicort but was taken off of this d/t concern that it would prolong her QTC interval.   Sleep tests: ONO with 2 liters 02/04/17 >>test time 7 hrs 52 min. Average SpO2 95%, low SpO2 83%. Spent 5 min with SpO2 <88%. PSG 04/13/17 >>RDI 33.9. Test done with 2 liters oxygen. CPAP titration 06/01/17 >> CPAP 12 with 2 liters oxygen CPAP 11/27/17 to 12/26/17 >>used on 30 of 30 nights with average 7 hrs 40 min. Average AHI 2.5 with CPAP 12 cm H2O. Air leak.  Allergies  Allergen Reactions  . Actos  [Pioglitazone] Other (See Comments)    edema  . Metformin And Related Other (See Comments)    Held due to renal function  . Zocor [Simvastatin] Other (See Comments)    Myalgias    Immunization History  Administered Date(s) Administered  . Fluad Quad(high Dose 65+) 06/20/2019  . Influenza Split 07/09/2011  . Influenza Whole 08/05/2006, 07/28/2007, 07/02/2008  . Influenza, High Dose Seasonal PF 07/07/2017, 07/28/2018  . Influenza,inj,Quad PF,6+ Mos 07/19/2013, 06/25/2014, 08/13/2015, 06/16/2016  . PFIZER SARS-COV-2 Vaccination 11/09/2019, 11/30/2019  . Pneumococcal Conjugate-13 01/03/2015  . Pneumococcal Polysaccharide-23 11/02/2005  . Td 01/10/2001  . Tdap 09/18/2011, 06/16/2016  . Zoster 07/08/2006    Past Medical History:  Diagnosis Date  . Anemia, iron deficiency   . Atrial fibrillation ([place]) 11/28/2010   2D Echo - EF-30-35, left atrium moderate to severely dilated, right ventricle moderately dilated, tricuspid valve mild-moderate regurgitation  . Cancer [facility])    R beast cancer followed by Dr. Hassell Done  . CAP (community acquired pneumonia)   . Chronic kidney disease (CKD), stage II (mild)   . Coronary artery disease   . Diabetes mellitus, type 2 ([place])   . Diverticulitis   . History of breast cancer    Followed yearly by [facility]  . Hyperlipemia   . Hypertension   . Nontraumatic retroperitoneal hematoma   . Osteoporosis   . Peripheral vascular disease ([place])     Tobacco History: Social History   Tobacco Use  Smoking Status Never Smoker  Smokeless Tobacco Never Used   Counseling given:  Not Answered   Outpatient Medications Prior to Visit  Medication Sig Dispense Refill  . amLODipine (NORVASC) 10 MG tablet TAKE ONE TABLET (10 MG) BY MOUTH DAILY. 90 tablet 3  . aspirin EC 81 MG tablet Take 81 mg by mouth daily.    Marland Kitchen atorvastatin (LIPITOR) 80 MG tablet Take 1 tablet (80 mg total) by mouth daily at 6 PM. 90 tablet 3  . BD INSULIN SYRINGE U/F 31G X 5/16" 0.3 ML MISC USE  DAILY AND AS DIRECTED SLIDING SCALE. MULTIPLE INJECTIONS OF INSULIN PER DAY 100 each 5  . benazepril (LOTENSIN) 20 MG tablet Take 1 tablet (20 mg total) by mouth daily. (Patient taking differently: Take 20 mg by mouth daily. Taking 1/2 tablet daily) 90 tablet 2  . Cholecalciferol (VITAMIN D) 1000 UNITS capsule Take 1,000 Units by mouth 2 (two) times daily.      Marland Kitchen FLOVENT HFA 44 MCG/ACT inhaler TAKE 2 PUFFS BY MOUTH TWICE A DAY 10.6 Inhaler 3  . glyBURIDE (DIABETA) 5 MG tablet TAKE 2 TABLETS BY MOUTH DAILY WITH BREAKFAST. 180 tablet 1  . insulin aspart (NOVOLOG) 100 UNIT/ML injection USE PER SLIDING SCALE--200-250=4-5 UNITS, 251-300=5-6 UNITS,301-350=6-7 UNITS, WITH MEALS 30 mL 1  . meclizine (ANTIVERT) 25 MG tablet Take 1 tablet (25 mg total) by mouth daily as needed. For dizziness 30 tablet 6  . nitroGLYCERIN (NITROSTAT) 0.4 MG SL tablet Place 1 tablet (0.4 mg total) under the tongue as needed for chest pain. As directed 25 tablet 2  . NON FORMULARY Oxygen 2 liters 24/7    . nystatin (MYCOSTATIN) 100000 UNIT/ML suspension Take 5 mLs (500,000 Units total) by mouth 4 (four) times daily. Swish and swallow 120 mL 1  . nystatin (MYCOSTATIN/NYSTOP) 100000 UNIT/GM POWD Apply up to 3 times a day if needed. 15 g 5  . ONETOUCH ULTRA test strip USE TO CHECK BLOOD SUGAR 4 TIMES A DAY. INSULIN-DEPENDENT. DIAGNOSIS: E11.9 100 strip 5  . Polyvinyl Alcohol-Povidone (REFRESH OP) Place 1 drop into both eyes daily as needed.    . ranolazine (RANEXA) 500 MG 12 hr tablet Take 1 tablet (500 mg total) by mouth 2 (two) times daily. TAKE 1 TABLET BY MOUTH TWICE A DAY 180 tablet 2  . sotalol (BETAPACE) 80 MG tablet TAKE 0.5 TABLETS (40 MG TOTAL) BY MOUTH 2 (TWO) TIMES DAILY. 90 tablet 1   No facility-administered medications prior to visit.    Review of Systems  Review of Systems  Constitutional: Positive for fatigue.  HENT: Positive for postnasal drip.   Respiratory: Positive for shortness of breath.     Physical  Exam  BP (!) 148/62 (BP Location: Left Arm, Cuff Size: Normal)   Pulse 72   Temp (!) 97.5 F (36.4 C) (Temporal)   Ht 5\' 2"  (1.575 m)   Wt 118 lb 12.8 oz (53.9 kg)   SpO2 91%   BMI 21.73 kg/m  Physical Exam HENT:     Head: Normocephalic and atraumatic.     Mouth/Throat:     Mouth: Mucous membranes are moist.     Pharynx: Oropharynx is clear.  Cardiovascular:     Rate and Rhythm: Normal rate and regular rhythm.     Comments: +trace BLE edema, wearing compression stockings Pulmonary:     Breath sounds: No stridor. No wheezing, rhonchi or rales.     Comments: Diminished, no wheezing  +Dyspnea on ambulation  Skin:    General: Skin is warm and dry.  Neurological:     General:  No focal deficit present.     Mental Status: She is alert and oriented to person, place, and time. Mental status is at baseline.  Psychiatric:        Mood and Affect: Mood normal.        Behavior: Behavior normal.        Thought Content: Thought content normal.        Judgment: Judgment normal.      Lab Results:  CBC    Component Value Date/Time   WBC 9.4 02/06/2020 1449   WBC 8.8 09/12/2019 1235   RBC 3.76 (L) 02/06/2020 1449   RBC 4.03 09/12/2019 1235   HGB 11.1 02/06/2020 1449   HCT 34.4 02/06/2020 1449   PLT 265 02/06/2020 1449   MCV 92 02/06/2020 1449   MCH 29.5 02/06/2020 1449   MCH 29.1 11/24/2016 0313   MCHC 32.3 02/06/2020 1449   MCHC 32.5 09/12/2019 1235   RDW 14.4 02/06/2020 1449   LYMPHSABS 1.6 09/12/2019 1235   MONOABS 2.4 (H) 09/12/2019 1235   EOSABS 0.1 09/12/2019 1235   BASOSABS 0.1 09/12/2019 1235    BMET    Component Value Date/Time   NA 143 02/06/2020 1449   K 5.5 (H) 02/06/2020 1449   CL 99 02/06/2020 1449   CO2 28 02/06/2020 1449   GLUCOSE 182 (H) 02/06/2020 1449   GLUCOSE 178 (H) 09/12/2019 1235   BUN 20 02/06/2020 1449   CREATININE 1.07 (H) 02/06/2020 1449   CREATININE 1.26 (H) 07/06/2016 1559   CALCIUM 9.7 02/06/2020 1449   GFRNONAA 48 (L) 02/06/2020  1449   GFRAA 56 (L) 02/06/2020 1449    BNP No results found for: BNP  ProBNP    Component Value Date/Time   PROBNP 985 (H) 02/06/2020 1449   PROBNP 2262.0 (H) 12/06/2010 0500    Imaging: DG Chest 2 View  Result Date: 02/07/2020 CLINICAL DATA:  Shortness of breath and fatigue for 3 weeks. Atrial fibrillation. Coronary artery disease. EXAM: CHEST - 2 VIEW COMPARISON:  01/19/2017 FINDINGS: Heart size remains within normal limits. Prior CABG again noted. Aortic atherosclerosis incidentally noted. Pleural-parenchymal scarring is again seen in both lower lungs, however there is no evidence of acute infiltrate or pleural effusion. IMPRESSION: Stable bilateral pleural-parenchymal scarring. No active cardiopulmonary disease. Electronically Signed   By: Marlaine Hind M.D.   On: 02/07/2020 08:53     Assessment & Plan:   Sleep apnea - Patient is complaint with CPAP wearing 30/30 days  - Reports O2 dropping into 60-70s on oxygen AND cpap - She is experiencing large amount of airleaks, AHI >5 - Needs CPAP titration study   Chronic respiratory failure with hypoxia and hypercapnia (HCC) - Ambulatory O2 87% on 2L pulsed oxygen  - CXR on 02/06/20 showed no acute cardiopulmonary process  - Increase oxygen to 3L 24/7, referral placed to change setting/service concentrator   Chronic diastolic heart failure (HCC) - Increased lower extremity swelling and dyspnea  - Labs on 5/4 BNP 985 - Labs echocardiogram in 2017, needs repeat  - Will discuss with cardiology about trial diuretic   Asthma - No signs of asthma exacerbation on exam - Lungs clear without wheezing - Continue Flovent 36mcg 2 puffs twice daily - Sending in RX albuterol hfa two puffs every 4-6 hours for shortness of breath to wheezing - Will discuss with cardiology about re-starting ICS/LABA d/t concern for QTC prolongation    Martyn Ehrich, NP 02/09/2020

## 2020-02-08 NOTE — Telephone Encounter (Signed)
Routed to primary

## 2020-02-09 ENCOUNTER — Encounter: Payer: Self-pay | Admitting: Primary Care

## 2020-02-09 NOTE — Assessment & Plan Note (Signed)
-   Patient is complaint with CPAP wearing 30/30 days  - Reports O2 dropping into 60-70s on oxygen AND cpap - She is experiencing large amount of airleaks, AHI >5 - Needs CPAP titration study

## 2020-02-09 NOTE — Assessment & Plan Note (Signed)
-   No signs of asthma exacerbation on exam - Lungs clear without wheezing - Continue Flovent 43mcg 2 puffs twice daily - Sending in RX albuterol hfa two puffs every 4-6 hours for shortness of breath to wheezing - Will discuss with cardiology about re-starting ICS/LABA d/t concern for QTC prolongation

## 2020-02-09 NOTE — Progress Notes (Signed)
Reviewed and agree with assessment/plan.   Cleland Simkins, MD [place] 09/30/2016, 12:24 PM Pager:  [phone]  

## 2020-02-09 NOTE — Assessment & Plan Note (Addendum)
-   Increased lower extremity swelling and dyspnea  - Labs on 5/4 BNP 985 - Labs echocardiogram in 2017, needs repeat  - Will discuss with cardiology about trial diuretic

## 2020-02-09 NOTE — Telephone Encounter (Signed)
Sent him a message and cc'd chart

## 2020-02-09 NOTE — Assessment & Plan Note (Addendum)
-   Ambulatory O2 87% on 2L pulsed oxygen  - CXR on 02/06/20 showed no acute cardiopulmonary process  - Increase oxygen to 3L 24/7, referral placed to change setting/service concentrator

## 2020-02-12 NOTE — Progress Notes (Signed)
Reviewed labs and CXR. ProBNP is lower than past values. No CHF or other new infiltrates on CXR.

## 2020-02-15 ENCOUNTER — Other Ambulatory Visit: Payer: Self-pay

## 2020-02-15 DIAGNOSIS — Z79899 Other long term (current) drug therapy: Secondary | ICD-10-CM

## 2020-02-15 DIAGNOSIS — R0600 Dyspnea, unspecified: Secondary | ICD-10-CM

## 2020-02-16 ENCOUNTER — Encounter (HOSPITAL_COMMUNITY): Payer: Self-pay

## 2020-02-16 ENCOUNTER — Encounter: Payer: Self-pay | Admitting: Physician Assistant

## 2020-02-16 ENCOUNTER — Emergency Department (HOSPITAL_COMMUNITY): Payer: Medicare Other

## 2020-02-16 ENCOUNTER — Inpatient Hospital Stay (HOSPITAL_COMMUNITY)
Admission: EM | Admit: 2020-02-16 | Discharge: 2020-02-24 | DRG: 208 | Disposition: A | Discharge status: Skilled Nursing Facility | Payer: Medicare Other | Attending: Internal Medicine | Admitting: Internal Medicine

## 2020-02-16 ENCOUNTER — Other Ambulatory Visit: Payer: Self-pay

## 2020-02-16 DIAGNOSIS — Z4659 Encounter for fitting and adjustment of other gastrointestinal appliance and device: Secondary | ICD-10-CM

## 2020-02-16 DIAGNOSIS — D509 Iron deficiency anemia, unspecified: Secondary | ICD-10-CM | POA: Diagnosis present

## 2020-02-16 DIAGNOSIS — Z83438 Family history of other disorder of lipoprotein metabolism and other lipidemia: Secondary | ICD-10-CM

## 2020-02-16 DIAGNOSIS — Z20822 Contact with and (suspected) exposure to covid-19: Secondary | ICD-10-CM | POA: Diagnosis not present

## 2020-02-16 DIAGNOSIS — Z8249 Family history of ischemic heart disease and other diseases of the circulatory system: Secondary | ICD-10-CM

## 2020-02-16 DIAGNOSIS — Z803 Family history of malignant neoplasm of breast: Secondary | ICD-10-CM

## 2020-02-16 DIAGNOSIS — E785 Hyperlipidemia, unspecified: Secondary | ICD-10-CM | POA: Diagnosis present

## 2020-02-16 DIAGNOSIS — N183 Chronic kidney disease, stage 3 unspecified: Secondary | ICD-10-CM | POA: Diagnosis present

## 2020-02-16 DIAGNOSIS — J189 Pneumonia, unspecified organism: Principal | ICD-10-CM | POA: Diagnosis present

## 2020-02-16 DIAGNOSIS — J9611 Chronic respiratory failure with hypoxia: Secondary | ICD-10-CM | POA: Diagnosis present

## 2020-02-16 DIAGNOSIS — J80 Acute respiratory distress syndrome: Secondary | ICD-10-CM | POA: Diagnosis not present

## 2020-02-16 DIAGNOSIS — I5031 Acute diastolic (congestive) heart failure: Secondary | ICD-10-CM | POA: Diagnosis present

## 2020-02-16 DIAGNOSIS — E1151 Type 2 diabetes mellitus with diabetic peripheral angiopathy without gangrene: Secondary | ICD-10-CM | POA: Diagnosis present

## 2020-02-16 DIAGNOSIS — I480 Paroxysmal atrial fibrillation: Secondary | ICD-10-CM | POA: Diagnosis present

## 2020-02-16 DIAGNOSIS — E875 Hyperkalemia: Secondary | ICD-10-CM | POA: Diagnosis present

## 2020-02-16 DIAGNOSIS — I130 Hypertensive heart and chronic kidney disease with heart failure and stage 1 through stage 4 chronic kidney disease, or unspecified chronic kidney disease: Secondary | ICD-10-CM | POA: Diagnosis present

## 2020-02-16 DIAGNOSIS — Z951 Presence of aortocoronary bypass graft: Secondary | ICD-10-CM

## 2020-02-16 DIAGNOSIS — G9340 Encephalopathy, unspecified: Secondary | ICD-10-CM | POA: Diagnosis present

## 2020-02-16 DIAGNOSIS — K5900 Constipation, unspecified: Secondary | ICD-10-CM

## 2020-02-16 DIAGNOSIS — I2510 Atherosclerotic heart disease of native coronary artery without angina pectoris: Secondary | ICD-10-CM | POA: Diagnosis present

## 2020-02-16 DIAGNOSIS — Z825 Family history of asthma and other chronic lower respiratory diseases: Secondary | ICD-10-CM

## 2020-02-16 DIAGNOSIS — R54 Age-related physical debility: Secondary | ICD-10-CM | POA: Diagnosis present

## 2020-02-16 DIAGNOSIS — Z833 Family history of diabetes mellitus: Secondary | ICD-10-CM

## 2020-02-16 DIAGNOSIS — I482 Chronic atrial fibrillation, unspecified: Secondary | ICD-10-CM | POA: Diagnosis present

## 2020-02-16 DIAGNOSIS — G4730 Sleep apnea, unspecified: Secondary | ICD-10-CM | POA: Diagnosis present

## 2020-02-16 DIAGNOSIS — R0602 Shortness of breath: Secondary | ICD-10-CM | POA: Diagnosis not present

## 2020-02-16 DIAGNOSIS — E1122 Type 2 diabetes mellitus with diabetic chronic kidney disease: Secondary | ICD-10-CM | POA: Diagnosis present

## 2020-02-16 DIAGNOSIS — J9621 Acute and chronic respiratory failure with hypoxia: Secondary | ICD-10-CM | POA: Diagnosis present

## 2020-02-16 DIAGNOSIS — R0902 Hypoxemia: Secondary | ICD-10-CM | POA: Diagnosis not present

## 2020-02-16 DIAGNOSIS — Z794 Long term (current) use of insulin: Secondary | ICD-10-CM

## 2020-02-16 DIAGNOSIS — M810 Age-related osteoporosis without current pathological fracture: Secondary | ICD-10-CM | POA: Diagnosis present

## 2020-02-16 DIAGNOSIS — I255 Ischemic cardiomyopathy: Secondary | ICD-10-CM | POA: Diagnosis present

## 2020-02-16 DIAGNOSIS — Z933 Colostomy status: Secondary | ICD-10-CM

## 2020-02-16 DIAGNOSIS — Z9981 Dependence on supplemental oxygen: Secondary | ICD-10-CM

## 2020-02-16 DIAGNOSIS — J9622 Acute and chronic respiratory failure with hypercapnia: Secondary | ICD-10-CM | POA: Diagnosis present

## 2020-02-16 DIAGNOSIS — N179 Acute kidney failure, unspecified: Secondary | ICD-10-CM | POA: Diagnosis present

## 2020-02-16 DIAGNOSIS — Z7982 Long term (current) use of aspirin: Secondary | ICD-10-CM

## 2020-02-16 DIAGNOSIS — I5032 Chronic diastolic (congestive) heart failure: Secondary | ICD-10-CM | POA: Diagnosis present

## 2020-02-16 DIAGNOSIS — E1159 Type 2 diabetes mellitus with other circulatory complications: Secondary | ICD-10-CM | POA: Diagnosis present

## 2020-02-16 DIAGNOSIS — I10 Essential (primary) hypertension: Secondary | ICD-10-CM | POA: Diagnosis not present

## 2020-02-16 DIAGNOSIS — I5033 Acute on chronic diastolic (congestive) heart failure: Secondary | ICD-10-CM | POA: Diagnosis present

## 2020-02-16 DIAGNOSIS — Z79899 Other long term (current) drug therapy: Secondary | ICD-10-CM

## 2020-02-16 DIAGNOSIS — J45909 Unspecified asthma, uncomplicated: Secondary | ICD-10-CM | POA: Diagnosis present

## 2020-02-16 DIAGNOSIS — Z853 Personal history of malignant neoplasm of breast: Secondary | ICD-10-CM

## 2020-02-16 DIAGNOSIS — M419 Scoliosis, unspecified: Secondary | ICD-10-CM | POA: Diagnosis present

## 2020-02-16 DIAGNOSIS — B370 Candidal stomatitis: Secondary | ICD-10-CM | POA: Diagnosis not present

## 2020-02-16 DIAGNOSIS — J9690 Respiratory failure, unspecified, unspecified whether with hypoxia or hypercapnia: Secondary | ICD-10-CM

## 2020-02-16 LAB — CBC
HCT: 34.1 % — ABNORMAL LOW (ref 36.0–46.0)
Hemoglobin: 10.2 g/dL — ABNORMAL LOW (ref 12.0–15.0)
MCH: 29 pg (ref 26.0–34.0)
MCHC: 29.9 g/dL — ABNORMAL LOW (ref 30.0–36.0)
MCV: 96.9 fL (ref 80.0–100.0)
Platelets: 249 10*3/uL (ref 150–400)
RBC: 3.52 MIL/uL — ABNORMAL LOW (ref 3.87–5.11)
RDW: 15.1 % (ref 11.5–15.5)
WBC: 10.9 10*3/uL — ABNORMAL HIGH (ref 4.0–10.5)
nRBC: 0.4 % — ABNORMAL HIGH (ref 0.0–0.2)

## 2020-02-16 LAB — BASIC METABOLIC PANEL
Anion gap: 9 (ref 5–15)
BUN: 27 mg/dL — ABNORMAL HIGH (ref 8–23)
CO2: 34 mmol/L — ABNORMAL HIGH (ref 22–32)
Calcium: 9.5 mg/dL (ref 8.9–10.3)
Chloride: 96 mmol/L — ABNORMAL LOW (ref 98–111)
Creatinine, Ser: 1.1 mg/dL — ABNORMAL HIGH (ref 0.44–1.00)
GFR calc Af Amer: 54 mL/min — ABNORMAL LOW (ref >60)
GFR calc non Af Amer: 47 mL/min — ABNORMAL LOW (ref >60)
Glucose, Bld: 187 mg/dL — ABNORMAL HIGH (ref 70–99)
Potassium: 4.9 mmol/L (ref 3.5–5.1)
Sodium: 139 mmol/L (ref 135–145)

## 2020-02-16 LAB — TROPONIN I (HIGH SENSITIVITY)
Troponin I (High Sensitivity): 12 ng/L (ref <18)
Troponin I (High Sensitivity): 12 ng/L (ref <18)

## 2020-02-16 MED ORDER — SODIUM CHLORIDE 0.9% FLUSH: 3.0000 mL | Freq: Once | INTRAVENOUS | Status: DC

## 2020-02-16 MED ORDER — SODIUM CHLORIDE 0.9 % IV SOLN
1.0000 g | Freq: Once | INTRAVENOUS | Status: DC
  Administered 2020-02-17: 1 g via INTRAVENOUS
  Filled 2020-02-16: qty 10

## 2020-02-16 MED ORDER — SODIUM CHLORIDE 0.9 % IV SOLN
500.0000 mg | Freq: Once | INTRAVENOUS | Status: AC
  Administered 2020-02-17: 500 mg via INTRAVENOUS
  Filled 2020-02-16: qty 500
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
## 2020-02-16 NOTE — ED Notes (Signed)
Called pt 3 times no response.

## 2020-02-16 NOTE — ED Provider Notes (Addendum)
[facility] EMERGENCY DEPARTMENT Provider Note   CSN: [id number] Arrival date & time: 02/16/20  1754     History Chief Complaint  Patient presents with  . Shortness of Breath    [name] is a 85 y.o. female.  Patient presents to the emergency department with a chief complaint of shortness of breath.  She has history of A. fib, chronic respiratory failure with hypoxia and hypercapnia, ischemic cardiomyopathy, diastolic heart failure, presents to the emergency department with a chief complaint of shortness of breath.  She states that today while transferring from a wheelchair to a car, she felt extremely short of breath.  She states that over the past few days her O2 saturation has been in the 50s at night.  Ultimately, when the patient could not recover her breath today EMS was called and O2 sat was in the 50s, EMS despite patient being on 3 L home O2.  She wears 3 L 24/7.  She states that she has had some new and worsening cough.  She denies any measured fever.  Denies any known exposures to Covid-19.  She has had both her doses of vaccine.  The history is provided by the patient. No language interpreter was used.       Past Medical History:  Diagnosis Date  . Anemia, iron deficiency   . Atrial fibrillation ([place]) 11/28/2010   2D Echo - EF-30-35, left atrium moderate to severely dilated, right ventricle moderately dilated, tricuspid valve mild-moderate regurgitation  . Cancer [facility])    R beast cancer followed by Dr. Hassell Done  . CAP (community acquired pneumonia)   . Chronic kidney disease (CKD), stage II (mild)   . Coronary artery disease   . Diabetes mellitus, type 2 ([place])   . Diverticulitis   . History of breast cancer    Followed yearly by [facility]  . Hyperlipemia   . Hypertension   . Nontraumatic retroperitoneal hematoma   . Osteoporosis   . Peripheral vascular disease [facility])     Patient Active Problem List   Diagnosis Date Noted  . Thrush 01/14/2020  .  Cough 01/13/2018  . Medicare annual wellness visit, subsequent 09/09/2017  . Renal cyst 09/09/2017  . Sleep related hypoxia 06/02/2017  . Fall at home 05/27/2017  . Abnormal CBC 05/27/2017  . Sleep apnea 04/14/2017  . Restrictive lung disease due to kyphoscoliosis 04/14/2017  . Asthma 02/24/2017  . Chronic respiratory failure with hypoxia and hypercapnia ([place]) 02/24/2017  . Pancreatic cyst 02/22/2017  . Tremor 01/20/2017  . Hypoxia 11/20/2016  . Loss of weight 09/16/2016  . Chronic diastolic heart failure ([place]) 11/11/2015  . Neck pain 01/09/2015  . Advance care planning 01/04/2015  . Risk for falls 08/24/2013  . Cardiomyopathy, ischemic 08/24/2013  . Left tibial fracture 05/05/2013  . Coronary artery disease   . Hypercholesterolemia   . Chronic kidney disease, stage III (moderate) 08/12/2012  . Colostomy in place [facility]) 08/12/2012  . Vitamin D deficiency 11/28/2008  . CAROTID BRUIT 05/24/2008  . Iron deficiency anemia 04/20/2007  . Type 2 diabetes mellitus with vascular disease ([place]) 04/19/2007  . HLD (hyperlipidemia) 04/19/2007  . Essential hypertension 04/19/2007  . Atrial fibrillation ([place]) 04/19/2007  . PAD (peripheral artery disease) ([place]) 04/19/2007  . OSTEOPOROSIS 04/19/2007    Past Surgical History:  Procedure Laterality Date  . APPENDECTOMY  1962  . BREAST SURGERY  02/01   Quadrectomy Breast cancer (Dr. Hassell Done)  . CARDIAC CATHETERIZATION  2000   Iliac Stent (  Dr. Juanita Craver) yearly follow-up  . CARDIAC CATHETERIZATION  01/25/02   High grade LAD disease, total of 3 vess, involvement  . CARDIAC CATHETERIZATION  01/13/07   SV graft to diag. occluded but good backfill o/w ok  . CARDIAC CATHETERIZATION  02/14/10   PTCA & stent vein graft to OM/RCA ([place])  . CARDIAC CATHETERIZATION  04/17/2011   Saphenous vein grafts to the RCA 100% occluded, to the OM 100% occluded, and the diagonal 100% occluded. Internal mammary artery to the LAD, widely patent.  Marland Kitchen CATARACT  EXTRACTION  10/10 and 02/11  . COLOSTOMY    . CORONARY ARTERY BYPASS GRAFT  02/09/02   X 4 ([place])  . DILATION AND CURETTAGE OF UTERUS  1971    miscarrage, abnormal bleeding  . DILATION AND CURETTAGE OF UTERUS  1995   Menorrhagia  . HEMORRHOID SURGERY  09/11/04   Binding Hassell Done)  . PARTIAL COLECTOMY    . SHOULDER ARTHROSCOPY  05/04   Right, with rotator cuff debridement     OB History   No obstetric history on file.     Family History  Problem Relation Age of Onset  . Hypertension Mother   . Diabetes Mother   . Heart disease Mother   . Hypertension Father   . Heart disease Father 5       Heart attack  . Heart disease Brother        CHF  . Hypertension Brother   . COPD Brother        bronchial, frequent pneumonia  . Hypertension Brother   . Hyperlipidemia Brother   . Heart disease Brother        CABG  . Hypertension Brother   . Gout Brother   . Obesity Brother   . Diabetes Son   . Hypertension Son   . Breast cancer Maternal Aunt   . Colon cancer Neg Hx     Social History   Tobacco Use  . Smoking status: Never Smoker  . Smokeless tobacco: Never Used  Substance Use Topics  . Alcohol use: No    Alcohol/week: 0.0 standard drinks  . Drug use: No    Home Medications Prior to Admission medications   Medication Sig Start Date End Date Taking? Authorizing Provider  albuterol (VENTOLIN HFA) 108 (90 Base) MCG/ACT inhaler Inhale 2 puffs into the lungs every 4 (four) hours as needed for wheezing or shortness of breath. 02/08/20   Martyn Ehrich, NP  amLODipine (NORVASC) 10 MG tablet TAKE ONE TABLET (10 MG) BY MOUTH DAILY. Patient taking differently: Take 10 mg by mouth daily.  08/11/19   Croitoru, Mihai, MD  aspirin EC 81 MG tablet Take 81 mg by mouth daily.    [provider]  atorvastatin (LIPITOR) 80 MG tablet Take 1 tablet (80 mg total) by mouth daily at 6 PM. 05/18/19   Croitoru, Mihai, MD  BD INSULIN SYRINGE U/F 31G X 5/16" 0.3 ML MISC USE DAILY  AND AS DIRECTED SLIDING SCALE. MULTIPLE INJECTIONS OF INSULIN PER DAY Patient taking differently: 1 each by Other route See admin instructions. USE DAILY AND AS DIRECTED SLIDING SCALE.  Multiple injections of insulin per day 01/16/19   Tonia Ghent, MD  benazepril (LOTENSIN) 20 MG tablet Take 1 tablet (20 mg total) by mouth daily. Patient taking differently: Take 10 mg by mouth daily.  08/24/19   Croitoru, Mihai, MD  Cholecalciferol (VITAMIN D) 1000 UNITS capsule Take 1,000 Units by mouth 2 (two) times daily.  [provider]  FLOVENT HFA 44 MCG/ACT inhaler TAKE 2 PUFFS BY MOUTH TWICE A DAY Patient taking differently: Inhale 2 puffs into the lungs in the morning and at bedtime.  12/28/19   Chesley Mires, MD  fluticasone (FLONASE) 50 MCG/ACT nasal spray Place 1 spray into both nostrils daily. 02/08/20   Martyn Ehrich, NP  glyBURIDE (DIABETA) 5 MG tablet TAKE 2 TABLETS BY MOUTH DAILY WITH BREAKFAST. Patient taking differently: Take 10 mg by mouth daily with breakfast.  09/19/19   Tonia Ghent, MD  insulin aspart (NOVOLOG) 100 UNIT/ML injection USE PER SLIDING SCALE--200-250=4-5 UNITS, 251-300=5-6 UNITS,301-350=6-7 UNITS, WITH MEALS Patient taking differently: Inject 4-7 Units into the skin See admin instructions. USE PER SLIDING SCALE--200-250=4-5 UNITS, 251-300=5-6 UNITS,301-350=6-7 UNITS, WITH MEALS 12/14/19   Tonia Ghent, MD  meclizine (ANTIVERT) 25 MG tablet Take 1 tablet (25 mg total) by mouth daily as needed. For dizziness 01/03/15   Tonia Ghent, MD  nitroGLYCERIN (NITROSTAT) 0.4 MG SL tablet Place 1 tablet (0.4 mg total) under the tongue as needed for chest pain. As directed 01/19/19   Tonia Ghent, MD  NON FORMULARY 2 L by Continuous infusion (non-IV) route continuous.     [provider]  nystatin (MYCOSTATIN) 100000 UNIT/ML suspension Take 5 mLs (500,000 Units total) by mouth 4 (four) times daily. Swish and swallow 01/11/20   Tonia Ghent, MD    nystatin (MYCOSTATIN/NYSTOP) 100000 UNIT/GM POWD Apply up to 3 times a day if needed. Patient taking differently: Apply 1 application topically 3 (three) times daily as needed (infection.).  12/12/15   Tonia Ghent, MD  [facility] ULTRA test strip USE TO CHECK BLOOD SUGAR 4 TIMES A DAY. INSULIN-DEPENDENT. DIAGNOSIS: E11.9 Patient taking differently: 1 each by Other route in the morning, at noon, in the evening, and at bedtime.  11/13/19   Tonia Ghent, MD  Polyvinyl Alcohol-Povidone (REFRESH OP) Place 1 drop into both eyes daily as needed.    [provider]  ranolazine (RANEXA) 500 MG 12 hr tablet Take 1 tablet (500 mg total) by mouth 2 (two) times daily. TAKE 1 TABLET BY MOUTH TWICE A DAY Patient taking differently: Take 500 mg by mouth 2 (two) times daily.  08/24/19   Croitoru, Mihai, MD  sotalol (BETAPACE) 80 MG tablet TAKE 0.5 TABLETS (40 MG TOTAL) BY MOUTH 2 (TWO) TIMES DAILY. 08/25/19   Croitoru, Mihai, MD    Allergies    Actos [pioglitazone], Metformin and related, and Zocor [simvastatin]  Review of Systems   Review of Systems  All other systems reviewed and are negative.   Physical Exam Updated Vital Signs BP (!) 143/59   Pulse 75   Temp 98.5 F (36.9 C) (Oral)   Resp 18   SpO2 98%   Physical Exam Vitals and nursing note reviewed.  Constitutional:      General: She is not in acute distress.    Appearance: She is well-developed.  HENT:     Head: Normocephalic and atraumatic.  Eyes:     Conjunctiva/sclera: Conjunctivae normal.  Cardiovascular:     Rate and Rhythm: Normal rate and regular rhythm.     Heart sounds: No murmur.  Pulmonary:     Effort: Pulmonary effort is normal. No respiratory distress.     Breath sounds: Normal breath sounds.  Abdominal:     Palpations: Abdomen is soft.     Tenderness: There is no abdominal tenderness.  Musculoskeletal:  General: Normal range of motion.     Cervical back: Neck supple.     Right lower leg: Edema  present.     Left lower leg: Edema present.     Comments: Trace pitting edema bilaterally  Skin:    General: Skin is warm and dry.  Neurological:     Mental Status: She is alert and oriented to person, place, and time.  Psychiatric:        Mood and Affect: Mood normal.        Behavior: Behavior normal.     ED Results / Procedures / Treatments   Labs (all labs ordered are listed, but only abnormal results are displayed) Labs Reviewed  BASIC METABOLIC PANEL - Abnormal; Notable for the following components:      Result Value   Chloride 96 (*)    CO2 34 (*)    Glucose, Bld 187 (*)    BUN 27 (*)    Creatinine, Ser 1.10 (*)    GFR calc non Af Amer 47 (*)    GFR calc Af Amer 54 (*)    All other components within normal limits  CBC - Abnormal; Notable for the following components:   WBC 10.9 (*)    RBC 3.52 (*)    Hemoglobin 10.2 (*)    HCT 34.1 (*)    MCHC 29.9 (*)    nRBC 0.4 (*)    All other components within normal limits  TROPONIN I (HIGH SENSITIVITY)  TROPONIN I (HIGH SENSITIVITY)    EKG None  Radiology DG Chest 2 View  Result Date: 02/16/2020 CLINICAL DATA:  Shortness of breath EXAM: CHEST - 2 VIEW COMPARISON:  02/06/2020 FINDINGS: Cardiomegaly. Prior CABG. Patchy bilateral airspace opacities have progressed since prior study. Possible small effusions. No acute bony abnormality. IMPRESSION: Patchy bilateral airspace disease, worsening since prior study could reflect edema or infection. Small bilateral effusions. Electronically Signed   By: Rolm Baptise M.D.   On: 02/16/2020 19:37    Procedures .Critical Care Performed by: Montine Circle, PA-C Authorized by: Montine Circle, PA-C   Critical care provider statement:    Critical care time (minutes):  35   Critical care was necessary to treat or prevent imminent or life-threatening deterioration of the following conditions:  Respiratory failure   Critical care was time spent personally by me on the following  activities:  Discussions with consultants, evaluation of patient's response to treatment, examination of patient, ordering and performing treatments and interventions, ordering and review of laboratory studies, ordering and review of radiographic studies, pulse oximetry, re-evaluation of patient's condition, obtaining history from patient or surrogate and review of old charts   (including critical care time)  Medications Ordered in ED Medications  sodium chloride flush (NS) 0.9 % injection 3 mL (0 mLs Intravenous Hold 02/16/20 2253)    ED Course  I have reviewed the triage vital signs and the nursing notes.  Pertinent labs & imaging results that were available during my care of the patient were reviewed by me and considered in my medical decision making (see chart for details).    MDM Rules/Calculators/A&P                      This patient complains of shortness of breath, this involves an extensive number of treatment options, and is a complaint that carries with it a high risk of complications and morbidity.  The differential diagnosis includes Covid-19, pneumonia, CHF, ACS, PE.  Pertinent Labs I  ordered, reviewed, and interpreted labs, which included CBC notable for mildly elevated WBC at 10.7, BMP notable for increase in bicarbonate 34 over the past 10 days.  Troponin is 12.  Imaging Interpretation I ordered imaging studies which included chest x-ray.  I independently visualized and interpreted the chest x-ray, which showed patchy bibasilar infiltrates.   Medications I ordered medication Rocephin and azithromycin for CAP.  Sources Previous records obtained and reviewed last echo was in 2017 and showed an EF of 60 to 65%.  She has been admitted for pneumonia in the past, and states that the way that she feels now feels similar to when she had to be admitted.  Consultants Dr. Barbie Banner, recommends BMP and Covid test resulting prior to admitting.  COVID negative.  BNP mildly  elevated.    Critical Interventions    Reassessments After the interventions stated above, I reevaluated the patient and found still with mildly increased work of breathing.  Appreciate Dr. Alcario Drought for admitting.  Final Clinical Impression(s) / ED Diagnoses Final diagnoses:  Multifocal pneumonia    Rx / DC Orders ED Discharge Orders    None       Montine Circle, PA-C 02/17/20 0107    Montine Circle, PA-C 02/17/20 0107    Mesner, Corene Cornea, MD 02/17/20 0400

## 2020-02-17 ENCOUNTER — Observation Stay (HOSPITAL_COMMUNITY): Payer: Medicare Other

## 2020-02-17 ENCOUNTER — Encounter (HOSPITAL_COMMUNITY): Payer: Self-pay | Admitting: Family Medicine

## 2020-02-17 ENCOUNTER — Other Ambulatory Visit: Payer: Self-pay

## 2020-02-17 DIAGNOSIS — I5033 Acute on chronic diastolic (congestive) heart failure: Secondary | ICD-10-CM | POA: Diagnosis present

## 2020-02-17 DIAGNOSIS — G4730 Sleep apnea, unspecified: Secondary | ICD-10-CM | POA: Diagnosis present

## 2020-02-17 DIAGNOSIS — E1122 Type 2 diabetes mellitus with diabetic chronic kidney disease: Secondary | ICD-10-CM | POA: Diagnosis present

## 2020-02-17 DIAGNOSIS — J90 Pleural effusion, not elsewhere classified: Secondary | ICD-10-CM | POA: Diagnosis not present

## 2020-02-17 DIAGNOSIS — R54 Age-related physical debility: Secondary | ICD-10-CM | POA: Diagnosis present

## 2020-02-17 DIAGNOSIS — I4891 Unspecified atrial fibrillation: Secondary | ICD-10-CM | POA: Diagnosis not present

## 2020-02-17 DIAGNOSIS — B370 Candidal stomatitis: Secondary | ICD-10-CM | POA: Diagnosis not present

## 2020-02-17 DIAGNOSIS — J9612 Chronic respiratory failure with hypercapnia: Secondary | ICD-10-CM | POA: Diagnosis not present

## 2020-02-17 DIAGNOSIS — M419 Scoliosis, unspecified: Secondary | ICD-10-CM | POA: Diagnosis present

## 2020-02-17 DIAGNOSIS — G9340 Encephalopathy, unspecified: Secondary | ICD-10-CM | POA: Diagnosis present

## 2020-02-17 DIAGNOSIS — M2550 Pain in unspecified joint: Secondary | ICD-10-CM | POA: Diagnosis not present

## 2020-02-17 DIAGNOSIS — R5381 Other malaise: Secondary | ICD-10-CM | POA: Diagnosis not present

## 2020-02-17 DIAGNOSIS — Z7401 Bed confinement status: Secondary | ICD-10-CM | POA: Diagnosis not present

## 2020-02-17 DIAGNOSIS — I5032 Chronic diastolic (congestive) heart failure: Secondary | ICD-10-CM | POA: Diagnosis not present

## 2020-02-17 DIAGNOSIS — D509 Iron deficiency anemia, unspecified: Secondary | ICD-10-CM | POA: Diagnosis present

## 2020-02-17 DIAGNOSIS — R600 Localized edema: Secondary | ICD-10-CM | POA: Diagnosis not present

## 2020-02-17 DIAGNOSIS — J9622 Acute and chronic respiratory failure with hypercapnia: Secondary | ICD-10-CM | POA: Diagnosis present

## 2020-02-17 DIAGNOSIS — I255 Ischemic cardiomyopathy: Secondary | ICD-10-CM | POA: Diagnosis present

## 2020-02-17 DIAGNOSIS — I739 Peripheral vascular disease, unspecified: Secondary | ICD-10-CM | POA: Diagnosis not present

## 2020-02-17 DIAGNOSIS — E785 Hyperlipidemia, unspecified: Secondary | ICD-10-CM | POA: Diagnosis not present

## 2020-02-17 DIAGNOSIS — J9621 Acute and chronic respiratory failure with hypoxia: Secondary | ICD-10-CM | POA: Diagnosis present

## 2020-02-17 DIAGNOSIS — I130 Hypertensive heart and chronic kidney disease with heart failure and stage 1 through stage 4 chronic kidney disease, or unspecified chronic kidney disease: Secondary | ICD-10-CM | POA: Diagnosis present

## 2020-02-17 DIAGNOSIS — I5031 Acute diastolic (congestive) heart failure: Secondary | ICD-10-CM

## 2020-02-17 DIAGNOSIS — E1151 Type 2 diabetes mellitus with diabetic peripheral angiopathy without gangrene: Secondary | ICD-10-CM | POA: Diagnosis present

## 2020-02-17 DIAGNOSIS — R4182 Altered mental status, unspecified: Secondary | ICD-10-CM | POA: Diagnosis not present

## 2020-02-17 DIAGNOSIS — M6281 Muscle weakness (generalized): Secondary | ICD-10-CM | POA: Diagnosis not present

## 2020-02-17 DIAGNOSIS — N183 Chronic kidney disease, stage 3 unspecified: Secondary | ICD-10-CM | POA: Diagnosis present

## 2020-02-17 DIAGNOSIS — I10 Essential (primary) hypertension: Secondary | ICD-10-CM | POA: Diagnosis not present

## 2020-02-17 DIAGNOSIS — I129 Hypertensive chronic kidney disease with stage 1 through stage 4 chronic kidney disease, or unspecified chronic kidney disease: Secondary | ICD-10-CM | POA: Diagnosis not present

## 2020-02-17 DIAGNOSIS — E875 Hyperkalemia: Secondary | ICD-10-CM | POA: Diagnosis present

## 2020-02-17 DIAGNOSIS — M810 Age-related osteoporosis without current pathological fracture: Secondary | ICD-10-CM | POA: Diagnosis present

## 2020-02-17 DIAGNOSIS — I480 Paroxysmal atrial fibrillation: Secondary | ICD-10-CM | POA: Diagnosis present

## 2020-02-17 DIAGNOSIS — E1159 Type 2 diabetes mellitus with other circulatory complications: Secondary | ICD-10-CM | POA: Diagnosis not present

## 2020-02-17 DIAGNOSIS — Z4682 Encounter for fitting and adjustment of non-vascular catheter: Secondary | ICD-10-CM | POA: Diagnosis not present

## 2020-02-17 DIAGNOSIS — Z20822 Contact with and (suspected) exposure to covid-19: Secondary | ICD-10-CM | POA: Diagnosis present

## 2020-02-17 DIAGNOSIS — N179 Acute kidney failure, unspecified: Secondary | ICD-10-CM | POA: Diagnosis present

## 2020-02-17 DIAGNOSIS — R41841 Cognitive communication deficit: Secondary | ICD-10-CM | POA: Diagnosis not present

## 2020-02-17 DIAGNOSIS — Z9981 Dependence on supplemental oxygen: Secondary | ICD-10-CM | POA: Diagnosis not present

## 2020-02-17 DIAGNOSIS — K8020 Calculus of gallbladder without cholecystitis without obstruction: Secondary | ICD-10-CM | POA: Diagnosis not present

## 2020-02-17 DIAGNOSIS — I2510 Atherosclerotic heart disease of native coronary artery without angina pectoris: Secondary | ICD-10-CM | POA: Diagnosis present

## 2020-02-17 DIAGNOSIS — R0902 Hypoxemia: Secondary | ICD-10-CM | POA: Diagnosis not present

## 2020-02-17 DIAGNOSIS — N1831 Chronic kidney disease, stage 3a: Secondary | ICD-10-CM | POA: Diagnosis not present

## 2020-02-17 DIAGNOSIS — J9611 Chronic respiratory failure with hypoxia: Secondary | ICD-10-CM | POA: Diagnosis not present

## 2020-02-17 DIAGNOSIS — J189 Pneumonia, unspecified organism: Secondary | ICD-10-CM | POA: Diagnosis present

## 2020-02-17 DIAGNOSIS — E1169 Type 2 diabetes mellitus with other specified complication: Secondary | ICD-10-CM | POA: Diagnosis not present

## 2020-02-17 DIAGNOSIS — I482 Chronic atrial fibrillation, unspecified: Secondary | ICD-10-CM | POA: Diagnosis present

## 2020-02-17 LAB — BASIC METABOLIC PANEL
Anion gap: 11 (ref 5–15)
BUN/Creatinine Ratio: 23 (ref 12–28)
BUN: 26 mg/dL (ref 8–27)
BUN: 21 mg/dL (ref 8–23)
CO2: 32 mmol/L — ABNORMAL HIGH (ref 20–29)
CO2: 29 mmol/L (ref 22–32)
Calcium: 10 mg/dL (ref 8.7–10.3)
Calcium: 9 mg/dL (ref 8.9–10.3)
Chloride: 97 mmol/L (ref 96–106)
Chloride: 99 mmol/L (ref 98–111)
Creatinine, Ser: 1.11 mg/dL — ABNORMAL HIGH (ref 0.57–1.00)
Creatinine, Ser: 0.99 mg/dL (ref 0.44–1.00)
GFR calc Af Amer: 53 mL/min/{1.73_m2} — ABNORMAL LOW (ref >59)
GFR calc Af Amer: >60 mL/min (ref >60)
GFR calc non Af Amer: 46 mL/min/{1.73_m2} — ABNORMAL LOW (ref >59)
GFR calc non Af Amer: 53 mL/min — ABNORMAL LOW (ref >60)
Glucose, Bld: 135 mg/dL — ABNORMAL HIGH (ref 70–99)
Glucose: 276 mg/dL — ABNORMAL HIGH (ref 65–99)
Potassium: 5.9 mmol/L (ref 3.5–5.2)
Potassium: 4.4 mmol/L (ref 3.5–5.1)
Sodium: 142 mmol/L (ref 134–144)
Sodium: 139 mmol/L (ref 135–145)

## 2020-02-17 LAB — GLUCOSE, CAPILLARY
Glucose-Capillary: 146 mg/dL — ABNORMAL HIGH (ref 70–99)
Glucose-Capillary: 119 mg/dL — ABNORMAL HIGH (ref 70–99)
Glucose-Capillary: 114 mg/dL — ABNORMAL HIGH (ref 70–99)

## 2020-02-17 LAB — CBG MONITORING, ED
Glucose-Capillary: 80 mg/dL (ref 70–99)
Glucose-Capillary: 155 mg/dL — ABNORMAL HIGH (ref 70–99)

## 2020-02-17 LAB — CBC
HCT: 34.1 % — ABNORMAL LOW (ref 36.0–46.0)
Hemoglobin: 10.1 g/dL — ABNORMAL LOW (ref 12.0–15.0)
MCH: 29 pg (ref 26.0–34.0)
MCHC: 29.6 g/dL — ABNORMAL LOW (ref 30.0–36.0)
MCV: 98 fL (ref 80.0–100.0)
Platelets: 251 10*3/uL (ref 150–400)
RBC: 3.48 MIL/uL — ABNORMAL LOW (ref 3.87–5.11)
RDW: 15.2 % (ref 11.5–15.5)
WBC: 13.4 10*3/uL — ABNORMAL HIGH (ref 4.0–10.5)
nRBC: 0.1 % (ref 0.0–0.2)

## 2020-02-17 LAB — ECHOCARDIOGRAM COMPLETE

## 2020-02-17 LAB — HIV ANTIBODY (ROUTINE TESTING W REFLEX): HIV Screen 4th Generation wRfx: NONREACTIVE

## 2020-02-17 LAB — BRAIN NATRIURETIC PEPTIDE: B Natriuretic Peptide: 387.2 pg/mL — ABNORMAL HIGH (ref 0.0–100.0)

## 2020-02-17 LAB — SARS CORONAVIRUS 2 BY RT PCR (HOSPITAL ORDER, PERFORMED IN ~~LOC~~ HOSPITAL LAB): SARS Coronavirus 2: NEGATIVE

## 2020-02-17 LAB — PROCALCITONIN: Procalcitonin: <0.1 ng/mL

## 2020-02-17 MED ORDER — POLYVINYL ALCOHOL 1.4 % OP SOLN: Freq: Every day | OPHTHALMIC | Status: DC | PRN

## 2020-02-17 MED ORDER — ENOXAPARIN SODIUM 40 MG/0.4ML ~~LOC~~ SOLN
40.0000 mg | SUBCUTANEOUS | Status: DC
  Administered 2020-02-17 – 2020-02-18 (×2): 40 mg via SUBCUTANEOUS
  Filled 2020-02-17 (×2): qty 0.4

## 2020-02-17 MED ORDER — FUROSEMIDE 10 MG/ML IJ SOLN
40.0000 mg | Freq: Every day | INTRAMUSCULAR | Status: DC
  Administered 2020-02-17 – 2020-02-22 (×6): 40 mg via INTRAVENOUS
  Filled 2020-02-17 (×6): qty 4

## 2020-02-17 MED ORDER — AZITHROMYCIN 500 MG PO TABS
500.0000 mg | ORAL_TABLET | Freq: Every day | ORAL | Status: DC
  Administered 2020-02-17 – 2020-02-18 (×2): 500 mg via ORAL
  Filled 2020-02-17: qty 1
  Filled 2020-02-17 (×2): qty 2

## 2020-02-17 MED ORDER — SOTALOL HCL 80 MG PO TABS
40.0000 mg | ORAL_TABLET | Freq: Two times a day (BID) | ORAL | Status: DC
  Administered 2020-02-17 – 2020-02-18 (×4): 40 mg via ORAL
  Filled 2020-02-17 (×5): qty 0.5

## 2020-02-17 MED ORDER — PERFLUTREN LIPID MICROSPHERE
1.0000 mL | INTRAVENOUS | Status: AC | PRN
  Administered 2020-02-17: 5 mL via INTRAVENOUS
  Filled 2020-02-17: qty 10

## 2020-02-17 MED ORDER — BUDESONIDE 0.25 MG/2ML IN SUSP
0.2500 mg | Freq: Two times a day (BID) | RESPIRATORY_TRACT | Status: DC
  Administered 2020-02-17 – 2020-02-24 (×15): 0.25 mg via RESPIRATORY_TRACT
  Filled 2020-02-17 (×16): qty 2

## 2020-02-17 MED ORDER — AMLODIPINE BESYLATE 10 MG PO TABS
10.0000 mg | ORAL_TABLET | Freq: Every day | ORAL | Status: DC
  Administered 2020-02-17 – 2020-02-18 (×2): 10 mg via ORAL
  Filled 2020-02-17 (×3): qty 1

## 2020-02-17 MED ORDER — ALBUTEROL SULFATE (2.5 MG/3ML) 0.083% IN NEBU: 3.0000 mL | INHALATION_SOLUTION | RESPIRATORY_TRACT | Status: DC | PRN

## 2020-02-17 MED ORDER — FLUTICASONE PROPIONATE HFA 44 MCG/ACT IN AERO: 2.0000 | INHALATION_SPRAY | Freq: Two times a day (BID) | RESPIRATORY_TRACT | Status: DC

## 2020-02-17 MED ORDER — VITAMIN D 25 MCG (1000 UNIT) PO TABS
1000.0000 [IU] | ORAL_TABLET | Freq: Two times a day (BID) | ORAL | Status: DC
  Administered 2020-02-17 – 2020-02-18 (×4): 1000 [IU] via ORAL
  Filled 2020-02-17 (×5): qty 1

## 2020-02-17 MED ORDER — INSULIN ASPART 100 UNIT/ML ~~LOC~~ SOLN: 0.0000 [IU] | Freq: Every day | SUBCUTANEOUS | Status: DC

## 2020-02-17 MED ORDER — ATORVASTATIN CALCIUM 40 MG PO TABS
80.0000 mg | ORAL_TABLET | Freq: Every day | ORAL | Status: DC
  Administered 2020-02-18: 80 mg via ORAL
  Filled 2020-02-17 (×2): qty 1

## 2020-02-17 MED ORDER — INSULIN ASPART 100 UNIT/ML ~~LOC~~ SOLN
0.0000 [IU] | Freq: Three times a day (TID) | SUBCUTANEOUS | Status: DC
  Administered 2020-02-18: 1 [IU] via SUBCUTANEOUS
  Administered 2020-02-19: 2 [IU] via SUBCUTANEOUS
  Administered 2020-02-20: 1 [IU] via SUBCUTANEOUS

## 2020-02-17 MED ORDER — SODIUM CHLORIDE 0.9 % IV SOLN
2.0000 g | Freq: Once | INTRAVENOUS | Status: AC
  Administered 2020-02-17: 2 g via INTRAVENOUS
  Filled 2020-02-17: qty 20

## 2020-02-17 MED ORDER — RANOLAZINE ER 500 MG PO TB12
500.0000 mg | ORAL_TABLET | Freq: Two times a day (BID) | ORAL | Status: DC
  Administered 2020-02-17 – 2020-02-24 (×15): 500 mg via ORAL
  Filled 2020-02-17 (×16): qty 1

## 2020-02-17 MED ORDER — BENAZEPRIL HCL 5 MG PO TABS
10.0000 mg | ORAL_TABLET | Freq: Every day | ORAL | Status: DC
  Administered 2020-02-17 – 2020-02-18 (×2): 10 mg via ORAL
  Filled 2020-02-17 (×3): qty 2

## 2020-02-17 MED ORDER — ASPIRIN EC 81 MG PO TBEC
81.0000 mg | DELAYED_RELEASE_TABLET | Freq: Every day | ORAL | Status: DC
  Administered 2020-02-17 – 2020-02-18 (×2): 81 mg via ORAL
  Filled 2020-02-17 (×2): qty 1

## 2020-02-17 MED ORDER — SODIUM CHLORIDE 0.9 % IV SOLN
2.0000 g | INTRAVENOUS | Status: DC
  Administered 2020-02-17 – 2020-02-19 (×3): 2 g via INTRAVENOUS
  Filled 2020-02-17: qty 2
  Filled 2020-02-17: qty 20
  Filled 2020-02-17: qty 2

## 2020-02-17 NOTE — H&P (Signed)
History and Physical    [name] [id number] DOB: 1934/10/16 DOA: 02/16/2020  PCP: Tonia Ghent, MD  Patient coming from: Home  I have personally briefly reviewed patient's old medical records in [place]  Chief Complaint: SOB  HPI: ESLI [name] is a 85 y.o. female with medical history significant of A.Fib, chronic diastolic CHF with preserved EF on most recent echo in 2017, chronic resp failure on 3L home O2, ICM.  Pt presents to ED with c/o SOB.  Pt ongoing for past 10 days +.  Progressively worsening.  Saw cardiology on 5/4, they felt that BLE edema was minimal, pro-bnp was essentially unchanged from priors and that patient likely didn't have worsening CHF as cause for her SOB.  CXR at that time was neg.  (see various cardiology office and progress notes from the last 10 days).  Symptoms have continued to progressively worsen which lead to her coming to ED tonight.  Mild BLE edema intermittently, no wt gain per patient.   ED Course: Tonight her CXR shows worsening / progression / new B multifocal patchy infiltrates compared to 10 days ago.  Infection vs pulm edema.  COVID is neg.  WBC 10.9k  No other SIRS.  Satting okay on her 3L home O2 at rest.  Started on empiric rocephin / azithro, and hospitalist asked to admit.   Review of Systems: As per HPI, otherwise all review of systems negative.  Past Medical History:  Diagnosis Date  . Anemia, iron deficiency   . Atrial fibrillation ([place]) 11/28/2010   2D Echo - EF-30-35, left atrium moderate to severely dilated, right ventricle moderately dilated, tricuspid valve mild-moderate regurgitation  . Cancer [facility])    R beast cancer followed by Dr. Hassell Done  . CAP (community acquired pneumonia)   . Chronic kidney disease (CKD), stage II (mild)   . Coronary artery disease   . Diabetes mellitus, type 2 ([place])   . Diverticulitis   . History of breast cancer    Followed yearly by [facility]  . Hyperlipemia   .  Hypertension   . Nontraumatic retroperitoneal hematoma   . Osteoporosis   . Peripheral vascular disease [facility])     Past Surgical History:  Procedure Laterality Date  . APPENDECTOMY  1962  . BREAST SURGERY  02/01   Quadrectomy Breast cancer (Dr. Hassell Done)  . CARDIAC CATHETERIZATION  2000   Iliac Stent (Dr. Juanita Craver) yearly follow-up  . CARDIAC CATHETERIZATION  01/25/02   High grade LAD disease, total of 3 vess, involvement  . CARDIAC CATHETERIZATION  01/13/07   SV graft to diag. occluded but good backfill o/w ok  . CARDIAC CATHETERIZATION  02/14/10   PTCA & stent vein graft to OM/RCA ([place])  . CARDIAC CATHETERIZATION  04/17/2011   Saphenous vein grafts to the RCA 100% occluded, to the OM 100% occluded, and the diagonal 100% occluded. Internal mammary artery to the LAD, widely patent.  Marland Kitchen CATARACT EXTRACTION  10/10 and 02/11  . COLOSTOMY    . CORONARY ARTERY BYPASS GRAFT  02/09/02   X 4 ([place])  . DILATION AND CURETTAGE OF UTERUS  1971    miscarrage, abnormal bleeding  . DILATION AND CURETTAGE OF UTERUS  1995   Menorrhagia  . HEMORRHOID SURGERY  09/11/04   Binding Hassell Done)  . PARTIAL COLECTOMY    . SHOULDER ARTHROSCOPY  05/04   Right, with rotator cuff debridement     reports that she has never smoked. She has never used smokeless tobacco.  She reports that she does not drink alcohol or use drugs.  Allergies  Allergen Reactions  . Actos [Pioglitazone] Other (See Comments)    edema  . Metformin And Related Other (See Comments)    Held due to renal function  . Zocor [Simvastatin] Other (See Comments)    Myalgias    Family History  Problem Relation Age of Onset  . Hypertension Mother   . Diabetes Mother   . Heart disease Mother   . Hypertension Father   . Heart disease Father 15       Heart attack  . Heart disease Brother        CHF  . Hypertension Brother   . COPD Brother        bronchial, frequent pneumonia  . Hypertension Brother   . Hyperlipidemia Brother     . Heart disease Brother        CABG  . Hypertension Brother   . Gout Brother   . Obesity Brother   . Diabetes Son   . Hypertension Son   . Breast cancer Maternal Aunt   . Colon cancer Neg Hx      Prior to Admission medications   Medication Sig Start Date End Date Taking? Authorizing Provider  albuterol (VENTOLIN HFA) 108 (90 Base) MCG/ACT inhaler Inhale 2 puffs into the lungs every 4 (four) hours as needed for wheezing or shortness of breath. 02/08/20  Yes Martyn Ehrich, NP  amLODipine (NORVASC) 10 MG tablet TAKE ONE TABLET (10 MG) BY MOUTH DAILY. Patient taking differently: Take 10 mg by mouth daily.  08/11/19  Yes Croitoru, Mihai, MD  aspirin EC 81 MG tablet Take 81 mg by mouth at bedtime.    Yes [provider]  atorvastatin (LIPITOR) 80 MG tablet Take 1 tablet (80 mg total) by mouth daily at 6 PM. 05/18/19  Yes Croitoru, Mihai, MD  benazepril (LOTENSIN) 20 MG tablet Take 1 tablet (20 mg total) by mouth daily. Patient taking differently: Take 10 mg by mouth daily.  08/24/19  Yes Croitoru, Mihai, MD  Cholecalciferol (VITAMIN D) 1000 UNITS capsule Take 1,000 Units by mouth 2 (two) times daily.     Yes [provider]  FLOVENT HFA 44 MCG/ACT inhaler TAKE 2 PUFFS BY MOUTH TWICE A DAY Patient taking differently: Inhale 2 puffs into the lungs in the morning and at bedtime.  12/28/19  Yes Chesley Mires, MD  fluticasone (FLONASE) 50 MCG/ACT nasal spray Place 1 spray into both nostrils daily. 02/08/20  Yes Martyn Ehrich, NP  glyBURIDE (DIABETA) 5 MG tablet TAKE 2 TABLETS BY MOUTH DAILY WITH BREAKFAST. Patient taking differently: Take 10 mg by mouth daily with breakfast.  09/19/19  Yes Tonia Ghent, MD  insulin aspart (NOVOLOG) 100 UNIT/ML injection USE PER SLIDING SCALE--200-250=4-5 UNITS, 251-300=5-6 UNITS,301-350=6-7 UNITS, WITH MEALS Patient taking differently: Inject 4-7 Units into the skin See admin instructions. USE PER SLIDING SCALE--200-250=4-5 UNITS,  251-300=5-6 UNITS,301-350=6-7 UNITS, WITH MEALS 12/14/19  Yes Tonia Ghent, MD  meclizine (ANTIVERT) 25 MG tablet Take 1 tablet (25 mg total) by mouth daily as needed. For dizziness 01/03/15  Yes Tonia Ghent, MD  nitroGLYCERIN (NITROSTAT) 0.4 MG SL tablet Place 1 tablet (0.4 mg total) under the tongue as needed for chest pain. As directed 01/19/19  Yes Tonia Ghent, MD  Polyvinyl Alcohol-Povidone (REFRESH OP) Place 1 drop into both eyes daily as needed (for dry eyes).    Yes [provider]  ranolazine (RANEXA) 500 MG  12 hr tablet Take 1 tablet (500 mg total) by mouth 2 (two) times daily. TAKE 1 TABLET BY MOUTH TWICE A DAY Patient taking differently: Take 500 mg by mouth 2 (two) times daily.  08/24/19  Yes Croitoru, Mihai, MD  sotalol (BETAPACE) 80 MG tablet TAKE 0.5 TABLETS (40 MG TOTAL) BY MOUTH 2 (TWO) TIMES DAILY. 08/25/19  Yes Croitoru, Mihai, MD  BD INSULIN SYRINGE U/F 31G X 5/16" 0.3 ML MISC USE DAILY AND AS DIRECTED SLIDING SCALE. MULTIPLE INJECTIONS OF INSULIN PER DAY Patient taking differently: 1 each by Other route See admin instructions. USE DAILY AND AS DIRECTED SLIDING SCALE.  Multiple injections of insulin per day 01/16/19   Tonia Ghent, MD  NON FORMULARY 2 L by Continuous infusion (non-IV) route continuous.     [provider]  ONETOUCH ULTRA test strip USE TO CHECK BLOOD SUGAR 4 TIMES A DAY. INSULIN-DEPENDENT. DIAGNOSIS: E11.9 Patient taking differently: 1 each by Other route in the morning, at noon, in the evening, and at bedtime.  11/13/19   Tonia Ghent, MD    Physical Exam: Vitals:   02/16/20 1803  BP: (!) 143/59  Pulse: 75  Resp: 18  Temp: 98.5 F (36.9 C)  TempSrc: Oral  SpO2: 98%    Constitutional: NAD, calm, comfortable Eyes: PERRL, lids and conjunctivae normal ENMT: Mucous membranes are moist. Posterior pharynx clear of any exudate or lesions.Normal dentition.  Neck: normal, supple, no masses, no thyromegaly Respiratory: clear  to auscultation bilaterally, no wheezing, no crackles. Normal respiratory effort. No accessory muscle use.  Cardiovascular: Regular rate and rhythm, no murmurs / rubs / gallops. Trace BLE edema. 2+ pedal pulses. No carotid bruits.  Abdomen: no tenderness, no masses palpated. No hepatosplenomegaly. Bowel sounds positive.  Musculoskeletal: no clubbing / cyanosis. No joint deformity upper and lower extremities. Good ROM, no contractures. Normal muscle tone.  Skin: no rashes, lesions, ulcers. No induration Neurologic: CN 2-12 grossly intact. Sensation intact, DTR normal. Strength 5/5 in all 4.  Psychiatric: Normal judgment and insight. Alert and oriented x 3. Normal mood.    Labs on Admission: I have personally reviewed following labs and imaging studies  CBC: Recent Labs  Lab 02/16/20 1814  WBC 10.9*  HGB 10.2*  HCT 34.1*  MCV 96.9  PLT 0000000   Basic Metabolic Panel: Recent Labs  Lab 02/16/20 1814  NA 139  K 4.9  CL 96*  CO2 34*  GLUCOSE 187*  BUN 27*  CREATININE 1.10*  CALCIUM 9.5   GFR: Estimated Creatinine Clearance: 31.2 mL/min (A) (by C-G formula based on SCr of 1.1 mg/dL (H)). Liver Function Tests: No results for input(s): AST, ALT, ALKPHOS, BILITOT, PROT, ALBUMIN in the last 168 hours. No results for input(s): LIPASE, AMYLASE in the last 168 hours. No results for input(s): AMMONIA in the last 168 hours. Coagulation Profile: No results for input(s): INR, PROTIME in the last 168 hours. Cardiac Enzymes: No results for input(s): CKTOTAL, CKMB, CKMBINDEX, TROPONINI in the last 168 hours. BNP (last 3 results) Recent Labs    02/06/20 1449  PROBNP 985*   HbA1C: No results for input(s): HGBA1C in the last 72 hours. CBG: Recent Labs  Lab 02/17/20 0118  GLUCAP 80   Lipid Profile: No results for input(s): CHOL, HDL, LDLCALC, TRIG, CHOLHDL, LDLDIRECT in the last 72 hours. Thyroid Function Tests: No results for input(s): TSH, T4TOTAL, FREET4, T3FREE, THYROIDAB in the  last 72 hours. Anemia Panel: No results for input(s): VITAMINB12, FOLATE, FERRITIN, TIBC, IRON,  RETICCTPCT in the last 72 hours. Urine analysis:    Component Value Date/Time   COLORURINE AMBER (A) 11/22/2016 0537   APPEARANCEUR HAZY (A) 11/22/2016 0537   LABSPEC 1.017 11/22/2016 0537   PHURINE 5.0 11/22/2016 0537   GLUCOSEU NEGATIVE 11/22/2016 0537   HGBUR NEGATIVE 11/22/2016 0537   HGBUR small 11/27/2010 1056   [place] 11/22/2016 0537   [place] 11/22/2016 0537   PROTEINUR NEGATIVE 11/22/2016 0537   UROBILINOGEN 1.0 10/25/2014 1754   NITRITE NEGATIVE 11/22/2016 0537   LEUKOCYTESUR NEGATIVE 11/22/2016 0537    Radiological Exams on Admission: DG Chest 2 View  Result Date: 02/16/2020 CLINICAL DATA:  Shortness of breath EXAM: CHEST - 2 VIEW COMPARISON:  02/06/2020 FINDINGS: Cardiomegaly. Prior CABG. Patchy bilateral airspace opacities have progressed since prior study. Possible small effusions. No acute bony abnormality. IMPRESSION: Patchy bilateral airspace disease, worsening since prior study could reflect edema or infection. Small bilateral effusions. Electronically Signed   By: Rolm Baptise M.D.   On: 02/16/2020 19:37    EKG: Independently reviewed.  Assessment/Plan Principal Problem:   Multifocal pneumonia Active Problems:   Type 2 diabetes mellitus with vascular disease (HCC)   Essential hypertension   Chronic kidney disease, stage III (moderate)   Chronic diastolic heart failure (HCC)   Chronic respiratory failure with hypoxia and hypercapnia ([place])    1. Multifocal PNA - 1. DDx includes CHF though PNA is favored currently, see recent cardiologist notes from past week for the extensive discussion of why. 2. PNA pathway 3. Rocephin / azithro 4. COVID neg 5. Cultures pending 6. procalcitonin pending 7. Repeat CBC/BMP in AM 8. Cont pulse ox 9. Tele monitor 10. Getting 2d echo since cardiology ordered this a couple of days ago and wants one  anyhow. 1. Probably can cancel the outpt one in this case. 2. Just let cards know on discharge 11. If pt not improving with ABx, consider that we may all be mistaken and it could actually be CHF. 2. Chronic diastolic CHF - 1. See above discussion 3. DM2 - 1. Sensitive SSI AC/HS 2. Hold home glyburide 4. CKD stage 3 - 1. Chronic and stable currently 5. HTN - 1. Cont home BP meds 6. PAF - 1. NSR currently 2. Cont sotalol 3. Tele monitor 4. Not on full anticoag due to h/o spontaneous retroperitoneal hematoma.  DVT prophylaxis: Lovenox Code Status: Full Family Communication: Daughter at bedside Disposition Plan: Home after breathing improved Consults called: None Admission status: Place in 50   Mikiah Demond, [place] [facility]  How to contact the [facility] Attending or Consulting provider [place] or covering provider during after hours [place], for this patient?  1. Check the care team in [facility] and look for a) attending/consulting [facility] provider listed and b) the [facility] team listed 2. Log into [email or web address]  Amion Physician Scheduling and messaging for groups and whole hospitals  On call and physician scheduling software for group practices, residents, hospitalists and other medical providers for call, clinic, rotation and shift schedules. On[employer] is a hospital-wide system for scheduling doctors and paging doctors on call. EasyPlot is for scientific plotting and data analysis.  [email or web address]  and use [place]'s universal password to access. If you do not have the password, please contact the hospital operator.  3. Locate the [facility] provider you are looking for under [facility] and page to a number that you can be directly reached. 4. If you still have difficulty reaching the provider, please page  the [facility] (Director on Call) for the Hospitalists listed on amion for assistance.  02/17/2020, 1:31 AM

## 2020-02-17 NOTE — ED Notes (Signed)
Breakfast Ordered 

## 2020-02-17 NOTE — ED Provider Notes (Signed)
Medical screening examination/treatment/procedure(s) were conducted as a shared visit with non-physician practitioner(s) and myself.  I personally evaluated the patient during the encounter.  Here with dyspnea and hypoxia.  On exam she has crackles bilaterally.  Tachypneic and increased work of breathing mild respiratory distress. With the amount of hypoxia she has and work of breathing patient will need to be admitted.  Covid negative would likely treat for [facility] pneumonia plus or minus some level of pulmonary edema.  EKG Interpretation  Date/Time:  Friday Feb 16 2020 17:53:47 EDT Ventricular Rate:  76 PR Interval:  136 QRS Duration: 72 QT Interval:  374 QTC Calculation: 420 R Axis:   14 Text Interpretation: Normal sinus rhythm Cannot rule out Anterior infarct , age undetermined ST & T wave abnormality, consider lateral ischemia Abnormal ECG Confirmed by Merrily Pew [phone]) on 02/17/2020 4:01:22 AM      Huey Scalia, Corene Cornea, MD 02/17/20 [id number]

## 2020-02-17 NOTE — Progress Notes (Signed)
  Echocardiogram 2D Echocardiogram has been performed with [facility].  [name] 02/17/2020, 11:51 AM

## 2020-02-17 NOTE — Progress Notes (Signed)
PROGRESS NOTE  Brief Narrative: [name] is an 85 y.o. female with a history of chronic HFpEF, chronic respiratory failure on 3L O2, CAD, ICM, atrial fibrillation, stage III CKD, and T2DM who presented to the ED with progressive dyspnea as well as leg swelling. CXR showed worsening bilateral patchy airspace infiltrates vs. pulmonary edema. She was admitted, given ceftriaxone and azithromycin. IV lasix added.   Subjective: Feels weak, usually gets around with a cane. Reports some orthopnea, denies fever. Dyspnea is unchanged from admission. Leg swelling stable.  Objective: BP (!) 137/58   Pulse 78   Temp 98.5 F (36.9 C) (Oral)   Resp (!) 22   SpO2 (!) 87%   Gen: Elderly pleasant female in no distress Pulm: Nonlabored with 5-6L O2, +bibasilar crackles.  CV: RRR, no murmur, no JVD, 1+ pitting pedal edema GI: Soft, NT, ND, +BS  Neuro: Alert and oriented. No focal deficits. Skin: No rashes, lesions or ulcers  Echocardiogram: 1. Left ventricular ejection fraction, by estimation, is 60 to 65%. The  left ventricle has normal function. The left ventricle has no regional  wall motion abnormalities. There is moderate left ventricular hypertrophy.  Left ventricular diastolic  parameters are consistent with Grade II diastolic dysfunction  (pseudonormalization). There is the interventricular septum is flattened  in systole and diastole, consistent with right ventricular pressure and  volume overload.  2. Right ventricular systolic function is mildly reduced. The right  ventricular size is mildly enlarged. There is severely elevated pulmonary  artery systolic pressure. The estimated right ventricular systolic  pressure is [id number] mmHg.  3. Left atrial size was moderately dilated.  4. The mitral valve is grossly normal. Mild mitral valve regurgitation.  5. Tricuspid valve regurgitation is mild to moderate.  6. The aortic valve is tricuspid. Aortic valve regurgitation is not   visualized. Mild aortic valve sclerosis is present, with no evidence of  aortic valve stenosis.  7. The inferior vena cava is normal in size with <50% respiratory  variability, suggesting right atrial pressure of 8 mmHg.   Assessment & Plan: Principal Problem:   Multifocal pneumonia Active Problems:   Type 2 diabetes mellitus with vascular disease ([place])   Essential hypertension   Chronic kidney disease, stage III (moderate)   Chronic diastolic heart failure (HCC)   Chronic respiratory failure with hypoxia and hypercapnia (HCC)   Acute on chronic respiratory failure with hypoxia (HCC)  Acute on chronic hypoxemic respiratory failure: Multifactorial including multifocal pneumonia, pulmonary HTN, acute on chronic diastolic CHF. SARS-CoV-2 negative. Echo with grade II diastolic dysfunction, preserved LVEF - Continue ceftriaxone, azithromycin with leukocytosis and opacities. Procalcitonin undetectable, so will continue for right now. - Start lasix 40mg  IV daily  Other treatments per H&P from this morning by Dr. Alcario Drought.  This patient will be admitted as an inpatient because she will require ongoing work up and treatments for multifactorial acute on chronic respiratory failure that will cross 2 midnights.  Patrecia Pour, MD Pager on amion 02/17/2020, 2:51 PM

## 2020-02-17 NOTE — ED Notes (Signed)
Monitor rang w/sats of 40%'s w/a good pleth, and upon entry to room, pt was in distress sitting up on side of bed, 3LNC not in nares. Placed on NRB for about 3 min, and sats returned to 100%. MD notified, pt repositioned and comfortable again. Back on 3LNC, sats at mid-90%'s

## 2020-02-18 DIAGNOSIS — J189 Pneumonia, unspecified organism: Principal | ICD-10-CM

## 2020-02-18 DIAGNOSIS — J9621 Acute and chronic respiratory failure with hypoxia: Secondary | ICD-10-CM

## 2020-02-18 LAB — CBC
HCT: 39.1 % (ref 36.0–46.0)
Hemoglobin: 11.5 g/dL — ABNORMAL LOW (ref 12.0–15.0)
MCH: 28.9 pg (ref 26.0–34.0)
MCHC: 29.4 g/dL — ABNORMAL LOW (ref 30.0–36.0)
MCV: 98.2 fL (ref 80.0–100.0)
Platelets: 253 10*3/uL (ref 150–400)
RBC: 3.98 MIL/uL (ref 3.87–5.11)
RDW: 14.9 % (ref 11.5–15.5)
WBC: 13.5 10*3/uL — ABNORMAL HIGH (ref 4.0–10.5)
nRBC: 0.2 % (ref 0.0–0.2)

## 2020-02-18 LAB — BASIC METABOLIC PANEL
Anion gap: 10 (ref 5–15)
BUN: 22 mg/dL (ref 8–23)
CO2: 34 mmol/L — ABNORMAL HIGH (ref 22–32)
Calcium: 9.5 mg/dL (ref 8.9–10.3)
Chloride: 97 mmol/L — ABNORMAL LOW (ref 98–111)
Creatinine, Ser: 1.11 mg/dL — ABNORMAL HIGH (ref 0.44–1.00)
GFR calc Af Amer: 54 mL/min — ABNORMAL LOW (ref >60)
GFR calc non Af Amer: 46 mL/min — ABNORMAL LOW (ref >60)
Glucose, Bld: 83 mg/dL (ref 70–99)
Potassium: 4.6 mmol/L (ref 3.5–5.1)
Sodium: 141 mmol/L (ref 135–145)

## 2020-02-18 LAB — GLUCOSE, CAPILLARY
Glucose-Capillary: 61 mg/dL — ABNORMAL LOW (ref 70–99)
Glucose-Capillary: 100 mg/dL — ABNORMAL HIGH (ref 70–99)
Glucose-Capillary: 128 mg/dL — ABNORMAL HIGH (ref 70–99)
Glucose-Capillary: 73 mg/dL (ref 70–99)
Glucose-Capillary: 118 mg/dL — ABNORMAL HIGH (ref 70–99)
Glucose-Capillary: 136 mg/dL — ABNORMAL HIGH (ref 70–99)

## 2020-02-18 NOTE — Progress Notes (Addendum)
PROGRESS NOTE  [name]  [id number] DOB: [birth year] DOA: 02/16/2020 PCP: Tonia Ghent, MD   Brief Narrative: [name] is an 85 y.o. female with a history of chronic HFpEF, chronic respiratory failure on 3L O2, CAD, ICM, atrial fibrillation, stage III CKD, and T2DM who presented to the ED with progressive dyspnea as well as leg swelling. CXR showed worsening bilateral patchy airspace infiltrates vs. pulmonary edema. She was admitted, given ceftriaxone and azithromycin. IV lasix added. Echocardiogram reveals grade II diastolic dysfunction, normal LV systolic function, RV overload with some dilatation of IVC. Pulmonology consulted.  Assessment & Plan: Principal Problem:   Multifocal pneumonia Active Problems:   Type 2 diabetes mellitus with vascular disease ([place])   Essential hypertension   Chronic kidney disease, stage III (moderate)   Chronic diastolic heart failure (HCC)   Chronic respiratory failure with hypoxia and hypercapnia (HCC)   Acute on chronic respiratory failure with hypoxia (HCC)  Acute on chronic hypoxemic respiratory failure: Multifactorial including multifocal pneumonia, pulmonary HTN, acute on chronic diastolic CHF on background of restrictive lung disease, asthma without exacerbation, and sleep disordered breathing. SARS-CoV-2 negative. BNP elevated, PCT negative. Echo with grade II diastolic dysfunction, preserved LVEF - Continue ceftriaxone, azithromycin with leukocytosis and opacities. Procalcitonin undetectable, so will continue for right now. - Continue lasix. - Repeat CXR 5/17. - Continue bronchodilators.  Stage III CKD:  - Monitor with diuresis, at baseline 0.9 - 1.1.   T2DM: HbA1c 7.3%. At inpatient goal. - Continue SSI, holding OSU  PAF: Currently NSR - Continue sotalol - No therapeutic AC due to hx spontaneous retroperitoneal hemorrhage.   CAD: No chest pain currently.  - Continue ASA, statin, prn NTG, ranexa, beta blocker  HTN:  -  Continue norvasc, ACEi  History of diverticulitis: Quiescent  History of breast CA:   Iron deficiency anemia:  - At baseline.  Osteoporosis:  - Continue vitamin D  DVT prophylaxis: Continue prophylactic lovenox Code Status: Full Family Communication: None at bedside this AM Disposition Plan:  Status is: Inpatient  Remains inpatient appropriate because:IV treatments appropriate due to intensity of illness or inability to take PO, requiring more oxygen than baseline and remains volume overloaded.   Dispo: The patient is from: Home              Anticipated d/c is to: Home. PT consulted.              Anticipated d/c date is: 2 days              Patient currently is not medically stable to d/c.  Consultants:   Pulmonology  Procedures:   Echocardiogram  Antimicrobials:  Ceftriaxone, azithromycin   Subjective: Feels weak, shortness of breath moderate, worse than baseline  Objective: Vitals:   02/18/20 0013 02/18/20 0737 02/18/20 0826 02/18/20 0828  BP: (!) 121/53  (!) 139/57 (!) 111/96  Pulse: 66 71 74 77  Resp: 18 20 19    Temp: 98 F (36.7 C)  98.2 F (36.8 C)   TempSrc: Oral     SpO2: (!) 88% 91% (!) 77% 91%  Weight:      Height:        Intake/Output Summary (Last 24 hours) at 02/18/2020 1444 Last data filed at 02/18/2020 0400 Gross per 24 hour  Intake 275.01 ml  Output --  Net 275.01 ml   Filed Weights   02/17/20 2016  Weight: 54 kg    Gen: Elderly female in no distress Pulm: Non-labored breathing  supplemental oxygen. Kyphosis, crackles bilaterally.  CV: Regular rate and rhythm. No murmur, rub, or gallop. No JVD, ++ pedal edema. GI: Abdomen soft, non-tender, non-distended, with normoactive bowel sounds. No organomegaly or masses felt. Ext: Warm, no deformities Skin: No rashes, lesions or ulcers Neuro: Alert and oriented. No focal neurological deficits. Psych: Judgement and insight appear normal. Mood & affect appropriate.   Data Reviewed: I have  personally reviewed following labs and imaging studies  CBC: Recent Labs  Lab 02/16/20 1814 02/17/20 0450 02/18/20 0231  WBC 10.9* 13.4* 13.5*  HGB 10.2* 10.1* 11.5*  HCT 34.1* 34.1* 39.1  MCV 96.9 98.0 98.2  PLT 249 251 [id number]   Basic Metabolic Panel: Recent Labs  Lab 02/16/20 1209 02/16/20 1814 02/17/20 0450 02/18/20 0231  NA 142 139 139 141  K 5.9* 4.9 4.4 4.6  CL 97 96* 99 97*  CO2 32* 34* 29 34*  GLUCOSE 276* 187* 135* 83  BUN 26 27* 21 22  CREATININE 1.11* 1.10* 0.99 1.11*  CALCIUM 10.0 9.5 9.0 9.5   GFR: Estimated Creatinine Clearance: 30.9 mL/min (A) (by C-G formula based on SCr of 1.11 mg/dL (H)). Liver Function Tests: No results for input(s): AST, ALT, ALKPHOS, BILITOT, PROT, ALBUMIN in the last 168 hours. No results for input(s): LIPASE, AMYLASE in the last 168 hours. No results for input(s): AMMONIA in the last 168 hours. Coagulation Profile: No results for input(s): INR, PROTIME in the last 168 hours. Cardiac Enzymes: No results for input(s): CKTOTAL, CKMB, CKMBINDEX, TROPONINI in the last 168 hours. BNP (last 3 results) Recent Labs    02/06/20 1449  PROBNP 985*   HbA1C: No results for input(s): HGBA1C in the last 72 hours. CBG: Recent Labs  Lab 02/17/20 1514 02/17/20 2123 02/18/20 0740 02/18/20 1037 02/18/20 1207  GLUCAP 119* 114* 61* 100* 128*   Lipid Profile: No results for input(s): CHOL, HDL, LDLCALC, TRIG, CHOLHDL, LDLDIRECT in the last 72 hours. Thyroid Function Tests: No results for input(s): TSH, T4TOTAL, FREET4, T3FREE, THYROIDAB in the last 72 hours. Anemia Panel: No results for input(s): VITAMINB12, FOLATE, FERRITIN, TIBC, IRON, RETICCTPCT in the last 72 hours. Urine analysis:    Component Value Date/Time   COLORURINE AMBER (A) 11/22/2016 0537   APPEARANCEUR HAZY (A) 11/22/2016 0537   LABSPEC 1.017 11/22/2016 0537   PHURINE 5.0 11/22/2016 0537   GLUCOSEU NEGATIVE 11/22/2016 0537   HGBUR NEGATIVE 11/22/2016 0537   HGBUR  small 11/27/2010 [place] 11/22/2016 [place] 11/22/2016 0537   PROTEINUR NEGATIVE 11/22/2016 0537   UROBILINOGEN 1.0 10/25/2014 1754   NITRITE NEGATIVE 11/22/2016 0537   LEUKOCYTESUR NEGATIVE 11/22/2016 0537   Recent Results (from the past 240 hour(s))  SARS Coronavirus 2 by RT PCR (hospital order, performed in [facility] hospital lab) Nasopharyngeal Nasopharyngeal Swab     Status: None   Collection Time: 02/16/20 11:32 PM   Specimen: Nasopharyngeal Swab  Result Value Ref Range Status   SARS Coronavirus 2 NEGATIVE NEGATIVE Final    Comment: (NOTE) SARS-CoV-2 target nucleic acids are NOT DETECTED. The SARS-CoV-2 RNA is generally detectable in upper and lower respiratory specimens during the acute phase of infection. The lowest concentration of SARS-CoV-2 viral copies this assay can detect is 250 copies / mL. A negative result does not preclude SARS-CoV-2 infection and should not be used as the sole basis for treatment or other patient management decisions.  A negative result may occur with improper specimen collection / handling, submission of specimen  other than nasopharyngeal swab, presence of viral mutation(s) within the areas targeted by this assay, and inadequate number of viral copies (<250 copies / mL). A negative result must be combined with clinical observations, patient history, and epidemiological information. Fact Sheet for Patients:   StrictlyIdeas.no Fact Sheet for Healthcare Providers: BankingDealers.co.za This test is not yet approved or cleared  by the [place] FDA and has been authorized for detection and/or diagnosis of SARS-CoV-2 by FDA under an Emergency Use Authorization (EUA).  This EUA will remain in effect (meaning this test can be used) for the duration of the COVID-19 declaration under Section 564(b)(1) of the Act, 21 U.S.C. section 360bbb-3(b)(1), unless the  authorization is terminated or revoked sooner. Performed at [facility], [place] [address] [place], [place] [address]       Radiology Studies: DG Chest 2 View  Result Date: 02/16/2020 CLINICAL DATA:  Shortness of breath EXAM: CHEST - 2 VIEW COMPARISON:  02/06/2020 FINDINGS: Cardiomegaly. Prior CABG. Patchy bilateral airspace opacities have progressed since prior study. Possible small effusions. No acute bony abnormality. IMPRESSION: Patchy bilateral airspace disease, worsening since prior study could reflect edema or infection. Small bilateral effusions. Electronically Signed   By: Rolm Baptise M.D.   On: 02/16/2020 19:37   ECHOCARDIOGRAM COMPLETE  Result Date: 02/17/2020    ECHOCARDIOGRAM REPORT   Patient Name:   JAMAIRA LYERLY Date of Exam: 02/17/2020 Medical Rec #:  [id number]    Height:       62.0 in Accession #:    [id number]   Weight:       118.8 lb Date of Birth:  [birth year]   BSA:          1.532 m Patient Age:    32 years     BP:           137/58 mmHg Patient Gender: F            HR:           78 bpm. Exam Location:  Inpatient Procedure: 2D Echo, Cardiac Doppler, Color Doppler and Intracardiac            Opacification Agent Indications:    CHF-Acute Diastolic 0000000  History:        Patient has prior history of Echocardiogram examinations, most                 recent 01/31/2016. CHF, Arrythmias:Atrial Fibrillation; Risk                 Factors:Diabetes, Hypertension and Dyslipidemia. CKD.  Sonographer:    Clayton Lefort RDCS (AE) Referring Phys: [place]  1. Left ventricular ejection fraction, by estimation, is 60 to 65%. The left ventricle has normal function. The left ventricle has no regional wall motion abnormalities. There is moderate left ventricular hypertrophy. Left ventricular diastolic parameters are consistent with Grade II diastolic dysfunction (pseudonormalization). There is the interventricular septum is flattened in systole and diastole, consistent with  right ventricular pressure and volume overload.  2. Right ventricular systolic function is mildly reduced. The right ventricular size is mildly enlarged. There is severely elevated pulmonary artery systolic pressure. The estimated right ventricular systolic pressure is [id number] mmHg.  3. Left atrial size was moderately dilated.  4. The mitral valve is grossly normal. Mild mitral valve regurgitation.  5. Tricuspid valve regurgitation is mild to moderate.  6. The aortic valve is tricuspid. Aortic valve regurgitation is not visualized. Mild aortic valve sclerosis  is present, with no evidence of aortic valve stenosis.  7. The inferior vena cava is normal in size with <50% respiratory variability, suggesting right atrial pressure of 8 mmHg. FINDINGS  Left Ventricle: Left ventricular ejection fraction, by estimation, is 60 to 65%. The left ventricle has normal function. The left ventricle has no regional wall motion abnormalities. Definity contrast agent was given IV to delineate the left ventricular  endocardial borders. The left ventricular internal cavity size was normal in size. There is moderate left ventricular hypertrophy. The interventricular septum is flattened in systole and diastole, consistent with right ventricular pressure and volume overload. Left ventricular diastolic parameters are consistent with Grade II diastolic dysfunction (pseudonormalization). Right Ventricle: The right ventricular size is mildly enlarged. No increase in right ventricular wall thickness. Right ventricular systolic function is mildly reduced. There is severely elevated pulmonary artery systolic pressure. The tricuspid regurgitant velocity is 3.62 m/s, and with an assumed right atrial pressure of 8 mmHg, the estimated right ventricular systolic pressure is [id number] mmHg. Left Atrium: Left atrial size was moderately dilated. Right Atrium: Right atrial size was normal in size. Pericardium: There is no evidence of pericardial effusion. Mitral  Valve: The mitral valve is grossly normal. There is mild thickening of the mitral valve leaflet(s). Mild mitral valve regurgitation. MV peak gradient, 13.5 mmHg. The mean mitral valve gradient is 3.0 mmHg. Tricuspid Valve: The tricuspid valve is grossly normal. Tricuspid valve regurgitation is mild to moderate. Aortic Valve: The aortic valve is tricuspid. Aortic valve regurgitation is not visualized. Mild aortic valve sclerosis is present, with no evidence of aortic valve stenosis. Mild aortic valve annular calcification. Aortic valve mean gradient measures 3.0  mmHg. Aortic valve peak gradient measures 6.6 mmHg. Aortic valve area, by VTI measures 2.41 cm. Pulmonic Valve: The pulmonic valve was grossly normal. Pulmonic valve regurgitation is not visualized. Aorta: The aortic root is normal in size and structure. Venous: The inferior vena cava is normal in size with less than 50% respiratory variability, suggesting right atrial pressure of 8 mmHg. IAS/Shunts: No atrial level shunt detected by color flow Doppler.  LEFT VENTRICLE PLAX 2D LVIDd:         2.60 cm LVIDs:         1.70 cm LV PW:         1.60 cm LV IVS:        1.50 cm LVOT diam:     2.00 cm LV SV:         78 LV SV Index:   51 LVOT Area:     3.14 cm  RIGHT VENTRICLE             IVC RV Basal diam:  2.80 cm     IVC diam: 1.80 cm RV S prime:     10.60 cm/s TAPSE (M-mode): 1.5 cm LEFT ATRIUM             Index       RIGHT ATRIUM           Index LA diam:        3.20 cm 2.09 cm/m  RA Area:     15.60 cm LA Vol (A2C):   58.5 ml 38.18 ml/m RA Volume:   37.30 ml  24.35 ml/m LA Vol (A4C):   74.4 ml 48.56 ml/m LA Biplane Vol: 66.4 ml 43.34 ml/m  AORTIC VALVE AV Area (Vmax):    2.60 cm AV Area (Vmean):   2.61 cm AV Area (VTI):  2.41 cm AV Vmax:           128.00 cm/s AV Vmean:          85.900 cm/s AV VTI:            0.323 m AV Peak Grad:      6.6 mmHg AV Mean Grad:      3.0 mmHg LVOT Vmax:         106.00 cm/s LVOT Vmean:        71.500 cm/s LVOT VTI:           0.248 m LVOT/AV VTI ratio: 0.77  AORTA Ao Root diam: 3.20 cm MITRAL VALVE             TRICUSPID VALVE MV Area (PHT): 3.93 cm  TR Peak grad:   52.4 mmHg MV Peak grad:  13.5 mmHg TR Vmax:        362.00 cm/s MV Mean grad:  3.0 mmHg MV Vmax:       1.84 m/s  SHUNTS MV Vmean:      70.7 cm/s Systemic VTI:  0.25 m                          Systemic Diam: 2.00 cm Rozann Lesches MD Electronically signed by Rozann Lesches MD Signature Date/Time: 02/17/2020/12:27:17 PM    Final     Scheduled Meds: . amLODipine  10 mg Oral Daily  . aspirin EC  81 mg Oral QHS  . atorvastatin  80 mg Oral q1800  . azithromycin  500 mg Oral Daily  . benazepril  10 mg Oral Daily  . budesonide (PULMICORT) nebulizer solution  0.25 mg Nebulization BID  . cholecalciferol  1,000 Units Oral BID  . enoxaparin (LOVENOX) injection  40 mg Subcutaneous Q24H  . furosemide  40 mg Intravenous Daily  . insulin aspart  0-5 Units Subcutaneous QHS  . insulin aspart  0-9 Units Subcutaneous TID WC  . ranolazine  500 mg Oral BID  . sodium chloride flush  3 mL Intravenous Once  . sotalol  40 mg Oral BID   Continuous Infusions: . cefTRIAXone (ROCEPHIN)  IV 2 g (02/17/20 2128)     LOS: 1 day   Time spent: 35 minutes.  Patrecia Pour, MD [facility] [email or web address] 02/18/2020, 2:44 PM

## 2020-02-18 NOTE — Consult Note (Signed)
NAME:  [name], MRN:  [id number], DOB:  12-Sep-1935, LOS: 1 ADMISSION DATE:  02/16/2020, CONSULTATION DATE:  02/18/2020 REFERRING MD:  Dr. Bonner Puna, [facility], CHIEF COMPLAINT:  Short of breath   Brief History   85 yo female with hx of chronic hypoxic/hypercapnic respiratory failure from sleep disordered breathing, restrictive lung disease from kyphoscoliosis, and asthma presented to ER with worsening shortness of breath and hypoxia from heart failure and possible pneumonia.  History of present illness   She was in usual state of health until about one week ago.  She was supervising yard work and running several errands.  She started getting chest congestion and feeling like she couldn't get air into her lungs.  She went out to lunch, but then was so fatigued she couldn't get out of her car and wasn't sure she could even drive herself.  She eventually made it home but then had to come to the hospital.  Her SpO2 was in the 50's.  She was started on supplemental oxygen, antibiotics, and lasix.  She reports some improvement, but still feels very weak.  Past Medical History  A fib, diastolic CHF, Osteoporosis, PAD, HTN, HLD, Breast cancer, Diverticulitis, DM type 2, CAD, CKD 2, Iron deficiency anemia  Significant Hospital Events   5/14 admit  Consults:    Procedures:    Significant Diagnostic Tests:   PFT 02/04/17 >> FEV1 0.73 (45%), FEV1% 77, TLC 2.84 (63%), DLCO 45%, + BD  CT chest 02/05/17 >> atherosclerosis, s/p CABG, mild pleural thickening b/l, 4 mm RLL nodule, healed rib fx's, kyphosis, 2.6 cm low density lesion in pancreatic tail  PSG 04/13/17 >>RDI 33.9. Test done with 2 liters oxygen.  Echo 02/17/20 >> EF 60 to 65%, mod LVH, grade 2 DD, RVSP 60.4 mmHg, mod LA dilation, mild MR, mild/mod TR  Micro Data:  SARS CoV2 PCR 5/14 >> negative  Antimicrobials:  Rocephin 5/14 >> Zithromax 5/14 >>   Interim history/subjective:    Objective   Blood pressure (!) 111/96, pulse 77,  temperature 98.2 F (36.8 C), resp. rate 19, height 5\' 2"  (1.575 m), weight 54 kg, SpO2 91 %.        Intake/Output Summary (Last 24 hours) at 02/18/2020 1404 Last data filed at 02/18/2020 0400 Gross per 24 hour  Intake 275.01 ml  Output --  Net 275.01 ml   Filed Weights   02/17/20 2016  Weight: 54 kg    Examination:  General - alert Eyes - pupils reactive ENT - no sinus tenderness, no stridor Cardiac - regular rate/rhythm, no murmur Chest - kyphotic, basilar rales, no wheeze Abdomen - soft, non tender, + bowel sounds Extremities - no cyanosis, clubbing, or edema Skin - no rashes Neuro - normal strength, moves extremities, follows commands Psych - normal mood and behavior Lymphatics - no lymphadenopathy  Resolved Hospital Problem list     Assessment & Plan:   Acute on chronic hypoxic respiratory failure. - combination of diastolic CHF and pneumonia in setting of restrictive lung disease and sleep disordered breathing - goal SpO2 90 to 95%  Community acquired pneumonia. - day 3 of ABx - f/u portable CXR 5/17  Acute on chronic diastolic CHF. - continue lasix  Asthma w/o exacerbation. - pulmicort with prn albuterol  Sleep disordered breathing. - CPAP 12 cm H2O qhs  Best practice:  Diet: carb modified DVT prophylaxis: lovenox GI prophylaxis: not indicated Mobility: OOB to chair as tolerated Code Status: full code Family Communication: updated daughter at bedside  Disposition: telemetry  Labs   CBC: Recent Labs  Lab 02/16/20 1814 02/17/20 0450 02/18/20 0231  WBC 10.9* 13.4* 13.5*  HGB 10.2* 10.1* 11.5*  HCT 34.1* 34.1* 39.1  MCV 96.9 98.0 98.2  PLT 249 251 [id number]    Basic Metabolic Panel: Recent Labs  Lab 02/16/20 1209 02/16/20 1814 02/17/20 0450 02/18/20 0231  NA 142 139 139 141  K 5.9* 4.9 4.4 4.6  CL 97 96* 99 97*  CO2 32* 34* 29 34*  GLUCOSE 276* 187* 135* 83  BUN 26 27* 21 22  CREATININE 1.11* 1.10* 0.99 1.11*  CALCIUM 10.0 9.5 9.0  9.5   GFR: Estimated Creatinine Clearance: 30.9 mL/min (A) (by C-G formula based on SCr of 1.11 mg/dL (H)). Recent Labs  Lab 02/16/20 1814 02/17/20 0102 02/17/20 0450 02/18/20 0231  PROCALCITON  --  <0.10  --   --   WBC 10.9*  --  13.4* 13.5*    Liver Function Tests: No results for input(s): AST, ALT, ALKPHOS, BILITOT, PROT, ALBUMIN in the last 168 hours. No results for input(s): LIPASE, AMYLASE in the last 168 hours. No results for input(s): AMMONIA in the last 168 hours.  ABG    Component Value Date/Time   PHART 7.204 (L) 11/21/2016 1600   PCO2ART 85.4 (HH) 11/21/2016 1600   PO2ART 73.0 (L) 11/21/2016 1600   HCO3 33.4 (H) 11/23/2016 0154   TCO2 23 12/05/2010 0945   ACIDBASEDEF 2.0 12/05/2010 0945   O2SAT 83.4 11/23/2016 0154     Coagulation Profile: No results for input(s): INR, PROTIME in the last 168 hours.  Cardiac Enzymes: No results for input(s): CKTOTAL, CKMB, CKMBINDEX, TROPONINI in the last 168 hours.  HbA1C: Hemoglobin A1C  Date/Time Value Ref Range Status  01/11/2020 11:41 AM 7.3 (A) 4.0 - 5.6 % Final   Hgb A1c MFr Bld  Date/Time Value Ref Range Status  09/12/2019 12:35 PM 8.3 (H) 4.6 - 6.5 % Final    Comment:    Glycemic Control Guidelines for People with Diabetes:Non Diabetic:  <6%Goal of Therapy: <7%Additional Action Suggested:  >8%   04/24/2019 12:50 PM 8.0 (H) 4.6 - 6.5 % Final    Comment:    Glycemic Control Guidelines for People with Diabetes:Non Diabetic:  <6%Goal of Therapy: <7%Additional Action Suggested:  >8%     CBG: Recent Labs  Lab 02/17/20 1514 02/17/20 2123 02/18/20 0740 02/18/20 1037 02/18/20 1207  GLUCAP 119* 114* 61* 100* 128*    Review of Systems:   Reviewed and negative  Past Medical History  She,  has a past medical history of Anemia, iron deficiency, Atrial fibrillation ([place]) (11/28/2010), Cancer ([place]), CAP (community acquired pneumonia), Chronic kidney disease (CKD), stage II (mild), Coronary artery disease,  Diabetes mellitus, type 2 ([place]), Diverticulitis, History of breast cancer, Hyperlipemia, Hypertension, Nontraumatic retroperitoneal hematoma, Osteoporosis, and Peripheral vascular disease ([place]).   Surgical History    Past Surgical History:  Procedure Laterality Date  . APPENDECTOMY  1962  . BREAST SURGERY  02/01   Quadrectomy Breast cancer (Dr. Hassell Done)  . CARDIAC CATHETERIZATION  2000   Iliac Stent (Dr. Juanita Craver) yearly follow-up  . CARDIAC CATHETERIZATION  01/25/02   High grade LAD disease, total of 3 vess, involvement  . CARDIAC CATHETERIZATION  01/13/07   SV graft to diag. occluded but good backfill o/w ok  . CARDIAC CATHETERIZATION  02/14/10   PTCA & stent vein graft to OM/RCA ([place])  . CARDIAC CATHETERIZATION  04/17/2011   Saphenous vein grafts to the RCA 100%  occluded, to the OM 100% occluded, and the diagonal 100% occluded. Internal mammary artery to the LAD, widely patent.  [facility] CATARACT EXTRACTION  10/10 and 02/11  . COLOSTOMY    . CORONARY ARTERY BYPASS GRAFT  02/09/02   X 4 ([place])  . DILATION AND CURETTAGE OF UTERUS  1971    miscarrage, abnormal bleeding  . DILATION AND CURETTAGE OF UTERUS  1995   Menorrhagia  . HEMORRHOID SURGERY  09/11/04   Binding Hassell Done)  . PARTIAL COLECTOMY    . SHOULDER ARTHROSCOPY  05/04   Right, with rotator cuff debridement     Social History   reports that she has never smoked. She has never used smokeless tobacco. She reports that she does not drink alcohol or use drugs.   Family History   Her family history includes Breast cancer in her maternal aunt; COPD in her brother; Diabetes in her mother and son; Gout in her brother; Heart disease in her brother, brother, and mother; Heart disease (age of onset: 41) in her father; Hyperlipidemia in her brother; Hypertension in her brother, brother, brother, father, mother, and son; Obesity in her brother. There is no history of Colon cancer.   Allergies Allergies  Allergen Reactions  . Actos  [Pioglitazone] Other (See Comments)    edema  . Metformin And Related Other (See Comments)    Held due to renal function  . Zocor [Simvastatin] Other (See Comments)    Myalgias     Home Medications  Prior to Admission medications   Medication Sig Start Date End Date Taking? Authorizing Provider  albuterol (VENTOLIN HFA) 108 (90 Base) MCG/ACT inhaler Inhale 2 puffs into the lungs every 4 (four) hours as needed for wheezing or shortness of breath. 02/08/20  Yes Martyn Ehrich, NP  amLODipine (NORVASC) 10 MG tablet TAKE ONE TABLET (10 MG) BY MOUTH DAILY. Patient taking differently: Take 10 mg by mouth daily.  08/11/19  Yes Croitoru, Mihai, MD  aspirin EC 81 MG tablet Take 81 mg by mouth at bedtime.    Yes [provider]  atorvastatin (LIPITOR) 80 MG tablet Take 1 tablet (80 mg total) by mouth daily at 6 PM. 05/18/19  Yes Croitoru, Mihai, MD  benazepril (LOTENSIN) 20 MG tablet Take 1 tablet (20 mg total) by mouth daily. Patient taking differently: Take 10 mg by mouth daily.  08/24/19  Yes Croitoru, Mihai, MD  Cholecalciferol (VITAMIN D) 1000 UNITS capsule Take 1,000 Units by mouth 2 (two) times daily.     Yes [provider]  FLOVENT HFA 44 MCG/ACT inhaler TAKE 2 PUFFS BY MOUTH TWICE A DAY Patient taking differently: Inhale 2 puffs into the lungs in the morning and at bedtime.  12/28/19  Yes Chesley Mires, MD  fluticasone (FLONASE) 50 MCG/ACT nasal spray Place 1 spray into both nostrils daily. 02/08/20  Yes Martyn Ehrich, NP  glyBURIDE (DIABETA) 5 MG tablet TAKE 2 TABLETS BY MOUTH DAILY WITH BREAKFAST. Patient taking differently: Take 10 mg by mouth daily with breakfast.  09/19/19  Yes Tonia Ghent, MD  insulin aspart (NOVOLOG) 100 UNIT/ML injection USE PER SLIDING SCALE--200-250=4-5 UNITS, 251-300=5-6 UNITS,301-350=6-7 UNITS, WITH MEALS Patient taking differently: Inject 4-7 Units into the skin See admin instructions. USE PER SLIDING SCALE--200-250=4-5 UNITS, 251-300=5-6  UNITS,301-350=6-7 UNITS, WITH MEALS 12/14/19  Yes Tonia Ghent, MD  meclizine (ANTIVERT) 25 MG tablet Take 1 tablet (25 mg total) by mouth daily as needed. For dizziness 01/03/15  Yes Tonia Ghent, MD  nitroGLYCERIN (  NITROSTAT) 0.4 MG SL tablet Place 1 tablet (0.4 mg total) under the tongue as needed for chest pain. As directed 01/19/19  Yes Tonia Ghent, MD  Polyvinyl Alcohol-Povidone (REFRESH OP) Place 1 drop into both eyes daily as needed (for dry eyes).    Yes [provider]  ranolazine (RANEXA) 500 MG 12 hr tablet Take 1 tablet (500 mg total) by mouth 2 (two) times daily. TAKE 1 TABLET BY MOUTH TWICE A DAY Patient taking differently: Take 500 mg by mouth 2 (two) times daily.  08/24/19  Yes Croitoru, Mihai, MD  sotalol (BETAPACE) 80 MG tablet TAKE 0.5 TABLETS (40 MG TOTAL) BY MOUTH 2 (TWO) TIMES DAILY. 08/25/19  Yes Croitoru, Mihai, MD  BD INSULIN SYRINGE U/F 31G X 5/16" 0.3 ML MISC USE DAILY AND AS DIRECTED SLIDING SCALE. MULTIPLE INJECTIONS OF INSULIN PER DAY Patient taking differently: 1 each by Other route See admin instructions. USE DAILY AND AS DIRECTED SLIDING SCALE.  Multiple injections of insulin per day 01/16/19   Tonia Ghent, MD  NON FORMULARY 2 L by Continuous infusion (non-IV) route continuous.     [provider]  ONETOUCH ULTRA test strip USE TO CHECK BLOOD SUGAR 4 TIMES A DAY. INSULIN-DEPENDENT. DIAGNOSIS: E11.9 Patient taking differently: 1 each by Other route in the morning, at noon, in the evening, and at bedtime.  11/13/19   Tonia Ghent, MD     Signature:  Chesley Mires, MD [place] Pager - [phone] 02/18/2020, 2:58 PM

## 2020-02-19 ENCOUNTER — Inpatient Hospital Stay (HOSPITAL_COMMUNITY): Payer: Medicare Other

## 2020-02-19 DIAGNOSIS — I5032 Chronic diastolic (congestive) heart failure: Secondary | ICD-10-CM

## 2020-02-19 DIAGNOSIS — I10 Essential (primary) hypertension: Secondary | ICD-10-CM

## 2020-02-19 DIAGNOSIS — E1159 Type 2 diabetes mellitus with other circulatory complications: Secondary | ICD-10-CM

## 2020-02-19 DIAGNOSIS — G9340 Encephalopathy, unspecified: Secondary | ICD-10-CM

## 2020-02-19 DIAGNOSIS — J9612 Chronic respiratory failure with hypercapnia: Secondary | ICD-10-CM

## 2020-02-19 DIAGNOSIS — J9622 Acute and chronic respiratory failure with hypercapnia: Secondary | ICD-10-CM

## 2020-02-19 DIAGNOSIS — J9611 Chronic respiratory failure with hypoxia: Secondary | ICD-10-CM

## 2020-02-19 LAB — COMPREHENSIVE METABOLIC PANEL
ALT: 11 U/L (ref 0–44)
AST: 17 U/L (ref 15–41)
Albumin: 2.8 g/dL — ABNORMAL LOW (ref 3.5–5.0)
Alkaline Phosphatase: 54 U/L (ref 38–126)
Anion gap: 15 (ref 5–15)
BUN: 34 mg/dL — ABNORMAL HIGH (ref 8–23)
CO2: 31 mmol/L (ref 22–32)
Calcium: 8.7 mg/dL — ABNORMAL LOW (ref 8.9–10.3)
Chloride: 91 mmol/L — ABNORMAL LOW (ref 98–111)
Creatinine, Ser: 1.48 mg/dL — ABNORMAL HIGH (ref 0.44–1.00)
GFR calc Af Amer: 38 mL/min — ABNORMAL LOW (ref >60)
GFR calc non Af Amer: 33 mL/min — ABNORMAL LOW (ref >60)
Glucose, Bld: 390 mg/dL — ABNORMAL HIGH (ref 70–99)
Potassium: 5.4 mmol/L — ABNORMAL HIGH (ref 3.5–5.1)
Sodium: 137 mmol/L (ref 135–145)
Total Bilirubin: 0.9 mg/dL (ref 0.3–1.2)
Total Protein: 5.5 g/dL — ABNORMAL LOW (ref 6.5–8.1)

## 2020-02-19 LAB — POCT I-STAT 7, (LYTES, BLD GAS, ICA,H+H)
Acid-Base Excess: 8 mmol/L — ABNORMAL HIGH (ref 0.0–2.0)
Bicarbonate: 33 mmol/L — ABNORMAL HIGH (ref 20.0–28.0)
Calcium, Ion: 1.22 mmol/L (ref 1.15–1.40)
HCT: 31 % — ABNORMAL LOW (ref 36.0–46.0)
Hemoglobin: 10.5 g/dL — ABNORMAL LOW (ref 12.0–15.0)
O2 Saturation: 97 %
Patient temperature: 98.6
Potassium: 4.4 mmol/L (ref 3.5–5.1)
Sodium: 137 mmol/L (ref 135–145)
TCO2: 34 mmol/L — ABNORMAL HIGH (ref 22–32)
pCO2 arterial: 48.4 mmHg — ABNORMAL HIGH (ref 32.0–48.0)
pH, Arterial: 7.442 (ref 7.350–7.450)
pO2, Arterial: 92 mmHg (ref 83.0–108.0)

## 2020-02-19 LAB — BASIC METABOLIC PANEL
Anion gap: 15 (ref 5–15)
BUN: 33 mg/dL — ABNORMAL HIGH (ref 8–23)
CO2: 32 mmol/L (ref 22–32)
Calcium: 9.2 mg/dL (ref 8.9–10.3)
Chloride: 92 mmol/L — ABNORMAL LOW (ref 98–111)
Creatinine, Ser: 1.47 mg/dL — ABNORMAL HIGH (ref 0.44–1.00)
GFR calc Af Amer: 38 mL/min — ABNORMAL LOW (ref >60)
GFR calc non Af Amer: 33 mL/min — ABNORMAL LOW (ref >60)
Glucose, Bld: 175 mg/dL — ABNORMAL HIGH (ref 70–99)
Potassium: 5.7 mmol/L — ABNORMAL HIGH (ref 3.5–5.1)
Sodium: 139 mmol/L (ref 135–145)

## 2020-02-19 LAB — CBC
HCT: 30.4 % — ABNORMAL LOW (ref 36.0–46.0)
Hemoglobin: 9.1 g/dL — ABNORMAL LOW (ref 12.0–15.0)
MCH: 29.1 pg (ref 26.0–34.0)
MCHC: 29.9 g/dL — ABNORMAL LOW (ref 30.0–36.0)
MCV: 97.1 fL (ref 80.0–100.0)
Platelets: 191 10*3/uL (ref 150–400)
RBC: 3.13 MIL/uL — ABNORMAL LOW (ref 3.87–5.11)
RDW: 14.9 % (ref 11.5–15.5)
WBC: 11.8 10*3/uL — ABNORMAL HIGH (ref 4.0–10.5)
nRBC: 1.1 % — ABNORMAL HIGH (ref 0.0–0.2)

## 2020-02-19 LAB — BLOOD GAS, ARTERIAL
Acid-Base Excess: 7.4 mmol/L — ABNORMAL HIGH (ref 0.0–2.0)
Bicarbonate: 34.9 mmol/L — ABNORMAL HIGH (ref 20.0–28.0)
Drawn by: 362771
FIO2: 100
O2 Saturation: 99.5 %
Patient temperature: 36
pCO2 arterial: 84.6 mmHg (ref 32.0–48.0)
pH, Arterial: 7.232 — ABNORMAL LOW (ref 7.350–7.450)
pO2, Arterial: 254 mmHg — ABNORMAL HIGH (ref 83.0–108.0)

## 2020-02-19 LAB — PHOSPHORUS: Phosphorus: 5 mg/dL — ABNORMAL HIGH (ref 2.5–4.6)

## 2020-02-19 LAB — TROPONIN I (HIGH SENSITIVITY): Troponin I (High Sensitivity): 12 ng/L (ref <18)

## 2020-02-19 LAB — MAGNESIUM: Magnesium: 1.5 mg/dL — ABNORMAL LOW (ref 1.7–2.4)

## 2020-02-19 LAB — GLUCOSE, CAPILLARY
Glucose-Capillary: 235 mg/dL — ABNORMAL HIGH (ref 70–99)
Glucose-Capillary: 195 mg/dL — ABNORMAL HIGH (ref 70–99)
Glucose-Capillary: 105 mg/dL — ABNORMAL HIGH (ref 70–99)
Glucose-Capillary: 33 mg/dL — CL (ref 70–99)
Glucose-Capillary: 131 mg/dL — ABNORMAL HIGH (ref 70–99)
Glucose-Capillary: 92 mg/dL (ref 70–99)
Glucose-Capillary: 82 mg/dL (ref 70–99)

## 2020-02-19 LAB — LACTIC ACID, PLASMA
Lactic Acid, Venous: 1.8 mmol/L (ref 0.5–1.9)
Lactic Acid, Venous: 2.3 mmol/L (ref 0.5–1.9)

## 2020-02-19 LAB — MRSA PCR SCREENING: MRSA by PCR: NEGATIVE

## 2020-02-19 LAB — BRAIN NATRIURETIC PEPTIDE: B Natriuretic Peptide: 437 pg/mL — ABNORMAL HIGH (ref 0.0–100.0)

## 2020-02-19 LAB — TRIGLYCERIDES: Triglycerides: 116 mg/dL (ref <150)

## 2020-02-19 MED ORDER — INSULIN ASPART 100 UNIT/ML IV SOLN
5.0000 [IU] | Freq: Once | INTRAVENOUS | Status: AC
  Administered 2020-02-19: 5 [IU] via INTRAVENOUS

## 2020-02-19 MED ORDER — FENTANYL 2500MCG IN NS 250ML (10MCG/ML) PREMIX INFUSION
25.0000 ug/h | INTRAVENOUS | Status: DC
  Administered 2020-02-19: 25 ug/h via INTRAVENOUS
  Filled 2020-02-19: qty 250

## 2020-02-19 MED ORDER — FUROSEMIDE 10 MG/ML IJ SOLN
40.0000 mg | Freq: Once | INTRAMUSCULAR | Status: AC
  Administered 2020-02-19: 40 mg via INTRAVENOUS
  Filled 2020-02-19: qty 4

## 2020-02-19 MED ORDER — FENTANYL CITRATE (PF) 100 MCG/2ML IJ SOLN
INTRAMUSCULAR | Status: AC
  Administered 2020-02-19: 50 ug
  Filled 2020-02-19: qty 2

## 2020-02-19 MED ORDER — AMLODIPINE BESYLATE 10 MG PO TABS
10.0000 mg | ORAL_TABLET | Freq: Every day | ORAL | Status: DC
  Administered 2020-02-19: 10 mg

## 2020-02-19 MED ORDER — ORAL CARE MOUTH RINSE
15.0000 mL | OROMUCOSAL | Status: DC
  Administered 2020-02-19 – 2020-02-20 (×8): 15 mL via OROMUCOSAL

## 2020-02-19 MED ORDER — FAMOTIDINE IN NACL 20-0.9 MG/50ML-% IV SOLN
20.0000 mg | INTRAVENOUS | Status: DC
  Administered 2020-02-19 – 2020-02-23 (×5): 20 mg via INTRAVENOUS
  Filled 2020-02-19 (×6): qty 50

## 2020-02-19 MED ORDER — PROPOFOL 1000 MG/100ML IV EMUL
5.0000 ug/kg/min | INTRAVENOUS | Status: DC
  Administered 2020-02-19: 5 ug/kg/min via INTRAVENOUS
  Filled 2020-02-19: qty 100

## 2020-02-19 MED ORDER — ENOXAPARIN SODIUM 40 MG/0.4ML ~~LOC~~ SOLN
30.0000 mg | SUBCUTANEOUS | Status: DC
  Administered 2020-02-19 – 2020-02-24 (×6): 30 mg via SUBCUTANEOUS
  Filled 2020-02-19 (×6): qty 0.4

## 2020-02-19 MED ORDER — CHLORHEXIDINE GLUCONATE 0.12% ORAL RINSE (MEDLINE KIT)
15.0000 mL | Freq: Two times a day (BID) | OROMUCOSAL | Status: DC
  Administered 2020-02-19 (×2): 15 mL via OROMUCOSAL

## 2020-02-19 MED ORDER — DEXTROSE 50 % IV SOLN
INTRAVENOUS | Status: AC
  Administered 2020-02-19: 25 g via INTRAVENOUS
  Filled 2020-02-19: qty 50

## 2020-02-19 MED ORDER — MIDAZOLAM HCL 2 MG/2ML IJ SOLN
INTRAMUSCULAR | Status: AC
  Administered 2020-02-19: 1 mg
  Filled 2020-02-19: qty 2

## 2020-02-19 MED ORDER — DEXTROSE 50 % IV SOLN: 25.0000 g | INTRAVENOUS | Status: AC

## 2020-02-19 MED ORDER — FENTANYL CITRATE (PF) 100 MCG/2ML IJ SOLN: 25.0000 ug | Freq: Once | INTRAMUSCULAR | Status: DC

## 2020-02-19 MED ORDER — BENAZEPRIL HCL 5 MG PO TABS
10.0000 mg | ORAL_TABLET | Freq: Every day | ORAL | Status: DC
  Administered 2020-02-19: 10 mg
  Filled 2020-02-19 (×2): qty 2

## 2020-02-19 MED ORDER — DEXTROSE 50 % IV SOLN
1.0000 | Freq: Once | INTRAVENOUS | Status: AC
  Administered 2020-02-19: 50 mL via INTRAVENOUS
  Filled 2020-02-19: qty 50

## 2020-02-19 MED ORDER — MIDAZOLAM HCL 2 MG/2ML IJ SOLN
1.0000 mg | INTRAMUSCULAR | Status: DC | PRN
  Filled 2020-02-19: qty 2

## 2020-02-19 MED ORDER — ASPIRIN 81 MG PO CHEW
81.0000 mg | CHEWABLE_TABLET | Freq: Every day | ORAL | Status: DC
  Administered 2020-02-19: 81 mg
  Filled 2020-02-19: qty 1

## 2020-02-19 MED ORDER — SOTALOL HCL 80 MG PO TABS
40.0000 mg | ORAL_TABLET | Freq: Two times a day (BID) | ORAL | Status: DC
  Administered 2020-02-19 (×2): 40 mg
  Filled 2020-02-19 (×3): qty 0.5

## 2020-02-19 MED ORDER — ATORVASTATIN CALCIUM 40 MG PO TABS
80.0000 mg | ORAL_TABLET | Freq: Every day | ORAL | Status: DC
  Administered 2020-02-19: 80 mg
  Filled 2020-02-19: qty 2

## 2020-02-19 MED ORDER — CHLORHEXIDINE GLUCONATE CLOTH 2 % EX PADS
6.0000 | MEDICATED_PAD | Freq: Every day | CUTANEOUS | Status: DC
  Administered 2020-02-20: 6 via TOPICAL

## 2020-02-19 MED ORDER — POLYETHYLENE GLYCOL 3350 17 G PO PACK: 17.0000 g | PACK | Freq: Every day | ORAL | Status: DC

## 2020-02-19 MED ORDER — CALCIUM GLUCONATE-NACL 1-0.675 GM/50ML-% IV SOLN
1.0000 g | Freq: Once | INTRAVENOUS | Status: AC
  Administered 2020-02-19: 1000 mg via INTRAVENOUS
  Filled 2020-02-19: qty 50

## 2020-02-19 MED ORDER — ACETAMINOPHEN 325 MG PO TABS
650.0000 mg | ORAL_TABLET | Freq: Four times a day (QID) | ORAL | Status: DC | PRN
  Administered 2020-02-19: 650 mg via ORAL
  Filled 2020-02-19: qty 2

## 2020-02-19 MED ORDER — MIDAZOLAM HCL 2 MG/2ML IJ SOLN: 1.0000 mg | INTRAMUSCULAR | Status: DC | PRN

## 2020-02-19 MED ORDER — DEXTROSE-NACL 5-0.45 % IV SOLN: INTRAVENOUS | Status: DC

## 2020-02-19 MED ORDER — DOCUSATE SODIUM 50 MG/5ML PO LIQD
100.0000 mg | Freq: Two times a day (BID) | ORAL | Status: DC
  Administered 2020-02-19: 100 mg
  Filled 2020-02-19: qty 10

## 2020-02-19 MED ORDER — VITAMIN D 25 MCG (1000 UNIT) PO TABS
1000.0000 [IU] | ORAL_TABLET | Freq: Two times a day (BID) | ORAL | Status: DC
  Administered 2020-02-19 (×2): 1000 [IU]
  Filled 2020-02-19: qty 1

## 2020-02-19 MED ORDER — POLYETHYLENE GLYCOL 3350 17 G PO PACK
17.0000 g | PACK | Freq: Every day | ORAL | Status: DC
  Administered 2020-02-19: 17 g via ORAL
  Filled 2020-02-19: qty 1

## 2020-02-19 MED ORDER — MAGNESIUM SULFATE 2 GM/50ML IV SOLN
2.0000 g | Freq: Once | INTRAVENOUS | Status: AC
  Administered 2020-02-19: 2 g via INTRAVENOUS
  Filled 2020-02-19: qty 50

## 2020-02-19 MED ORDER — DOCUSATE SODIUM 50 MG/5ML PO LIQD
100.0000 mg | Freq: Two times a day (BID) | ORAL | Status: DC
  Administered 2020-02-19 (×2): 100 mg via ORAL
  Filled 2020-02-19 (×2): qty 10

## 2020-02-19 MED ORDER — AZITHROMYCIN 500 MG PO TABS
500.0000 mg | ORAL_TABLET | Freq: Every day | ORAL | Status: DC
  Administered 2020-02-19: 500 mg
  Filled 2020-02-19 (×2): qty 1

## 2020-02-19 MED ORDER — FENTANYL BOLUS VIA INFUSION
25.0000 ug | INTRAVENOUS | Status: DC | PRN
  Administered 2020-02-19 (×3): 25 ug via INTRAVENOUS
  Filled 2020-02-19: qty 25

## 2020-02-19 MED FILL — Perflutren Lipid Microsphere IV Susp 1.1 MG/ML: INTRAVENOUS | Qty: 10 | Status: AC

## 2020-02-19 MED FILL — Medication: Qty: 1 | Status: AC

## 2020-02-19 NOTE — Progress Notes (Signed)
planced pt on cpap for the night on auto mode and 4 lt bleedin FIO2.

## 2020-02-19 NOTE — Progress Notes (Signed)
NAME:  [name], MRN:  [id number], DOB:  July 21, 1935, LOS: 2 ADMISSION DATE:  02/16/2020, CONSULTATION DATE:  02/18/2020 REFERRING MD:  Dr. Bonner Puna, [facility], CHIEF COMPLAINT:  Short of breath   Brief History   85 yo female with hx of chronic hypoxic/hypercapnic respiratory failure from sleep disordered breathing, restrictive lung disease from kyphoscoliosis, and asthma presented to ER with worsening shortness of breath and hypoxia from heart failure and possible pneumonia.  Overnight on 5/17, pt apparently became somnolent, bradycardic and apneic, intubated on the floor and moved to intensive care.    Past Medical History  A fib, diastolic CHF, Osteoporosis, PAD, HTN, HLD, Breast cancer, Diverticulitis, DM type 2, CAD, CKD 2, Iron deficiency anemia  Significant Hospital Events   5/14 admit 5/17 Intubated and transferred to ICU  Consults:  PCCM  Procedures:  5/17 ETT  Significant Diagnostic Tests:   PFT 02/04/17 >> FEV1 0.73 (45%), FEV1% 77, TLC 2.84 (63%), DLCO 45%, + BD  CT chest 02/05/17 >> atherosclerosis, s/p CABG, mild pleural thickening b/l, 4 mm RLL nodule, healed rib fx's, kyphosis, 2.6 cm low density lesion in pancreatic tail  PSG 04/13/17 >>RDI 33.9. Test done with 2 liters oxygen.  Echo 02/17/20 >> EF 60 to 65%, mod LVH, grade 2 DD, RVSP 60.4 mmHg, mod LA dilation, mild MR, mild/mod TR  Micro Data:  SARS CoV2 PCR 5/14 >> negative  Antimicrobials:  Rocephin 5/14 >> Zithromax 5/14 >>   Interim history/subjective:  Weaning sedation 5/17 and attempting SBT   Objective   Blood pressure 126/61, pulse 72, temperature 99.2 F (37.3 C), temperature source Axillary, resp. rate 20, height 5\' 2"  (1.575 m), weight 52.6 kg, SpO2 100 %.    Vent Mode: PRVC FiO2 (%):  [60 %] 60 % Set Rate:  [16 bmp-20 bmp] 20 bmp Vt Set:  [400 mL] 400 mL PEEP:  [5 cmH20] 5 cmH20 Plateau Pressure:  [19 cmH20-24 cmH20] 19 cmH20   Intake/Output Summary (Last 24 hours) at 02/19/2020 0810 Last  data filed at 02/19/2020 0700 Gross per 24 hour  Intake 215.23 ml  Output 600 ml  Net -384.77 ml   Filed Weights   02/17/20 2016 02/19/20 0047 02/19/20 0443  Weight: 54 kg 52.6 kg 52.6 kg   General: Elderly, frail, chronically ill appearing F, intubated and sedated NAD  HEENT: NCAT pink mmm, bite block + ETT secure. Trachea midline. Anicteric sclera  Neuro: Awakens to voice, Intermittently follows commands. PERRL 38mm. CV: RRR s1s2 no rgm. Cap refill < 3 seconds  PULM: Symmetrical chest expansion. No accessory muscle use on PSV/CPAP. Bilateral crackles  GI: Round, LLQ distension with formed brown output in colostomy. Non-tender.  Extremities: No obvious joint deformity, no extremity edema, no cyanosis or clubbing  Skin: c/d/w without rash   Resolved Hospital Problem list   Bradycardia  Assessment & Plan:   Acute encephalopathy, likely in setting of hypercarbia and hypoxia-- improving -mentation slowly improving with improvement in gas exchange. Remains somnolent and inconsistent with commands however P: -CT H -wean sedation as able, with AKI may take longer for clearance of sedation  -RASS goal 0   Acute on chronic hypoxic and hypercarbic respiratory failure in setting of acute HFpEF exacerbation, CAP, underlying restrictive lung disease, sleep disordered breathing -hx kyphoscoliosis may compound restrictive dz -Bradycardic and apneic episode  of unclear etiology, ABG 7.2/84.6 improving after intubation.  Patient had been on CPAP until right before this event, consider hypercarbic respiratory failure versus seizure versus central neurologic  issue.   P: -Weaning MV support 5/17, if unable to extubate continue qD WUA/SBT -PAD/VAP bundles  -Continue empiric rocephin and azithromycin  -Continue BDs   Leukocytosis, mild -suspect reactive P: -empiric abx as above -trend fever, WBC  Acute on chronic diastolic CHF -Echo [id number] with EF 60 to 65% and grade 2 diastolic dysfunction,  severely elevated pulmonary artery systolic pressure -BNP [id number] P: -Lasix 40mg  qD  Hx CAD Hx HTN P: -ICU monitoring  - norvasc, lotensin, tanexa, sotalol, ASA 81 mg  AKI -Creatinine  up trending from 1.1 on admission to 1.48 P: -Trend UOP, renal indices  -minimize nephrotoxins as able   Hypomagnesemia Hyperkalemia, improved  -given IV insulin 5 units and ordered EKG P: -Mag 1.5, give 2 g IV x1 along with calcium gluconate 1 g  DM2  P -SSI ACHS     Best practice:  Diet: N.p.o. DVT prophylaxis: lovenox GI prophylaxis: not indicated Mobility: BR to Bed in chair position Code Status: full code Family Communication: pending 5/17 Disposition: ICU  Labs   CBC: Recent Labs  Lab 02/16/20 1814 02/17/20 0450 02/18/20 0231 02/19/20 0142 02/19/20 0202  WBC 10.9* 13.4* 13.5* 11.8*  --   HGB 10.2* 10.1* 11.5* 9.1* 10.5*  HCT 34.1* 34.1* 39.1 30.4* 31.0*  MCV 96.9 98.0 98.2 97.1  --   PLT 249 251 253 191  --     Basic Metabolic Panel: Recent Labs  Lab 02/16/20 1814 02/16/20 1814 02/17/20 0450 02/18/20 0231 02/19/20 0030 02/19/20 0142 02/19/20 0202  NA 139   < > 139 141 139 137 137  K 4.9   < > 4.4 4.6 5.7* 5.4* 4.4  CL 96*  --  99 97* 92* 91*  --   CO2 34*  --  29 34* 32 31  --   GLUCOSE 187*  --  135* 83 175* 390*  --   BUN 27*  --  21 22 33* 34*  --   CREATININE 1.10*  --  0.99 1.11* 1.47* 1.48*  --   CALCIUM 9.5  --  9.0 9.5 9.2 8.7*  --   MG  --   --   --   --   --  1.5*  --   PHOS  --   --   --   --   --  5.0*  --    < > = values in this interval not displayed.   GFR: Estimated Creatinine Clearance: 23.2 mL/min (A) (by C-G formula based on SCr of 1.48 mg/dL (H)). Recent Labs  Lab 02/16/20 1814 02/17/20 0102 02/17/20 0450 02/18/20 0231 02/19/20 0142 02/19/20 0419  PROCALCITON  --  <0.10  --   --   --   --   WBC 10.9*  --  13.4* 13.5* 11.8*  --   LATICACIDVEN  --   --   --   --  1.8 2.3*    Liver Function Tests: Recent Labs  Lab 02/19/20  0142  AST 17  ALT 11  ALKPHOS 54  BILITOT 0.9  PROT 5.5*  ALBUMIN 2.8*   No results for input(s): LIPASE, AMYLASE in the last 168 hours. No results for input(s): AMMONIA in the last 168 hours.  ABG    Component Value Date/Time   PHART 7.442 02/19/2020 0202   PCO2ART 48.4 (H) 02/19/2020 0202   PO2ART 92 02/19/2020 0202   HCO3 33.0 (H) 02/19/2020 0202   TCO2 34 (H) 02/19/2020 0202   ACIDBASEDEF 2.0 12/05/2010 0945  O2SAT 97.0 02/19/2020 0202     Coagulation Profile: No results for input(s): INR, PROTIME in the last 168 hours.  Cardiac Enzymes: No results for input(s): CKTOTAL, CKMB, CKMBINDEX, TROPONINI in the last 168 hours.  HbA1C: Hemoglobin A1C  Date/Time Value Ref Range Status  01/11/2020 11:41 AM 7.3 (A) 4.0 - 5.6 % Final   Hgb A1c MFr Bld  Date/Time Value Ref Range Status  09/12/2019 12:35 PM 8.3 (H) 4.6 - 6.5 % Final    Comment:    Glycemic Control Guidelines for People with Diabetes:Non Diabetic:  <6%Goal of Therapy: <7%Additional Action Suggested:  >8%   04/24/2019 12:50 PM 8.0 (H) 4.6 - 6.5 % Final    Comment:    Glycemic Control Guidelines for People with Diabetes:Non Diabetic:  <6%Goal of Therapy: <7%Additional Action Suggested:  >8%     CBG: Recent Labs  Lab 02/18/20 1711 02/18/20 1817 02/18/20 2128 02/19/20 0440 02/19/20 0724  GLUCAP 73 118* 136* 235* 195*     CRITICAL CARE Performed by: Cristal Generous   Total critical care time: 45 minutes  Critical care time was exclusive of separately billable procedures and treating other patients.  Critical care was necessary to treat or prevent imminent or life-threatening deterioration.  Critical care was time spent personally by me on the following activities: development of treatment plan with patient and/or surrogate as well as nursing, discussions with consultants, evaluation of patient's response to treatment, examination of patient, obtaining history from patient or surrogate, ordering and  performing treatments and interventions, ordering and review of laboratory studies, ordering and review of radiographic studies, pulse oximetry and re-evaluation of patient's condition.   Eliseo Gum MSN, AGACNP-BC [place] [id number] If no answer, [id number] 02/19/2020, 8:54 AM

## 2020-02-19 NOTE — Consult Note (Signed)
NAME:  [name], MRN:  [id number], DOB:  [birth year], LOS: 2 ADMISSION DATE:  02/16/2020, CONSULTATION DATE:  02/18/2020 REFERRING MD:  Dr. Bonner Puna, [facility], CHIEF COMPLAINT:  Short of breath   Brief History   85 yo female with hx of chronic hypoxic/hypercapnic respiratory failure from sleep disordered breathing, restrictive lung disease from kyphoscoliosis, and asthma presented to ER with worsening shortness of breath and hypoxia from heart failure and possible pneumonia.  History of present illness   She was in usual state of health until about one week ago.  She was supervising yard work and running several errands.  She started getting chest congestion and feeling like she couldn't get air into her lungs.  She went out to lunch, but then was so fatigued she couldn't get out of her car and wasn't sure she could even drive herself.  She eventually made it home but then had to come to the hospital.  Her SpO2 was in the 50's.  She was started on supplemental oxygen, antibiotics, and lasix.  She reports some improvement, but still feels very weak.   Overnight on 5/17, pt apparently became somnolent, bradycardic and apneic, intubated on the floor and moved to intensive care.    Past Medical History  A fib, diastolic CHF, Osteoporosis, PAD, HTN, HLD, Breast cancer, Diverticulitis, DM type 2, CAD, CKD 2, Iron deficiency anemia  Significant Hospital Events   5/14 admit 5/17 Intubated and transferred to ICU  Consults:  PCCM  Procedures:  5/17 ETT  Significant Diagnostic Tests:   PFT 02/04/17 >> FEV1 0.73 (45%), FEV1% 77, TLC 2.84 (63%), DLCO 45%, + BD  CT chest 02/05/17 >> atherosclerosis, s/p CABG, mild pleural thickening b/l, 4 mm RLL nodule, healed rib fx's, kyphosis, 2.6 cm low density lesion in pancreatic tail  PSG 04/13/17 >>RDI 33.9. Test done with 2 liters oxygen.  Echo 02/17/20 >> EF 60 to 65%, mod LVH, grade 2 DD, RVSP 60.4 mmHg, mod LA dilation, mild MR, mild/mod TR  Micro  Data:  SARS CoV2 PCR 5/14 >> negative  Antimicrobials:  Rocephin 5/14 >> Zithromax 5/14 >>   Interim history/subjective:  Post-intubation pt's hypercarbia improved and pt starting to wake up  Objective   Blood pressure 112/66, pulse 82, temperature 98.5 F (36.9 C), temperature source Axillary, resp. rate (!) 21, height 5\' 2"  (1.575 m), weight 52.6 kg, SpO2 100 %.    Vent Mode: PRVC FiO2 (%):  [60 %] 60 % Set Rate:  [16 bmp-20 bmp] 20 bmp Vt Set:  [400 mL] 400 mL PEEP:  [5 cmH20] 5 cmH20 Plateau Pressure:  [24 cmH20] 24 cmH20   Intake/Output Summary (Last 24 hours) at 02/19/2020 0108 Last data filed at 02/18/2020 1509 Gross per 24 hour  Intake 215.01 ml  Output 450 ml  Net -234.99 ml   Filed Weights   02/17/20 2016 02/19/20 0047  Weight: 54 kg 52.6 kg   General:  Elderly F, moving extremities nonpurposefully, biting ET tube,  HEENT: MM pink/moist, pupils equal, however old reactive to light Neuro: Nonpurposeful movement, biting ET tube CV: s1s2 RRR, no m/r/g PULM: Crackles bilaterally GI: soft, bsx4 active  Extremities: warm/dry, no edema  Skin: no rashes or lesions   Resolved Hospital Problem list     Assessment & Plan:   Acute on chronic hypoxic and hypercapnic respiratory failure in the setting of HFpEF exacerbation and CAP with underlying restrictive lung disease -Bradycardic and apneic episode overnight of unclear etiology, ABG 7.2/84.6 improving after intubation.  Patient had been on CPAP until right before this event, consider hypercarbic respiratory failure versus seizure versus central neurologic issue.   P: -Check stat head CT, if mental status not improving consider neuro consult and EEG -Follow repeat ABG -Additional Lasix 40 mg -Stat labs -Continue Rocephin and azithromycin -Continue Pulmicort and as needed albuterol   Acute on chronic diastolic CHF -Echo [id number] with EF 60 to 65% and grade 2 diastolic dysfunction, severely elevated pulmonary artery  systolic pressure P: -Additional dose of Lasix 40 mg overnight then resume 40 mg daily  AKI -Creatinine  up trending from 1.1 on admission to 1.48 P: -K5.7, given IV insulin 5 units and ordered EKG -Mag 1.5, give 2 g IV x1 along with calcium gluconate 1 g -Follow renal function and avoid nephrotoxins  Best practice:  Diet: N.p.o. DVT prophylaxis: lovenox GI prophylaxis: not indicated Mobility: OOB to chair as tolerated Code Status: full code Family Communication: Pt's daughter Judeen Hammans updated Disposition: telemetry  Labs   CBC: Recent Labs  Lab 02/16/20 1814 02/17/20 0450 02/18/20 0231  WBC 10.9* 13.4* 13.5*  HGB 10.2* 10.1* 11.5*  HCT 34.1* 34.1* 39.1  MCV 96.9 98.0 98.2  PLT 249 251 [id number]    Basic Metabolic Panel: Recent Labs  Lab 02/16/20 1209 02/16/20 1814 02/17/20 0450 02/18/20 0231  NA 142 139 139 141  K 5.9* 4.9 4.4 4.6  CL 97 96* 99 97*  CO2 32* 34* 29 34*  GLUCOSE 276* 187* 135* 83  BUN 26 27* 21 22  CREATININE 1.11* 1.10* 0.99 1.11*  CALCIUM 10.0 9.5 9.0 9.5   GFR: Estimated Creatinine Clearance: 30.9 mL/min (A) (by C-G formula based on SCr of 1.11 mg/dL (H)). Recent Labs  Lab 02/16/20 1814 02/17/20 0102 02/17/20 0450 02/18/20 0231  PROCALCITON  --  <0.10  --   --   WBC 10.9*  --  13.4* 13.5*    Liver Function Tests: No results for input(s): AST, ALT, ALKPHOS, BILITOT, PROT, ALBUMIN in the last 168 hours. No results for input(s): LIPASE, AMYLASE in the last 168 hours. No results for input(s): AMMONIA in the last 168 hours.  ABG    Component Value Date/Time   PHART 7.232 (L) 02/19/2020 0022   PCO2ART 84.6 (HH) 02/19/2020 0022   PO2ART 254 (H) 02/19/2020 0022   HCO3 34.9 (H) 02/19/2020 0022   TCO2 23 12/05/2010 0945   ACIDBASEDEF 2.0 12/05/2010 0945   O2SAT 99.5 02/19/2020 0022     Coagulation Profile: No results for input(s): INR, PROTIME in the last 168 hours.  Cardiac Enzymes: No results for input(s): CKTOTAL, CKMB, CKMBINDEX,  TROPONINI in the last 168 hours.  HbA1C: Hemoglobin A1C  Date/Time Value Ref Range Status  01/11/2020 11:41 AM 7.3 (A) 4.0 - 5.6 % Final   Hgb A1c MFr Bld  Date/Time Value Ref Range Status  09/12/2019 12:35 PM 8.3 (H) 4.6 - 6.5 % Final    Comment:    Glycemic Control Guidelines for People with Diabetes:Non Diabetic:  <6%Goal of Therapy: <7%Additional Action Suggested:  >8%   04/24/2019 12:50 PM 8.0 (H) 4.6 - 6.5 % Final    Comment:    Glycemic Control Guidelines for People with Diabetes:Non Diabetic:  <6%Goal of Therapy: <7%Additional Action Suggested:  >8%     CBG: Recent Labs  Lab 02/18/20 1037 02/18/20 1207 02/18/20 1711 02/18/20 1817 02/18/20 2128  GLUCAP 100* 128* 73 118* 136*    Review of Systems:   Reviewed and negative  Past Medical History  She,  has a past medical history of Anemia, iron deficiency, Atrial fibrillation ([place]) (11/28/2010), Cancer ([place]), CAP (community acquired pneumonia), Chronic kidney disease (CKD), stage II (mild), Coronary artery disease, Diabetes mellitus, type 2 ([place]), Diverticulitis, History of breast cancer, Hyperlipemia, Hypertension, Nontraumatic retroperitoneal hematoma, Osteoporosis, and Peripheral vascular disease ([place]).   Surgical History    Past Surgical History:  Procedure Laterality Date  . APPENDECTOMY  1962  . BREAST SURGERY  02/01   Quadrectomy Breast cancer (Dr. Hassell Done)  . CARDIAC CATHETERIZATION  2000   Iliac Stent (Dr. Juanita Craver) yearly follow-up  . CARDIAC CATHETERIZATION  01/25/02   High grade LAD disease, total of 3 vess, involvement  . CARDIAC CATHETERIZATION  01/13/07   SV graft to diag. occluded but good backfill o/w ok  . CARDIAC CATHETERIZATION  02/14/10   PTCA & stent vein graft to OM/RCA ([place])  . CARDIAC CATHETERIZATION  04/17/2011   Saphenous vein grafts to the RCA 100% occluded, to the OM 100% occluded, and the diagonal 100% occluded. Internal mammary artery to the LAD, widely patent.  Marland Kitchen CATARACT EXTRACTION   10/10 and 02/11  . COLOSTOMY    . CORONARY ARTERY BYPASS GRAFT  02/09/02   X 4 ([place])  . DILATION AND CURETTAGE OF UTERUS  1971    miscarrage, abnormal bleeding  . DILATION AND CURETTAGE OF UTERUS  1995   Menorrhagia  . HEMORRHOID SURGERY  09/11/04   Binding Hassell Done)  . PARTIAL COLECTOMY    . SHOULDER ARTHROSCOPY  05/04   Right, with rotator cuff debridement     Social History   reports that she has never smoked. She has never used smokeless tobacco. She reports that she does not drink alcohol or use drugs.   Family History   Her family history includes Breast cancer in her maternal aunt; COPD in her brother; Diabetes in her mother and son; Gout in her brother; Heart disease in her brother, brother, and mother; Heart disease (age of onset: 30) in her father; Hyperlipidemia in her brother; Hypertension in her brother, brother, brother, father, mother, and son; Obesity in her brother. There is no history of Colon cancer.   Allergies Allergies  Allergen Reactions  . Actos [Pioglitazone] Other (See Comments)    edema  . Metformin And Related Other (See Comments)    Held due to renal function  . Zocor [Simvastatin] Other (See Comments)    Myalgias     Home Medications  Prior to Admission medications   Medication Sig Start Date End Date Taking? Authorizing Provider  albuterol (VENTOLIN HFA) 108 (90 Base) MCG/ACT inhaler Inhale 2 puffs into the lungs every 4 (four) hours as needed for wheezing or shortness of breath. 02/08/20  Yes Martyn Ehrich, NP  amLODipine (NORVASC) 10 MG tablet TAKE ONE TABLET (10 MG) BY MOUTH DAILY. Patient taking differently: Take 10 mg by mouth daily.  08/11/19  Yes Croitoru, Mihai, MD  aspirin EC 81 MG tablet Take 81 mg by mouth at bedtime.    Yes [provider]  atorvastatin (LIPITOR) 80 MG tablet Take 1 tablet (80 mg total) by mouth daily at 6 PM. 05/18/19  Yes Croitoru, Mihai, MD  benazepril (LOTENSIN) 20 MG tablet Take 1 tablet (20 mg  total) by mouth daily. Patient taking differently: Take 10 mg by mouth daily.  08/24/19  Yes Croitoru, Mihai, MD  Cholecalciferol (VITAMIN D) 1000 UNITS capsule Take 1,000 Units by mouth 2 (two) times daily.     Yes [provider]  FLOVENT HFA 44 MCG/ACT inhaler TAKE 2 PUFFS BY MOUTH TWICE A DAY Patient taking differently: Inhale 2 puffs into the lungs in the morning and at bedtime.  12/28/19  Yes Chesley Mires, MD  fluticasone (FLONASE) 50 MCG/ACT nasal spray Place 1 spray into both nostrils daily. 02/08/20  Yes Martyn Ehrich, NP  glyBURIDE (DIABETA) 5 MG tablet TAKE 2 TABLETS BY MOUTH DAILY WITH BREAKFAST. Patient taking differently: Take 10 mg by mouth daily with breakfast.  09/19/19  Yes Tonia Ghent, MD  insulin aspart (NOVOLOG) 100 UNIT/ML injection USE PER SLIDING SCALE--200-250=4-5 UNITS, 251-300=5-6 UNITS,301-350=6-7 UNITS, WITH MEALS Patient taking differently: Inject 4-7 Units into the skin See admin instructions. USE PER SLIDING SCALE--200-250=4-5 UNITS, 251-300=5-6 UNITS,301-350=6-7 UNITS, WITH MEALS 12/14/19  Yes Tonia Ghent, MD  meclizine (ANTIVERT) 25 MG tablet Take 1 tablet (25 mg total) by mouth daily as needed. For dizziness 01/03/15  Yes Tonia Ghent, MD  nitroGLYCERIN (NITROSTAT) 0.4 MG SL tablet Place 1 tablet (0.4 mg total) under the tongue as needed for chest pain. As directed 01/19/19  Yes Tonia Ghent, MD  Polyvinyl Alcohol-Povidone (REFRESH OP) Place 1 drop into both eyes daily as needed (for dry eyes).    Yes [provider]  ranolazine (RANEXA) 500 MG 12 hr tablet Take 1 tablet (500 mg total) by mouth 2 (two) times daily. TAKE 1 TABLET BY MOUTH TWICE A DAY Patient taking differently: Take 500 mg by mouth 2 (two) times daily.  08/24/19  Yes Croitoru, Mihai, MD  sotalol (BETAPACE) 80 MG tablet TAKE 0.5 TABLETS (40 MG TOTAL) BY MOUTH 2 (TWO) TIMES DAILY. 08/25/19  Yes Croitoru, Mihai, MD  BD INSULIN SYRINGE U/F 31G X 5/16" 0.3 ML MISC USE  DAILY AND AS DIRECTED SLIDING SCALE. MULTIPLE INJECTIONS OF INSULIN PER DAY Patient taking differently: 1 each by Other route See admin instructions. USE DAILY AND AS DIRECTED SLIDING SCALE.  Multiple injections of insulin per day 01/16/19   Tonia Ghent, MD  NON FORMULARY 2 L by Continuous infusion (non-IV) route continuous.     [provider]  ONETOUCH ULTRA test strip USE TO CHECK BLOOD SUGAR 4 TIMES A DAY. INSULIN-DEPENDENT. DIAGNOSIS: E11.9 Patient taking differently: 1 each by Other route in the morning, at noon, in the evening, and at bedtime.  11/13/19   Tonia Ghent, MD     CRITICAL CARE Performed by: Otilio Carpen Ramsay Bognar   Total critical care time: 50 minutes  Critical care time was exclusive of separately billable procedures and treating other patients.  Critical care was necessary to treat or prevent imminent or life-threatening deterioration.  Critical care was time spent personally by me on the following activities: development of treatment plan with patient and/or surrogate as well as nursing, discussions with consultants, evaluation of patient's response to treatment, examination of patient, obtaining history from patient or surrogate, ordering and performing treatments and interventions, ordering and review of laboratory studies, ordering and review of radiographic studies, pulse oximetry and re-evaluation of patient's condition.     Signature:   Otilio Carpen Donyell Ding, PA-C

## 2020-02-19 NOTE — Progress Notes (Signed)
Central Telemetry reported Pt HR 30 via Phone at 2354 10/21/19. Went to patient at bedside for observation and place pt on monitor HR 56 SPO2 46 apneic. Placed pt on 100% Non-Rebreather and manually bag at 100%. Called Rapid Response team 2356. Respiratory was called and MD was informed via [place]. Code blue was initiated at 2358. Patient was intubated and transferred to 2M11. Report given to [facility].

## 2020-02-19 NOTE — Progress Notes (Signed)
Patients wedding rings and watch was hand delivered to patient's daughter, Tal Friloux, to take home with her.  Dewaine Oats, RN

## 2020-02-19 NOTE — Progress Notes (Signed)
Per Dr. Patsey Berthold, can hold off on head CT for now since improvement in blood gas and pt waking up.

## 2020-02-19 NOTE — Procedures (Signed)
Intubation Procedure Note [name] [id number] September 26, 1935  Procedure: Intubation Indications: Respiratory insufficiency  Procedure Details Consent: Risks of procedure as well as the alternatives and risks of each were explained to the (patient/caregiver).  Consent for procedure obtained. and Unable to obtain consent because of altered level of consciousness. Time Out: Verified patient identification, verified procedure, site/side was marked, verified correct patient position, special equipment/implants available, medications/allergies/relevent history reviewed, required imaging and test results available.  Performed  Maximum sterile technique was used including gloves, hand hygiene and mask.  MAC    Evaluation Hemodynamic Status: BP stable throughout; O2 sats: stable throughout Patient's Current Condition: stable Complications: No apparent complications Patient did tolerate procedure well. Chest X-ray ordered to verify placement.  CXR: pending.   Levora Dredge 02/19/2020

## 2020-02-19 NOTE — Progress Notes (Signed)
OT Cancellation Note  Patient Details Name: SHADEN GULDAN MRN: [id number] DOB: [birth year]   Cancelled Treatment:    Reason Eval/Treat Not Completed: Medical issues which prohibited therapy. Pt coded early this morning, transferred to ICU and intubated. Will follow.  Malka So 02/19/2020, 8:45 AM  Nestor Lewandowsky, OTR/L Acute Rehabilitation Services Pager: [phone] Office: [phone]

## 2020-02-19 NOTE — Progress Notes (Signed)
PCCM Family Communication Note  Met with patient's adult children at bedside. Daily updates provided. Family wished to discuss code status. After discussion of pt's chronic medical problems, previously discussed preferences, a decision was reached to update patient's codes status to Limited Code Blue: No CPR, No CV/DF, no ACLS medications in event of cardiac arrest.   Continue current medical interventions. If pressors become indicated, discuss this again with family. Family places high priority in QOL and comfort, and if present illness course is not felt to be largely improvable they would wish to discuss [place].    P -LCB: No CPR, No DF/CV, No ACLS medications -Continue MV support and current medical treatment/scope     Additional Critical Care Time 17 minutes   Eliseo Gum MSN, AGACNP-BC [place] [phone] If no answer, [phone] 02/19/2020, 2:54 PM

## 2020-02-19 NOTE — Progress Notes (Signed)
eLink Physician-Brief Progress Note Patient Name: [name] DOB: 13-Feb-1935 MRN: [id number]   Date of Service  02/19/2020  HPI/Events of Note  34 F chronic hypercapneic and hypoxemic respiratory failure, kyphoscoliois, asthma, HFpEF, presented with shortness of breath. Now intubated after a bradycardic episode with desaturation and was noted to be apneic. She reportedly started to wake up after intubation Volume AC 20/400/60%/5 PEEP. CXR with increasing patchy opacities.  eICU Interventions  On antibiotics Bronchodilators and ICS Diurese as tolerated     Intervention Category Major Interventions: Respiratory failure - evaluation and management Evaluation Type: New Patient Evaluation  Judd Lien 02/19/2020, 2:29 AM

## 2020-02-19 NOTE — Progress Notes (Signed)
PT Cancellation Note  Patient Details Name: [name] MRN: [id number] DOB: March 19, 1935   Cancelled Treatment:    Reason Eval/Treat Not Completed: Patient not medically ready. Patient had code blue this morning. Transferred to ICU. Will continue to follow and evaluate when appropriate.   Jaelynn Pozo 02/19/2020, 8:53 AM

## 2020-02-19 NOTE — Progress Notes (Signed)
CRITICAL VALUE ALERT  Critical Value:  Lactic Acid=2.3  Date & Time Notied:  02/19/2020 0552  Provider Notified: [place] (elink)  Orders Received/Actions taken: awaiting orders

## 2020-02-19 NOTE — Progress Notes (Signed)
Inpatient Diabetes Program Recommendations  AACE/ADA: New Consensus Statement on Inpatient Glycemic Control   Target Ranges:  Prepandial:   less than 140 mg/dL      Peak postprandial:   less than 180 mg/dL (1-2 hours)      Critically ill patients:  140 - 180 mg/dL   Results for MARINN, HOHENBERGER (MRN [id number]) as of 02/19/2020 10:40  Ref. Range 02/18/2020 07:40 02/18/2020 10:37 02/18/2020 12:07 02/18/2020 17:11 02/18/2020 18:17 02/18/2020 21:28 02/19/2020 04:40 02/19/2020 07:24  Glucose-Capillary Latest Ref Range: 70 - 99 mg/dL 61 (L) 100 (H) 128 (H) 73 118 (H) 136 (H) 235 (H) 195 (H)   Review of Glycemic Control  Diabetes history: DM2 Outpatient Diabetes medications: Novolog 4-7 units with meals (per correction scale) Current orders for Inpatient glycemic control: Novolog 0-9 units TID with meals, Novolog 0-5 units QHS  Inpatient Diabetes Program Recommendations:   Correction (SSI): Please consider changing CBGs and Novolog correction to Q4H.  Thanks, Barnie Alderman, RN, MSN, CDE Diabetes Coordinator Inpatient Diabetes Program [phone] (Team Pager from 8am to 5pm)

## 2020-02-19 NOTE — Progress Notes (Signed)
Patient arrived to 2M11 via bed from 2W with rapid response.  Patient is now intubated and does not follow commands. Patient has two PIV's that are saline locked. Foley catheter placed with yellow, cloudy urine returned and OGT placed and clamped. CXR to confirm placement. Patients two yellow rings and grey watch placed in specimen cup with Patient ID and is at bedside. LLQ colostomy is in place and protruding. CCM aware. No family at bedside. MD to call family. Sacral foam placed. Awaiting new orders.

## 2020-02-20 ENCOUNTER — Other Ambulatory Visit: Payer: Medicare Other

## 2020-02-20 LAB — BASIC METABOLIC PANEL
Anion gap: 12 (ref 5–15)
BUN: 29 mg/dL — ABNORMAL HIGH (ref 8–23)
CO2: 34 mmol/L — ABNORMAL HIGH (ref 22–32)
Calcium: 9.1 mg/dL (ref 8.9–10.3)
Chloride: 93 mmol/L — ABNORMAL LOW (ref 98–111)
Creatinine, Ser: 1.35 mg/dL — ABNORMAL HIGH (ref 0.44–1.00)
GFR calc Af Amer: 42 mL/min — ABNORMAL LOW (ref >60)
GFR calc non Af Amer: 36 mL/min — ABNORMAL LOW (ref >60)
Glucose, Bld: 146 mg/dL — ABNORMAL HIGH (ref 70–99)
Potassium: 3.7 mmol/L (ref 3.5–5.1)
Sodium: 139 mmol/L (ref 135–145)

## 2020-02-20 LAB — CBC
HCT: 34.2 % — ABNORMAL LOW (ref 36.0–46.0)
Hemoglobin: 10.5 g/dL — ABNORMAL LOW (ref 12.0–15.0)
MCH: 28.8 pg (ref 26.0–34.0)
MCHC: 30.7 g/dL (ref 30.0–36.0)
MCV: 94 fL (ref 80.0–100.0)
Platelets: 224 10*3/uL (ref 150–400)
RBC: 3.64 MIL/uL — ABNORMAL LOW (ref 3.87–5.11)
RDW: 15.3 % (ref 11.5–15.5)
WBC: 22.6 10*3/uL — ABNORMAL HIGH (ref 4.0–10.5)
nRBC: 0.1 % (ref 0.0–0.2)

## 2020-02-20 LAB — GLUCOSE, CAPILLARY
Glucose-Capillary: 131 mg/dL — ABNORMAL HIGH (ref 70–99)
Glucose-Capillary: 54 mg/dL — ABNORMAL LOW (ref 70–99)
Glucose-Capillary: 57 mg/dL — ABNORMAL LOW (ref 70–99)
Glucose-Capillary: 116 mg/dL — ABNORMAL HIGH (ref 70–99)
Glucose-Capillary: 141 mg/dL — ABNORMAL HIGH (ref 70–99)
Glucose-Capillary: 117 mg/dL — ABNORMAL HIGH (ref 70–99)
Glucose-Capillary: 111 mg/dL — ABNORMAL HIGH (ref 70–99)

## 2020-02-20 LAB — MAGNESIUM: Magnesium: 1.8 mg/dL (ref 1.7–2.4)

## 2020-02-20 MED ORDER — ORAL CARE MOUTH RINSE
15.0000 mL | Freq: Two times a day (BID) | OROMUCOSAL | Status: DC
  Administered 2020-02-20 – 2020-02-24 (×7): 15 mL via OROMUCOSAL

## 2020-02-20 MED ORDER — BENAZEPRIL HCL 5 MG PO TABS
10.0000 mg | ORAL_TABLET | Freq: Every day | ORAL | Status: DC
  Administered 2020-02-21 – 2020-02-24 (×4): 10 mg via ORAL
  Filled 2020-02-20 (×5): qty 2

## 2020-02-20 MED ORDER — SOTALOL HCL 80 MG PO TABS
40.0000 mg | ORAL_TABLET | Freq: Two times a day (BID) | ORAL | Status: DC
  Administered 2020-02-20 – 2020-02-24 (×9): 40 mg via ORAL
  Filled 2020-02-20 (×11): qty 0.5

## 2020-02-20 MED ORDER — AMLODIPINE BESYLATE 10 MG PO TABS
10.0000 mg | ORAL_TABLET | Freq: Every day | ORAL | Status: DC
  Administered 2020-02-21 – 2020-02-24 (×4): 10 mg via ORAL
  Filled 2020-02-20 (×4): qty 1

## 2020-02-20 MED ORDER — VITAMIN D 25 MCG (1000 UNIT) PO TABS
1000.0000 [IU] | ORAL_TABLET | Freq: Two times a day (BID) | ORAL | Status: DC
  Administered 2020-02-20 – 2020-02-24 (×8): 1000 [IU] via ORAL
  Filled 2020-02-20 (×9): qty 1

## 2020-02-20 MED ORDER — CHLORHEXIDINE GLUCONATE 0.12 % MT SOLN
15.0000 mL | Freq: Two times a day (BID) | OROMUCOSAL | Status: DC
  Administered 2020-02-20: 15 mL via OROMUCOSAL

## 2020-02-20 MED ORDER — ATORVASTATIN CALCIUM 80 MG PO TABS
80.0000 mg | ORAL_TABLET | Freq: Every day | ORAL | Status: DC
  Administered 2020-02-20 – 2020-02-23 (×4): 80 mg via ORAL
  Filled 2020-02-20: qty 2
  Filled 2020-02-20 (×3): qty 1

## 2020-02-20 MED ORDER — AZITHROMYCIN 250 MG PO TABS
500.0000 mg | ORAL_TABLET | Freq: Every day | ORAL | Status: AC
  Administered 2020-02-20 – 2020-02-21 (×2): 500 mg via ORAL
  Filled 2020-02-20: qty 1
  Filled 2020-02-20: qty 2

## 2020-02-20 MED ORDER — DEXTROSE 50 % IV SOLN
25.0000 mL | Freq: Once | INTRAVENOUS | Status: AC
  Administered 2020-02-20: 25 mL via INTRAVENOUS

## 2020-02-20 MED ORDER — DOCUSATE SODIUM 100 MG PO CAPS
100.0000 mg | ORAL_CAPSULE | Freq: Two times a day (BID) | ORAL | Status: DC
  Administered 2020-02-20 – 2020-02-24 (×9): 100 mg via ORAL
  Filled 2020-02-20 (×9): qty 1

## 2020-02-20 MED ORDER — POLYETHYLENE GLYCOL 3350 17 G PO PACK
17.0000 g | PACK | Freq: Every day | ORAL | Status: DC
  Administered 2020-02-21: 17 g via ORAL
  Filled 2020-02-20 (×2): qty 1

## 2020-02-20 MED ORDER — DEXTROSE 50 % IV SOLN
INTRAVENOUS | Status: AC
  Filled 2020-02-20: qty 50

## 2020-02-20 MED ORDER — ASPIRIN 81 MG PO CHEW
81.0000 mg | CHEWABLE_TABLET | Freq: Every day | ORAL | Status: DC
  Administered 2020-02-20 – 2020-02-24 (×5): 81 mg via ORAL
  Filled 2020-02-20 (×5): qty 1

## 2020-02-20 NOTE — Progress Notes (Signed)
Bedside swallow eval performed: pt passed with ice chip and spoon of water, appeared to choke, cough after swallow through straw and sats waned. Consulting speech eval.

## 2020-02-20 NOTE — Progress Notes (Signed)
Pt refusing cpap for the night, pt stated she would rather wear her [place]. RT will continue to monitor as needed.

## 2020-02-20 NOTE — Progress Notes (Signed)
F/U bs swallow eval results that pt able to reliably swallow, no signs of aspiration w/ thin liquids. Will continue to monitor and progress diet as tolerated.

## 2020-02-20 NOTE — Evaluation (Signed)
Physical Therapy Evaluation Patient Details Name: [name] MRN: [id number] DOB: 04-Jan-1935 Today's Date: 02/20/2020   History of Present Illness  Pt is an 85 yo female s/p SOB, hypoxia, multifocal PNA and heart failure. Pmhx: brast cancer chemo (current), DMT2, CAD, CKD stage II, Afib.  Clinical Impression  Pt admitted with/for PNA and HF.  Pt needing min to mod assist presently for basic mobility.  Pt currently limited functionally due to the problems listed. ( See problems list.)   Pt will benefit from PT to maximize function and safety in order to get ready for next venue listed below.     Follow Up Recommendations SNF;Supervision/Assistance - 24 hour(Working toward [facility] with 24 hour supervision)    Equipment Recommendations  Other (comment)(TBA)    Recommendations for Other Services       Precautions / Restrictions Precautions Precautions: Fall;Other (comment) Precaution Comments: watch O2 and HR Restrictions Weight Bearing Restrictions: No      Mobility  Bed Mobility Overal bed mobility: Needs Assistance Bed Mobility: Supine to Sit     Supine to sit: Mod assist     General bed mobility comments: guiding trunk and BLEs to EOB  Transfers Overall transfer level: Needs assistance Equipment used: 2 person hand held assist Transfers: Sit to/from [employer] Transfers Sit to Stand: Min assist;+2 physical assistance;+2 safety/equipment Stand pivot transfers: Min assist;+2 physical assistance;+2 safety/equipment       General transfer comment: MinA +2 for hand heldA and taking a few steps from bed to recliner  Ambulation/Gait             General Gait Details: pivot transfers only  Stairs            Wheelchair Mobility    Modified Rankin (Stroke Patients Only)       Balance Overall balance assessment: Needs assistance Sitting-balance support: Bilateral upper extremity supported;Feet unsupported Sitting balance-Leahy Scale: Fair Sitting  balance - Comments: fatigues easily; feeling nauseous at EOB requiring blue containers to spit up in   Standing balance support: Single extremity supported;Bilateral upper extremity supported Standing balance-Leahy Scale: Poor Standing balance comment: reliant on external support                             Pertinent Vitals/Pain Pain Assessment: No/denies pain    Home Living Family/patient expects to be discharged to:: Private residence Living Arrangements: Alone Available Help at Discharge: Family;Available PRN/intermittently Type of Home: House Home Access: Stairs to enter Entrance Stairs-Rails: Psychiatric nurse of Steps: 5 Home Layout: One level Home Equipment: Toilet riser;Hand held shower head Additional Comments: children live 20-35 mins away    Prior Function Level of Independence: Independent with assistive device(s)         Comments: x10 days of weakness and not feeling well; 3 weeks ago, pt was independent prior     Hand Dominance   Dominant Hand: Right    Extremity/Trunk Assessment   Upper Extremity Assessment Upper Extremity Assessment: Generalized weakness    Lower Extremity Assessment Lower Extremity Assessment: Generalized weakness    Cervical / Trunk Assessment Cervical / Trunk Assessment: Kyphotic  Communication   Communication: No difficulties  Cognition Arousal/Alertness: Awake/alert Behavior During Therapy: WFL for tasks assessed/performed Overall Cognitive Status: Within Functional Limits for tasks assessed  General Comments General comments (skin integrity, edema, etc.): 6L O2, 118/86 (96), 103 BPM after exertion, sitting up in recliner. 2 family members present, but stepped out of room while therapy present.    Exercises     Assessment/Plan    PT Assessment Patient needs continued PT services  PT Problem List Decreased strength;Decreased activity  tolerance;Decreased balance;Decreased mobility;Decreased knowledge of use of DME;Cardiopulmonary status limiting activity       PT Treatment Interventions Gait training;Functional mobility training;Therapeutic activities;Balance training;Patient/family education    PT Goals (Current goals can be found in the Care Plan section)  Acute Rehab PT Goals Patient Stated Goal: to go home PT Goal Formulation: With patient Time For Goal Achievement: Mar 20, 2020 Potential to Achieve Goals: Good    Frequency Min 2X/week   Barriers to discharge Decreased caregiver support      Co-evaluation PT/OT/SLP Co-Evaluation/Treatment: Yes Reason for Co-Treatment: Complexity of the patient's impairments (multi-system involvement) PT goals addressed during session: Mobility/safety with mobility OT goals addressed during session: ADL's and self-care       AM-PAC PT "6 Clicks" Mobility  Outcome Measure Help needed turning from your back to your side while in a flat bed without using bedrails?: A Lot Help needed moving from lying on your back to sitting on the side of a flat bed without using bedrails?: A Lot Help needed moving to and from a bed to a chair (including a wheelchair)?: A Lot Help needed standing up from a chair using your arms (e.g., wheelchair or bedside chair)?: A Little Help needed to walk in hospital room?: A Lot Help needed climbing 3-5 steps with a railing? : A Lot 6 Click Score: 13    End of Session   Activity Tolerance: Patient tolerated treatment well;Patient limited by fatigue Patient left: in chair;with call bell/phone within reach;with chair alarm set Nurse Communication: Mobility status PT Visit Diagnosis: Other abnormalities of gait and mobility (R26.89);Muscle weakness (generalized) (M62.81);Difficulty in walking, not elsewhere classified (R26.2)    Time: [id number] PT Time Calculation (min) (ACUTE ONLY): 34 min   Charges:   PT Evaluation $PT Eval Moderate Complexity: 1  Mod          02/20/2020  Ginger Carne., PT Acute Rehabilitation Services [phone]  (pager) [phone]  (office)  Tessie Fass Tanvir Hipple 02/20/2020, 5:23 PM

## 2020-02-20 NOTE — Progress Notes (Signed)
Pt self-extubated while mittens in place. Pt placed on 6L oxygen with oxygen saturation 93-100s. Pt alert and able to state name and year. Elink MD and RT notified. No further interventions at this time

## 2020-02-20 NOTE — Progress Notes (Signed)
eLink Physician-Brief Progress Note Patient Name: [name] DOB: Mar 04, 1935 MRN: [id number]   Date of Service  02/20/2020  HPI/Events of Note  Pt self-extubated but appears comfortable on 6 liters O2 via n/c at the moment.  eICU Interventions  Will monitor patient closely for recurrence of acute respiratory failure requiring re-intubation with mechanical ventilation.        Kerry Kass Jestine Bicknell 02/20/2020, 5:18 AM

## 2020-02-20 NOTE — Progress Notes (Addendum)
Inpatient Diabetes Program Recommendations  AACE/ADA: New Consensus Statement on Inpatient Glycemic Control (2015)  Target Ranges:  Prepandial:   less than 140 mg/dL      Peak postprandial:   less than 180 mg/dL (1-2 hours)      Critically ill patients:  140 - 180 mg/dL   Lab Results  Component Value Date   GLUCAP 116 (H) 02/20/2020   HGBA1C 7.3 (A) 01/11/2020    Review of Glycemic Control Results for [name], [name] (MRN [id number]) as of 02/20/2020 10:47  Ref. Range 02/19/2020 07:24 02/19/2020 11:22 02/19/2020 15:56 02/19/2020 17:13 02/19/2020 19:58 02/19/2020 22:08 02/20/2020 03:53 02/20/2020 03:54 02/20/2020 04:19 02/20/2020 07:45  Glucose-Capillary Latest Ref Range: 70 - 99 mg/dL 195 (H) Novolog 2 units 105 (H) 33 (LL) D50 131 (H) 92 82 54 (L) 57 (L) D50 131 (H) 116 (H)   Diabetes history: DM2 Outpatient Diabetes medications: Novolog 4-7 units with meals (per correction scale) Current orders for Inpatient glycemic control: Novolog 0-9 units TID with meals, Novolog 0-5 units QHS  Inpatient Diabetes Program Recommendations:  Secure chatted with RN and patient self extubated and being evaluated regarding eating. Patient had hypoglycemia post Novolog 2 units given yesterday. -Consider Decrease Novolog correction to -Custom Novolog correction scale 0-5      151-200  1 unit      201-250  2 units      251-300  3 units      301-350  4 units      351-400  5 units  Thank you, Bethena Roys E. Kajah Santizo, RN, MSN, CDE  Diabetes Coordinator Inpatient Glycemic Control Team Team Pager [phone] (8am-5pm) 02/20/2020 10:52 AM

## 2020-02-20 NOTE — Procedures (Signed)
Extubation Procedure Note  Patient Details:   Name: [name] DOB: 08-19-1935 MRN: [id number]   Airway Documentation:    Vent end date: 02/20/20 Vent end time: 0510   Evaluation  Pt self extubated. Pt now on 6L [place], vital signs stable at this time. RT will continue to monitor.   Graciella Freer 02/20/2020, 5:28 AM

## 2020-02-20 NOTE — Progress Notes (Signed)
Wasted 125cc of Fentanyl in the sink. Witnessed by Forestine Na, RN.

## 2020-02-20 NOTE — Progress Notes (Signed)
Occupational Therapy Treatment Patient Details Name: [name] MRN: [id number] DOB: 1934/10/13 Today's Date: 02/20/2020    History of present illness Pt is an 85 yo female s/p SOB, hypoxia, multifocal PNA and heart failure. Pmhx: brast cancer chemo (current), DMT2, CAD, CKD stage II, Afib.   OT comments  Pt PTA:Pt living alone with supportive family nearby. Pt reports independence with ADL, mobility. Pt currently limited by decreased activity tolerance, decreased ability to care for self and decreased strength. Pt appears to have supportive family, but is not able to properly care for self to go home without 24/7 assist at this time.  Pt maxA overall for ADL and minA +2 for mobility. Pt feeling nauseous at EOB and fatigues easily. 6L O2, 118/86 (96), 103 BPM after exertion, sitting up in recliner. Pt would benefit from continued OT skilled services for ADL, mobility and energy conservation. OT following acutely.   Follow Up Recommendations  SNF;Supervision/Assistance - 24 hour(Initially pt requires 24/7 hope to progress to [facility].)    Equipment Recommendations  None recommended by OT    Recommendations for Other Services      Precautions / Restrictions Precautions Precautions: Fall;Other (comment) Precaution Comments: watch O2 and HR Restrictions Weight Bearing Restrictions: No       Mobility Bed Mobility Overal bed mobility: Needs Assistance Bed Mobility: Supine to Sit     Supine to sit: Mod assist     General bed mobility comments: guiding trunk and BLEs to EOB  Transfers Overall transfer level: Needs assistance Equipment used: 2 person hand held assist Transfers: Sit to/from [employer] Transfers Sit to Stand: Min assist;+2 physical assistance;+2 safety/equipment Stand pivot transfers: Min assist;+2 physical assistance;+2 safety/equipment       General transfer comment: MinA +2 for hand heldA and taking a few steps from bed to recliner    Balance Overall  balance assessment: Needs assistance Sitting-balance support: Bilateral upper extremity supported;Feet unsupported Sitting balance-Leahy Scale: Fair Sitting balance - Comments: fatigues easily; feeling nauseous at EOB requiring blue containers to spit up in                                   ADL either performed or assessed with clinical judgement   ADL Overall ADL's : Needs assistance/impaired Eating/Feeding: Set up;Sitting   Grooming: Minimal assistance;Sitting Grooming Details (indicate cue type and reason): fatigues easily Upper Body Bathing: Moderate assistance;Sitting   Lower Body Bathing: Maximal assistance;Sitting/lateral leans;Sit to/from stand;Cueing for safety   Upper Body Dressing : Moderate assistance;Sitting   Lower Body Dressing: Maximal assistance;Cueing for safety;Sitting/lateral leans;Sit to/from stand   Toilet Transfer: Minimal assistance;BSC;Ambulation Armed forces technical officer Details (indicate cue type and reason): short steps from bed to recliner (simulation) Toileting- Clothing Manipulation and Hygiene: Maximal assistance;Sitting/lateral lean;Sit to/from stand;Cueing for safety       Functional mobility during ADLs: Minimal assistance;+2 for physical assistance;Cueing for safety General ADL Comments: Pt limited by decreased activity tolerance, decreased ability to care for self and decreased strength. Pt appears to have supportive family, but is not able to properly care for self to go home without 24/7 assist at this time.     Vision Baseline Vision/History: Wears glasses Wears Glasses: Reading only Patient Visual Report: No change from baseline Vision Assessment?: No apparent visual deficits   Perception     Praxis      Cognition Arousal/Alertness: Awake/alert Behavior During Therapy: WFL for tasks assessed/performed Overall Cognitive Status:  Within Functional Limits for tasks assessed                                           Exercises     Shoulder Instructions       General Comments 6L O2, 118/86 (96), 103 BPM after exertion, sitting up in recliner. 2 family members present, but stepped out of room while therapy present.    Pertinent Vitals/ Pain       Pain Assessment: No/denies pain  Home Living Family/patient expects to be discharged to:: Private residence Living Arrangements: Alone Available Help at Discharge: Family;Available PRN/intermittently Type of Home: House Home Access: Stairs to enter [employer] of Steps: 5 Entrance Stairs-Rails: Right;Left Home Layout: One level     Bathroom Shower/Tub: Occupational psychologist: Standard     Home Equipment: Toilet riser;Hand held shower head   Additional Comments: children live 20-35 mins away      Prior Functioning/Environment Level of Independence: Independent with assistive device(s)        Comments: x10 days of weakness and not feeling well; 3 weeks ago, pt was independent prior   Frequency  Min 2X/week        Progress Toward Goals  OT Goals(current goals can now be found in the care plan section)     Acute Rehab OT Goals Patient Stated Goal: to go home OT Goal Formulation: With patient Time For Goal Achievement: 03/06/20 Potential to Achieve Goals: Good ADL Goals Pt Will Perform Grooming: with supervision;standing Pt Will Perform Lower Body Dressing: with min guard assist;sitting/lateral leans;sit to/from stand Pt Will Transfer to Toilet: with supervision;ambulating;bedside commode Pt Will Perform Toileting - Clothing Manipulation and hygiene: with min guard assist;sitting/lateral leans;sit to/from stand Pt/caregiver will Perform Home Exercise Program: Increased strength;Both right and left upper extremity;With Supervision;With written HEP provided Additional ADL Goal #1: Pt will tolerate x6 mins of ADL task with minA overall and with O2 sats >90% with exertion.  Plan      Co-evaluation     PT/OT/SLP Co-Evaluation/Treatment: Yes Reason for Co-Treatment: Complexity of the patient's impairments (multi-system involvement);For patient/therapist safety   OT goals addressed during session: ADL's and self-care      AM-PAC OT "6 Clicks" Daily Activity     Outcome Measure   Help from another person eating meals?: A Little Help from another person taking care of personal grooming?: A Little Help from another person toileting, which includes using toliet, bedpan, or urinal?: A Lot Help from another person bathing (including washing, rinsing, drying)?: A Lot Help from another person to put on and taking off regular upper body clothing?: A Lot Help from another person to put on and taking off regular lower body clothing?: A Lot 6 Click Score: 14    End of Session Equipment Utilized During Treatment: Gait belt  OT Visit Diagnosis: Unsteadiness on feet (R26.81);Muscle weakness (generalized) (M62.81)   Activity Tolerance Patient limited by fatigue   Patient Left in chair;with call bell/phone within reach;with chair alarm set   Nurse Communication Mobility status        Time: 1250-1315 OT Time Calculation (min): 25 min  Charges: OT General Charges $OT Visit: 1 Visit OT Evaluation $OT Eval Moderate Complexity: 1 Mod  Jefferey Pica, OTR/L Acute Rehabilitation Services Pager: [phone] Office: [phone]    Dmarco Baldus C 02/20/2020, 4:44 PM

## 2020-02-20 NOTE — Progress Notes (Deleted)
OT Cancellation Note  Patient Details Name: PARISSA ALONSO MRN: [id number] DOB: 25-Mar-1935   Cancelled Treatment:    Reason Eval/Treat Not Completed: Other (comment)(Pt now full comfort care at this time as of 12:40pm.)   OT to cancel orders.  Jefferey Pica, OTR/L Acute Rehabilitation Services Pager: [phone] Office: [phone]   Jefferey Pica 02/20/2020, 12:43 PM

## 2020-02-20 NOTE — Progress Notes (Addendum)
NAME:  [name], MRN:  [id number], DOB:  [birth year], LOS: 3 ADMISSION DATE:  02/16/2020, CONSULTATION DATE:  02/18/2020 REFERRING MD:  Dr. Bonner Puna, [facility], CHIEF COMPLAINT:  Short of breath   Brief History   85 yo female with hx of chronic hypoxic/hypercapnic respiratory failure from sleep disordered breathing, restrictive lung disease from kyphoscoliosis, and asthma presented to ER with worsening shortness of breath and hypoxia from heart failure and possible pneumonia.  Overnight on 5/17, pt apparently became somnolent, bradycardic and apneic, intubated on the floor and moved to intensive care.    Past Medical History  A fib, diastolic CHF, Osteoporosis, PAD, HTN, HLD, Breast cancer, Diverticulitis, DM type 2, CAD, CKD 2, Iron deficiency anemia  Significant Hospital Events   5/14 admit 5/17 Intubated and transferred to ICU  Consults:  PCCM  Procedures:  5/17 ETT 5/18  Significant Diagnostic Tests:   PFT 02/04/17 >> FEV1 0.73 (45%), FEV1% 77, TLC 2.84 (63%), DLCO 45%, + BD  CT chest 02/05/17 >> atherosclerosis, s/p CABG, mild pleural thickening b/l, 4 mm RLL nodule, healed rib fx's, kyphosis, 2.6 cm low density lesion in pancreatic tail  PSG 04/13/17 >>RDI 33.9. Test done with 2 liters oxygen.  Echo 02/17/20 >> EF 60 to 65%, mod LVH, grade 2 DD, RVSP 60.4 mmHg, mod LA dilation, mild MR, mild/mod TR  Head CT 5/17 > Mild chronic ischemic white matter disease. No acute intracranial abnormality seen.  Micro Data:  SARS CoV2 PCR 5/14 >> negative  Antimicrobials:  Rocephin 5/14 >> 5/18 Zithromax 5/14 >>  5/18  Interim history/subjective:  RN reports patient self extubated this AM. Appears comfortable on 4L [place]. No other acute events overnight   Objective   Blood pressure 131/61, pulse (!) 107, temperature 98.7 F (37.1 C), temperature source Oral, resp. rate (!) 23, height 5\' 2"  (1.575 m), weight 51.4 kg, SpO2 99 %.    Vent Mode: PRVC FiO2 (%):  [40 %] 40 % Set Rate:  [20  bmp] 20 bmp Vt Set:  [400 mL] 400 mL PEEP:  [5 cmH20] 5 cmH20 Pressure Support:  [15 cmH20] 15 cmH20 Plateau Pressure:  [19 cmH20-24 cmH20] 19 cmH20   Intake/Output Summary (Last 24 hours) at 02/20/2020 1004 Last data filed at 02/20/2020 0800 Gross per 24 hour  Intake 995.22 ml  Output 1170 ml  Net -174.78 ml   Filed Weights   02/19/20 0047 02/19/20 0443 02/20/20 0500  Weight: 52.6 kg 52.6 kg 51.4 kg   Physical exam General: Chronically ill appearing elderly deconditioned female lying in bed, in NAD HEENT: [place]/AT, MM pink/moist, PERRL, sclera non-icteric Neuro: Alert and oriented x3, able to follow all commands, non-focal  CV: s1s2 regular rate and rhythm, no murmur, rubs, or gallops,  PULM:  Clear to ascultation bilaterally, no increased work of breathing, oxygen saturations 88-99 on 4L [place]  GI: soft, bowel sounds active in all 4 quadrants, non-tender, non-distended Extremities: warm/dry, no edema  Skin: no rashes or lesions  Resolved Hospital Problem list   Bradycardia  Assessment & Plan:   Acute encephalopathy -likely in setting of hypercarbia and hypoxia. Mentation slowly improving with improvement in gas exchange -Head CT negative  P: Continue treatment for hypercapnic respiratory failure as below  Minimize sedation as able  Mobilize as able   Acute on chronic hypoxic and hypercarbic respiratory failure  -in setting of acute HFpEF exacerbation, CAP, underlying restrictive lung disease, sleep disordered breathing HX kyphoscoliosis may compound restrictive dz -Bradycardic and apneic episode  of unclear etiology, ABG 7.2/84.6 improving after intubation.  Patient had been on CPAP until right before this event, consider hypercarbic respiratory failure versus seizure versus central neurologic issue.   P: Self extubated AM of 5/18 Continue supplemental oxygen  Continue nocturnal CPAP  Continue BD Will stop empiric antibiotics today Follow CBC and fever curve off  antibiotics   Leukocytosis, mild -suspect reactive P: Stop empiric antibiotics  Trend Fever curve and WBC   Acute on chronic diastolic CHF -Echo [id number] with EF 60 to 65% and grade 2 diastolic dysfunction, severely elevated pulmonary artery systolic pressure -BNP [id number] Hx CAD Hx HTN P: Continue to diurese Monitor volume status  Continue home norvasc, lotensin, renexa, sotalol, and Asprin   AKI, improving  -Creatinine  up trending from 1.1 on admission to 1.48 P: Creatine down to 1.35 this AM Follow renal function / urine output Trend Bmet Avoid nephrotoxins, ensure adequate renal perfusion  IV hydration  Hypomagnesemia Hyperkalemia, resolved  P: Supplement as needed  Trend Bmet   DM2  P: Continue SSI and meal coverage   Best practice:  Diet: N.p.o. DVT prophylaxis: lovenox GI prophylaxis: not indicated Mobility: BR to Bed in chair position Code Status: full code Family Communication: pending 5/17 Disposition: ICU  Labs   CBC: Recent Labs  Lab 02/16/20 1814 02/17/20 0450 02/18/20 0231 02/19/20 0142 02/19/20 0202  WBC 10.9* 13.4* 13.5* 11.8*  --   HGB 10.2* 10.1* 11.5* 9.1* 10.5*  HCT 34.1* 34.1* 39.1 30.4* 31.0*  MCV 96.9 98.0 98.2 97.1  --   PLT 249 251 253 191  --     Basic Metabolic Panel: Recent Labs  Lab 02/16/20 1814 02/16/20 1814 02/17/20 0450 02/18/20 0231 02/19/20 0030 02/19/20 0142 02/19/20 0202  NA 139   < > 139 141 139 137 137  K 4.9   < > 4.4 4.6 5.7* 5.4* 4.4  CL 96*  --  99 97* 92* 91*  --   CO2 34*  --  29 34* 32 31  --   GLUCOSE 187*  --  135* 83 175* 390*  --   BUN 27*  --  21 22 33* 34*  --   CREATININE 1.10*  --  0.99 1.11* 1.47* 1.48*  --   CALCIUM 9.5  --  9.0 9.5 9.2 8.7*  --   MG  --   --   --   --   --  1.5*  --   PHOS  --   --   --   --   --  5.0*  --    < > = values in this interval not displayed.   GFR: Estimated Creatinine Clearance: 23.2 mL/min (A) (by C-G formula based on SCr of 1.48 mg/dL (H)). Recent Labs   Lab 02/16/20 1814 02/17/20 0102 02/17/20 0450 02/18/20 0231 02/19/20 0142 02/19/20 0419  PROCALCITON  --  <0.10  --   --   --   --   WBC 10.9*  --  13.4* 13.5* 11.8*  --   LATICACIDVEN  --   --   --   --  1.8 2.3*    Liver Function Tests: Recent Labs  Lab 02/19/20 0142  AST 17  ALT 11  ALKPHOS 54  BILITOT 0.9  PROT 5.5*  ALBUMIN 2.8*   No results for input(s): LIPASE, AMYLASE in the last 168 hours. No results for input(s): AMMONIA in the last 168 hours.  ABG    Component Value Date/Time   PHART  7.442 02/19/2020 0202   PCO2ART 48.4 (H) 02/19/2020 0202   PO2ART 92 02/19/2020 0202   HCO3 33.0 (H) 02/19/2020 0202   TCO2 34 (H) 02/19/2020 0202   ACIDBASEDEF 2.0 12/05/2010 0945   O2SAT 97.0 02/19/2020 0202     Coagulation Profile: No results for input(s): INR, PROTIME in the last 168 hours.  Cardiac Enzymes: No results for input(s): CKTOTAL, CKMB, CKMBINDEX, TROPONINI in the last 168 hours.  HbA1C: Hemoglobin A1C  Date/Time Value Ref Range Status  01/11/2020 11:41 AM 7.3 (A) 4.0 - 5.6 % Final   Hgb A1c MFr Bld  Date/Time Value Ref Range Status  09/12/2019 12:35 PM 8.3 (H) 4.6 - 6.5 % Final    Comment:    Glycemic Control Guidelines for People with Diabetes:Non Diabetic:  <6%Goal of Therapy: <7%Additional Action Suggested:  >8%   04/24/2019 12:50 PM 8.0 (H) 4.6 - 6.5 % Final    Comment:    Glycemic Control Guidelines for People with Diabetes:Non Diabetic:  <6%Goal of Therapy: <7%Additional Action Suggested:  >8%     CBG: Recent Labs  Lab 02/19/20 2208 02/20/20 0353 02/20/20 0354 02/20/20 0419 02/20/20 0745  GLUCAP 82 54* 57* 131* 116*     CRITICAL CARE Performed by: Johnsie Cancel   Total critical care time: 38 minutes  Critical care time was exclusive of separately billable procedures and treating other patients.  Critical care was necessary to treat or prevent imminent or life-threatening deterioration.  Critical care was time spent  personally by me on the following activities: development of treatment plan with patient and/or surrogate as well as nursing, discussions with consultants, evaluation of patient's response to treatment, examination of patient, obtaining history from patient or surrogate, ordering and performing treatments and interventions, ordering and review of laboratory studies, ordering and review of radiographic studies, pulse oximetry and re-evaluation of patient's condition.  Johnsie Cancel, NP-C [facility] Contact / Pager information can be found on Amion  02/20/2020, 10:36 AM

## 2020-02-21 DIAGNOSIS — B370 Candidal stomatitis: Secondary | ICD-10-CM

## 2020-02-21 DIAGNOSIS — I5031 Acute diastolic (congestive) heart failure: Secondary | ICD-10-CM

## 2020-02-21 LAB — CBC
HCT: 33.5 % — ABNORMAL LOW (ref 36.0–46.0)
Hemoglobin: 10.4 g/dL — ABNORMAL LOW (ref 12.0–15.0)
MCH: 29.4 pg (ref 26.0–34.0)
MCHC: 31 g/dL (ref 30.0–36.0)
MCV: 94.6 fL (ref 80.0–100.0)
Platelets: 206 10*3/uL (ref 150–400)
RBC: 3.54 MIL/uL — ABNORMAL LOW (ref 3.87–5.11)
RDW: 15.3 % (ref 11.5–15.5)
WBC: 13.9 10*3/uL — ABNORMAL HIGH (ref 4.0–10.5)
nRBC: 0 % (ref 0.0–0.2)

## 2020-02-21 LAB — BASIC METABOLIC PANEL
Anion gap: 13 (ref 5–15)
BUN: 31 mg/dL — ABNORMAL HIGH (ref 8–23)
CO2: 33 mmol/L — ABNORMAL HIGH (ref 22–32)
Calcium: 9 mg/dL (ref 8.9–10.3)
Chloride: 94 mmol/L — ABNORMAL LOW (ref 98–111)
Creatinine, Ser: 1.23 mg/dL — ABNORMAL HIGH (ref 0.44–1.00)
GFR calc Af Amer: 47 mL/min — ABNORMAL LOW (ref >60)
GFR calc non Af Amer: 41 mL/min — ABNORMAL LOW (ref >60)
Glucose, Bld: 22 mg/dL — CL (ref 70–99)
Potassium: 4.5 mmol/L (ref 3.5–5.1)
Sodium: 140 mmol/L (ref 135–145)

## 2020-02-21 LAB — GLUCOSE, CAPILLARY
Glucose-Capillary: 107 mg/dL — ABNORMAL HIGH (ref 70–99)
Glucose-Capillary: 22 mg/dL — CL (ref 70–99)
Glucose-Capillary: 145 mg/dL — ABNORMAL HIGH (ref 70–99)
Glucose-Capillary: 90 mg/dL (ref 70–99)
Glucose-Capillary: 111 mg/dL — ABNORMAL HIGH (ref 70–99)

## 2020-02-21 MED ORDER — FLUCONAZOLE IN SODIUM CHLORIDE 200-0.9 MG/100ML-% IV SOLN
200.0000 mg | Freq: Once | INTRAVENOUS | Status: AC
  Administered 2020-02-21: 200 mg via INTRAVENOUS
  Filled 2020-02-21: qty 100

## 2020-02-21 MED ORDER — PREDNISONE 20 MG PO TABS: 50.0000 mg | ORAL_TABLET | Freq: Every day | ORAL | Status: DC

## 2020-02-21 MED ORDER — SODIUM CHLORIDE 0.9 % IV SOLN
100.0000 mg | Freq: Two times a day (BID) | INTRAVENOUS | Status: DC
  Filled 2020-02-21 (×2): qty 100

## 2020-02-21 MED ORDER — DEXTROSE 50 % IV SOLN
INTRAVENOUS | Status: AC
  Filled 2020-02-21: qty 50

## 2020-02-21 MED ORDER — METHYLPREDNISOLONE SODIUM SUCC 40 MG IJ SOLR: 40.0000 mg | Freq: Two times a day (BID) | INTRAMUSCULAR | Status: DC

## 2020-02-21 MED ORDER — FLUCONAZOLE 100MG IVPB
100.0000 mg | INTRAVENOUS | Status: DC
  Administered 2020-02-21 – 2020-02-23 (×3): 100 mg via INTRAVENOUS
  Filled 2020-02-21 (×4): qty 50

## 2020-02-21 MED ORDER — DEXTROSE 50 % IV SOLN
25.0000 mL | Freq: Once | INTRAVENOUS | Status: AC
  Administered 2020-02-21: 25 mL via INTRAVENOUS

## 2020-02-21 NOTE — Progress Notes (Addendum)
[facility] PROGRESS NOTE    Progress Note  [name]  [id number] DOB: December 19, 1934 DOA: 02/16/2020 PCP: Tonia Ghent, MD     Brief Narrative:   [name] is an 85 y.o. female past medical history of chronic atrial fibrillation, not on anticoagulation due to spontaneous retroperitoneal bleed, chronic diastolic heart failure with the most recent echo in 2017, chronic respiratory failure on 2 L of oxygen, restrictive lung disease from kyphosis, history of breast cancer diabetes mellitus type 2 and chronic kidney disease stage II presents to the ED with shortness of breath that started 10 days prior to admission progressively getting worst, saw her cardiologist on 02/06/2020 they felt her bilateral lower extremity and was minimal.  Was started empirically and treated for possible multifocal pneumonia on 01/17/2020 intubated on 02/19/2020 and extubated on 02/20/2020  Assessment/Plan:   Acute on chronic respiratory failure with hypercarbia secondarily to acute decompensated diastolic heart failure: In the setting of acute heart failure exacerbation, underlying restrictive lung disease with sleep disorder. She self extubated herself on 02/20/2020.  Empiric antibiotics were discontinued on 02/20/2020. Started on diuresis on admission has been weaned to 3 L of oxygen satting greater than 88%. She usually weighs around 54 kg, here in the hospital this morning she is 52.   She still has positive hepatojugular reflux on physical exam continue IV Lasix. She has no wheezing on physical exam she does have crackles mainly at the right lung base.  Acute encephalopathy: Likely in the setting of hypercarbia and hypoxia mentation has been improved, appears to be at baseline. CT scan of the head is negative. Continue to avoid sedation.  Mild leukocytosis: Likely reactive has remained afebrile discontinue antibiotics.  Acute on chronic diastolic heart failure: With an EF of 60%, BNP of  437. Continue sotalol and diuresis.  Acute kidney injury on chronic kidney disease stage III: With a baseline creatinine of less than 1, peaked at 1.4, is clearly improving with IV diuresis.  Electrolyte imbalance, hypomagnesemia/hyperkalemia: Treated and resolved.  Diabetes mellitus type 2: Currently on minimal insulin had an episode of hypoglycemia last night, will discontinue short acting insulin.  She is on glyburide and short acting insulin at home.  Essential hypertension:  Oral thrush: Start diflucan   DVT prophylaxis: lovenox Family Communication:Son Status is: Inpatient  Remains inpatient appropriate because:IV treatments appropriate due to intensity of illness or inability to take PO   Dispo: The patient is from: Home              Anticipated d/c is to: SNF              Anticipated d/c date is: 3 days              Patient currently is not medically stable to d/c.  Code Status:     Code Status Orders  (From admission, onward)         Start     Ordered   02/19/20 1450  Limited resuscitation (code)  Continuous    Question Answer Comment  In the event of cardiac or respiratory ARREST: Initiate Code Blue, Call Rapid Response No   In the event of cardiac or respiratory ARREST: Perform CPR No   In the event of cardiac or respiratory ARREST: Perform Intubation/Mechanical Ventilation Yes   In the event of cardiac or respiratory ARREST: Use NIPPV/BiPAp only if indicated Yes   In the event of cardiac or respiratory ARREST: Administer ACLS medications if indicated No  In the event of cardiac or respiratory ARREST: Perform Defibrillation or Cardioversion if indicated No   Comments Presently intubated      02/19/20 1450        Code Status History    Date Active Date Inactive Code Status Order ID Comments User Context   02/17/2020 0051 02/19/2020 1450 Full Code [id number]  Etta Quill, DO ED   11/20/2016 1532 11/25/2016 1656 Full Code [id number]  Radene Gunning, NP  ED   10/09/2012 1834 10/12/2012 2057 Full Code [id number]  Vilinda Blanks, RN ED   Advance Care Planning Activity        IV Access:    Peripheral IV   Procedures and diagnostic studies:   CT HEAD WO CONTRAST  Result Date: 02/19/2020 CLINICAL DATA:  Altered mental status. EXAM: CT HEAD WITHOUT CONTRAST TECHNIQUE: Contiguous axial images were obtained from the base of the skull through the vertex without intravenous contrast. COMPARISON:  July 14, 2016. FINDINGS: Brain: Mild chronic ischemic white matter disease is noted. No mass effect or midline shift is noted. Ventricular size is within normal limits. There is no evidence of mass lesion, hemorrhage or acute infarction. Vascular: No hyperdense vessel or unexpected calcification. Skull: Normal. Negative for fracture or focal lesion. Sinuses/Orbits: No acute finding. Other: None. IMPRESSION: Mild chronic ischemic white matter disease. No acute intracranial abnormality seen. Electronically Signed   By: Marijo Conception M.D.   On: 02/19/2020 14:53     Medical Consultants:    None.  Anti-Infectives:   doxycycline  Subjective:    [name] she relates her breathing is better than when she came in.  Objective:    Vitals:   02/20/20 2034 02/21/20 0012 02/21/20 0354 02/21/20 0827  BP:  (!) 118/55 (!) 128/56 128/63  Pulse:  65 62   Resp:  19 (!) 21   Temp:  98.2 F (36.8 C) (!) 97.4 F (36.3 C) (!) 97.4 F (36.3 C)  TempSrc:  Oral Oral Oral  SpO2: 93% 95% 95%   Weight:   52.6 kg   Height:       SpO2: 95 % O2 Flow Rate (L/min): 3.5 L/min FiO2 (%): 40 %   Intake/Output Summary (Last 24 hours) at 02/21/2020 0930 Last data filed at 02/21/2020 0300 Gross per 24 hour  Intake 297.47 ml  Output 550 ml  Net -252.53 ml   Filed Weights   02/19/20 0443 02/20/20 0500 02/21/20 0354  Weight: 52.6 kg 51.4 kg 52.6 kg    Exam: General exam: In no acute distress. Respiratory system: Good air movement and crackles at  bases but more pronounced on the right. Cardiovascular system: S1 & S2 heard, RRR.  Positive JVD Gastrointestinal system: Abdomen is nondistended, soft and nontender.  Extremities: No pedal edema. Skin: No rashes, lesions or ulcers Psychiatry: Judgement and insight appear normal. Mood & affect appropriate.    Data Reviewed:    Labs: Basic Metabolic Panel: Recent Labs  Lab 02/18/20 0231 02/18/20 0231 02/19/20 0030 02/19/20 0030 02/19/20 0142 02/19/20 0142 02/19/20 0202 02/19/20 0202 02/20/20 0909 02/21/20 0332  NA 141   < > 139  --  137  --  137  --  139 140  K 4.6   < > 5.7*   < > 5.4*   < > 4.4   < > 3.7 4.5  CL 97*  --  92*  --  91*  --   --   --  93* 94*  CO2  34*  --  32  --  31  --   --   --  34* 33*  GLUCOSE 83  --  175*  --  390*  --   --   --  146* 22*  BUN 22  --  33*  --  34*  --   --   --  29* 31*  CREATININE 1.11*  --  1.47*  --  1.48*  --   --   --  1.35* 1.23*  CALCIUM 9.5  --  9.2  --  8.7*  --   --   --  9.1 9.0  MG  --   --   --   --  1.5*  --   --   --  1.8  --   PHOS  --   --   --   --  5.0*  --   --   --   --   --    < > = values in this interval not displayed.   GFR Estimated Creatinine Clearance: 27.9 mL/min (A) (by C-G formula based on SCr of 1.23 mg/dL (H)). Liver Function Tests: Recent Labs  Lab 02/19/20 0142  AST 17  ALT 11  ALKPHOS 54  BILITOT 0.9  PROT 5.5*  ALBUMIN 2.8*   No results for input(s): LIPASE, AMYLASE in the last 168 hours. No results for input(s): AMMONIA in the last 168 hours. Coagulation profile No results for input(s): INR, PROTIME in the last 168 hours. COVID-19 Labs  No results for input(s): DDIMER, FERRITIN, LDH, CRP in the last 72 hours.  Lab Results  Component Value Date   [place] NEGATIVE 02/16/2020    CBC: Recent Labs  Lab 02/17/20 0450 02/17/20 0450 02/18/20 0231 02/19/20 0142 02/19/20 0202 02/20/20 0909 02/21/20 0332  WBC 13.4*  --  13.5* 11.8*  --  22.6* 13.9*  HGB 10.1*   < > 11.5* 9.1*  10.5* 10.5* 10.4*  HCT 34.1*   < > 39.1 30.4* 31.0* 34.2* 33.5*  MCV 98.0  --  98.2 97.1  --  94.0 94.6  PLT 251  --  253 191  --  224 206   < > = values in this interval not displayed.   Cardiac Enzymes: No results for input(s): CKTOTAL, CKMB, CKMBINDEX, TROPONINI in the last 168 hours. BNP (last 3 results) Recent Labs    02/06/20 1449  PROBNP 985*   CBG: Recent Labs  Lab 02/20/20 1143 02/20/20 1531 02/20/20 2103 02/21/20 0650 02/21/20 0712  GLUCAP 141* 117* 111* 22* 107*   D-Dimer: No results for input(s): DDIMER in the last 72 hours. Hgb A1c: No results for input(s): HGBA1C in the last 72 hours. Lipid Profile: Recent Labs    02/19/20 0142  TRIG 116   Thyroid function studies: No results for input(s): TSH, T4TOTAL, T3FREE, THYROIDAB in the last 72 hours.  Invalid input(s): FREET3 Anemia work up: No results for input(s): VITAMINB12, FOLATE, FERRITIN, TIBC, IRON, RETICCTPCT in the last 72 hours. Sepsis Labs: Recent Labs  Lab 02/17/20 0102 02/17/20 0450 02/18/20 0231 02/19/20 0142 02/19/20 0419 02/20/20 0909 02/21/20 0332  PROCALCITON <0.10  --   --   --   --   --   --   WBC  --    < > 13.5* 11.8*  --  22.6* 13.9*  LATICACIDVEN  --   --   --  1.8 2.3*  --   --    < > = values in this  interval not displayed.   Microbiology Recent Results (from the past 240 hour(s))  SARS Coronavirus 2 by RT PCR (hospital order, performed in [facility] hospital lab) Nasopharyngeal Nasopharyngeal Swab     Status: None   Collection Time: 02/16/20 11:32 PM   Specimen: Nasopharyngeal Swab  Result Value Ref Range Status   SARS Coronavirus 2 NEGATIVE NEGATIVE Final    Comment: (NOTE) SARS-CoV-2 target nucleic acids are NOT DETECTED. The SARS-CoV-2 RNA is generally detectable in upper and lower respiratory specimens during the acute phase of infection. The lowest concentration of SARS-CoV-2 viral copies this assay can detect is 250 copies / mL. A negative result does not  preclude SARS-CoV-2 infection and should not be used as the sole basis for treatment or other patient management decisions.  A negative result may occur with improper specimen collection / handling, submission of specimen other than nasopharyngeal swab, presence of viral mutation(s) within the areas targeted by this assay, and inadequate number of viral copies (<250 copies / mL). A negative result must be combined with clinical observations, patient history, and epidemiological information. Fact Sheet for Patients:   StrictlyIdeas.no Fact Sheet for Healthcare Providers: BankingDealers.co.za This test is not yet approved or cleared  by the [place] FDA and has been authorized for detection and/or diagnosis of SARS-CoV-2 by FDA under an Emergency Use Authorization (EUA).  This EUA will remain in effect (meaning this test can be used) for the duration of the COVID-19 declaration under Section 564(b)(1) of the Act, 21 U.S.C. section 360bbb-3(b)(1), unless the authorization is terminated or revoked sooner. Performed at [facility], [place] [address], [place], [place] [address]   MRSA PCR Screening     Status: None   Collection Time: 02/19/20  1:13 AM   Specimen: Nasopharyngeal  Result Value Ref Range Status   MRSA by PCR NEGATIVE NEGATIVE Final    Comment:        The GeneXpert MRSA Assay (FDA approved for NASAL specimens only), is one component of a comprehensive MRSA colonization surveillance program. It is not intended to diagnose MRSA infection nor to guide or monitor treatment for MRSA infections. Performed at [facility], [place] [address] [place], [place] [address]      Medications:   . amLODipine  10 mg Oral Daily  . aspirin  81 mg Oral Daily  . atorvastatin  80 mg Oral q1800  . benazepril  10 mg Oral Daily  . budesonide (PULMICORT) nebulizer solution  0.25 mg Nebulization BID  . cholecalciferol  1,000  Units Oral BID  . dextrose      . docusate sodium  100 mg Oral BID  . enoxaparin (LOVENOX) injection  30 mg Subcutaneous Q24H  . furosemide  40 mg Intravenous Daily  . insulin aspart  0-5 Units Subcutaneous QHS  . insulin aspart  0-9 Units Subcutaneous TID WC  . mouth rinse  15 mL Mouth Rinse q12n4p  . polyethylene glycol  17 g Oral Daily  . ranolazine  500 mg Oral BID  . sodium chloride flush  3 mL Intravenous Once  . sotalol  40 mg Oral BID   Continuous Infusions: . famotidine (PEPCID) IV 20 mg (02/21/20 0818)      LOS: 4 days   Charlynne Cousins  [facility]  02/21/2020, 9:30 AM

## 2020-02-21 NOTE — TOC Initial Note (Signed)
Transition of Care [facility]) - Initial/Assessment Note    Patient Details  Name: [name] MRN: [id number] Date of Birth: 11-05-1934  Transition of Care [facility]) CM/SW Contact:    Jacquelynn Cree Phone Number: 02/21/2020, 12:24 PM  Clinical Narrative:                 CSW received consult for possible SNF placement at time of discharge. CSW met with patient bedside to discuss discharge options. Patient expressed she would like to be walking at discharge so she is able to discharge home. Patient expressed short-term stay at SNF can be a secondary option and provided verbal permission to be faxed out.  CSW inquired on supports, patient expressed she does not need her family contacted at this time. Patient stated she will be sure to have someone contacted prior to her discharge. TOC team will continue to follow.  Expected Discharge Plan: [place] Barriers to Discharge: Continued Medical Work up   Patient Goals and CMS Choice Patient states their goals for this hospitalization and ongoing recovery are:: to be walking at discharge and return home CMS Medicare.gov Compare Post Acute Care list provided to:: Patient Choice offered to / list presented to : Patient  Expected Discharge Plan and Services Expected Discharge Plan: [place]       Living arrangements for the past 2 months: Single Family Home                                      Prior Living Arrangements/Services Living arrangements for the past 2 months: Single Family Home Lives with:: Self Patient language and need for interpreter reviewed:: Yes Do you feel safe going back to the place where you live?: Yes      Need for Family Participation in Patient Care: No (Comment) Care giver support system in place?: Yes (comment)   Criminal Activity/Legal Involvement Pertinent to Current Situation/Hospitalization: No - Comment as needed  Activities of Daily Living Home Assistive  Devices/Equipment: Oxygen ADL Screening (condition at time of admission) Patient's cognitive ability adequate to safely complete daily activities?: No Is the patient deaf or have difficulty hearing?: No Does the patient have difficulty seeing, even when wearing glasses/contacts?: Yes Does the patient have difficulty concentrating, remembering, or making decisions?: Yes Patient able to express need for assistance with ADLs?: Yes Does the patient have difficulty dressing or bathing?: Yes Independently performs ADLs?: No Communication: Independent Dressing (OT): Dependent Grooming: Dependent Feeding: Dependent Bathing: Dependent Toileting: Dependent In/Out Bed: Dependent Walks in Home: Dependent Does the patient have difficulty walking or climbing stairs?: Yes Weakness of Legs: Both Weakness of Arms/Hands: Both  Permission Sought/Granted Permission sought to share information with : Chartered certified accountant granted to share information with : Yes, Verbal Permission Granted     Permission granted to share info w AGENCY: SNFs        Emotional Assessment Appearance:: Appears stated age Attitude/Demeanor/Rapport: Engaged, Ambitious Affect (typically observed): Pleasant Orientation: : Oriented to Self, Oriented to Place, Oriented to  Time, Oriented to Situation Alcohol / Substance Use: Not Applicable Psych Involvement: No (comment)  Admission diagnosis:  Multifocal pneumonia [J18.9] Acute on chronic respiratory failure with hypoxia ([place]) [J96.21] Patient Active Problem List   Diagnosis Date Noted  . Multifocal pneumonia 02/17/2020  . Acute on chronic respiratory failure with hypoxia ([place]) 02/17/2020  . Oral thrush  01/14/2020  . Cough 01/13/2018  . Medicare annual wellness visit, subsequent 09/09/2017  . Renal cyst 09/09/2017  . Sleep related hypoxia 06/02/2017  . Fall at home 05/27/2017  . Abnormal CBC 05/27/2017  . Sleep apnea 04/14/2017  . Restrictive  lung disease due to kyphoscoliosis 04/14/2017  . Asthma 02/24/2017  . Chronic respiratory failure with hypoxia and hypercapnia ([place]) 02/24/2017  . Pancreatic cyst 02/22/2017  . Tremor 01/20/2017  . Hypoxia 11/20/2016  . Loss of weight 09/16/2016  . Chronic diastolic heart failure ([place]) 11/11/2015  . Neck pain 01/09/2015  . Advance care planning 01/04/2015  . Risk for falls 08/24/2013  . Cardiomyopathy, ischemic 08/24/2013  . Left tibial fracture 05/05/2013  . Coronary artery disease   . Hypercholesterolemia   . Chronic kidney disease, stage III (moderate) 08/12/2012  . Colostomy in place [facility]) 08/12/2012  . Vitamin D deficiency 11/28/2008  . CAROTID BRUIT 05/24/2008  . Iron deficiency anemia 04/20/2007  . Type 2 diabetes mellitus with vascular disease ([place]) 04/19/2007  . HLD (hyperlipidemia) 04/19/2007  . Essential hypertension 04/19/2007  . Atrial fibrillation ([place]) 04/19/2007  . PAD (peripheral artery disease) ([place]) 04/19/2007  . OSTEOPOROSIS 04/19/2007   PCP:  Tonia Ghent, MD Pharmacy:   [id number] [place] [address] [id number]: [phone]: [phone]    Social Determinants of Health (SDOH) Interventions    Readmission Risk Interventions No flowsheet data found.

## 2020-02-21 NOTE — NC FL2 (Signed)
[place] LEVEL OF CARE SCREENING TOOL     IDENTIFICATION  Patient Name: [name] Birthdate: 03-Jan-1935 Sex: female Admission Date (Current Location): 02/16/2020  [facility] and [place] Number:  Herbalist and Address:  [facility] [place], [place] [address], [place], [place] [address]      Provider Number: [id number]  Attending Physician Name and Address:  Charlynne Cousins, MD  Relative Name and Phone Number:       Current Level of Care: Hospital Recommended Level of Care: [place] Prior Approval Number:    Date Approved/Denied:   PASRR Number: [id number]  Discharge Plan: SNF    Current Diagnoses: Patient Active Problem List   Diagnosis Date Noted  . Multifocal pneumonia 02/17/2020  . Acute on chronic respiratory failure with hypoxia ([place]) 02/17/2020  . Oral thrush 01/14/2020  . Cough 01/13/2018  . Medicare annual wellness visit, subsequent 09/09/2017  . Renal cyst 09/09/2017  . Sleep related hypoxia 06/02/2017  . Fall at home 05/27/2017  . Abnormal CBC 05/27/2017  . Sleep apnea 04/14/2017  . Restrictive lung disease due to kyphoscoliosis 04/14/2017  . Asthma 02/24/2017  . Chronic respiratory failure with hypoxia and hypercapnia ([place]) 02/24/2017  . Pancreatic cyst 02/22/2017  . Tremor 01/20/2017  . Hypoxia 11/20/2016  . Loss of weight 09/16/2016  . Chronic diastolic heart failure ([place]) 11/11/2015  . Neck pain 01/09/2015  . Advance care planning 01/04/2015  . Risk for falls 08/24/2013  . Cardiomyopathy, ischemic 08/24/2013  . Left tibial fracture 05/05/2013  . Coronary artery disease   . Hypercholesterolemia   . Chronic kidney disease, stage III (moderate) 08/12/2012  . Colostomy in place [facility]) 08/12/2012  . Vitamin D deficiency 11/28/2008  . CAROTID BRUIT 05/24/2008  . Iron deficiency anemia 04/20/2007  . Type 2 diabetes mellitus with vascular disease ([place]) 04/19/2007  . HLD (hyperlipidemia)  04/19/2007  . Essential hypertension 04/19/2007  . Atrial fibrillation ([place]) 04/19/2007  . PAD (peripheral artery disease) ([place]) 04/19/2007  . OSTEOPOROSIS 04/19/2007    Orientation RESPIRATION BLADDER Height & Weight     Self, Time, Situation, Place  O2(Nasal Cannula) Incontinent, External catheter Weight: 115 lb 15.4 oz (52.6 kg) Height:  5\' 2"  (157.5 cm)  BEHAVIORAL SYMPTOMS/MOOD NEUROLOGICAL BOWEL NUTRITION STATUS      Colostomy(Ostomy pouch) Diet(See discharge summary)  AMBULATORY STATUS COMMUNICATION OF NEEDS Skin   Extensive Assist Verbally Normal                       Personal Care Assistance Level of Assistance  Bathing, Feeding, Dressing Bathing Assistance: Maximum assistance Feeding assistance: Limited assistance Dressing Assistance: Maximum assistance     Functional Limitations Info  Sight, Hearing, Speech Sight Info: Impaired Hearing Info: Adequate Speech Info: Adequate    SPECIAL CARE FACTORS FREQUENCY  PT (By licensed PT), OT (By licensed OT)     PT Frequency: 5x a week OT Frequency: 5x a week            Contractures Contractures Info: Not present    Additional Factors Info  Code Status, Allergies Code Status Info: Partial Allergies Info: Actos (pioglitazone); Metformin And Related; Zocor (simvastatin)           Current Medications (02/21/2020):  This is the current hospital active medication list Current Facility-Administered Medications  Medication Dose Route Frequency Provider Last Rate Last Admin  . acetaminophen (TYLENOL) tablet 650 mg  650 mg Oral Q6H PRN Olalere, Adewale  A, MD   650 mg at 02/19/20 1342  . albuterol (PROVENTIL) (2.5 MG/3ML) 0.083% nebulizer solution 3 mL  3 mL Inhalation Q4H PRN Etta Quill, DO      . amLODipine (NORVASC) tablet 10 mg  10 mg Oral Daily Olalere, Adewale A, MD   10 mg at 02/21/20 0926  . aspirin chewable tablet 81 mg  81 mg Oral Daily Olalere, Adewale A, MD   81 mg at 02/21/20 0817  .  atorvastatin (LIPITOR) tablet 80 mg  80 mg Oral q1800 Olalere, Adewale A, MD   80 mg at 02/20/20 1732  . benazepril (LOTENSIN) tablet 10 mg  10 mg Oral Daily Olalere, Adewale A, MD   10 mg at 02/21/20 0926  . budesonide (PULMICORT) nebulizer solution 0.25 mg  0.25 mg Nebulization BID Jennette Kettle M, DO   0.25 mg at 02/21/20 [id number]  . cholecalciferol (VITAMIN D3) tablet 1,000 Units  1,000 Units Oral BID Ander Slade, Adewale A, MD   1,000 Units at 02/21/20 1103  . dextrose 50 % solution           . docusate sodium (COLACE) capsule 100 mg  100 mg Oral BID Olalere, Adewale A, MD   100 mg at 02/21/20 1103  . enoxaparin (LOVENOX) injection 30 mg  30 mg Subcutaneous Q24H Olalere, Adewale A, MD   30 mg at 02/20/20 1416  . famotidine (PEPCID) IVPB 20 mg premix  20 mg Intravenous Q24H Cristal Generous, NP 100 mL/hr at 02/21/20 0818 20 mg at 02/21/20 0818  . fluconazole (DIFLUCAN) IVPB 100 mg  100 mg Intravenous Q24H Charlynne Cousins, MD 50 mL/hr at 02/21/20 1107 100 mg at 02/21/20 1107  . furosemide (LASIX) injection 40 mg  40 mg Intravenous Daily Patrecia Pour, MD   40 mg at 02/21/20 0817  . MEDLINE mouth rinse  15 mL Mouth Rinse q12n4p Olalere, Adewale A, MD   15 mL at 02/21/20 1108  . polyethylene glycol (MIRALAX / GLYCOLAX) packet 17 g  17 g Oral Daily Olalere, Adewale A, MD   17 g at 02/21/20 1102  . polyvinyl alcohol (LIQUIFILM TEARS) 1.4 % ophthalmic solution   Both Eyes Daily PRN Etta Quill, DO      . ranolazine (RANEXA) 12 hr tablet 500 mg  500 mg Oral BID Jennette Kettle M, DO   500 mg at 02/21/20 0926  . sodium chloride flush (NS) 0.9 % injection 3 mL  3 mL Intravenous Once Quintella Reichert, MD   Stopped at 02/16/20 2253  . sotalol (BETAPACE) tablet 40 mg  40 mg Oral BID Olalere, Adewale A, MD   40 mg at 02/21/20 [id number]     Discharge Medications: Please see discharge summary for a list of discharge medications.  Relevant Imaging Results:  Relevant Lab Results:   Additional  Information SSN [id number]  Arvella Merles, [place]

## 2020-02-21 NOTE — Progress Notes (Signed)
Cpap brought to PTs room she stated shes resting fine without for the moment, she was instructed to call if she needed It on.

## 2020-02-21 NOTE — Progress Notes (Signed)
Patient has no recorded urinary output since 02/20/20 at 1200, bladder scanned this a.m. that resulted in 244 mL. Patient's blood sugar fell to a critical level of 22 mg/dL at 0650 on 02/21/20. IV Dextrose was administered 25 mL and one cup of orange juice. Patient's blood sugar rechecked 15 minutes after administration that resulted in 107 mg/dL. Patient is stable and resting, alert and oriented x 4; report given to day shift RN.  Elaina Hoops, RN

## 2020-02-22 ENCOUNTER — Inpatient Hospital Stay (HOSPITAL_COMMUNITY): Payer: Medicare Other

## 2020-02-22 LAB — GLUCOSE, CAPILLARY
Glucose-Capillary: 149 mg/dL — ABNORMAL HIGH (ref 70–99)
Glucose-Capillary: 38 mg/dL — CL (ref 70–99)
Glucose-Capillary: 140 mg/dL — ABNORMAL HIGH (ref 70–99)
Glucose-Capillary: 127 mg/dL — ABNORMAL HIGH (ref 70–99)
Glucose-Capillary: 78 mg/dL (ref 70–99)
Glucose-Capillary: 147 mg/dL — ABNORMAL HIGH (ref 70–99)
Glucose-Capillary: 190 mg/dL — ABNORMAL HIGH (ref 70–99)
Glucose-Capillary: 197 mg/dL — ABNORMAL HIGH (ref 70–99)

## 2020-02-22 LAB — CBC WITH DIFFERENTIAL/PLATELET
Abs Immature Granulocytes: 0 10*3/uL (ref 0.00–0.07)
Band Neutrophils: 1 %
Basophils Absolute: 0 10*3/uL (ref 0.0–0.1)
Basophils Relative: 0 %
Eosinophils Absolute: 0.1 10*3/uL (ref 0.0–0.5)
Eosinophils Relative: 1 %
HCT: 32.5 % — ABNORMAL LOW (ref 36.0–46.0)
Hemoglobin: 10.1 g/dL — ABNORMAL LOW (ref 12.0–15.0)
Lymphocytes Relative: 6 %
Lymphs Abs: 0.7 10*3/uL (ref 0.7–4.0)
MCH: 29.4 pg (ref 26.0–34.0)
MCHC: 31.1 g/dL (ref 30.0–36.0)
MCV: 94.5 fL (ref 80.0–100.0)
Monocytes Absolute: 2.1 10*3/uL — ABNORMAL HIGH (ref 0.1–1.0)
Monocytes Relative: 19 %
Neutro Abs: 8.4 10*3/uL — ABNORMAL HIGH (ref 1.7–7.7)
Neutrophils Relative %: 73 %
Platelets: 212 10*3/uL (ref 150–400)
RBC: 3.44 MIL/uL — ABNORMAL LOW (ref 3.87–5.11)
RDW: 15 % (ref 11.5–15.5)
WBC: 11.3 10*3/uL — ABNORMAL HIGH (ref 4.0–10.5)
nRBC: 0.2 % (ref 0.0–0.2)

## 2020-02-22 LAB — BASIC METABOLIC PANEL
Anion gap: 13 (ref 5–15)
BUN: 27 mg/dL — ABNORMAL HIGH (ref 8–23)
CO2: 36 mmol/L — ABNORMAL HIGH (ref 22–32)
Calcium: 8.9 mg/dL (ref 8.9–10.3)
Chloride: 91 mmol/L — ABNORMAL LOW (ref 98–111)
Creatinine, Ser: 1.3 mg/dL — ABNORMAL HIGH (ref 0.44–1.00)
GFR calc Af Amer: 44 mL/min — ABNORMAL LOW (ref >60)
GFR calc non Af Amer: 38 mL/min — ABNORMAL LOW (ref >60)
Glucose, Bld: 195 mg/dL — ABNORMAL HIGH (ref 70–99)
Potassium: 4.2 mmol/L (ref 3.5–5.1)
Sodium: 140 mmol/L (ref 135–145)

## 2020-02-22 LAB — TRIGLYCERIDES: Triglycerides: 116 mg/dL (ref <150)

## 2020-02-22 LAB — MAGNESIUM: Magnesium: 1.8 mg/dL (ref 1.7–2.4)

## 2020-02-22 MED ORDER — DEXTROSE 50 % IV SOLN
INTRAVENOUS | Status: AC
  Administered 2020-02-22: 25 mL
  Filled 2020-02-22: qty 50

## 2020-02-22 MED ORDER — POLYETHYLENE GLYCOL 3350 17 G PO PACK
17.0000 g | PACK | Freq: Two times a day (BID) | ORAL | Status: AC
  Administered 2020-02-22 – 2020-02-24 (×4): 17 g via ORAL
  Filled 2020-02-22 (×4): qty 1

## 2020-02-22 NOTE — Progress Notes (Signed)
Physical Therapy Treatment Patient Details Name: [name] MRN: [id number] DOB: 1935-04-11 Today's Date: 02/22/2020    History of Present Illness Pt is an 85 yo female s/p SOB, hypoxia, multifocal PNA and heart failure. Pmhx: brast cancer chemo (current), DMT2, CAD, CKD stage II, Afib.    PT Comments    Pt required min assist bed mobility, min assist transfers and min assist + 2 safety/lines ambulation 45' with RW. Pt on 5L O2 at rest with SpO2 95-100%. Mobilized on 6L with desat to 89%. 2/4 DOE. Cues needed for pursed lip breathing after sitting in recliner. Pt is making good progress with mobility. Continue to recommend SNF at this time. Pt desires home with home health over SNF. She would need 24-hour assist and continued improvement in mobility status for HHPT to be appropriate. Of note, pt has 5 steps to enter her house. Pt may need further home DME, if she declines SNF, including wheelchair.    Follow Up Recommendations  SNF;Supervision/Assistance - 24 hour(Working toward HHPT and 24-hour assist.)     Equipment Recommendations  Rolling walker with 5" wheels    Recommendations for Other Services       Precautions / Restrictions Precautions Precautions: Fall;Other (comment) Precaution Comments: watch O2 and HR    Mobility  Bed Mobility Overal bed mobility: Needs Assistance Bed Mobility: Supine to Sit     Supine to sit: Min assist;HOB elevated     General bed mobility comments: +rail, increased time  Transfers Overall transfer level: Needs assistance Equipment used: Rolling walker (2 wheeled) Transfers: Sit to/from Stand Sit to Stand: Min assist         General transfer comment: cues for hand placement, assist to power up  Ambulation/Gait Ambulation/Gait assistance: Min assist;+2 safety/equipment Gait Distance (Feet): 45 Feet Assistive device: Rolling walker (2 wheeled) Gait Pattern/deviations: Step-through pattern;Decreased stride length;Trunk  flexed Gait velocity: decreased Gait velocity interpretation: <1.8 ft/sec, indicate of risk for recurrent falls General Gait Details: Pt ambulated on 6L O2 with desat to 89%. 2/4 DOE noted. Quick recovery upon sitting in recliner. Cues needed for pursed lip breathing. Pt tends to be a mouth breather. +2 utilized in session to manage lines and portable O2.   Stairs             Wheelchair Mobility    Modified Rankin (Stroke Patients Only)       Balance Overall balance assessment: Needs assistance Sitting-balance support: Feet supported;Single extremity supported Sitting balance-Leahy Scale: Fair     Standing balance support: Bilateral upper extremity supported;During functional activity Standing balance-Leahy Scale: Poor Standing balance comment: reliant on external support                            Cognition Arousal/Alertness: Awake/alert Behavior During Therapy: WFL for tasks assessed/performed Overall Cognitive Status: Within Functional Limits for tasks assessed                                        Exercises      General Comments General comments (skin integrity, edema, etc.): Pt on 5L O2 at rest. SpO2 95-100%.      Pertinent Vitals/Pain Pain Assessment: No/denies pain    Home Living                      Prior Function  PT Goals (current goals can now be found in the care plan section) Acute Rehab PT Goals Patient Stated Goal: to go home Progress towards PT goals: Progressing toward goals    Frequency    Min 2X/week      PT Plan Current plan remains appropriate    Co-evaluation              AM-PAC PT "6 Clicks" Mobility   Outcome Measure  Help needed turning from your back to your side while in a flat bed without using bedrails?: A Little Help needed moving from lying on your back to sitting on the side of a flat bed without using bedrails?: A Little Help needed moving to and from a  bed to a chair (including a wheelchair)?: A Little Help needed standing up from a chair using your arms (e.g., wheelchair or bedside chair)?: A Little Help needed to walk in hospital room?: A Little Help needed climbing 3-5 steps with a railing? : A Lot 6 Click Score: 17    End of Session Equipment Utilized During Treatment: Gait belt;Oxygen Activity Tolerance: Patient tolerated treatment well Patient left: in chair;with call bell/phone within reach Nurse Communication: Mobility status PT Visit Diagnosis: Other abnormalities of gait and mobility (R26.89);Muscle weakness (generalized) (M62.81);Difficulty in walking, not elsewhere classified (R26.2)     Time: [id number] PT Time Calculation (min) (ACUTE ONLY): 26 min  Charges:  $Gait Training: 23-37 mins                     Lorrin Goodell, [place]  Office # [phone] Pager [place], [place] 02/22/2020, 1:14 PM

## 2020-02-22 NOTE — Plan of Care (Signed)
  Problem: Clinical Measurements: Goal: Cardiovascular complication will be avoided Outcome: Progressing   Problem: Pain Managment: Goal: General experience of comfort will improve Outcome: Progressing   

## 2020-02-22 NOTE — Progress Notes (Signed)
[facility] PROGRESS NOTE    Progress Note  IBET WAAG  [id number] DOB: [birth year] DOA: 02/16/2020 PCP: Tonia Ghent, MD     Brief Narrative:   MALAISHA Farrell is an 85 y.o. female past medical history of chronic atrial fibrillation, not on anticoagulation due to spontaneous retroperitoneal bleed, chronic diastolic heart failure with the most recent echo in 2017, chronic respiratory failure on 2 L of oxygen, restrictive lung disease from kyphosis, history of breast cancer diabetes mellitus type 2 and chronic kidney disease stage II presents to the ED with shortness of breath that started 10 days prior to admission progressively getting worst, saw her cardiologist on 02/06/2020 they felt her bilateral lower extremity and was minimal.  Was started empirically and treated for possible multifocal pneumonia on 01/17/2020 intubated on 02/19/2020 and extubated on 02/20/2020  Assessment/Plan:   Acute on chronic respiratory failure with hypercarbia secondarily to acute decompensated diastolic heart failure: In the setting of acute heart failure exacerbation, underlying restrictive lung disease with sleep disorder. She self extubated herself on 02/20/2020.  Empiric antibiotics were discontinued on 02/20/2020. With good urine output negative about 2, she is 52.4 kg this morning. Continues to require greater than her baseline of 2 L, this morning she is on 3. No wheezing on physical exam appears fluid overloaded, continue IV diuresis. Basic metabolic panel is pending.  Acute encephalopathy: Likely in the setting of hypercarbia and hypoxia mentation has been improved, appears to be at baseline. CT scan of the head is negative. Continue to avoid sedation.  Mild leukocytosis: Likely reactive has remained afebrile discontinue antibiotics.  Acute kidney injury on chronic kidney disease stage III: With a baseline creatinine of less than 1, peaked at 1.4, is clearly improving with IV  diuresis. Continue IV diuresis basic metabolic panel is pending this morning.  Electrolyte imbalance, hypomagnesemia/hyperkalemia: Treated and resolved.  Diabetes mellitus type 2: Currently on minimal insulin had an episode of hypoglycemia last night, will discontinue short acting insulin.  She is on glyburide and short acting insulin at home.  Essential hypertension:  Oral thrush: Has cleared she relates she no longer has odynophagia.   DVT prophylaxis: lovenox Family Communication:Son Status is: Inpatient  Remains inpatient appropriate because:IV treatments appropriate due to intensity of illness or inability to take PO   Dispo: The patient is from: Home              Anticipated d/c is to: SNF              Anticipated d/c date is: 3 days              Patient currently is not medically stable to d/c.  Code Status:     Code Status Orders  (From admission, onward)         Start     Ordered   02/19/20 1450  Limited resuscitation (code)  Continuous    Question Answer Comment  In the event of cardiac or respiratory ARREST: Initiate Code Blue, Call Rapid Response No   In the event of cardiac or respiratory ARREST: Perform CPR No   In the event of cardiac or respiratory ARREST: Perform Intubation/Mechanical Ventilation Yes   In the event of cardiac or respiratory ARREST: Use NIPPV/BiPAp only if indicated Yes   In the event of cardiac or respiratory ARREST: Administer ACLS medications if indicated No   In the event of cardiac or respiratory ARREST: Perform Defibrillation or Cardioversion if indicated No   Comments  Presently intubated      02/19/20 1450        Code Status History    Date Active Date Inactive Code Status Order ID Comments User Context   02/17/2020 0051 02/19/2020 1450 Full Code [id number]  Etta Quill, DO ED   11/20/2016 1532 11/25/2016 1656 Full Code [id number]  Radene Gunning, NP ED   10/09/2012 1834 10/12/2012 2057 Full Code [id number]  Vilinda Blanks, RN ED   Advance Care Planning Activity        IV Access:    Peripheral IV   Procedures and diagnostic studies:   No results found.   Medical Consultants:    None.  Anti-Infectives:   doxycycline  Subjective:    [name] relates her breathing is improved compared to on admission.  Objective:    Vitals:   02/22/20 0106 02/22/20 0443 02/22/20 0746 02/22/20 0804  BP: 119/62 116/60 130/61   Pulse: 68 68 66   Resp: 20 16 20    Temp: 97.7 F (36.5 C) 97.6 F (36.4 C) 98.2 F (36.8 C)   TempSrc: Oral Oral Oral   SpO2: 90% (!) 89% 98% 97%  Weight:  52.4 kg    Height:       SpO2: 97 % O2 Flow Rate (L/min): 5 L/min FiO2 (%): 40 %   Intake/Output Summary (Last 24 hours) at 02/22/2020 1027 Last data filed at 02/22/2020 0730 Gross per 24 hour  Intake 120 ml  Output 1100 ml  Net -980 ml   Filed Weights   02/20/20 0500 02/21/20 0354 02/22/20 0443  Weight: 51.4 kg 52.6 kg 52.4 kg    Exam: General exam: In no acute distress. Respiratory system: Good air movement and crackles more pronounced on the right than on the left. Cardiovascular system: S1 & S2 heard, RRR.  Positive JVD Gastrointestinal system: Abdomen is nondistended, soft and nontender.  Extremities: No pedal edema. Skin: No rashes, lesions or ulcers  Data Reviewed:    Labs: Basic Metabolic Panel: Recent Labs  Lab 02/18/20 0231 02/18/20 0231 02/19/20 0030 02/19/20 0030 02/19/20 0142 02/19/20 0142 02/19/20 0202 02/19/20 0202 02/20/20 0909 02/21/20 0332  NA 141   < > 139  --  137  --  137  --  139 140  K 4.6   < > 5.7*   < > 5.4*   < > 4.4   < > 3.7 4.5  CL 97*  --  92*  --  91*  --   --   --  93* 94*  CO2 34*  --  32  --  31  --   --   --  34* 33*  GLUCOSE 83  --  175*  --  390*  --   --   --  146* 22*  BUN 22  --  33*  --  34*  --   --   --  29* 31*  CREATININE 1.11*  --  1.47*  --  1.48*  --   --   --  1.35* 1.23*  CALCIUM 9.5  --  9.2  --  8.7*  --   --   --  9.1  9.0  MG  --   --   --   --  1.5*  --   --   --  1.8  --   PHOS  --   --   --   --  5.0*  --   --   --   --   --    < > =  values in this interval not displayed.   GFR Estimated Creatinine Clearance: 27.9 mL/min (A) (by C-G formula based on SCr of 1.23 mg/dL (H)). Liver Function Tests: Recent Labs  Lab 02/19/20 0142  AST 17  ALT 11  ALKPHOS 54  BILITOT 0.9  PROT 5.5*  ALBUMIN 2.8*   No results for input(s): LIPASE, AMYLASE in the last 168 hours. No results for input(s): AMMONIA in the last 168 hours. Coagulation profile No results for input(s): INR, PROTIME in the last 168 hours. COVID-19 Labs  No results for input(s): DDIMER, FERRITIN, LDH, CRP in the last 72 hours.  Lab Results  Component Value Date   [place] NEGATIVE 02/16/2020    CBC: Recent Labs  Lab 02/17/20 0450 02/17/20 0450 02/18/20 0231 02/19/20 0142 02/19/20 0202 02/20/20 0909 02/21/20 0332  WBC 13.4*  --  13.5* 11.8*  --  22.6* 13.9*  HGB 10.1*   < > 11.5* 9.1* 10.5* 10.5* 10.4*  HCT 34.1*   < > 39.1 30.4* 31.0* 34.2* 33.5*  MCV 98.0  --  98.2 97.1  --  94.0 94.6  PLT 251  --  253 191  --  224 206   < > = values in this interval not displayed.   Cardiac Enzymes: No results for input(s): CKTOTAL, CKMB, CKMBINDEX, TROPONINI in the last 168 hours. BNP (last 3 results) Recent Labs    02/06/20 1449  PROBNP 985*   CBG: Recent Labs  Lab 02/22/20 0102 02/22/20 0122 02/22/20 0235 02/22/20 0441 02/22/20 0743  GLUCAP 38* 149* 140* 127* 78   D-Dimer: No results for input(s): DDIMER in the last 72 hours. Hgb A1c: No results for input(s): HGBA1C in the last 72 hours. Lipid Profile: Recent Labs    02/22/20 0116  TRIG 116   Thyroid function studies: No results for input(s): TSH, T4TOTAL, T3FREE, THYROIDAB in the last 72 hours.  Invalid input(s): FREET3 Anemia work up: No results for input(s): VITAMINB12, FOLATE, FERRITIN, TIBC, IRON, RETICCTPCT in the last 72 hours. Sepsis Labs: Recent  Labs  Lab 02/17/20 0102 02/17/20 0450 02/18/20 0231 02/19/20 0142 02/19/20 0419 02/20/20 0909 02/21/20 0332  PROCALCITON <0.10  --   --   --   --   --   --   WBC  --    < > 13.5* 11.8*  --  22.6* 13.9*  LATICACIDVEN  --   --   --  1.8 2.3*  --   --    < > = values in this interval not displayed.   Microbiology Recent Results (from the past 240 hour(s))  SARS Coronavirus 2 by RT PCR (hospital order, performed in [facility] hospital lab) Nasopharyngeal Nasopharyngeal Swab     Status: None   Collection Time: 02/16/20 11:32 PM   Specimen: Nasopharyngeal Swab  Result Value Ref Range Status   SARS Coronavirus 2 NEGATIVE NEGATIVE Final    Comment: (NOTE) SARS-CoV-2 target nucleic acids are NOT DETECTED. The SARS-CoV-2 RNA is generally detectable in upper and lower respiratory specimens during the acute phase of infection. The lowest concentration of SARS-CoV-2 viral copies this assay can detect is 250 copies / mL. A negative result does not preclude SARS-CoV-2 infection and should not be used as the sole basis for treatment or other patient management decisions.  A negative result may occur with improper specimen collection / handling, submission of specimen other than nasopharyngeal swab, presence of viral mutation(s) within the areas targeted by this assay, and inadequate number of viral copies (<250 copies /  mL). A negative result must be combined with clinical observations, patient history, and epidemiological information. Fact Sheet for Patients:   StrictlyIdeas.no Fact Sheet for Healthcare Providers: BankingDealers.co.za This test is not yet approved or cleared  by the [place] FDA and has been authorized for detection and/or diagnosis of SARS-CoV-2 by FDA under an Emergency Use Authorization (EUA).  This EUA will remain in effect (meaning this test can be used) for the duration of the COVID-19 declaration under Section  564(b)(1) of the Act, 21 U.S.C. section 360bbb-3(b)(1), unless the authorization is terminated or revoked sooner. Performed at [facility], [place] [address], [place], [place] [address]   MRSA PCR Screening     Status: None   Collection Time: 02/19/20  1:13 AM   Specimen: Nasopharyngeal  Result Value Ref Range Status   MRSA by PCR NEGATIVE NEGATIVE Final    Comment:        The GeneXpert MRSA Assay (FDA approved for NASAL specimens only), is one component of a comprehensive MRSA colonization surveillance program. It is not intended to diagnose MRSA infection nor to guide or monitor treatment for MRSA infections. Performed at [facility], [place] [address] [place], [place] [address]      Medications:   . amLODipine  10 mg Oral Daily  . aspirin  81 mg Oral Daily  . atorvastatin  80 mg Oral q1800  . benazepril  10 mg Oral Daily  . budesonide (PULMICORT) nebulizer solution  0.25 mg Nebulization BID  . cholecalciferol  1,000 Units Oral BID  . docusate sodium  100 mg Oral BID  . enoxaparin (LOVENOX) injection  30 mg Subcutaneous Q24H  . furosemide  40 mg Intravenous Daily  . mouth rinse  15 mL Mouth Rinse q12n4p  . polyethylene glycol  17 g Oral Daily  . ranolazine  500 mg Oral BID  . sodium chloride flush  3 mL Intravenous Once  . sotalol  40 mg Oral BID   Continuous Infusions: . famotidine (PEPCID) IV 20 mg (02/21/20 0818)  . fluconazole (DIFLUCAN) IV 100 mg (02/21/20 1107)      LOS: 5 days   Charlynne Cousins  [facility]  02/22/2020, 10:27 AM

## 2020-02-22 NOTE — TOC Progression Note (Signed)
Transition of Care [facility]) - Progression Note    Patient Details  Name: SYDNEE RIDINGER MRN: [id number] Date of Birth: 1934-11-16  Transition of Care [facility]) CM/SW [place], [place] Phone Number: 02/22/2020, 11:00 AM  Clinical Narrative:     CSW spoke with patient at bedside. Patient confirmed with CSW that she is leaning towards Home Health as a first option and will keep SNF as a second option. CSW provided patient with SNF bed offers from [place] area.  Patient gave CSW permission to fax out initial referral to [facility] area for possible SNF placement. Patient wants to speak with case manager about [place] before making her final decision. CSW reached out to case manager to stop by and see patient. CSW let patient know she will stop back by this afternoon after she speaks with case manager to see what her final decision is.  CSW will continue to follow.   Expected Discharge Plan: [place] Barriers to Discharge: Continued Medical Work up  Expected Discharge Plan and Services Expected Discharge Plan: [place] arrangements for the past 2 months: Single Family Home                                       Social Determinants of Health (SDOH) Interventions    Readmission Risk Interventions No flowsheet data found.

## 2020-02-22 NOTE — Progress Notes (Signed)
Hypoglycemic Event  CBG: 38 Time: 0102  Treatment: IV Dextrose 25 mL Symptoms: Lethargic  Follow-up CBG: 149 Time: 0122 CBG Result:  Possible Reasons for Event: Low Intake  Comments/MD notified:     Elaina Hoops

## 2020-02-23 ENCOUNTER — Other Ambulatory Visit (HOSPITAL_COMMUNITY): Payer: Medicare Other

## 2020-02-23 LAB — BASIC METABOLIC PANEL
Anion gap: 7 (ref 5–15)
BUN: 27 mg/dL — ABNORMAL HIGH (ref 8–23)
CO2: 41 mmol/L — ABNORMAL HIGH (ref 22–32)
Calcium: 9 mg/dL (ref 8.9–10.3)
Chloride: 94 mmol/L — ABNORMAL LOW (ref 98–111)
Creatinine, Ser: 1.19 mg/dL — ABNORMAL HIGH (ref 0.44–1.00)
GFR calc Af Amer: 49 mL/min — ABNORMAL LOW (ref >60)
GFR calc non Af Amer: 42 mL/min — ABNORMAL LOW (ref >60)
Glucose, Bld: 114 mg/dL — ABNORMAL HIGH (ref 70–99)
Potassium: 4 mmol/L (ref 3.5–5.1)
Sodium: 142 mmol/L (ref 135–145)

## 2020-02-23 LAB — GLUCOSE, CAPILLARY
Glucose-Capillary: 131 mg/dL — ABNORMAL HIGH (ref 70–99)
Glucose-Capillary: 155 mg/dL — ABNORMAL HIGH (ref 70–99)
Glucose-Capillary: 185 mg/dL — ABNORMAL HIGH (ref 70–99)
Glucose-Capillary: 228 mg/dL — ABNORMAL HIGH (ref 70–99)
Glucose-Capillary: 93 mg/dL (ref 70–99)
Glucose-Capillary: 95 mg/dL (ref 70–99)

## 2020-02-23 LAB — SARS CORONAVIRUS 2 BY RT PCR (HOSPITAL ORDER, PERFORMED IN ~~LOC~~ HOSPITAL LAB): SARS Coronavirus 2: NEGATIVE

## 2020-02-23 MED ORDER — DM-GUAIFENESIN ER 30-600 MG PO TB12
1.0000 | ORAL_TABLET | Freq: Two times a day (BID) | ORAL | Status: DC
  Administered 2020-02-23 – 2020-02-24 (×3): 1 via ORAL
  Filled 2020-02-23 (×3): qty 1

## 2020-02-23 MED ORDER — FUROSEMIDE 20 MG PO TABS
20.0000 mg | ORAL_TABLET | Freq: Every day | ORAL | Status: DC
  Administered 2020-02-23: 20 mg via ORAL
  Filled 2020-02-23: qty 1

## 2020-02-23 NOTE — Progress Notes (Signed)
Patient placed on CPAP of 10 cmH2O with 5 L O2 bleed in.  RT placed patient glasses in case at bedside and patient is tolerating CPAP at this time. RN notified.

## 2020-02-23 NOTE — Progress Notes (Signed)
Occupational Therapy Treatment Patient Details Name: [name] MRN: [id number] DOB: 1935-04-01 Today's Date: 02/23/2020    History of present illness Pt is an 85 yo female s/p SOB, hypoxia, multifocal PNA and heart failure. Pmhx: brast cancer chemo (current), DMT2, CAD, CKD stage II, Afib.   OT comments  Pt agreeable to OT tx today, pt sitting upright in bed upon arrival. Pt denied pain. Assessment of bed mobility, functional transfers/mobility, ADL function- grooming, dressing, sitting/standing balance, and endurance. Pt required repetition of basic commands inconsistently but appeared awake/alert/oriented. Pt required min assist for functional transfers/ambulation throughout bedroom using RW and safety cues. Pt setup for grooming sitting EOB and adjusted bilateral socks without difficulty. Educated pt on deep breathing techniques, correct hand placement for functional transfers, and self-advocacy. Pt required min guard for simulated toilet transfer from bed>bedside chair. Son present at end of session. Notified nurse of full colostomy. Recommend SNF for discharge. Will continue to follow acutely as able.   Follow Up Recommendations  SNF;Supervision/Assistance - 24 hour    Equipment Recommendations  None recommended by OT    Recommendations for Other Services      Precautions / Restrictions Precautions Precautions: Fall;Other (comment) Precaution Comments: watch O2 and HR Restrictions Weight Bearing Restrictions: No       Mobility Bed Mobility Overal bed mobility: Needs Assistance Bed Mobility: Supine to Sit     Supine to sit: Min assist;HOB elevated     General bed mobility comments: light min A for trunk to upright, heavy use of bed rail and momentum, c/o of dizziness in upright, no appreciable change in BP  Transfers Overall transfer level: Needs assistance Equipment used: Rolling walker (2 wheeled) Transfers: Sit to/from Stand Sit to Stand: Min assist          General transfer comment: min A for power up and steadying, vc for scooting hips forward prior to power up, requires 2 attempts to reach upright, where she braced herself with posterior lean of LE on bed behind her    Balance Overall balance assessment: Needs assistance Sitting-balance support: Feet supported;Single extremity supported Sitting balance-Leahy Scale: Fair     Standing balance support: Bilateral upper extremity supported;During functional activity Standing balance-Leahy Scale: Poor Standing balance comment: reliant on external support                           ADL either performed or assessed with clinical judgement   ADL Overall ADL's : Needs assistance/impaired     Grooming: Wash/dry face;Wash/dry hands;Set up;Sitting               Lower Body Dressing: Supervision/safety;Cueing for safety;Sitting/lateral leans(adjusted bilateral socks (pulled halfway up) sitting EOB)   Toilet Transfer: Minimal assistance;Cueing for safety;Ambulation;RW(simulated toilet transfer from bed to recliner)             General ADL Comments: Pt limited by decreased activity tolerance, decreased ability to care for self and decreased strength. Pt appears to have supportive family, but is not able to properly care for self to go home without 24/7 assist at this time. Would benefit from SNF.     Vision       Perception     Praxis      Cognition Arousal/Alertness: Awake/alert Behavior During Therapy: WFL for tasks assessed/performed Overall Cognitive Status: Within Functional Limits for tasks assessed  Exercises     Shoulder Instructions       General Comments participated in self-care and functional mobility activities on 6L of supplemental oxygen; social worker present with paperwork; son present at end of session    Pertinent Vitals/ Pain       Pain Assessment: No/denies pain  Home Living                                           Prior Functioning/Environment              Frequency  Min 2X/week        Progress Toward Goals  OT Goals(current goals can now be found in the care plan section)  Progress towards OT goals: Progressing toward goals  Acute Rehab OT Goals Patient Stated Goal: to go home  Plan Discharge plan remains appropriate    Co-evaluation    PT/OT/SLP Co-Evaluation/Treatment: Yes Reason for Co-Treatment: Complexity of the patient's impairments (multi-system involvement);For patient/therapist safety;To address functional/ADL transfers          AM-PAC OT "6 Clicks" Daily Activity     Outcome Measure   Help from another person eating meals?: A Little Help from another person taking care of personal grooming?: A Little Help from another person toileting, which includes using toliet, bedpan, or urinal?: A Lot Help from another person bathing (including washing, rinsing, drying)?: A Lot Help from another person to put on and taking off regular upper body clothing?: A Little Help from another person to put on and taking off regular lower body clothing?: A Lot 6 Click Score: 15    End of Session Equipment Utilized During Treatment: Gait belt;Rolling walker;Oxygen  OT Visit Diagnosis: Unsteadiness on feet (R26.81);Muscle weakness (generalized) (M62.81)   Activity Tolerance Patient tolerated treatment well   Patient Left in chair;with call bell/phone within reach;with chair alarm set   Nurse Communication Mobility status;Other (comment)(notified nurse that colostomy needed to be emptied )        Time: [id number] OT Time Calculation (min): 55 min  Charges: OT General Charges $OT Visit: 1 Visit OT Treatments $Self Care/Home Management : 23-37 mins  Michel Bickers, OTR/L [facility] [place] 02/23/2020, 5:01 PM

## 2020-02-23 NOTE — Progress Notes (Signed)
[facility] PROGRESS NOTE    Progress Note  [name]  [id number] DOB: 1935/07/24 DOA: 02/16/2020 PCP: Tonia Ghent, MD     Brief Narrative:   [name] is an 85 y.o. female past medical history of chronic atrial fibrillation, not on anticoagulation due to spontaneous retroperitoneal bleed, chronic diastolic heart failure with the most recent echo in 2017, chronic respiratory failure on 2 L of oxygen, restrictive lung disease from kyphosis, history of breast cancer diabetes mellitus type 2 and chronic kidney disease stage II presents to the ED with shortness of breath that started 10 days prior to admission progressively getting worst, saw her cardiologist on 02/06/2020 they felt her bilateral lower extremity and was minimal.  Was started empirically and treated for possible multifocal pneumonia on 01/17/2020 intubated on 02/19/2020 and extubated on 02/20/2020  Assessment/Plan:   Acute on chronic respiratory failure with hypercarbia secondarily to acute decompensated diastolic heart failure and community acquired pneumonia: In the setting of acute heart failure exacerbation, underlying restrictive lung disease with sleep disorder. She has completed her course of IV antibiotics. She self extubated herself on 02/20/2020.   With good urine output negative about 2, she is 51 kg this morning. Continues to require greater than her baseline of 2 L, this morning she is on 3. No wheezing on physical exam appears fluid overloaded, continue IV diuresis. Creatinine is improved resume her oral Lasix. Saturations goal is above 88%, she does not need to be greater than 95%.  Acute encephalopathy: Likely in the setting of hypercarbia and hypoxia mentation has been improved, appears to be at baseline. CT scan of the head is negative. Continue to avoid sedation.  Mild leukocytosis: Likely reactive has remained afebrile discontinue antibiotics.  Acute kidney injury on chronic kidney  disease stage III: With a baseline creatinine of less than 1, peaked at 1.4. Diuretics were held due to a rise in her creatinine, she will be restarted on low-dose diuretic  Electrolyte imbalance, hypomagnesemia/hyperkalemia: Treated and resolved.  Diabetes mellitus type 2: Currently on minimal insulin had an episode of hypoglycemia last night, will discontinue short acting insulin.  She is on glyburide and short acting insulin at home.  Essential hypertension:  Oral thrush: Has cleared she relates she no longer has odynophagia.   DVT prophylaxis: lovenox Family Communication:Son Status is: Inpatient  Remains inpatient appropriate because:IV treatments appropriate due to intensity of illness or inability to take PO   Dispo: The patient is from: Home              Anticipated d/c is to: SNF              Anticipated d/c date is: 1 days              Patient currently is medically stable to d/c.  Code Status:     Code Status Orders  (From admission, onward)         Start     Ordered   02/19/20 1450  Limited resuscitation (code)  Continuous    Question Answer Comment  In the event of cardiac or respiratory ARREST: Initiate Code Blue, Call Rapid Response No   In the event of cardiac or respiratory ARREST: Perform CPR No   In the event of cardiac or respiratory ARREST: Perform Intubation/Mechanical Ventilation Yes   In the event of cardiac or respiratory ARREST: Use NIPPV/BiPAp only if indicated Yes   In the event of cardiac or respiratory ARREST: Administer ACLS medications  if indicated No   In the event of cardiac or respiratory ARREST: Perform Defibrillation or Cardioversion if indicated No   Comments Presently intubated      02/19/20 1450        Code Status History    Date Active Date Inactive Code Status Order ID Comments User Context   02/17/2020 0051 02/19/2020 1450 Full Code [id number]  Etta Quill, DO ED   11/20/2016 1532 11/25/2016 1656 Full Code [id number]   Radene Gunning, NP ED   10/09/2012 1834 10/12/2012 2057 Full Code [id number]  Vilinda Blanks, RN ED   Advance Care Planning Activity        IV Access:    Peripheral IV   Procedures and diagnostic studies:   DG Abd 1 View  Result Date: 02/22/2020 CLINICAL DATA:  Abdominal distension EXAM: ABDOMEN - 1 VIEW COMPARISON:  02/19/2019 FINDINGS: Scattered large and small bowel gas is noted. No abnormal mass is seen. Calcifications are noted in the right mid abdomen consistent with cholelithiasis. Heavy vascular calcifications are noted. Left common iliac artery stent is seen. IMPRESSION: Cholelithiasis. No acute abnormality noted. Electronically Signed   By: Inez Catalina M.D.   On: 02/22/2020 14:29     Medical Consultants:    None.  Anti-Infectives:   doxycycline  Subjective:    [name] no complaints.  Objective:    Vitals:   02/23/20 0100 02/23/20 0522 02/23/20 0745 02/23/20 0836  BP: 122/60 125/62 (!) 129/57   Pulse: 63 61 63   Resp: 18 20 20    Temp: 97.9 F (36.6 C) (!) 97.5 F (36.4 C) (!) 97.5 F (36.4 C)   TempSrc: Oral Oral Oral   SpO2: 100% 99% 100% 97%  Weight:  51.9 kg    Height:       SpO2: 97 % O2 Flow Rate (L/min): 5 L/min FiO2 (%): 40 %   Intake/Output Summary (Last 24 hours) at 02/23/2020 1025 Last data filed at 02/22/2020 2100 Gross per 24 hour  Intake 240 ml  Output --  Net 240 ml   Filed Weights   02/21/20 0354 02/22/20 0443 02/23/20 0522  Weight: 52.6 kg 52.4 kg 51.9 kg    Exam: General exam: In no acute distress. Respiratory system: Good air movement and clear to auscultation. Cardiovascular system: S1 & S2 heard, RRR. No JVD. Gastrointestinal system: Abdomen is nondistended, soft and nontender.  Extremities: No pedal edema. Skin: No rashes, lesions or ulcers  Data Reviewed:    Labs: Basic Metabolic Panel: Recent Labs  Lab 02/19/20 0142 02/19/20 0142 02/19/20 0202 02/19/20 0202 02/20/20 [id number] 02/20/20 [id number]  02/21/20 0332 02/21/20 0332 02/22/20 1108 02/23/20 0339  NA 137   < > 137  --  139  --  140  --  140 142  K 5.4*   < > 4.4   < > 3.7   < > 4.5   < > 4.2 4.0  CL 91*  --   --   --  93*  --  94*  --  91* 94*  CO2 31  --   --   --  34*  --  33*  --  36* 41*  GLUCOSE 390*  --   --   --  146*  --  22*  --  195* 114*  BUN 34*  --   --   --  29*  --  31*  --  27* 27*  CREATININE 1.48*  --   --   --  1.35*  --  1.23*  --  1.30* 1.19*  CALCIUM 8.7*  --   --   --  9.1  --  9.0  --  8.9 9.0  MG 1.5*  --   --   --  1.8  --   --   --  1.8  --   PHOS 5.0*  --   --   --   --   --   --   --   --   --    < > = values in this interval not displayed.   GFR Estimated Creatinine Clearance: 28.8 mL/min (A) (by C-G formula based on SCr of 1.19 mg/dL (H)). Liver Function Tests: Recent Labs  Lab 02/19/20 0142  AST 17  ALT 11  ALKPHOS 54  BILITOT 0.9  PROT 5.5*  ALBUMIN 2.8*   No results for input(s): LIPASE, AMYLASE in the last 168 hours. No results for input(s): AMMONIA in the last 168 hours. Coagulation profile No results for input(s): INR, PROTIME in the last 168 hours. COVID-19 Labs  No results for input(s): DDIMER, FERRITIN, LDH, CRP in the last 72 hours.  Lab Results  Component Value Date   [place] NEGATIVE 02/16/2020    CBC: Recent Labs  Lab 02/18/20 0231 02/18/20 0231 02/19/20 0142 02/19/20 0202 02/20/20 0909 02/21/20 0332 02/22/20 1108  WBC 13.5*  --  11.8*  --  22.6* 13.9* 11.3*  NEUTROABS  --   --   --   --   --   --  8.4*  HGB 11.5*   < > 9.1* 10.5* 10.5* 10.4* 10.1*  HCT 39.1   < > 30.4* 31.0* 34.2* 33.5* 32.5*  MCV 98.2  --  97.1  --  94.0 94.6 94.5  PLT 253  --  191  --  224 206 212   < > = values in this interval not displayed.   Cardiac Enzymes: No results for input(s): CKTOTAL, CKMB, CKMBINDEX, TROPONINI in the last 168 hours. BNP (last 3 results) Recent Labs    02/06/20 1449  PROBNP 985*   CBG: Recent Labs  Lab 02/22/20 1625 02/22/20 2015  02/23/20 0023 02/23/20 0522 02/23/20 0744  GLUCAP 190* 197* 155* 95 93   D-Dimer: No results for input(s): DDIMER in the last 72 hours. Hgb A1c: No results for input(s): HGBA1C in the last 72 hours. Lipid Profile: Recent Labs    02/22/20 0116  TRIG 116   Thyroid function studies: No results for input(s): TSH, T4TOTAL, T3FREE, THYROIDAB in the last 72 hours.  Invalid input(s): FREET3 Anemia work up: No results for input(s): VITAMINB12, FOLATE, FERRITIN, TIBC, IRON, RETICCTPCT in the last 72 hours. Sepsis Labs: Recent Labs  Lab 02/17/20 0102 02/17/20 0450 02/19/20 0142 02/19/20 0419 02/20/20 0909 02/21/20 0332 02/22/20 1108  PROCALCITON <0.10  --   --   --   --   --   --   WBC  --    < > 11.8*  --  22.6* 13.9* 11.3*  LATICACIDVEN  --   --  1.8 2.3*  --   --   --    < > = values in this interval not displayed.   Microbiology Recent Results (from the past 240 hour(s))  SARS Coronavirus 2 by RT PCR (hospital order, performed in [facility] hospital lab) Nasopharyngeal Nasopharyngeal Swab     Status: None   Collection Time: 02/16/20 11:32 PM   Specimen: Nasopharyngeal Swab  Result Value Ref Range Status  SARS Coronavirus 2 NEGATIVE NEGATIVE Final    Comment: (NOTE) SARS-CoV-2 target nucleic acids are NOT DETECTED. The SARS-CoV-2 RNA is generally detectable in upper and lower respiratory specimens during the acute phase of infection. The lowest concentration of SARS-CoV-2 viral copies this assay can detect is 250 copies / mL. A negative result does not preclude SARS-CoV-2 infection and should not be used as the sole basis for treatment or other patient management decisions.  A negative result may occur with improper specimen collection / handling, submission of specimen other than nasopharyngeal swab, presence of viral mutation(s) within the areas targeted by this assay, and inadequate number of viral copies (<250 copies / mL). A negative result must be combined with  clinical observations, patient history, and epidemiological information. Fact Sheet for Patients:   StrictlyIdeas.no Fact Sheet for Healthcare Providers: BankingDealers.co.za This test is not yet approved or cleared  by the [place] FDA and has been authorized for detection and/or diagnosis of SARS-CoV-2 by FDA under an Emergency Use Authorization (EUA).  This EUA will remain in effect (meaning this test can be used) for the duration of the COVID-19 declaration under Section 564(b)(1) of the Act, 21 U.S.C. section 360bbb-3(b)(1), unless the authorization is terminated or revoked sooner. Performed at [facility], [place] [address], [place], [place] [address]   MRSA PCR Screening     Status: None   Collection Time: 02/19/20  1:13 AM   Specimen: Nasopharyngeal  Result Value Ref Range Status   MRSA by PCR NEGATIVE NEGATIVE Final    Comment:        The GeneXpert MRSA Assay (FDA approved for NASAL specimens only), is one component of a comprehensive MRSA colonization surveillance program. It is not intended to diagnose MRSA infection nor to guide or monitor treatment for MRSA infections. Performed at [facility], [place] [address], [place],  [address]      Medications:   . amLODipine  10 mg Oral Daily  . aspirin  81 mg Oral Daily  . atorvastatin  80 mg Oral q1800  . benazepril  10 mg Oral Daily  . budesonide (PULMICORT) nebulizer solution  0.25 mg Nebulization BID  . cholecalciferol  1,000 Units Oral BID  . dextromethorphan-guaiFENesin  1 tablet Oral BID  . docusate sodium  100 mg Oral BID  . enoxaparin (LOVENOX) injection  30 mg Subcutaneous Q24H  . mouth rinse  15 mL Mouth Rinse q12n4p  . polyethylene glycol  17 g Oral BID  . ranolazine  500 mg Oral BID  . sodium chloride flush  3 mL Intravenous Once  . sotalol  40 mg Oral BID   Continuous Infusions: . famotidine (PEPCID) IV 20 mg (02/22/20 1149)  .  fluconazole (DIFLUCAN) IV 100 mg (02/22/20 1234)      LOS: 6 days   Charlynne Cousins  [facility]  02/23/2020, 10:25 AM

## 2020-02-23 NOTE — Progress Notes (Signed)
Physical Therapy Treatment Patient Details Name: [name] MRN: [id number] DOB: [birth year] Today's Date: 02/23/2020    History of Present Illness Pt is an 85 yo female s/p SOB, hypoxia, multifocal PNA and heart failure. Pmhx: brast cancer chemo (current), DMT2, CAD, CKD stage II, Afib.    PT Comments    Pt propped in very upright position in bed and pt reports feeling better sitting up and would be willing to sit up in the recliner. Pt is making good progress towards her goals however is limited in safe mobility by high O2 demand (6L for ambulation) and decreased safety awareness in presence of decreased strength and balance. Pt currently requires minA for bed mobility, transfers and ambulation with RW. Son in room at end of session and both the patient and he are agreeable to SNF. PT will continue to follow acutely.  Follow Up Recommendations  SNF     Equipment Recommendations  Rolling walker with 5" wheels       Precautions / Restrictions Precautions Precautions: Fall;Other (comment) Precaution Comments: watch O2 and HR Restrictions Weight Bearing Restrictions: No    Mobility  Bed Mobility Overal bed mobility: Needs Assistance Bed Mobility: Supine to Sit     Supine to sit: Min assist;HOB elevated     General bed mobility comments: light min A for trunk to upright, heavy use of bed rail and momentum, c/o of dizziness in upright, no appreciable change in BP  Transfers Overall transfer level: Needs assistance Equipment used: Rolling walker (2 wheeled) Transfers: Sit to/from Stand Sit to Stand: Min assist         General transfer comment: min A for power up and steadying, vc for scooting hips forward prior to power up, requires 2 attempts to reach upright, where she braced herself with posterior lean of LE on bed behind her  Ambulation/Gait Ambulation/Gait assistance: Min assist;+2 safety/equipment Gait Distance (Feet): 300 Feet Assistive device: Rolling walker (2  wheeled) Gait Pattern/deviations: Step-through pattern;Decreased stride length;Trunk flexed;Narrow base of support Gait velocity: decreased Gait velocity interpretation: 1.31 - 2.62 ft/sec, indicative of limited community ambulator General Gait Details: min Ax2 for steadying and close chair follow, vc for wider BoS, pt with difficulty looking up and out due to increased kyphosis,        Balance Overall balance assessment: Needs assistance Sitting-balance support: Feet supported;Single extremity supported Sitting balance-Leahy Scale: Fair     Standing balance support: Bilateral upper extremity supported;During functional activity Standing balance-Leahy Scale: Poor Standing balance comment: reliant on external support                            Cognition Arousal/Alertness: Awake/alert Behavior During Therapy: WFL for tasks assessed/performed Overall Cognitive Status: Within Functional Limits for tasks assessed                                           General Comments General comments (skin integrity, edema, etc.): Ambulated on 6L O2 via [place] and able to maintain SaO2 >90%O2, able to maintain SaO2 >90%O2 on 5L O2 with sitting in the recliner, son in room at end of session      Pertinent Vitals/Pain Pain Assessment: No/denies pain           PT Goals (current goals can now be found in the care plan section) Acute Rehab PT Goals  Patient Stated Goal: to go home PT Goal Formulation: With patient Time For Goal Achievement: March 26, 2020 Potential to Achieve Goals: Good Progress towards PT goals: Progressing toward goals    Frequency    Min 2X/week      PT Plan Current plan remains appropriate    Co-evaluation PT/OT/SLP Co-Evaluation/Treatment: Yes Reason for Co-Treatment: For patient/therapist safety;Complexity of the patient's impairments (multi-system involvement)          AM-PAC PT "6 Clicks" Mobility   Outcome Measure  Help needed  turning from your back to your side while in a flat bed without using bedrails?: A Little Help needed moving from lying on your back to sitting on the side of a flat bed without using bedrails?: A Little Help needed moving to and from a bed to a chair (including a wheelchair)?: A Little Help needed standing up from a chair using your arms (e.g., wheelchair or bedside chair)?: A Little Help needed to walk in hospital room?: A Little Help needed climbing 3-5 steps with a railing? : A Lot 6 Click Score: 17    End of Session Equipment Utilized During Treatment: Gait belt;Oxygen Activity Tolerance: Patient tolerated treatment well Patient left: in chair;with call bell/phone within reach Nurse Communication: Mobility status PT Visit Diagnosis: Other abnormalities of gait and mobility (R26.89);Muscle weakness (generalized) (M62.81);Difficulty in walking, not elsewhere classified (R26.2)     Time: [id number] PT Time Calculation (min) (ACUTE ONLY): 56 min  Charges:  $Gait Training: 23-37 mins                     Esbeydi Manago B. Migdalia Dk PT, DPT Acute Rehabilitation Services Pager [phone] Office [phone]    [place] 02/23/2020, 2:17 PM

## 2020-02-23 NOTE — TOC Progression Note (Signed)
Transition of Care [facility]) - Progression Note    Patient Details  Name: [name] MRN: [id number] Date of Birth: 03-Oct-1935  Transition of Care [facility]) CM/SW [place], [place] Phone Number: 02/23/2020, 2:07 PM  Clinical Narrative:     CSW spoke with patient and patients son Louie Casa at bedside to go over SNF choices. Patient and Patients son chose [facility] for SNF placement.  Patient has bed at [facility] when medically ready for discharge.   Expected Discharge Plan: [place] Barriers to Discharge: Continued Medical Work up  Expected Discharge Plan and Services Expected Discharge Plan: [place] arrangements for the past 2 months: Single Family Home                                       Social Determinants of Health (SDOH) Interventions    Readmission Risk Interventions No flowsheet data found.

## 2020-02-23 NOTE — Care Management Important Message (Signed)
Important Message  Patient Details  Name: [name] MRN: [id number] Date of Birth: 02/27/1935   Medicare Important Message Given:  Yes     Shelda Altes 02/23/2020, 11:06 AM

## 2020-02-24 DIAGNOSIS — Y95 Nosocomial condition: Secondary | ICD-10-CM | POA: Diagnosis present

## 2020-02-24 DIAGNOSIS — D508 Other iron deficiency anemias: Secondary | ICD-10-CM | POA: Diagnosis not present

## 2020-02-24 DIAGNOSIS — J9601 Acute respiratory failure with hypoxia: Secondary | ICD-10-CM | POA: Diagnosis not present

## 2020-02-24 DIAGNOSIS — I5031 Acute diastolic (congestive) heart failure: Secondary | ICD-10-CM | POA: Diagnosis not present

## 2020-02-24 DIAGNOSIS — Z20822 Contact with and (suspected) exposure to covid-19: Secondary | ICD-10-CM | POA: Diagnosis present

## 2020-02-24 DIAGNOSIS — D509 Iron deficiency anemia, unspecified: Secondary | ICD-10-CM | POA: Diagnosis not present

## 2020-02-24 DIAGNOSIS — J9622 Acute and chronic respiratory failure with hypercapnia: Secondary | ICD-10-CM | POA: Diagnosis not present

## 2020-02-24 DIAGNOSIS — N1832 Chronic kidney disease, stage 3b: Secondary | ICD-10-CM | POA: Diagnosis present

## 2020-02-24 DIAGNOSIS — R0902 Hypoxemia: Secondary | ICD-10-CM | POA: Diagnosis not present

## 2020-02-24 DIAGNOSIS — B370 Candidal stomatitis: Secondary | ICD-10-CM | POA: Diagnosis not present

## 2020-02-24 DIAGNOSIS — I10 Essential (primary) hypertension: Secondary | ICD-10-CM | POA: Diagnosis not present

## 2020-02-24 DIAGNOSIS — E1159 Type 2 diabetes mellitus with other circulatory complications: Secondary | ICD-10-CM | POA: Diagnosis not present

## 2020-02-24 DIAGNOSIS — I739 Peripheral vascular disease, unspecified: Secondary | ICD-10-CM | POA: Diagnosis not present

## 2020-02-24 DIAGNOSIS — Z933 Colostomy status: Secondary | ICD-10-CM | POA: Diagnosis not present

## 2020-02-24 DIAGNOSIS — G4736 Sleep related hypoventilation in conditions classified elsewhere: Secondary | ICD-10-CM | POA: Diagnosis present

## 2020-02-24 DIAGNOSIS — Z9981 Dependence on supplemental oxygen: Secondary | ICD-10-CM | POA: Diagnosis not present

## 2020-02-24 DIAGNOSIS — N183 Chronic kidney disease, stage 3 unspecified: Secondary | ICD-10-CM | POA: Diagnosis not present

## 2020-02-24 DIAGNOSIS — Z66 Do not resuscitate: Secondary | ICD-10-CM | POA: Diagnosis present

## 2020-02-24 DIAGNOSIS — M6281 Muscle weakness (generalized): Secondary | ICD-10-CM | POA: Diagnosis not present

## 2020-02-24 DIAGNOSIS — I5032 Chronic diastolic (congestive) heart failure: Secondary | ICD-10-CM | POA: Diagnosis not present

## 2020-02-24 DIAGNOSIS — J984 Other disorders of lung: Secondary | ICD-10-CM | POA: Diagnosis present

## 2020-02-24 DIAGNOSIS — G9340 Encephalopathy, unspecified: Secondary | ICD-10-CM | POA: Diagnosis not present

## 2020-02-24 DIAGNOSIS — J069 Acute upper respiratory infection, unspecified: Secondary | ICD-10-CM | POA: Diagnosis not present

## 2020-02-24 DIAGNOSIS — R0602 Shortness of breath: Secondary | ICD-10-CM | POA: Diagnosis not present

## 2020-02-24 DIAGNOSIS — R627 Adult failure to thrive: Secondary | ICD-10-CM | POA: Diagnosis not present

## 2020-02-24 DIAGNOSIS — R41841 Cognitive communication deficit: Secondary | ICD-10-CM | POA: Diagnosis not present

## 2020-02-24 DIAGNOSIS — Z515 Encounter for palliative care: Secondary | ICD-10-CM | POA: Diagnosis present

## 2020-02-24 DIAGNOSIS — R600 Localized edema: Secondary | ICD-10-CM | POA: Diagnosis not present

## 2020-02-24 DIAGNOSIS — I4891 Unspecified atrial fibrillation: Secondary | ICD-10-CM | POA: Diagnosis not present

## 2020-02-24 DIAGNOSIS — G4733 Obstructive sleep apnea (adult) (pediatric): Secondary | ICD-10-CM | POA: Diagnosis present

## 2020-02-24 DIAGNOSIS — I130 Hypertensive heart and chronic kidney disease with heart failure and stage 1 through stage 4 chronic kidney disease, or unspecified chronic kidney disease: Secondary | ICD-10-CM | POA: Diagnosis present

## 2020-02-24 DIAGNOSIS — I2510 Atherosclerotic heart disease of native coronary artery without angina pectoris: Secondary | ICD-10-CM | POA: Diagnosis not present

## 2020-02-24 DIAGNOSIS — E1165 Type 2 diabetes mellitus with hyperglycemia: Secondary | ICD-10-CM | POA: Diagnosis present

## 2020-02-24 DIAGNOSIS — Z789 Other specified health status: Secondary | ICD-10-CM | POA: Diagnosis not present

## 2020-02-24 DIAGNOSIS — R64 Cachexia: Secondary | ICD-10-CM | POA: Diagnosis present

## 2020-02-24 DIAGNOSIS — E559 Vitamin D deficiency, unspecified: Secondary | ICD-10-CM | POA: Diagnosis present

## 2020-02-24 DIAGNOSIS — M2550 Pain in unspecified joint: Secondary | ICD-10-CM | POA: Diagnosis not present

## 2020-02-24 DIAGNOSIS — J9621 Acute and chronic respiratory failure with hypoxia: Secondary | ICD-10-CM | POA: Diagnosis not present

## 2020-02-24 DIAGNOSIS — E7800 Pure hypercholesterolemia, unspecified: Secondary | ICD-10-CM | POA: Diagnosis present

## 2020-02-24 DIAGNOSIS — E1151 Type 2 diabetes mellitus with diabetic peripheral angiopathy without gangrene: Secondary | ICD-10-CM | POA: Diagnosis present

## 2020-02-24 DIAGNOSIS — Z7401 Bed confinement status: Secondary | ICD-10-CM | POA: Diagnosis not present

## 2020-02-24 DIAGNOSIS — E1122 Type 2 diabetes mellitus with diabetic chronic kidney disease: Secondary | ICD-10-CM | POA: Diagnosis present

## 2020-02-24 DIAGNOSIS — L8990 Pressure ulcer of unspecified site, unspecified stage: Secondary | ICD-10-CM | POA: Diagnosis not present

## 2020-02-24 DIAGNOSIS — E785 Hyperlipidemia, unspecified: Secondary | ICD-10-CM | POA: Diagnosis not present

## 2020-02-24 DIAGNOSIS — J45901 Unspecified asthma with (acute) exacerbation: Secondary | ICD-10-CM | POA: Diagnosis present

## 2020-02-24 DIAGNOSIS — I5043 Acute on chronic combined systolic (congestive) and diastolic (congestive) heart failure: Secondary | ICD-10-CM | POA: Diagnosis present

## 2020-02-24 DIAGNOSIS — E1169 Type 2 diabetes mellitus with other specified complication: Secondary | ICD-10-CM | POA: Diagnosis not present

## 2020-02-24 DIAGNOSIS — I480 Paroxysmal atrial fibrillation: Secondary | ICD-10-CM | POA: Diagnosis present

## 2020-02-24 DIAGNOSIS — J189 Pneumonia, unspecified organism: Secondary | ICD-10-CM | POA: Diagnosis not present

## 2020-02-24 DIAGNOSIS — R5381 Other malaise: Secondary | ICD-10-CM | POA: Diagnosis not present

## 2020-02-24 DIAGNOSIS — M810 Age-related osteoporosis without current pathological fracture: Secondary | ICD-10-CM | POA: Diagnosis not present

## 2020-02-24 DIAGNOSIS — I129 Hypertensive chronic kidney disease with stage 1 through stage 4 chronic kidney disease, or unspecified chronic kidney disease: Secondary | ICD-10-CM | POA: Diagnosis not present

## 2020-02-24 DIAGNOSIS — I5033 Acute on chronic diastolic (congestive) heart failure: Secondary | ICD-10-CM | POA: Diagnosis not present

## 2020-02-24 DIAGNOSIS — E440 Moderate protein-calorie malnutrition: Secondary | ICD-10-CM | POA: Diagnosis present

## 2020-02-24 LAB — BASIC METABOLIC PANEL
Anion gap: 7 (ref 5–15)
BUN: 34 mg/dL — ABNORMAL HIGH (ref 8–23)
CO2: 39 mmol/L — ABNORMAL HIGH (ref 22–32)
Calcium: 9.4 mg/dL (ref 8.9–10.3)
Chloride: 95 mmol/L — ABNORMAL LOW (ref 98–111)
Creatinine, Ser: 1.36 mg/dL — ABNORMAL HIGH (ref 0.44–1.00)
GFR calc Af Amer: 42 mL/min — ABNORMAL LOW (ref >60)
GFR calc non Af Amer: 36 mL/min — ABNORMAL LOW (ref >60)
Glucose, Bld: 87 mg/dL (ref 70–99)
Potassium: 4.3 mmol/L (ref 3.5–5.1)
Sodium: 141 mmol/L (ref 135–145)

## 2020-02-24 LAB — GLUCOSE, CAPILLARY
Glucose-Capillary: 64 mg/dL — ABNORMAL LOW (ref 70–99)
Glucose-Capillary: 150 mg/dL — ABNORMAL HIGH (ref 70–99)
Glucose-Capillary: 102 mg/dL — ABNORMAL HIGH (ref 70–99)
Glucose-Capillary: 129 mg/dL — ABNORMAL HIGH (ref 70–99)

## 2020-02-24 MED ORDER — DM-GUAIFENESIN ER 30-600 MG PO TB12: 1.0000 | ORAL_TABLET | Freq: Two times a day (BID) | ORAL | Status: AC

## 2020-02-24 MED ORDER — FLUCONAZOLE 100 MG PO TABS
100.0000 mg | ORAL_TABLET | Freq: Every day | ORAL | 0 refills | Status: AC
Start: 2020-02-24 — End: 2020-02-26

## 2020-02-24 MED ORDER — DEXTROSE 50 % IV SOLN
INTRAVENOUS | Status: AC
  Administered 2020-02-24: 12.5 g via INTRAVENOUS
  Filled 2020-02-24: qty 50

## 2020-02-24 NOTE — TOC Transition Note (Signed)
Transition of Care [facility]) - CM/SW Discharge Note   Patient Details  Name: [name] MRN: [id number] Date of Birth: December 21, 1934  Transition of Care [facility] CM/SW Contact:  Bary Castilla, LCSW Phone Number: [phone] 02/24/2020, 10:09 AM   Clinical Narrative:    Patient will DC to:?[facility] Anticipated DC date:?02/24/20 Family notified:Randy? Transport [id number]   Per MD patient ready for DC to [facility]?. RN, patient, patient's family, and facility notified of DC. Discharge Summary sent to facility. RN given number for report [phone] room 321 ask for the [facility]. DC packet on chart. Ambulance transport requested for patient.   CSW signing off.    [place], [place] [phone]    Final next level of care: Skilled Nursing Facility Barriers to Discharge: No Barriers Identified   Patient Goals and CMS Choice Patient states their goals for this hospitalization and ongoing recovery are:: to be walking at discharge and return home CMS Medicare.gov Compare Post Acute Care list provided to:: Patient Choice offered to / list presented to : Patient  Discharge Placement              Patient chooses bed at: [facility] Patient to be transferred to facility by: [place] Name of family member notified: Louie Casa Patient and family notified of of transfer: 02/24/20  Discharge Plan and Services                                     Social Determinants of Health (SDOH) Interventions     Readmission Risk Interventions No flowsheet data found.

## 2020-02-24 NOTE — Discharge Summary (Signed)
Physician Discharge Summary  [name] [id number] DOB: September 03, 1935 DOA: 02/16/2020  PCP: Tonia Ghent, MD  Admit date: 02/16/2020 Discharge date: 02/24/2020  Admitted From: Home Disposition:  SNF  Recommendations for Outpatient Follow-up:  1. Follow up with PCP in 1-2 weeks   Home Health:No Equipment/Devices:SNF with Oxygen  Discharge Condition:Stable CODE STATUS:Full Diet recommendation: Heart Healthy  Brief/Interim Summary: 85 y.o. female past medical history of chronic atrial fibrillation, not on anticoagulation due to spontaneous retroperitoneal bleed, chronic diastolic heart failure with the most recent echo in 2017, chronic respiratory failure on 2 L of oxygen, restrictive lung disease from kyphosis, history of breast cancer diabetes mellitus type 2 and chronic kidney disease stage II presents to the ED with shortness of breath that started 10 days prior to admission progressively getting worst, saw her cardiologist on 02/06/2020 they felt her bilateral lower extremity and was minimal.  Was started empirically and treated for possible multifocal pneumonia on 01/17/2020 intubated on 02/19/2020 and extubated on 02/20/2020  Discharge Diagnoses:  Principal Problem:   Acute diastolic CHF (congestive heart failure) ([place]) Active Problems:   Type 2 diabetes mellitus with vascular disease ([place])   Essential hypertension   Chronic kidney disease, stage III (moderate)   Chronic diastolic heart failure (HCC)   Chronic respiratory failure with hypoxia and hypercapnia (HCC)   Oral thrush   Acute on chronic respiratory failure with hypoxia (HCC)  Acute on chronic respiratory failure with hypoxia secondarily to community-acquired pneumonia acute decompensated heart failure: She was noted on IV empiric antibiotics on admission cultures remain negative, she complete her course of IV antibiotics in house she was intubated on admission due to decompensation, she self extubated herself on  02/20/2020. She was also diuresed and she is negative about 2 L she will go to skilled nursing facility off [facility]. Physical therapy evaluated the patient recommended skilled nursing facility.  Acute encephalopathy: Likely due to hypercarbia and hypoxia now resolved.  CT of the head was negative.  Mild leukocytosis: Likely due to infectious.  Acute kidney injury on chronic kidney disease stage III: With a baseline creatinine of less than 1 repeat that for improved with diuresis.  Diabetes mellitus type 2: No changes made to her medication.  Central hypertension: No change made to his medication.  Oral thrush: She was started on IV Diflucan to continue with as an outpatient for 7-day course.   Discharge Instructions  Discharge Instructions    Diet - low sodium heart healthy   Complete by: As directed    Increase activity slowly   Complete by: As directed      Allergies as of 02/24/2020      Reactions   Actos [pioglitazone] Other (See Comments)   edema   Metformin And Related Other (See Comments)   Held due to renal function   Zocor [simvastatin] Other (See Comments)   Myalgias      Medication List    TAKE these medications   albuterol 108 (90 Base) MCG/ACT inhaler Commonly known as: VENTOLIN HFA Inhale 2 puffs into the lungs every 4 (four) hours as needed for wheezing or shortness of breath.   amLODipine 10 MG tablet Commonly known as: NORVASC TAKE ONE TABLET (10 MG) BY MOUTH DAILY. What changed: See the new instructions.   aspirin EC 81 MG tablet Take 81 mg by mouth at bedtime.   atorvastatin 80 MG tablet Commonly known as: LIPITOR Take 1 tablet (80 mg total) by mouth daily at 6 PM.  BD Insulin Syringe U/F 31G X 5/16" 0.3 ML Misc Generic drug: Insulin Syringe-Needle U-100 USE DAILY AND AS DIRECTED SLIDING SCALE. MULTIPLE INJECTIONS OF INSULIN PER DAY What changed: See the new instructions.   benazepril 20 MG tablet Commonly known as: LOTENSIN Take 1  tablet (20 mg total) by mouth daily. What changed: how much to take   dextromethorphan-guaiFENesin 30-600 MG 12hr tablet Commonly known as: MUCINEX DM Take 1 tablet by mouth 2 (two) times daily for 2 days.   Flovent HFA 44 MCG/ACT inhaler Generic drug: fluticasone TAKE 2 PUFFS BY MOUTH TWICE A DAY What changed: See the new instructions.   fluconazole 100 MG tablet Commonly known as: Diflucan Take 1 tablet (100 mg total) by mouth daily for 2 days.   fluticasone 50 MCG/ACT nasal spray Commonly known as: FLONASE Place 1 spray into both nostrils daily.   glyBURIDE 5 MG tablet Commonly known as: DIABETA TAKE 2 TABLETS BY MOUTH DAILY WITH BREAKFAST. What changed: See the new instructions.   insulin aspart 100 UNIT/ML injection Commonly known as: NovoLOG USE PER SLIDING SCALE--200-250=4-5 UNITS, 251-300=5-6 UNITS,301-350=6-7 UNITS, WITH MEALS What changed:   how much to take  how to take this  when to take this   meclizine 25 MG tablet Commonly known as: ANTIVERT Take 1 tablet (25 mg total) by mouth daily as needed. For dizziness   nitroGLYCERIN 0.4 MG SL tablet Commonly known as: NITROSTAT Place 1 tablet (0.4 mg total) under the tongue as needed for chest pain. As directed   NON FORMULARY 2 L by Continuous infusion (non-IV) route continuous.   OneTouch Ultra test strip Generic drug: glucose blood USE TO CHECK BLOOD SUGAR 4 TIMES A DAY. INSULIN-DEPENDENT. DIAGNOSIS: E11.9 What changed: See the new instructions.   ranolazine 500 MG 12 hr tablet Commonly known as: RANEXA Take 1 tablet (500 mg total) by mouth 2 (two) times daily. TAKE 1 TABLET BY MOUTH TWICE A DAY What changed: additional instructions   REFRESH OP Place 1 drop into both eyes daily as needed (for dry eyes).   sotalol 80 MG tablet Commonly known as: BETAPACE TAKE 0.5 TABLETS (40 MG TOTAL) BY MOUTH 2 (TWO) TIMES DAILY.   Vitamin D 1000 units capsule Take 1,000 Units by mouth 2 (two) times daily.       Contact information for after-discharge care    Destination    [facility] .   Service: Skilled Nursing Contact information: [id number] [place] [address] [phone]             Allergies  Allergen Reactions  . Actos [Pioglitazone] Other (See Comments)    edema  . Metformin And Related Other (See Comments)    Held due to renal function  . Zocor [Simvastatin] Other (See Comments)    Myalgias    Consultations:  Pulmonary and critical care   Procedures/Studies: DG Chest 2 View  Result Date: 02/16/2020 CLINICAL DATA:  Shortness of breath EXAM: CHEST - 2 VIEW COMPARISON:  02/06/2020 FINDINGS: Cardiomegaly. Prior CABG. Patchy bilateral airspace opacities have progressed since prior study. Possible small effusions. No acute bony abnormality. IMPRESSION: Patchy bilateral airspace disease, worsening since prior study could reflect edema or infection. Small bilateral effusions. Electronically Signed   By: Rolm Baptise M.D.   On: 02/16/2020 19:37   DG Chest 2 View  Result Date: 02/07/2020 CLINICAL DATA:  Shortness of breath and fatigue for 3 weeks. Atrial fibrillation. Coronary artery disease. EXAM: CHEST - 2  VIEW COMPARISON:  01/19/2017 FINDINGS: Heart size remains within normal limits. Prior CABG again noted. Aortic atherosclerosis incidentally noted. Pleural-parenchymal scarring is again seen in both lower lungs, however there is no evidence of acute infiltrate or pleural effusion. IMPRESSION: Stable bilateral pleural-parenchymal scarring. No active cardiopulmonary disease. Electronically Signed   By: Marlaine Hind M.D.   On: 02/07/2020 08:53   DG Abd 1 View  Result Date: 02/22/2020 CLINICAL DATA:  Abdominal distension EXAM: ABDOMEN - 1 VIEW COMPARISON:  02/19/2019 FINDINGS: Scattered large and small bowel gas is noted. No abnormal mass is seen. Calcifications are noted in the right mid abdomen consistent with cholelithiasis.  Heavy vascular calcifications are noted. Left common iliac artery stent is seen. IMPRESSION: Cholelithiasis. No acute abnormality noted. Electronically Signed   By: Inez Catalina M.D.   On: 02/22/2020 14:29   CT HEAD WO CONTRAST  Result Date: 02/19/2020 CLINICAL DATA:  Altered mental status. EXAM: CT HEAD WITHOUT CONTRAST TECHNIQUE: Contiguous axial images were obtained from the base of the skull through the vertex without intravenous contrast. COMPARISON:  July 14, 2016. FINDINGS: Brain: Mild chronic ischemic white matter disease is noted. No mass effect or midline shift is noted. Ventricular size is within normal limits. There is no evidence of mass lesion, hemorrhage or acute infarction. Vascular: No hyperdense vessel or unexpected calcification. Skull: Normal. Negative for fracture or focal lesion. Sinuses/Orbits: No acute finding. Other: None. IMPRESSION: Mild chronic ischemic white matter disease. No acute intracranial abnormality seen. Electronically Signed   By: Marijo Conception M.D.   On: 02/19/2020 14:53   DG CHEST PORT 1 VIEW  Result Date: 02/19/2020 CLINICAL DATA:  Hypoxemia EXAM: PORTABLE CHEST 1 VIEW COMPARISON:  02/16/2020 FINDINGS: Interval intubation, tip of the endotracheal tube is about 1.7 cm superior to the carina. Esophageal tube tip overlies the proximal stomach. Post sternotomy changes. Small bilateral pleural effusions. Increased bilateral interstitial and ground-glass opacities with patchy lung consolidations. Enlarged cardiomediastinal silhouette with aortic atherosclerosis. No pneumothorax. Multiple old bilateral rib deformities. IMPRESSION: 1. Endotracheal tube tip about 1.7 cm superior to carina. 2. Small bilateral effusions with interval worsening of diffuse bilateral interstitial and ground-glass opacity with patchy lung consolidations 3. Cardiomegaly Electronically Signed   By: Donavan Foil M.D.   On: 02/19/2020 01:39   DG Abd Portable 1V  Result Date:  02/19/2020 CLINICAL DATA:  NG tube placement EXAM: PORTABLE ABDOMEN - 1 VIEW COMPARISON:  03/04/2011, 02/16/2020 FINDINGS: Bibasilar pleural effusions and airspace disease. Lower sternotomy changes. Esophageal tube tip and side-port project over the proximal stomach. Faint calcifications in the right upper quadrant likely reflecting gallstones. Left iliac stent. Upper gas pattern nonobstructed with moderate stool. Questionable appearance of intramural air within left upper quadrant bowel loops. IMPRESSION: 1. Esophageal tube tip overlies the proximal stomach 2. Questionable appearance of intramural air involving left upper quadrant small bowel loops, radiographic follow-up is suggested. Electronically Signed   By: Donavan Foil M.D.   On: 02/19/2020 01:37   ECHOCARDIOGRAM COMPLETE  Result Date: 02/17/2020    ECHOCARDIOGRAM REPORT   Patient Name:   [name] Date of Exam: 02/17/2020 Medical Rec #:  [id number]    Height:       62.0 in Accession #:    [id number]   Weight:       118.8 lb Date of Birth:  11/19/1934   BSA:          1.532 m Patient Age:    24 years     BP:  137/58 mmHg Patient Gender: F            HR:           78 bpm. Exam Location:  Inpatient Procedure: 2D Echo, Cardiac Doppler, Color Doppler and Intracardiac            Opacification Agent Indications:    CHF-Acute Diastolic 0000000  History:        Patient has prior history of Echocardiogram examinations, most                 recent 01/31/2016. CHF, Arrythmias:Atrial Fibrillation; Risk                 Factors:Diabetes, Hypertension and Dyslipidemia. CKD.  Sonographer:    Clayton Lefort RDCS (AE) Referring Phys: [place]  1. Left ventricular ejection fraction, by estimation, is 60 to 65%. The left ventricle has normal function. The left ventricle has no regional wall motion abnormalities. There is moderate left ventricular hypertrophy. Left ventricular diastolic parameters are consistent with Grade II diastolic  dysfunction (pseudonormalization). There is the interventricular septum is flattened in systole and diastole, consistent with right ventricular pressure and volume overload.  2. Right ventricular systolic function is mildly reduced. The right ventricular size is mildly enlarged. There is severely elevated pulmonary artery systolic pressure. The estimated right ventricular systolic pressure is [id number] mmHg.  3. Left atrial size was moderately dilated.  4. The mitral valve is grossly normal. Mild mitral valve regurgitation.  5. Tricuspid valve regurgitation is mild to moderate.  6. The aortic valve is tricuspid. Aortic valve regurgitation is not visualized. Mild aortic valve sclerosis is present, with no evidence of aortic valve stenosis.  7. The inferior vena cava is normal in size with <50% respiratory variability, suggesting right atrial pressure of 8 mmHg. FINDINGS  Left Ventricle: Left ventricular ejection fraction, by estimation, is 60 to 65%. The left ventricle has normal function. The left ventricle has no regional wall motion abnormalities. Definity contrast agent was given IV to delineate the left ventricular  endocardial borders. The left ventricular internal cavity size was normal in size. There is moderate left ventricular hypertrophy. The interventricular septum is flattened in systole and diastole, consistent with right ventricular pressure and volume overload. Left ventricular diastolic parameters are consistent with Grade II diastolic dysfunction (pseudonormalization). Right Ventricle: The right ventricular size is mildly enlarged. No increase in right ventricular wall thickness. Right ventricular systolic function is mildly reduced. There is severely elevated pulmonary artery systolic pressure. The tricuspid regurgitant velocity is 3.62 m/s, and with an assumed right atrial pressure of 8 mmHg, the estimated right ventricular systolic pressure is [id number] mmHg. Left Atrium: Left atrial size was moderately  dilated. Right Atrium: Right atrial size was normal in size. Pericardium: There is no evidence of pericardial effusion. Mitral Valve: The mitral valve is grossly normal. There is mild thickening of the mitral valve leaflet(s). Mild mitral valve regurgitation. MV peak gradient, 13.5 mmHg. The mean mitral valve gradient is 3.0 mmHg. Tricuspid Valve: The tricuspid valve is grossly normal. Tricuspid valve regurgitation is mild to moderate. Aortic Valve: The aortic valve is tricuspid. Aortic valve regurgitation is not visualized. Mild aortic valve sclerosis is present, with no evidence of aortic valve stenosis. Mild aortic valve annular calcification. Aortic valve mean gradient measures 3.0  mmHg. Aortic valve peak gradient measures 6.6 mmHg. Aortic valve area, by VTI measures 2.41 cm. Pulmonic Valve: The pulmonic valve was grossly normal. Pulmonic valve regurgitation is not visualized.  Aorta: The aortic root is normal in size and structure. Venous: The inferior vena cava is normal in size with less than 50% respiratory variability, suggesting right atrial pressure of 8 mmHg. IAS/Shunts: No atrial level shunt detected by color flow Doppler.  LEFT VENTRICLE PLAX 2D LVIDd:         2.60 cm LVIDs:         1.70 cm LV PW:         1.60 cm LV IVS:        1.50 cm LVOT diam:     2.00 cm LV SV:         78 LV SV Index:   51 LVOT Area:     3.14 cm  RIGHT VENTRICLE             IVC RV Basal diam:  2.80 cm     IVC diam: 1.80 cm RV S prime:     10.60 cm/s TAPSE (M-mode): 1.5 cm LEFT ATRIUM             Index       RIGHT ATRIUM           Index LA diam:        3.20 cm 2.09 cm/m  RA Area:     15.60 cm LA Vol (A2C):   58.5 ml 38.18 ml/m RA Volume:   37.30 ml  24.35 ml/m LA Vol (A4C):   74.4 ml 48.56 ml/m LA Biplane Vol: 66.4 ml 43.34 ml/m  AORTIC VALVE AV Area (Vmax):    2.60 cm AV Area (Vmean):   2.61 cm AV Area (VTI):     2.41 cm AV Vmax:           128.00 cm/s AV Vmean:          85.900 cm/s AV VTI:            0.323 m AV Peak  Grad:      6.6 mmHg AV Mean Grad:      3.0 mmHg LVOT Vmax:         106.00 cm/s LVOT Vmean:        71.500 cm/s LVOT VTI:          0.248 m LVOT/AV VTI ratio: 0.77  AORTA Ao Root diam: 3.20 cm MITRAL VALVE             TRICUSPID VALVE MV Area (PHT): 3.93 cm  TR Peak grad:   52.4 mmHg MV Peak grad:  13.5 mmHg TR Vmax:        362.00 cm/s MV Mean grad:  3.0 mmHg MV Vmax:       1.84 m/s  SHUNTS MV Vmean:      70.7 cm/s Systemic VTI:  0.25 m                          Systemic Diam: 2.00 cm Rozann Lesches MD Electronically signed by Rozann Lesches MD Signature Date/Time: 02/17/2020/12:27:17 PM    Final     (Echo, Carotid, EGD, Colonoscopy, ERCP)    Subjective: No complaints feels great.  Discharge Exam: Vitals:   02/24/20 0731 02/24/20 0916  BP: (!) 115/54   Pulse: (!) 59   Resp: 17   Temp: 98.2 F (36.8 C)   SpO2: 96% 94%   Vitals:   02/24/20 0042 02/24/20 0500 02/24/20 0731 02/24/20 0916  BP: (!) 100/56 (!) 110/56 (!) 115/54   Pulse: (!) 55 (!) 57 Marland Kitchen)  59   Resp: 18 18 17    Temp: 97.6 F (36.4 C) 97.8 F (36.6 C) 98.2 F (36.8 C)   TempSrc: Axillary Axillary Oral   SpO2:  98% 96% 94%  Weight:  50.2 kg    Height:        General: Pt is alert, awake, not in acute distress Cardiovascular: RRR, S1/S2 +, no rubs, no gallops Respiratory: CTA bilaterally, no wheezing, no rhonchi Abdominal: Soft, NT, ND, bowel sounds + Extremities: no edema, no cyanosis    The results of significant diagnostics from this hospitalization (including imaging, microbiology, ancillary and laboratory) are listed below for reference.     Microbiology: Recent Results (from the past 240 hour(s))  SARS Coronavirus 2 by RT PCR (hospital order, performed in [facility] hospital lab) Nasopharyngeal Nasopharyngeal Swab     Status: None   Collection Time: 02/16/20 11:32 PM   Specimen: Nasopharyngeal Swab  Result Value Ref Range Status   SARS Coronavirus 2 NEGATIVE NEGATIVE Final    Comment: (NOTE) SARS-CoV-2  target nucleic acids are NOT DETECTED. The SARS-CoV-2 RNA is generally detectable in upper and lower respiratory specimens during the acute phase of infection. The lowest concentration of SARS-CoV-2 viral copies this assay can detect is 250 copies / mL. A negative result does not preclude SARS-CoV-2 infection and should not be used as the sole basis for treatment or other patient management decisions.  A negative result may occur with improper specimen collection / handling, submission of specimen other than nasopharyngeal swab, presence of viral mutation(s) within the areas targeted by this assay, and inadequate number of viral copies (<250 copies / mL). A negative result must be combined with clinical observations, patient history, and epidemiological information. Fact Sheet for Patients:   StrictlyIdeas.no Fact Sheet for Healthcare Providers: BankingDealers.co.za This test is not yet approved or cleared  by the [place] FDA and has been authorized for detection and/or diagnosis of SARS-CoV-2 by FDA under an Emergency Use Authorization (EUA).  This EUA will remain in effect (meaning this test can be used) for the duration of the COVID-19 declaration under Section 564(b)(1) of the Act, 21 U.S.C. section 360bbb-3(b)(1), unless the authorization is terminated or revoked sooner. Performed at [facility], [place] [address], [place], [place] [address]   MRSA PCR Screening     Status: None   Collection Time: 02/19/20  1:13 AM   Specimen: Nasopharyngeal  Result Value Ref Range Status   MRSA by PCR NEGATIVE NEGATIVE Final    Comment:        The GeneXpert MRSA Assay (FDA approved for NASAL specimens only), is one component of a comprehensive MRSA colonization surveillance program. It is not intended to diagnose MRSA infection nor to guide or monitor treatment for MRSA infections. Performed at [facility], [place] [address] [place], [place] [address]   SARS Coronavirus 2 by RT PCR (hospital order, performed in [facility] hospital lab) Nasopharyngeal Nasopharyngeal Swab     Status: None   Collection Time: 02/23/20  4:39 PM   Specimen: Nasopharyngeal Swab  Result Value Ref Range Status   SARS Coronavirus 2 NEGATIVE NEGATIVE Final    Comment: (NOTE) SARS-CoV-2 target nucleic acids are NOT DETECTED. The SARS-CoV-2 RNA is generally detectable in upper and lower respiratory specimens during the acute phase of infection. The lowest concentration of SARS-CoV-2 viral copies this assay can detect is 250 copies / mL. A negative result does not preclude SARS-CoV-2 infection and should not  be used as the sole basis for treatment or other patient management decisions.  A negative result may occur with improper specimen collection / handling, submission of specimen other than nasopharyngeal swab, presence of viral mutation(s) within the areas targeted by this assay, and inadequate number of viral copies (<250 copies / mL). A negative result must be combined with clinical observations, patient history, and epidemiological information. Fact Sheet for Patients:   StrictlyIdeas.no Fact Sheet for Healthcare Providers: BankingDealers.co.za This test is not yet approved or cleared  by the [place] FDA and has been authorized for detection and/or diagnosis of SARS-CoV-2 by FDA under an Emergency Use Authorization (EUA).  This EUA will remain in effect (meaning this test can be used) for the duration of the COVID-19 declaration under Section 564(b)(1) of the Act, 21 U.S.C. section 360bbb-3(b)(1), unless the authorization is terminated or revoked sooner. Performed at [facility], [place] [address], [place], [place] [address]      Labs: BNP (last 3 results) Recent Labs    02/16/20 1814 02/19/20 0142  BNP 387.2* [id number]*   Basic Metabolic Panel: Recent Labs  Lab  02/19/20 0142 02/19/20 0202 02/20/20 0909 02/21/20 0332 02/22/20 1108 02/23/20 0339 02/24/20 0401  NA 137   < > 139 140 140 142 141  K 5.4*   < > 3.7 4.5 4.2 4.0 4.3  CL 91*   < > 93* 94* 91* 94* 95*  CO2 31   < > 34* 33* 36* 41* 39*  GLUCOSE 390*   < > 146* 22* 195* 114* 87  BUN 34*   < > 29* 31* 27* 27* 34*  CREATININE 1.48*   < > 1.35* 1.23* 1.30* 1.19* 1.36*  CALCIUM 8.7*   < > 9.1 9.0 8.9 9.0 9.4  MG 1.5*  --  1.8  --  1.8  --   --   PHOS 5.0*  --   --   --   --   --   --    < > = values in this interval not displayed.   Liver Function Tests: Recent Labs  Lab 02/19/20 0142  AST 17  ALT 11  ALKPHOS 54  BILITOT 0.9  PROT 5.5*  ALBUMIN 2.8*   No results for input(s): LIPASE, AMYLASE in the last 168 hours. No results for input(s): AMMONIA in the last 168 hours. CBC: Recent Labs  Lab 02/18/20 0231 02/18/20 0231 02/19/20 0142 02/19/20 0202 02/20/20 0909 02/21/20 0332 02/22/20 1108  WBC 13.5*  --  11.8*  --  22.6* 13.9* 11.3*  NEUTROABS  --   --   --   --   --   --  8.4*  HGB 11.5*   < > 9.1* 10.5* 10.5* 10.4* 10.1*  HCT 39.1   < > 30.4* 31.0* 34.2* 33.5* 32.5*  MCV 98.2  --  97.1  --  94.0 94.6 94.5  PLT 253  --  191  --  224 206 212   < > = values in this interval not displayed.   Cardiac Enzymes: No results for input(s): CKTOTAL, CKMB, CKMBINDEX, TROPONINI in the last 168 hours. BNP: Invalid input(s): POCBNP CBG: Recent Labs  Lab 02/23/20 1216 02/23/20 1619 02/23/20 2102 02/24/20 0730 02/24/20 0841  GLUCAP 131* 185* 228* 64* 150*   D-Dimer No results for input(s): DDIMER in the last 72 hours. Hgb A1c No results for input(s): HGBA1C in the last 72 hours. Lipid Profile Recent Labs    02/22/20 0116  TRIG  116   Thyroid function studies No results for input(s): TSH, T4TOTAL, T3FREE, THYROIDAB in the last 72 hours.  Invalid input(s): FREET3 Anemia work up No results for input(s): VITAMINB12, FOLATE, FERRITIN, TIBC, IRON, RETICCTPCT in the  last 72 hours. Urinalysis    Component Value Date/Time   COLORURINE AMBER (A) 11/22/2016 0537   APPEARANCEUR HAZY (A) 11/22/2016 0537   LABSPEC 1.017 11/22/2016 0537   PHURINE 5.0 11/22/2016 0537   GLUCOSEU NEGATIVE 11/22/2016 0537   HGBUR NEGATIVE 11/22/2016 0537   HGBUR small 11/27/2010 1056   BILIRUBINUR NEGATIVE 11/22/2016 0537   KETONESUR NEGATIVE 11/22/2016 0537   PROTEINUR NEGATIVE 11/22/2016 0537   UROBILINOGEN 1.0 10/25/2014 1754   NITRITE NEGATIVE 11/22/2016 0537   LEUKOCYTESUR NEGATIVE 11/22/2016 0537   Sepsis Labs Invalid input(s): PROCALCITONIN,  WBC,  LACTICIDVEN Microbiology Recent Results (from the past 240 hour(s))  SARS Coronavirus 2 by RT PCR (hospital order, performed in [facility] hospital lab) Nasopharyngeal Nasopharyngeal Swab     Status: None   Collection Time: 02/16/20 11:32 PM   Specimen: Nasopharyngeal Swab  Result Value Ref Range Status   SARS Coronavirus 2 NEGATIVE NEGATIVE Final    Comment: (NOTE) SARS-CoV-2 target nucleic acids are NOT DETECTED. The SARS-CoV-2 RNA is generally detectable in upper and lower respiratory specimens during the acute phase of infection. The lowest concentration of SARS-CoV-2 viral copies this assay can detect is 250 copies / mL. A negative result does not preclude SARS-CoV-2 infection and should not be used as the sole basis for treatment or other patient management decisions.  A negative result may occur with improper specimen collection / handling, submission of specimen other than nasopharyngeal swab, presence of viral mutation(s) within the areas targeted by this assay, and inadequate number of viral copies (<250 copies / mL). A negative result must be combined with clinical observations, patient history, and epidemiological information. Fact Sheet for Patients:   StrictlyIdeas.no Fact Sheet for Healthcare Providers: BankingDealers.co.za This test is not yet  approved or cleared  by the [place] FDA and has been authorized for detection and/or diagnosis of SARS-CoV-2 by FDA under an Emergency Use Authorization (EUA).  This EUA will remain in effect (meaning this test can be used) for the duration of the COVID-19 declaration under Section 564(b)(1) of the Act, 21 U.S.C. section 360bbb-3(b)(1), unless the authorization is terminated or revoked sooner. Performed at [facility], [place] [address], [place], [place] [address]   MRSA PCR Screening     Status: None   Collection Time: 02/19/20  1:13 AM   Specimen: Nasopharyngeal  Result Value Ref Range Status   MRSA by PCR NEGATIVE NEGATIVE Final    Comment:        The GeneXpert MRSA Assay (FDA approved for NASAL specimens only), is one component of a comprehensive MRSA colonization surveillance program. It is not intended to diagnose MRSA infection nor to guide or monitor treatment for MRSA infections. Performed at [facility], [place] [address] [place], [place] [address]   SARS Coronavirus 2 by RT PCR (hospital order, performed in [facility] hospital lab) Nasopharyngeal Nasopharyngeal Swab     Status: None   Collection Time: 02/23/20  4:39 PM   Specimen: Nasopharyngeal Swab  Result Value Ref Range Status   SARS Coronavirus 2 NEGATIVE NEGATIVE Final    Comment: (NOTE) SARS-CoV-2 target nucleic acids are NOT DETECTED. The SARS-CoV-2 RNA is generally detectable in upper and lower respiratory specimens during the acute phase of infection. The  lowest concentration of SARS-CoV-2 viral copies this assay can detect is 250 copies / mL. A negative result does not preclude SARS-CoV-2 infection and should not be used as the sole basis for treatment or other patient management decisions.  A negative result may occur with improper specimen collection / handling, submission of specimen other than nasopharyngeal swab, presence of viral mutation(s) within the areas targeted by this assay,  and inadequate number of viral copies (<250 copies / mL). A negative result must be combined with clinical observations, patient history, and epidemiological information. Fact Sheet for Patients:   StrictlyIdeas.no Fact Sheet for Healthcare Providers: BankingDealers.co.za This test is not yet approved or cleared  by the [place] FDA and has been authorized for detection and/or diagnosis of SARS-CoV-2 by FDA under an Emergency Use Authorization (EUA).  This EUA will remain in effect (meaning this test can be used) for the duration of the COVID-19 declaration under Section 564(b)(1) of the Act, 21 U.S.C. section 360bbb-3(b)(1), unless the authorization is terminated or revoked sooner. Performed at [facility], [place] [address], [place],  [address]      Time coordinating discharge: Over 30 minutes  SIGNED:   Charlynne Cousins, MD  [facility] 02/24/2020, 9:49 AM Pager   If 7PM-7AM, please contact night-coverage [email or web address] Password [email or web address]

## 2020-02-25 ENCOUNTER — Encounter (HOSPITAL_BASED_OUTPATIENT_CLINIC_OR_DEPARTMENT_OTHER): Payer: Medicare Other | Admitting: Pulmonary Disease

## 2020-02-26 ENCOUNTER — Non-Acute Institutional Stay (SKILLED_NURSING_FACILITY): Payer: Medicare Other | Admitting: Internal Medicine

## 2020-02-26 ENCOUNTER — Encounter: Payer: Self-pay | Admitting: Internal Medicine

## 2020-02-26 DIAGNOSIS — J9621 Acute and chronic respiratory failure with hypoxia: Secondary | ICD-10-CM | POA: Diagnosis not present

## 2020-02-26 DIAGNOSIS — I5031 Acute diastolic (congestive) heart failure: Secondary | ICD-10-CM | POA: Diagnosis not present

## 2020-02-26 DIAGNOSIS — R627 Adult failure to thrive: Secondary | ICD-10-CM

## 2020-02-26 DIAGNOSIS — B370 Candidal stomatitis: Secondary | ICD-10-CM

## 2020-02-26 DIAGNOSIS — E1159 Type 2 diabetes mellitus with other circulatory complications: Secondary | ICD-10-CM

## 2020-02-26 DIAGNOSIS — G9340 Encephalopathy, unspecified: Secondary | ICD-10-CM

## 2020-02-26 NOTE — Patient Instructions (Signed)
See assessment and plan under each diagnosis in the problem list and acutely for this visit 

## 2020-02-26 NOTE — Assessment & Plan Note (Addendum)
Problematic is an oral sulfonylurea as well as intensive sliding scale insulin.  Once the pattern of glucose control is determined at the SNF; it is likely that the glyburide will be discontinued because of the risk of hypoglycemia in the context of concomitant insulin therapy.

## 2020-02-26 NOTE — Assessment & Plan Note (Signed)
Clinically not encephalopathic based on today's exam.

## 2020-02-26 NOTE — Progress Notes (Signed)
NURSING HOME LOCATION:  [facility] ROOM NUMBER:    CODE STATUS: DNR  PCP: [name], MD   This is a comprehensive admission note to [place] performed on this date less than 30 days from date of admission. Included are preadmission medical/surgical history; reconciled medication list; family history; social history and comprehensive review of systems.  Corrections and additions to the records were documented. Comprehensive physical exam was also performed. Additionally a clinical summary was entered for each active diagnosis pertinent to this admission in the Problem List to enhance continuity of care.  HPI: Patient was hospitalized 5/14-5/22/2021 admitted from home with shortness of breath progressive over 10 days.  This was in the context of history of chronic A. fib, chronic diastolic heart failure, and chronic respiratory failure with oxygen dependence, and restrictive lung disease. Empiric antibiotics were initiated for possible community-acquired pneumonia with acute on chronic respiratory failure with hypoxia and hypercapnia and  mild leukocytosis. Aggressive diuresis was initiated for acute diastolic congestive heart failure superimposed on chronic diastolic heart failure  with resultant negative I/O of 2 L.   Intubation was performed 5/17 for progressive decompensation; she self extubated 5/18. Course was complicated by acute encephalopathy attributed to the hypoxia and hypercarbia.  CT of the head revealed no acute changes. She also developed AKI superimposed on CKD stage III. Course was complicated by oral thrush for which she received Diflucan.  This was to be continued for 48 hours after admission to the SNF. PT recommended SNF placement for debilitation.  She was discharged to the SNF without Lasix.  Past medical and surgical history: Includes essential hypertension, insulin-dependent diabetes with CKD, PVD, osteoporosis, history of breast cancer, history  of diverticulitis, history of atrial fibrillation, and history of iron deficiency anemia.  She has had breast surgery for the cancer.  She is also had multiple cardiac catheterizations.  CABG was performed in 2003.  Additionally she has had history of partial colectomy.  Social history: Nondrinker, never smoked.  Family history: Extensive history reviewed.  History is noncontributory due to age.   Review of systems: She offers no symptoms and initially stated that she was "all right".  She indicated that her breathing is essentially stable.  She describes an occasional cough with thick secretions which are difficult to expectorate.  She does state that she snores but does not use a CPAP despite a history of OSA. At home she did have rare hypoglycemia with glucoses as low as 44. She describes occasional "discomfort" in the right lateral abdomen which moves up toward the thorax.  Constitutional: No fever, significant weight change, fatigue  Eyes: No redness, discharge, pain, vision change ENT/mouth: No nasal congestion, purulent discharge, earache, change in hearing, sore throat  Cardiovascular: No chest pain, palpitations, paroxysmal nocturnal dyspnea, claudication, edema  Respiratory: No hemoptysis  Gastrointestinal: No heartburn, dysphagia, nausea /vomiting, rectal bleeding, melena, change in bowels Genitourinary: No dysuria, hematuria, pyuria, incontinence, nocturia Musculoskeletal: No joint stiffness, joint swelling, weakness, pain Dermatologic: No rash, pruritus, change in appearance of skin Neurologic: No dizziness, headache, syncope, seizures, numbness, tingling Psychiatric: No significant anxiety, depression, insomnia, anorexia Endocrine: No change in hair/skin/nails, excessive thirst, excessive hunger, excessive urination  Hematologic/lymphatic: No significant bruising, lymphadenopathy, abnormal bleeding Allergy/immunology: No itchy/watery eyes, significant sneezing, urticaria,  angioedema  Physical exam:  Pertinent or positive findings: She appears profoundly debilitated.  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  Eyebrows are also essentially absent.  Nasal oxygen is  in place.  Respirations are shallow.  Her speech is characterized by short phrases between respirations.  Sternotomy scar is present.  First and second heart sounds are increased.  Colostomy is present.  Pedal pulses are decreased but palpable.  She has trace edema at the sock line.  She is weak to opposition in all extremities, especially lower extremities.  She has diffuse bruising over the forearms.  There is also faint bruising over the left cheek.  There is faint bruising below the lower lip.  Eschar and bruising of the lip itself is present.  Flat verrucoid lesion is present in the left lateral malar area.    General appearance:  no acute distress, increased work of breathing is present.   Lymphatic: No lymphadenopathy about the head, neck, axilla. Eyes: No conjunctival inflammation or lid edema is present. There is no scleral icterus. Ears:  External ear exam shows no significant lesions or deformities.   Nose:  External nasal examination shows no deformity or inflammation. Neck:  No thyromegaly, masses, tenderness noted.    Heart:  Normal rate and regular rhythm without gallop, murmur, click, rub.  Lungs: respirations shallow but without wheezes, rhonchi, rales, rubs. Abdomen: Bowel sounds are normal.  Abdomen is soft and nontender with no organomegaly, hernias, masses. GU: Deferred  Extremities:  No cyanosis, clubbing. Neurologic exam:  Balance, Rhomberg, finger to nose testing could not be completed due to clinical state Skin: Warm & dry w/o tenting. No significant  rash.  See clinical summary under each active problem in the Problem List with associated updated therapeutic plan

## 2020-02-26 NOTE — Assessment & Plan Note (Signed)
Diflucan course completed.

## 2020-02-27 DIAGNOSIS — R627 Adult failure to thrive: Secondary | ICD-10-CM | POA: Insufficient documentation

## 2020-02-27 NOTE — Assessment & Plan Note (Signed)
DNR appropriate Unlikely to survive CPR much less have any meaningful quality of life

## 2020-02-27 NOTE — Assessment & Plan Note (Signed)
O2 sats excellent but her status is tenuous due to multiple advanced  & severe co-morbidities Continue O2 & pulmonary toilet

## 2020-02-27 NOTE — Assessment & Plan Note (Signed)
Trace edema w/o definite acute CHF

## 2020-02-28 ENCOUNTER — Ambulatory Visit: Payer: Medicare Other | Admitting: Physician Assistant

## 2020-02-29 ENCOUNTER — Encounter: Payer: Self-pay | Admitting: Internal Medicine

## 2020-02-29 ENCOUNTER — Non-Acute Institutional Stay (SKILLED_NURSING_FACILITY): Payer: Medicare Other | Admitting: Internal Medicine

## 2020-02-29 DIAGNOSIS — E1159 Type 2 diabetes mellitus with other circulatory complications: Secondary | ICD-10-CM | POA: Diagnosis not present

## 2020-02-29 DIAGNOSIS — E11649 Type 2 diabetes mellitus with hypoglycemia without coma: Secondary | ICD-10-CM | POA: Insufficient documentation

## 2020-02-29 NOTE — Assessment & Plan Note (Addendum)
Symptomatic hypoglycemia with glucose of 38 on 5/26.  Gliburide was discontinued. Oral intake of meals and protein supplement is suboptimal.  This was discussed with her.  They tight sliding scale insulin is unrealistic with poor oral intake and will be discontinued.   As A1c was 7.3% on 01/11/2020 and recorded glucoses indicate the value of 38 and the evening glucose of 359 are outliers; no change will be made in her basal insulin. At her age with advanced, severe comorbidities and A1c of 8% or more is safer than attempting tight diabetic control. Nutrition will be consulted in attempt to improve her oral intake.

## 2020-02-29 NOTE — Patient Instructions (Signed)
See assessment and plan under each diagnosis in the problem list and acutely for this visit 

## 2020-02-29 NOTE — Progress Notes (Signed)
   NURSING HOME LOCATION:  [facility] ROOM NUMBER:  [id number]  CODE STATUS:  DNR  PCP:  Tonia Ghent, MD  [place] [id number]   This is a nursing facility follow up for specific acute issue of symptomatic hypoglycemia.  Interim medical record and care since last [place] visit was updated with review of diagnostic studies and change in clinical status since last visit were documented.  HPI: The patient was noted to be confused and there was concern that this was related to hypoxia.  Attempts were made to employ CPAP which seemed to only make her more agitated and combative.  They contacted the son who stated that she has had episodic hypoglycemia @ home.  Indeed the glucose was noted to be 38. This responded to glucagon and subsequently she was able to eat a sandwich and drink juice. When I saw the patient initially 5/24 I had discontinued glyburide because of the potential risk for hypoglycemia.  Problematic is that the patient's intake has been poor.  She states that she is "not fond" of her food choices here. Morning glucoses have ranged from 203-252.  Evening glucoses range from 120 up to 359.  The latter is an outlier.  Review of systems: She states that she has had hypoglycemia at home typically early in the mornings.  She is aware of the event.  She stated that she did notice associated numbness in her toes in the context of this most recent event.  She was unsure exactly the time the symptoms occurred but believes it was "overnight".  She states that she did eat dinner prior to the event. She states that she is getting Ensure protein supplement but does not care for it. No excessive thirst, excessive hunger, excessive urination   Physical exam:  Pertinent or positive findings: She is sitting upright in the chair and had been eating lunch.  The Styrofoam container revealed that any intake was minimal.  Breath sounds are decreased.  She  exhibits an intermittent rattly, non productive cough.  First heart sound is increased.  Pedal pulses are decreased.  She has extensive bruising over the forearms. Sitting up allowed visualization of bruising over the right upper thorax as well. She has trace edema at the ankles.  General appearance: no acute distress, increased work of breathing is present.   Lymphatic: No lymphadenopathy about the head, neck, axilla. Eyes: No conjunctival inflammation or lid edema is present. There is no scleral icterus. Ears:  External ear exam shows no significant lesions or deformities.   Nose:  External nasal examination shows no deformity or inflammation. Nasal mucosa are pink and moist without lesions, exudates Oral exam:  There is no oropharyngeal erythema or exudate. Neck:  No thyromegaly, masses, tenderness noted.    Heart:  Normal rate and regular rhythm without gallop, murmur, click, rub .  Lungs:  without wheezes, rhonchi, rales, rubs. Abdomen: Bowel sounds are normal. Abdomen is soft and nontender with no organomegaly, hernias, masses. GU: Deferred  Extremities:  No cyanosis, clubbing, edema  Neurologic exam : Balance, Rhomberg, finger to nose testing could not be completed due to clinical state Skin: Warm & dry . No significant rash.  See summary under each active problem in the Problem List with associated updated therapeutic plan

## 2020-03-01 ENCOUNTER — Encounter: Payer: Self-pay | Admitting: Internal Medicine

## 2020-03-02 ENCOUNTER — Inpatient Hospital Stay (HOSPITAL_COMMUNITY)
Admission: EM | Admit: 2020-03-02 | Discharge: 2020-04-04 | DRG: 193 | Disposition: E | Payer: Medicare Other | Source: Skilled Nursing Facility | Attending: Student | Admitting: Student

## 2020-03-02 ENCOUNTER — Other Ambulatory Visit: Payer: Self-pay

## 2020-03-02 ENCOUNTER — Encounter: Payer: Self-pay | Admitting: Internal Medicine

## 2020-03-02 ENCOUNTER — Emergency Department (HOSPITAL_COMMUNITY): Payer: Medicare Other

## 2020-03-02 ENCOUNTER — Encounter (HOSPITAL_COMMUNITY): Payer: Self-pay

## 2020-03-02 ENCOUNTER — Inpatient Hospital Stay (HOSPITAL_COMMUNITY): Payer: Medicare Other

## 2020-03-02 DIAGNOSIS — I480 Paroxysmal atrial fibrillation: Secondary | ICD-10-CM | POA: Diagnosis not present

## 2020-03-02 DIAGNOSIS — E559 Vitamin D deficiency, unspecified: Secondary | ICD-10-CM | POA: Diagnosis present

## 2020-03-02 DIAGNOSIS — I10 Essential (primary) hypertension: Secondary | ICD-10-CM | POA: Diagnosis not present

## 2020-03-02 DIAGNOSIS — I5033 Acute on chronic diastolic (congestive) heart failure: Secondary | ICD-10-CM | POA: Diagnosis not present

## 2020-03-02 DIAGNOSIS — J9621 Acute and chronic respiratory failure with hypoxia: Secondary | ICD-10-CM

## 2020-03-02 DIAGNOSIS — Z9981 Dependence on supplemental oxygen: Secondary | ICD-10-CM

## 2020-03-02 DIAGNOSIS — R5381 Other malaise: Secondary | ICD-10-CM | POA: Diagnosis present

## 2020-03-02 DIAGNOSIS — I5032 Chronic diastolic (congestive) heart failure: Secondary | ICD-10-CM | POA: Diagnosis not present

## 2020-03-02 DIAGNOSIS — Z6821 Body mass index (BMI) 21.0-21.9, adult: Secondary | ICD-10-CM

## 2020-03-02 DIAGNOSIS — E1122 Type 2 diabetes mellitus with diabetic chronic kidney disease: Secondary | ICD-10-CM | POA: Diagnosis present

## 2020-03-02 DIAGNOSIS — Z7982 Long term (current) use of aspirin: Secondary | ICD-10-CM

## 2020-03-02 DIAGNOSIS — Y95 Nosocomial condition: Secondary | ICD-10-CM | POA: Diagnosis present

## 2020-03-02 DIAGNOSIS — E785 Hyperlipidemia, unspecified: Secondary | ICD-10-CM | POA: Diagnosis present

## 2020-03-02 DIAGNOSIS — J9622 Acute and chronic respiratory failure with hypercapnia: Secondary | ICD-10-CM | POA: Diagnosis not present

## 2020-03-02 DIAGNOSIS — E1159 Type 2 diabetes mellitus with other circulatory complications: Secondary | ICD-10-CM | POA: Diagnosis not present

## 2020-03-02 DIAGNOSIS — Z79899 Other long term (current) drug therapy: Secondary | ICD-10-CM

## 2020-03-02 DIAGNOSIS — I4891 Unspecified atrial fibrillation: Secondary | ICD-10-CM | POA: Diagnosis present

## 2020-03-02 DIAGNOSIS — N1832 Chronic kidney disease, stage 3b: Secondary | ICD-10-CM | POA: Diagnosis not present

## 2020-03-02 DIAGNOSIS — G4733 Obstructive sleep apnea (adult) (pediatric): Secondary | ICD-10-CM | POA: Diagnosis present

## 2020-03-02 DIAGNOSIS — J9611 Chronic respiratory failure with hypoxia: Secondary | ICD-10-CM | POA: Diagnosis present

## 2020-03-02 DIAGNOSIS — D508 Other iron deficiency anemias: Secondary | ICD-10-CM | POA: Diagnosis not present

## 2020-03-02 DIAGNOSIS — E7800 Pure hypercholesterolemia, unspecified: Secondary | ICD-10-CM | POA: Diagnosis present

## 2020-03-02 DIAGNOSIS — Z794 Long term (current) use of insulin: Secondary | ICD-10-CM

## 2020-03-02 DIAGNOSIS — I5043 Acute on chronic combined systolic (congestive) and diastolic (congestive) heart failure: Secondary | ICD-10-CM | POA: Diagnosis present

## 2020-03-02 DIAGNOSIS — N183 Chronic kidney disease, stage 3 unspecified: Secondary | ICD-10-CM | POA: Diagnosis present

## 2020-03-02 DIAGNOSIS — Z83438 Family history of other disorder of lipoprotein metabolism and other lipidemia: Secondary | ICD-10-CM

## 2020-03-02 DIAGNOSIS — J984 Other disorders of lung: Secondary | ICD-10-CM | POA: Diagnosis present

## 2020-03-02 DIAGNOSIS — J189 Pneumonia, unspecified organism: Principal | ICD-10-CM

## 2020-03-02 DIAGNOSIS — J45901 Unspecified asthma with (acute) exacerbation: Secondary | ICD-10-CM | POA: Diagnosis not present

## 2020-03-02 DIAGNOSIS — E1151 Type 2 diabetes mellitus with diabetic peripheral angiopathy without gangrene: Secondary | ICD-10-CM | POA: Diagnosis present

## 2020-03-02 DIAGNOSIS — M419 Scoliosis, unspecified: Secondary | ICD-10-CM | POA: Diagnosis present

## 2020-03-02 DIAGNOSIS — Z8249 Family history of ischemic heart disease and other diseases of the circulatory system: Secondary | ICD-10-CM

## 2020-03-02 DIAGNOSIS — Z951 Presence of aortocoronary bypass graft: Secondary | ICD-10-CM

## 2020-03-02 DIAGNOSIS — E1165 Type 2 diabetes mellitus with hyperglycemia: Secondary | ICD-10-CM | POA: Diagnosis present

## 2020-03-02 DIAGNOSIS — Z9181 History of falling: Secondary | ICD-10-CM

## 2020-03-02 DIAGNOSIS — J9601 Acute respiratory failure with hypoxia: Secondary | ICD-10-CM | POA: Diagnosis not present

## 2020-03-02 DIAGNOSIS — R0602 Shortness of breath: Secondary | ICD-10-CM | POA: Diagnosis not present

## 2020-03-02 DIAGNOSIS — Z853 Personal history of malignant neoplasm of breast: Secondary | ICD-10-CM

## 2020-03-02 DIAGNOSIS — Z20822 Contact with and (suspected) exposure to covid-19: Secondary | ICD-10-CM | POA: Diagnosis present

## 2020-03-02 DIAGNOSIS — R64 Cachexia: Secondary | ICD-10-CM | POA: Diagnosis present

## 2020-03-02 DIAGNOSIS — I255 Ischemic cardiomyopathy: Secondary | ICD-10-CM | POA: Diagnosis present

## 2020-03-02 DIAGNOSIS — J9612 Chronic respiratory failure with hypercapnia: Secondary | ICD-10-CM | POA: Diagnosis present

## 2020-03-02 DIAGNOSIS — Z833 Family history of diabetes mellitus: Secondary | ICD-10-CM

## 2020-03-02 DIAGNOSIS — Z8701 Personal history of pneumonia (recurrent): Secondary | ICD-10-CM

## 2020-03-02 DIAGNOSIS — I2510 Atherosclerotic heart disease of native coronary artery without angina pectoris: Secondary | ICD-10-CM | POA: Diagnosis present

## 2020-03-02 DIAGNOSIS — I272 Pulmonary hypertension, unspecified: Secondary | ICD-10-CM | POA: Diagnosis present

## 2020-03-02 DIAGNOSIS — Z933 Colostomy status: Secondary | ICD-10-CM | POA: Diagnosis not present

## 2020-03-02 DIAGNOSIS — Z66 Do not resuscitate: Secondary | ICD-10-CM | POA: Diagnosis present

## 2020-03-02 DIAGNOSIS — E440 Moderate protein-calorie malnutrition: Secondary | ICD-10-CM | POA: Diagnosis present

## 2020-03-02 DIAGNOSIS — Z515 Encounter for palliative care: Secondary | ICD-10-CM | POA: Diagnosis not present

## 2020-03-02 DIAGNOSIS — R41841 Cognitive communication deficit: Secondary | ICD-10-CM | POA: Diagnosis not present

## 2020-03-02 DIAGNOSIS — D509 Iron deficiency anemia, unspecified: Secondary | ICD-10-CM | POA: Diagnosis present

## 2020-03-02 DIAGNOSIS — G4736 Sleep related hypoventilation in conditions classified elsewhere: Secondary | ICD-10-CM | POA: Diagnosis present

## 2020-03-02 DIAGNOSIS — Z9049 Acquired absence of other specified parts of digestive tract: Secondary | ICD-10-CM

## 2020-03-02 DIAGNOSIS — R0902 Hypoxemia: Secondary | ICD-10-CM | POA: Diagnosis not present

## 2020-03-02 DIAGNOSIS — M810 Age-related osteoporosis without current pathological fracture: Secondary | ICD-10-CM | POA: Diagnosis present

## 2020-03-02 DIAGNOSIS — I130 Hypertensive heart and chronic kidney disease with heart failure and stage 1 through stage 4 chronic kidney disease, or unspecified chronic kidney disease: Secondary | ICD-10-CM | POA: Diagnosis present

## 2020-03-02 DIAGNOSIS — Z803 Family history of malignant neoplasm of breast: Secondary | ICD-10-CM

## 2020-03-02 DIAGNOSIS — R627 Adult failure to thrive: Secondary | ICD-10-CM | POA: Diagnosis present

## 2020-03-02 DIAGNOSIS — L8990 Pressure ulcer of unspecified site, unspecified stage: Secondary | ICD-10-CM | POA: Insufficient documentation

## 2020-03-02 DIAGNOSIS — M6281 Muscle weakness (generalized): Secondary | ICD-10-CM | POA: Diagnosis not present

## 2020-03-02 DIAGNOSIS — L89151 Pressure ulcer of sacral region, stage 1: Secondary | ICD-10-CM | POA: Diagnosis present

## 2020-03-02 DIAGNOSIS — Z825 Family history of asthma and other chronic lower respiratory diseases: Secondary | ICD-10-CM

## 2020-03-02 DIAGNOSIS — Z789 Other specified health status: Secondary | ICD-10-CM | POA: Diagnosis not present

## 2020-03-02 LAB — PROTIME-INR
INR: 1 (ref 0.8–1.2)
Prothrombin Time: 12.8 seconds (ref 11.4–15.2)

## 2020-03-02 LAB — APTT: aPTT: 28 seconds (ref 24–36)

## 2020-03-02 LAB — CBC WITH DIFFERENTIAL/PLATELET
Abs Immature Granulocytes: 1.2 10*3/uL — ABNORMAL HIGH (ref 0.00–0.07)
Basophils Absolute: 0.1 10*3/uL (ref 0.0–0.1)
Basophils Relative: 0 %
Eosinophils Absolute: 0 10*3/uL (ref 0.0–0.5)
Eosinophils Relative: 0 %
HCT: 32.3 % — ABNORMAL LOW (ref 36.0–46.0)
Hemoglobin: 9.5 g/dL — ABNORMAL LOW (ref 12.0–15.0)
Immature Granulocytes: 6 %
Lymphocytes Relative: 6 %
Lymphs Abs: 1.2 10*3/uL (ref 0.7–4.0)
MCH: 29 pg (ref 26.0–34.0)
MCHC: 29.4 g/dL — ABNORMAL LOW (ref 30.0–36.0)
MCV: 98.5 fL (ref 80.0–100.0)
Monocytes Absolute: 2.9 10*3/uL — ABNORMAL HIGH (ref 0.1–1.0)
Monocytes Relative: 15 %
Neutro Abs: 14.5 10*3/uL — ABNORMAL HIGH (ref 1.7–7.7)
Neutrophils Relative %: 73 %
Platelets: 343 10*3/uL (ref 150–400)
RBC: 3.28 MIL/uL — ABNORMAL LOW (ref 3.87–5.11)
RDW: 15.5 % (ref 11.5–15.5)
WBC: 19.9 10*3/uL — ABNORMAL HIGH (ref 4.0–10.5)
nRBC: 0.4 % — ABNORMAL HIGH (ref 0.0–0.2)

## 2020-03-02 LAB — POC SARS CORONAVIRUS 2 AG -  ED: SARS Coronavirus 2 Ag: NEGATIVE

## 2020-03-02 LAB — POCT I-STAT EG7
Acid-Base Excess: 12 mmol/L — ABNORMAL HIGH (ref 0.0–2.0)
Bicarbonate: 41.3 mmol/L — ABNORMAL HIGH (ref 20.0–28.0)
Calcium, Ion: 1.27 mmol/L (ref 1.15–1.40)
HCT: 33 % — ABNORMAL LOW (ref 36.0–46.0)
Hemoglobin: 11.2 g/dL — ABNORMAL LOW (ref 12.0–15.0)
O2 Saturation: 80 %
Potassium: 5.1 mmol/L (ref 3.5–5.1)
Sodium: 138 mmol/L (ref 135–145)
TCO2: 44 mmol/L — ABNORMAL HIGH (ref 22–32)
pCO2, Ven: 84.7 mmHg (ref 44.0–60.0)
pH, Ven: 7.296 (ref 7.250–7.430)
pO2, Ven: 52 mmHg — ABNORMAL HIGH (ref 32.0–45.0)

## 2020-03-02 LAB — COMPREHENSIVE METABOLIC PANEL
ALT: 16 U/L (ref 0–44)
AST: 17 U/L (ref 15–41)
Albumin: 3.6 g/dL (ref 3.5–5.0)
Alkaline Phosphatase: 75 U/L (ref 38–126)
Anion gap: 9 (ref 5–15)
BUN: 41 mg/dL — ABNORMAL HIGH (ref 8–23)
CO2: 36 mmol/L — ABNORMAL HIGH (ref 22–32)
Calcium: 9.6 mg/dL (ref 8.9–10.3)
Chloride: 95 mmol/L — ABNORMAL LOW (ref 98–111)
Creatinine, Ser: 1.41 mg/dL — ABNORMAL HIGH (ref 0.44–1.00)
GFR calc Af Amer: 40 mL/min — ABNORMAL LOW (ref >60)
GFR calc non Af Amer: 35 mL/min — ABNORMAL LOW (ref >60)
Glucose, Bld: 167 mg/dL — ABNORMAL HIGH (ref 70–99)
Potassium: 5.5 mmol/L — ABNORMAL HIGH (ref 3.5–5.1)
Sodium: 140 mmol/L (ref 135–145)
Total Bilirubin: 0.5 mg/dL (ref 0.3–1.2)
Total Protein: 6.7 g/dL (ref 6.5–8.1)

## 2020-03-02 LAB — PROCALCITONIN: Procalcitonin: 0.12 ng/mL

## 2020-03-02 LAB — TROPONIN I (HIGH SENSITIVITY)
Troponin I (High Sensitivity): 25 ng/L — ABNORMAL HIGH (ref <18)
Troponin I (High Sensitivity): 22 ng/L — ABNORMAL HIGH (ref <18)

## 2020-03-02 LAB — MAGNESIUM: Magnesium: 2.1 mg/dL (ref 1.7–2.4)

## 2020-03-02 LAB — LACTIC ACID, PLASMA: Lactic Acid, Venous: 0.8 mmol/L (ref 0.5–1.9)

## 2020-03-02 LAB — CBG MONITORING, ED: Glucose-Capillary: 146 mg/dL — ABNORMAL HIGH (ref 70–99)

## 2020-03-02 MED ORDER — SODIUM CHLORIDE 0.9 % IV SOLN
2.0000 g | Freq: Once | INTRAVENOUS | Status: AC
  Administered 2020-03-02: 2 g via INTRAVENOUS
  Filled 2020-03-02: qty 2

## 2020-03-02 MED ORDER — INSULIN ASPART 100 UNIT/ML ~~LOC~~ SOLN
0.0000 [IU] | SUBCUTANEOUS | Status: DC
  Administered 2020-03-03: 1 [IU] via SUBCUTANEOUS
  Administered 2020-03-03 (×2): 2 [IU] via SUBCUTANEOUS
  Administered 2020-03-03: 1 [IU] via SUBCUTANEOUS

## 2020-03-02 MED ORDER — VANCOMYCIN HCL IN DEXTROSE 1-5 GM/200ML-% IV SOLN
1000.0000 mg | Freq: Once | INTRAVENOUS | Status: AC
  Administered 2020-03-02: 1000 mg via INTRAVENOUS
  Filled 2020-03-02: qty 200

## 2020-03-02 MED ORDER — SODIUM CHLORIDE 0.9 % IV SOLN
2.0000 g | INTRAVENOUS | Status: DC
  Filled 2020-03-02: qty 2

## 2020-03-02 MED ORDER — ALBUTEROL SULFATE HFA 108 (90 BASE) MCG/ACT IN AERS
2.0000 | INHALATION_SPRAY | RESPIRATORY_TRACT | Status: DC | PRN
  Filled 2020-03-02: qty 6.7

## 2020-03-02 MED ORDER — VANCOMYCIN HCL 500 MG/100ML IV SOLN
500.0000 mg | INTRAVENOUS | Status: DC
  Filled 2020-03-02: qty 100

## 2020-03-02 MED ORDER — AMLODIPINE BESYLATE 10 MG PO TABS
10.0000 mg | ORAL_TABLET | Freq: Every day | ORAL | Status: DC
  Administered 2020-03-03: 10 mg via ORAL
  Filled 2020-03-02: qty 1

## 2020-03-02 MED ORDER — ALBUTEROL SULFATE HFA 108 (90 BASE) MCG/ACT IN AERS
8.0000 | INHALATION_SPRAY | Freq: Once | RESPIRATORY_TRACT | Status: AC
  Administered 2020-03-02: 8 via RESPIRATORY_TRACT
  Filled 2020-03-02: qty 6.7

## 2020-03-02 MED ORDER — HEPARIN SODIUM (PORCINE) 5000 UNIT/ML IJ SOLN
5000.0000 [IU] | Freq: Three times a day (TID) | INTRAMUSCULAR | Status: DC
  Administered 2020-03-02 – 2020-03-03 (×2): 5000 [IU] via SUBCUTANEOUS
  Filled 2020-03-02 (×2): qty 1

## 2020-03-02 MED ORDER — AEROCHAMBER PLUS FLO-VU LARGE MISC
Status: AC
  Filled 2020-03-02: qty 1

## 2020-03-02 MED ORDER — IOHEXOL 350 MG/ML SOLN
60.0000 mL | Freq: Once | INTRAVENOUS | Status: AC | PRN
  Administered 2020-03-02: 60 mL via INTRAVENOUS

## 2020-03-02 MED ORDER — FLUTICASONE PROPIONATE HFA 44 MCG/ACT IN AERO: 2.0000 | INHALATION_SPRAY | Freq: Two times a day (BID) | RESPIRATORY_TRACT | Status: DC

## 2020-03-02 MED ORDER — SOTALOL HCL 80 MG PO TABS
40.0000 mg | ORAL_TABLET | Freq: Two times a day (BID) | ORAL | Status: DC
  Administered 2020-03-03: 40 mg via ORAL
  Filled 2020-03-02 (×2): qty 0.5

## 2020-03-02 MED ORDER — METHYLPREDNISOLONE SODIUM SUCC 125 MG IJ SOLR
125.0000 mg | Freq: Once | INTRAMUSCULAR | Status: AC
  Administered 2020-03-02: 125 mg via INTRAVENOUS
  Filled 2020-03-02: qty 2

## 2020-03-02 MED ORDER — BENAZEPRIL HCL 20 MG PO TABS
20.0000 mg | ORAL_TABLET | Freq: Every day | ORAL | Status: DC
  Administered 2020-03-03: 20 mg via ORAL
  Filled 2020-03-02: qty 4
  Filled 2020-03-02: qty 1

## 2020-03-02 NOTE — ED Notes (Signed)
Pt son expressed interest in speaking with palliative care. Dr. Jerilynn Mages notified.

## 2020-03-02 NOTE — ED Provider Notes (Signed)
[facility] EMERGENCY DEPARTMENT Provider Note   CSN: [id number] Arrival date & time: 02/07/2020  1806     History Chief Complaint  Patient presents with  . Shortness of Breath    [name] is a 85 y.o. female.  85 yo F with a chief complaints of shortness of breath.  She was found today at her skilled nursing facility to be in the 57s for her oxygen saturation on her home 4 L of oxygen.  She denies any significant change in her cough denies fevers denies chest pain or pressure.  She apparently was without oxygen when she was found initially and then did not improve significantly with her 4 L.  EMS placed her on nonrebreather and had improvement into the low 90s.  She says she feels better on this amount of oxygen.  Old records were reviewed and the patient was recently in the hospital she required intubation for multifocal pneumonia.  The history is provided by the patient.  Shortness of Breath Severity:  Moderate Onset quality:  Sudden Duration:  1 day Timing:  Constant Progression:  Unchanged Chronicity:  Recurrent Relieved by:  Nothing Worsened by:  Nothing Ineffective treatments:  None tried Associated symptoms: no chest pain, no fever, no headaches, no vomiting and no wheezing        Past Medical History:  Diagnosis Date  . Anemia, iron deficiency   . Atrial fibrillation ([place]) 11/28/2010   2D Echo - EF-30-35, left atrium moderate to severely dilated, right ventricle moderately dilated, tricuspid valve mild-moderate regurgitation  . Cancer [facility])    R beast cancer followed by Dr. Hassell Done  . CAP (community acquired pneumonia)   . Chronic kidney disease (CKD), stage II (mild)   . Coronary artery disease   . Diabetes mellitus, type 2 ([place])   . Diverticulitis   . History of breast cancer    Followed yearly by [facility]  . Hyperlipemia   . Hypertension   . Nontraumatic retroperitoneal hematoma   . Osteoporosis   . Peripheral vascular disease [facility])     Patient Active Problem List   Diagnosis Date Noted  . Acute respiratory failure with hypoxia ([place]) 03/03/2020  . Hypoglycemia associated with diabetes ([place]) 02/29/2020  . Failure to thrive syndrome, adult 02/27/2020  . Acute encephalopathy 02/26/2020  . Acute diastolic CHF (congestive heart failure) ([place]) 02/17/2020  . Acute on chronic respiratory failure with hypoxia ([place]) 02/17/2020  . Oral thrush 01/14/2020  . Cough 01/13/2018  . Medicare annual wellness visit, subsequent 09/09/2017  . Renal cyst 09/09/2017  . Sleep related hypoxia 06/02/2017  . Fall at home 05/27/2017  . Abnormal CBC 05/27/2017  . Sleep apnea 04/14/2017  . Restrictive lung disease due to kyphoscoliosis 04/14/2017  . Asthma 02/24/2017  . Chronic respiratory failure with hypoxia and hypercapnia ([place]) 02/24/2017  . Pancreatic cyst 02/22/2017  . Tremor 01/20/2017  . Hypoxia 11/20/2016  . Loss of weight 09/16/2016  . Chronic diastolic heart failure ([place]) 11/11/2015  . Neck pain 01/09/2015  . Advance care planning 01/04/2015  . Risk for falls 08/24/2013  . Cardiomyopathy, ischemic 08/24/2013  . Left tibial fracture 05/05/2013  . Coronary artery disease   . Hypercholesterolemia   . Chronic kidney disease, stage III (moderate) 08/12/2012  . Colostomy in place [facility]) 08/12/2012  . Vitamin D deficiency 11/28/2008  . CAROTID BRUIT 05/24/2008  . Iron deficiency anemia 04/20/2007  . Type 2 diabetes mellitus with vascular disease ([place]) 04/19/2007  . HLD (hyperlipidemia) 04/19/2007  .  Essential hypertension 04/19/2007  . Atrial fibrillation ([place]) 04/19/2007  . PAD (peripheral artery disease) ([place]) 04/19/2007  . OSTEOPOROSIS 04/19/2007    Past Surgical History:  Procedure Laterality Date  . APPENDECTOMY  1962  . BREAST SURGERY  02/01   Quadrectomy Breast cancer (Dr. Hassell Done)  . CARDIAC CATHETERIZATION  2000   Iliac Stent (Dr. Juanita Craver) yearly follow-up  . CARDIAC CATHETERIZATION  01/25/02   High grade LAD  disease, total of 3 vess, involvement  . CARDIAC CATHETERIZATION  01/13/07   SV graft to diag. occluded but good backfill o/w ok  . CARDIAC CATHETERIZATION  02/14/10   PTCA & stent vein graft to OM/RCA ([place])  . CARDIAC CATHETERIZATION  04/17/2011   Saphenous vein grafts to the RCA 100% occluded, to the OM 100% occluded, and the diagonal 100% occluded. Internal mammary artery to the LAD, widely patent.  Marland Kitchen CATARACT EXTRACTION  10/10 and 02/11  . COLOSTOMY    . CORONARY ARTERY BYPASS GRAFT  02/09/02   X 4 ([place])  . DILATION AND CURETTAGE OF UTERUS  1971    miscarrage, abnormal bleeding  . DILATION AND CURETTAGE OF UTERUS  1995   Menorrhagia  . HEMORRHOID SURGERY  09/11/04   Binding Hassell Done)  . PARTIAL COLECTOMY    . SHOULDER ARTHROSCOPY  05/04   Right, with rotator cuff debridement     OB History   No obstetric history on file.     Family History  Problem Relation Age of Onset  . Hypertension Mother   . Diabetes Mother   . Heart disease Mother   . Hypertension Father   . Heart disease Father 27       Heart attack  . Heart disease Brother        CHF  . Hypertension Brother   . COPD Brother        bronchial, frequent pneumonia  . Hypertension Brother   . Hyperlipidemia Brother   . Heart disease Brother        CABG  . Hypertension Brother   . Gout Brother   . Obesity Brother   . Diabetes Son   . Hypertension Son   . Breast cancer Maternal Aunt   . Colon cancer Neg Hx     Social History   Tobacco Use  . Smoking status: Never Smoker  . Smokeless tobacco: Never Used  Substance Use Topics  . Alcohol use: No    Alcohol/week: 0.0 standard drinks  . Drug use: No    Home Medications Prior to Admission medications   Medication Sig Start Date End Date Taking? Authorizing Provider  albuterol (VENTOLIN HFA) 108 (90 Base) MCG/ACT inhaler Inhale 2 puffs into the lungs every 4 (four) hours as needed for wheezing or shortness of breath. 02/08/20   Martyn Ehrich,  NP  amLODipine (NORVASC) 10 MG tablet TAKE ONE TABLET (10 MG) BY MOUTH DAILY. 08/11/19   Croitoru, Mihai, MD  aspirin EC 81 MG tablet Take 81 mg by mouth at bedtime.     [provider]  atorvastatin (LIPITOR) 80 MG tablet Take 1 tablet (80 mg total) by mouth daily at 6 PM. 05/18/19   Croitoru, Mihai, MD  BD INSULIN SYRINGE U/F 31G X 5/16" 0.3 ML MISC USE DAILY AND AS DIRECTED SLIDING SCALE. MULTIPLE INJECTIONS OF INSULIN PER DAY Patient not taking: Reported on 02/29/2020 01/16/19   Tonia Ghent, MD  benazepril (LOTENSIN) 20 MG tablet Take 1 tablet (20 mg total) by mouth daily. 08/24/19  Croitoru, Mihai, MD  Cholecalciferol (VITAMIN D) 1000 UNITS capsule Take 1,000 Units by mouth 2 (two) times daily.      [provider]  FLOVENT HFA 44 MCG/ACT inhaler TAKE 2 PUFFS BY MOUTH TWICE A DAY 12/28/19   Chesley Mires, MD  fluticasone (FLONASE) 50 MCG/ACT nasal spray Place 1 spray into both nostrils daily. 02/08/20   Martyn Ehrich, NP  insulin aspart (NOVOLOG FLEXPEN) 100 UNIT/ML FlexPen Inject 4-6 Units into the skin 3 (three) times daily with meals. 200-250 = 4 units, 251-300 = 5 units, 301-350 = 6 units    [provider]  insulin glargine (LANTUS SOLOSTAR) 100 UNIT/ML Solostar Pen Inject 15 Units into the skin at bedtime.    [provider]  meclizine (ANTIVERT) 25 MG tablet Take 1 tablet (25 mg total) by mouth daily as needed. For dizziness 01/03/15   Tonia Ghent, MD  nitroGLYCERIN (NITROSTAT) 0.4 MG SL tablet Place 1 tablet (0.4 mg total) under the tongue as needed for chest pain. As directed 01/19/19   Tonia Ghent, MD  [facility] ULTRA test strip USE TO CHECK BLOOD SUGAR 4 TIMES A DAY. INSULIN-DEPENDENT. DIAGNOSIS: E11.9 Patient not taking: Reported on 02/29/2020 11/13/19   Tonia Ghent, MD  OXYGEN Inhale 2-4 L/min into the lungs as needed (To keep O2 sats >90%.).    [provider]  Polyvinyl Alcohol-Povidone (REFRESH OP) Place 1 drop into both  eyes daily as needed (for dry eyes).     [provider]  ranolazine (RANEXA) 500 MG 12 hr tablet Take 1 tablet (500 mg total) by mouth 2 (two) times daily. TAKE 1 TABLET BY MOUTH TWICE A DAY 08/24/19   Croitoru, Mihai, MD  sotalol (BETAPACE) 80 MG tablet TAKE 0.5 TABLETS (40 MG TOTAL) BY MOUTH 2 (TWO) TIMES DAILY. 08/25/19   Croitoru, Mihai, MD    Allergies    Actos [pioglitazone], Metformin and related, and Zocor [simvastatin]  Review of Systems   Review of Systems  Constitutional: Negative for chills and fever.  HENT: Negative for congestion and rhinorrhea.   Eyes: Negative for redness and visual disturbance.  Respiratory: Positive for shortness of breath. Negative for wheezing.   Cardiovascular: Negative for chest pain and palpitations.  Gastrointestinal: Negative for nausea and vomiting.  Genitourinary: Negative for dysuria and urgency.  Musculoskeletal: Negative for arthralgias and myalgias.  Skin: Negative for pallor and wound.  Neurological: Negative for dizziness and headaches.    Physical Exam Updated Vital Signs BP (!) 129/55   Pulse 63   Temp 97.9 F (36.6 C) (Rectal)   Resp (!) 30   Ht 5\' 2"  (1.575 m)   Wt 49.9 kg   SpO2 98%   BMI 20.12 kg/m   Physical Exam Vitals and nursing note reviewed.  Constitutional:      General: She is not in acute distress.    Appearance: She is well-developed. She is not diaphoretic.     Comments: Marked pallor, cachectic  HENT:     Head: Normocephalic and atraumatic.  Eyes:     Pupils: Pupils are equal, round, and reactive to light.  Cardiovascular:     Rate and Rhythm: Normal rate and regular rhythm.     Heart sounds: No murmur. No friction rub. No gallop.   Pulmonary:     Effort: Pulmonary effort is normal.     Breath sounds: Decreased breath sounds present. No wheezing, rhonchi or rales.     Comments: Diminished breath sounds in all  fields Abdominal:     General: There is no distension.     Palpations: Abdomen  is soft.     Tenderness: There is no abdominal tenderness.  Musculoskeletal:        General: No tenderness.     Cervical back: Normal range of motion and neck supple.  Skin:    General: Skin is warm and dry.  Neurological:     Mental Status: She is alert and oriented to person, place, and time.  Psychiatric:        Behavior: Behavior normal.     ED Results / Procedures / Treatments   Labs (all labs ordered are listed, but only abnormal results are displayed) Labs Reviewed  COMPREHENSIVE METABOLIC PANEL - Abnormal; Notable for the following components:      Result Value   Potassium 5.5 (*)    Chloride 95 (*)    CO2 36 (*)    Glucose, Bld 167 (*)    BUN 41 (*)    Creatinine, Ser 1.41 (*)    GFR calc non Af Amer 35 (*)    GFR calc Af Amer 40 (*)    All other components within normal limits  CBC WITH DIFFERENTIAL/PLATELET - Abnormal; Notable for the following components:   WBC 19.9 (*)    RBC 3.28 (*)    Hemoglobin 9.5 (*)    HCT 32.3 (*)    MCHC 29.4 (*)    nRBC 0.4 (*)    Neutro Abs 14.5 (*)    Monocytes Absolute 2.9 (*)    Abs Immature Granulocytes 1.20 (*)    All other components within normal limits  POCT I-STAT EG7 - Abnormal; Notable for the following components:   pCO2, Ven 84.7 (*)    pO2, Ven 52.0 (*)    Bicarbonate 41.3 (*)    TCO2 44 (*)    Acid-Base Excess 12.0 (*)    HCT 33.0 (*)    Hemoglobin 11.2 (*)    All other components within normal limits  CBG MONITORING, ED - Abnormal; Notable for the following components:   Glucose-Capillary 146 (*)    All other components within normal limits  TROPONIN I (HIGH SENSITIVITY) - Abnormal; Notable for the following components:   Troponin I (High Sensitivity) 25 (*)    All other components within normal limits  CULTURE, BLOOD (ROUTINE X 2)  CULTURE, BLOOD (ROUTINE X 2)  URINE CULTURE  EXPECTORATED SPUTUM ASSESSMENT W REFEX TO RESP CULTURE  LACTIC ACID, PLASMA  APTT  PROTIME-INR  MAGNESIUM  LACTIC ACID,  PLASMA  URINALYSIS, ROUTINE W REFLEX MICROSCOPIC  PROCALCITONIN  BLOOD GAS, ARTERIAL  COMPREHENSIVE METABOLIC PANEL  CBC  BRAIN NATRIURETIC PEPTIDE  POC SARS CORONAVIRUS 2 AG -  ED    EKG None  Radiology DG Chest Port 1 View  Result Date: 02/21/2020 CLINICAL DATA:  Shortness of breath EXAM: PORTABLE CHEST 1 VIEW COMPARISON:  Feb 19, 2020 FINDINGS: There is cardiomegaly with pulmonary venous hypertension. There are pleural effusions bilaterally with areas of interstitial pulmonary edema. There is airspace opacity in the left upper lobe. Much of the right upper lobe is obscured by the patient's mandible. Patient is status post coronary artery bypass grafting. There is aortic atherosclerosis. No bone lesions. No bone lesions. IMPRESSION: 1. Cardiomegaly with pulmonary vascular congestion. Pleural effusions bilaterally with interstitial edema. Appearance indicative of a degree of congestive heart failure. 2. Airspace opacity left upper lobe, likely pneumonia. Alveolar edema may be present as well in this area.  Note that much of the patient's right upper lobe is obscured by the patient's mandible. 3.  Status post coronary artery bypass grafting. 4.  Aortic Atherosclerosis (ICD10-I70.0). Electronically Signed   By: Lowella Grip III M.D.   On: 03/03/2020 19:41    Procedures Procedures (including critical care time)  Medications Ordered in ED Medications  vancomycin (VANCOCIN) IVPB 1000 mg/200 mL premix (has no administration in time range)  ceFEPIme (MAXIPIME) 2 g in sodium chloride 0.9 % 100 mL IVPB (2 g Intravenous New Bag/Given 02/13/2020 2037)  amLODipine (NORVASC) tablet 10 mg (has no administration in time range)  benazepril (LOTENSIN) tablet 20 mg (has no administration in time range)  sotalol (BETAPACE) tablet 40 mg (has no administration in time range)  albuterol (VENTOLIN HFA) 108 (90 Base) MCG/ACT inhaler 2 puff (has no administration in time range)  fluticasone (FLOVENT HFA) 44  MCG/ACT inhaler 2 puff (has no administration in time range)  insulin aspart (novoLOG) injection 0-9 Units (has no administration in time range)  heparin injection 5,000 Units (has no administration in time range)  ceFEPIme (MAXIPIME) 2 g in sodium chloride 0.9 % 100 mL IVPB (has no administration in time range)  vancomycin (VANCOREADY) IVPB 500 mg/100 mL (has no administration in time range)  albuterol (VENTOLIN HFA) 108 (90 Base) MCG/ACT inhaler 8 puff (8 puffs Inhalation Given 02/27/2020 1935)  methylPREDNISolone sodium succinate (SOLU-MEDROL) 125 mg/2 mL injection 125 mg (125 mg Intravenous Given 02/29/2020 1937)  AeroChamber Plus Flo-Vu Large MISC (  Given 02/17/2020 1950)    ED Course  I have reviewed the triage vital signs and the nursing notes.  Pertinent labs & imaging results that were available during my care of the patient were reviewed by me and considered in my medical decision making (see chart for details).    MDM Rules/Calculators/A&P                      85 yo F with a chief complaints of hypoxia.  Patient was recently in the hospital with multifocal pneumonia.Now at a skilled nursing facility.  Typically on 4 L of oxygen at all times.  She was found without oxygen with an oxygen saturation in the 50s.  Placed on her 4 L without improvement.  EMS was called and placed patient on nonrebreather.  Improved into the low 90s.  She tells me she was having trouble breathing earlier but now feels better.  Obtain laboratory evaluation chest x-ray reassess.  CRITICAL CARE Performed by: Cecilio Asper   Total critical care time: 35 minutes  Critical care time was exclusive of separately billable procedures and treating other patients.  Critical care was necessary to treat or prevent imminent or life-threatening deterioration.  Critical care was time spent personally by me on the following activities: development of treatment plan with patient and/or surrogate as well as nursing,  discussions with consultants, evaluation of patient's response to treatment, examination of patient, obtaining history from patient or surrogate, ordering and performing treatments and interventions, ordering and review of laboratory studies, ordering and review of radiographic studies, pulse oximetry and re-evaluation of patient's condition.   The patients results and plan were reviewed and discussed.   Any x-rays performed were independently reviewed by myself.   Differential diagnosis were considered with the presenting HPI.  Medications  vancomycin (VANCOCIN) IVPB 1000 mg/200 mL premix (has no administration in time range)  ceFEPIme (MAXIPIME) 2 g in sodium chloride 0.9 % 100 mL IVPB (2 g  Intravenous New Bag/Given 02/05/2020 2037)  amLODipine (NORVASC) tablet 10 mg (has no administration in time range)  benazepril (LOTENSIN) tablet 20 mg (has no administration in time range)  sotalol (BETAPACE) tablet 40 mg (has no administration in time range)  albuterol (VENTOLIN HFA) 108 (90 Base) MCG/ACT inhaler 2 puff (has no administration in time range)  fluticasone (FLOVENT HFA) 44 MCG/ACT inhaler 2 puff (has no administration in time range)  insulin aspart (novoLOG) injection 0-9 Units (has no administration in time range)  heparin injection 5,000 Units (has no administration in time range)  ceFEPIme (MAXIPIME) 2 g in sodium chloride 0.9 % 100 mL IVPB (has no administration in time range)  vancomycin (VANCOREADY) IVPB 500 mg/100 mL (has no administration in time range)  albuterol (VENTOLIN HFA) 108 (90 Base) MCG/ACT inhaler 8 puff (8 puffs Inhalation Given 02/21/2020 1935)  methylPREDNISolone sodium succinate (SOLU-MEDROL) 125 mg/2 mL injection 125 mg (125 mg Intravenous Given 02/07/2020 1937)  AeroChamber Plus Flo-Vu Large MISC (  Given 02/18/2020 1950)    Vitals:   03/01/2020 1945 02/19/2020 2000 02/11/2020 2015 02/19/2020 2030  BP: (!) 138/58 (!) 143/59 (!) 137/59 (!) 129/55  Pulse: 66 65 66 63  Resp: (!) 23  (!) 33 (!) 31 (!) 30  Temp:      TempSrc:      SpO2: 100% 97% 100% 98%  Weight:      Height:        Final diagnoses:  Acute on chronic respiratory failure with hypoxia and hypercapnia (HCC)  HCAP (healthcare-associated pneumonia)    Admission/ observation were discussed with the admitting physician, patient and/or family and they are comfortable with the plan.   Final Clinical Impression(s) / ED Diagnoses Final diagnoses:  Acute on chronic respiratory failure with hypoxia and hypercapnia ([place])  HCAP (healthcare-associated pneumonia)    Rx / DC Orders ED Discharge Orders    None       Deno Etienne, DO 02/24/2020 2057

## 2020-03-02 NOTE — ED Triage Notes (Signed)
Pt BIB [facility] from [facility], called out for decreasing sats throughout the day, was in the 50's on her typical 4L, came up to 93% on nonrebreather. 85% on nonrebreather on arrival. Normotensive for EMS, NSR on 12 lead. Recent admission for pneumonia.

## 2020-03-02 NOTE — Progress Notes (Signed)
Pharmacy Antibiotic Note  [name] is a 85 y.o. female admitted on 02/11/2020 with pneumonia.  Pharmacy has been consulted for vancomycin and cefepime dosing. Pt is afebrile but WBC is elevated at 19.9. SCr is elevated at 1.41 but lactic acid is normal.   Plan: Vanc 1gm IV x 1 then 500mg  IV Q24H Cefepime 2gm IV Q24H F/u renal fxn, C&S, clinical status and trough at SS  Height: 5\' 2"  (157.5 cm) Weight: 49.9 kg (110 lb) IBW/kg (Calculated) : 50.1  Temp (24hrs), Avg:97.6 F (36.4 C), Min:97.3 F (36.3 C), Max:97.9 F (36.6 C)  Recent Labs  Lab 03/03/2020 1854  WBC 19.9*  CREATININE 1.41*  LATICACIDVEN 0.8    Estimated Creatinine Clearance: 24.2 mL/min (A) (by C-G formula based on SCr of 1.41 mg/dL (H)).    Allergies  Allergen Reactions  . Actos [Pioglitazone] Other (See Comments)    edema  . Metformin And Related Other (See Comments)    Held due to renal function  . Zocor [Simvastatin] Other (See Comments)    Myalgias    Antimicrobials this admission: Vanc 5/29>> Cefepime 5/29>>  Dose adjustments this admission: N/A  Microbiology results: Pending  Thank you for allowing pharmacy to be a part of this patient's care.  Samanthan Dugo, Rande Lawman 02/27/2020 8:54 PM

## 2020-03-02 NOTE — H&P (Addendum)
[facility] History and Physical  JEYLIN PLOETZ [id number] DOB: March 12, 1935 DOA: 02/09/2020  Referring EDP: Tyrone Nine PCP: Tonia Ghent, MD   Chief Complaint: SOB  HPI: [name] is a 85 y.o. female with PMH significant for afib, cardiomyopathy, chronic diastolic heart failure, CAD, HTN, HLD, asthma, restrictive lung disease with kyphoscoliosis, chronic respiratory failure, OSA, sleep related hypoxemia and Type 2 DM who presented to ED with SOB and admitted for acute on chronic respiratory failure presumed secondary to HCAP.  Hx provided by patient and her son. She was just discharged on 5/22 to rehab after a hospital stay for multifocal PNA in which she was intubated. Patient previously on 3L and went to rehab on 4L. Son reports she had been doing well. Since last admission he has noted a few small cognitive delays but she has otherwise been acting normally. Patient reports diminished appetite. Today, patient was found to not have O2 in place and noted to have O2 sats in the 50's that did not significantly improve with placement of 4L. EMS called and placed on non-rebreather with improvement in sats. Denies headache, dizziness, fever, chills, cough, chest pain, abdominal pain, nausea, vomiting, diarrhea, constipation, dysuria, hematuria, hematochezia, melena, difficulty moving arms/legs, speech difficulty or any other complaints.  In the ED: Mild hypertension with intermittently elevated RR. Afebrile. On Nonrebreather with 15L and normal O2 sats on admission. Labs remarkable for Lactate, INR, PTT WNL. K 5.5, Cl 95, glucose 167, Cr 1.41, WBC 19.9, Hgb 9.5, Trop 25. VBG 7.296/84.7/52/41.  CXR:  1. Cardiomegaly with pulmonary vascular congestion. Pleural effusions bilaterally with interstitial edema. Appearance indicative of a degree of congestive heart failure.  2. Airspace opacity left upper lobe, likely pneumonia. Alveolar edema may be present as well in this area. Note that much  of the patient's right upper lobe is obscured by the patient's mandible.  3.  Status post coronary artery bypass grafting.  4.  Aortic Atherosclerosis (ICD10-I70.0).  Patient was given Albuterol, Methylpred, Vanc and Cefepime. Called for admission for acute on chronic respiratory failure.  Review of Systems:  All other systems negative unless noted above in HPI.   Past Medical History:  Diagnosis Date  . Anemia, iron deficiency   . Atrial fibrillation ([place]) 11/28/2010   2D Echo - EF-30-35, left atrium moderate to severely dilated, right ventricle moderately dilated, tricuspid valve mild-moderate regurgitation  . 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 CAP (community acquired pneumonia)   . Chronic kidney disease (CKD), stage II (mild)   . Coronary artery disease   . Diabetes mellitus, type 2 ([place])   . Diverticulitis   . History of breast cancer    Followed yearly by [facility]  . Hyperlipemia   . Hypertension   . Nontraumatic retroperitoneal hematoma   . Osteoporosis   . Peripheral vascular disease [facility])    Past Surgical History:  Procedure Laterality Date  . APPENDECTOMY  1962  . BREAST SURGERY  02/01   Quadrectomy Breast cancer (Dr. Hassell Done)  . CARDIAC CATHETERIZATION  2000   Iliac Stent (Dr. Juanita Craver) yearly follow-up  . CARDIAC CATHETERIZATION  01/25/02   High grade LAD disease, total of 3 vess, involvement  . CARDIAC CATHETERIZATION  01/13/07   SV graft to diag. occluded but good backfill o/w ok  . CARDIAC CATHETERIZATION  02/14/10   PTCA & stent vein graft to OM/RCA ([place])  . CARDIAC CATHETERIZATION  04/17/2011   Saphenous vein grafts to the  RCA 100% occluded, to the OM 100% occluded, and the diagonal 100% occluded. Internal mammary artery to the LAD, widely patent.  Marland Kitchen CATARACT EXTRACTION  10/10 and 02/11  . COLOSTOMY    . CORONARY ARTERY BYPASS GRAFT  02/09/02   X 4 ([place])  . DILATION AND CURETTAGE OF UTERUS  1971    miscarrage, abnormal  bleeding  . DILATION AND CURETTAGE OF UTERUS  1995   Menorrhagia  . HEMORRHOID SURGERY  09/11/04   Binding Hassell Done)  . PARTIAL COLECTOMY    . SHOULDER ARTHROSCOPY  05/04   Right, with rotator cuff debridement   Social History:  reports that she has never smoked. She has never used smokeless tobacco. She reports that she does not drink alcohol or use drugs.  Allergies  Allergen Reactions  . Actos [Pioglitazone] Other (See Comments)    edema  . Metformin And Related Other (See Comments)    Held due to renal function  . Zocor [Simvastatin] Other (See Comments)    Myalgias    Family History  Problem Relation Age of Onset  . Hypertension Mother   . Diabetes Mother   . Heart disease Mother   . Hypertension Father   . Heart disease Father 41       Heart attack  . Heart disease Brother        CHF  . Hypertension Brother   . COPD Brother        bronchial, frequent pneumonia  . Hypertension Brother   . Hyperlipidemia Brother   . Heart disease Brother        CABG  . Hypertension Brother   . Gout Brother   . Obesity Brother   . Diabetes Son   . Hypertension Son   . Breast cancer Maternal Aunt   . Colon cancer Neg Hx      Prior to Admission medications   Medication Sig Start Date End Date Taking? Authorizing Provider  albuterol (VENTOLIN HFA) 108 (90 Base) MCG/ACT inhaler Inhale 2 puffs into the lungs every 4 (four) hours as needed for wheezing or shortness of breath. 02/08/20   Martyn Ehrich, NP  amLODipine (NORVASC) 10 MG tablet TAKE ONE TABLET (10 MG) BY MOUTH DAILY. 08/11/19   Croitoru, Mihai, MD  aspirin EC 81 MG tablet Take 81 mg by mouth at bedtime.     [provider]  atorvastatin (LIPITOR) 80 MG tablet Take 1 tablet (80 mg total) by mouth daily at 6 PM. 05/18/19   Croitoru, Mihai, MD  BD INSULIN SYRINGE U/F 31G X 5/16" 0.3 ML MISC USE DAILY AND AS DIRECTED SLIDING SCALE. MULTIPLE INJECTIONS OF INSULIN PER DAY Patient not taking: Reported on 02/29/2020  01/16/19   Tonia Ghent, MD  benazepril (LOTENSIN) 20 MG tablet Take 1 tablet (20 mg total) by mouth daily. 08/24/19   Croitoru, Mihai, MD  Cholecalciferol (VITAMIN D) 1000 UNITS capsule Take 1,000 Units by mouth 2 (two) times daily.      [provider]  FLOVENT HFA 44 MCG/ACT inhaler TAKE 2 PUFFS BY MOUTH TWICE A DAY 12/28/19   Chesley Mires, MD  fluticasone (FLONASE) 50 MCG/ACT nasal spray Place 1 spray into both nostrils daily. 02/08/20   Martyn Ehrich, NP  insulin aspart (NOVOLOG FLEXPEN) 100 UNIT/ML FlexPen Inject 4-6 Units into the skin 3 (three) times daily with meals. 200-250 = 4 units, 251-300 = 5 units, 301-350 = 6 units    [provider]  insulin glargine (LANTUS SOLOSTAR) 100  UNIT/ML Solostar Pen Inject 15 Units into the skin at bedtime.    [provider]  meclizine (ANTIVERT) 25 MG tablet Take 1 tablet (25 mg total) by mouth daily as needed. For dizziness 01/03/15   Tonia Ghent, MD  nitroGLYCERIN (NITROSTAT) 0.4 MG SL tablet Place 1 tablet (0.4 mg total) under the tongue as needed for chest pain. As directed 01/19/19   Tonia Ghent, MD  [facility] ULTRA test strip USE TO CHECK BLOOD SUGAR 4 TIMES A DAY. INSULIN-DEPENDENT. DIAGNOSIS: E11.9 Patient not taking: Reported on 02/29/2020 11/13/19   Tonia Ghent, MD  OXYGEN Inhale 2-4 L/min into the lungs as needed (To keep O2 sats >90%.).    [provider]  Polyvinyl Alcohol-Povidone (REFRESH OP) Place 1 drop into both eyes daily as needed (for dry eyes).     [provider]  ranolazine (RANEXA) 500 MG 12 hr tablet Take 1 tablet (500 mg total) by mouth 2 (two) times daily. TAKE 1 TABLET BY MOUTH TWICE A DAY 08/24/19   Croitoru, Mihai, MD  sotalol (BETAPACE) 80 MG tablet TAKE 0.5 TABLETS (40 MG TOTAL) BY MOUTH 2 (TWO) TIMES DAILY. 08/25/19   Croitoru, Dani Gobble, MD   Physical Exam: Vitals:   02/14/2020 1945 02/29/2020 2000 02/04/2020 2015 02/12/2020 2030  BP: (!) 138/58 (!) 143/59 (!) 137/59 (!)  129/55  Pulse: 66 65 66 63  Resp: (!) 23 (!) 33 (!) 31 (!) 30  Temp:      TempSrc:      SpO2: 100% 97% 100% 98%  Weight:      Height:        Wt Readings from Last 3 Encounters:  02/12/2020 49.9 kg  02/24/20 50.2 kg  02/08/20 53.9 kg    . General:  Appears calm and comfortable but fatigued. Pale. AAOx4.  . Eyes: EOMI, normal lids, irises & conjunctiva . ENT: grossly normal hearing, lips & tongue . Neck: normal ROM . Cardiovascular: RRR, no m/r/g. No LE edema. Marland Kitchen Respiratory: Increased work of breathing. Diminished breath sounds in all fields. BiPAP in place. . Abdomen: soft, ntnd . Skin: no rash or induration seen on limited exam . Musculoskeletal: grossly normal tone BUE/BLE . Psychiatric: grossly normal mood and affect, speech fluent and appropriate; spoke few words due to BiPAP in place  . Neurologic: grossly non-focal.          Labs on Admission:  Basic Metabolic Panel: Recent Labs  Lab 02/21/2020 1854 02/10/2020 1900  NA 140 138  K 5.5* 5.1  CL 95*  --   CO2 36*  --   GLUCOSE 167*  --   BUN 41*  --   CREATININE 1.41*  --   CALCIUM 9.6  --   MG 2.1  --    Liver Function Tests: Recent Labs  Lab 02/21/2020 1854  AST 17  ALT 16  ALKPHOS 75  BILITOT 0.5  PROT 6.7  ALBUMIN 3.6   No results for input(s): LIPASE, AMYLASE in the last 168 hours. No results for input(s): AMMONIA in the last 168 hours. CBC: Recent Labs  Lab 02/14/2020 1854 02/09/2020 1900  WBC 19.9*  --   NEUTROABS 14.5*  --   HGB 9.5* 11.2*  HCT 32.3* 33.0*  MCV 98.5  --   PLT 343  --    Cardiac Enzymes: No results for input(s): CKTOTAL, CKMB, CKMBINDEX, TROPONINI in the last 168 hours.  BNP (last 3 results) Recent Labs    02/16/20 1814 02/19/20 0142  BNP 387.2* 437.0*    ProBNP (last 3 results) Recent Labs    02/06/20 1449  PROBNP 985*    CBG: Recent Labs  Lab 03/01/2020 1949  GLUCAP 146*    Radiological Exams on Admission: DG Chest Port 1 View  Result Date:  03/01/2020 CLINICAL DATA:  Shortness of breath EXAM: PORTABLE CHEST 1 VIEW COMPARISON:  Feb 19, 2020 FINDINGS: There is cardiomegaly with pulmonary venous hypertension. There are pleural effusions bilaterally with areas of interstitial pulmonary edema. There is airspace opacity in the left upper lobe. Much of the right upper lobe is obscured by the patient's mandible. Patient is status post coronary artery bypass grafting. There is aortic atherosclerosis. No bone lesions. No bone lesions. IMPRESSION: 1. Cardiomegaly with pulmonary vascular congestion. Pleural effusions bilaterally with interstitial edema. Appearance indicative of a degree of congestive heart failure. 2. Airspace opacity left upper lobe, likely pneumonia. Alveolar edema may be present as well in this area. Note that much of the patient's right upper lobe is obscured by the patient's mandible. 3.  Status post coronary artery bypass grafting. 4.  Aortic Atherosclerosis (ICD10-I70.0). Electronically Signed   By: Lowella Grip III M.D.   On: 02/25/2020 19:41    EKG: Pending to be done   Assessment/Plan Principal Problem:   Acute respiratory failure with hypoxia (HCC) Active Problems:   Type 2 diabetes mellitus with vascular disease (HCC)   Iron deficiency anemia   Essential hypertension   Atrial fibrillation (HCC)   Chronic kidney disease, stage III (moderate)   Hypercholesterolemia   Chronic diastolic heart failure (HCC)   Chronic respiratory failure with hypoxia and hypercapnia ([place])  85 y.o. female with PMH significant for afib, cardiomyopathy, chronic diastolic heart failure, CAD, HTN, HLD, asthma, restrictive lung disease with kyphoscoliosis, chronic respiratory failure, OSA, sleep related hypoxemia and Type 2 DM who presented to ED with SOB and admitted for acute on chronic respiratory failure presumed secondary to HCAP.  Acute on chronic respiratory failure with hypercarbia presumed secondarily to acute HCAP - Currently on  BiPAP at 15L with improvement in O2 sats - Elevated WBC and evidence of new PNA on CXR - Will obtain CTA Chest for better characterization of PNA - Cont Vanc/Cefepime - F/u Blood cx - Sputum cx ordered - Procal ordered - BNP ordered  - ABG ordered in AM to follow hypercapnia  - NPO except for sips with meds - Tele - Saturations goal is above 88% - DNI which was confirmed on admission - S/p IV Methylpred on admission; will not cont at this time  - Cont Albuterol and Flovent - Follows with Dr. Halford Chessman with Pulmonology; last seen by Pulm 5/6 - Will obtain second Trop to trend, first Trop 25 - Palliative Care consult placed at request of patient's son  Chronic Diastolic Heart Failure - appears euvolemic on exam, will hold IVF at this time - Last Echo 02/17/2020, EF 60-65% with Grade II diastolic dysfunction - Daily weights - I's and O's - BNP ordered  Chronic kidney disease stage III - Difficult to assess baseline as Cr similar to values during last admission; prior to last admission baseline ~ 1.0 - Trend - Possibly new baseline  Diabetes mellitus type 2 - Will hold home insulin as NPO - Sliding scale  - Last A1c 7.3 in April 2021; at goal   Essential hypertension - Cont home meds  HLD - Will hold home Lipitor as limiting PO meds with BiPAP  PAF - cont Sotalol - Tele -  Monitor K and Mag  Anemia - At baseline   Code Status: DNR/DNI. Declines chest compressions and intubation. Okay with NIV, pressors, ICU-level care. Confirmed with patient and her son on admission.  DVT Prophylaxis: Heparin as CrCl 24 Family Communication: Patient's son at bedside Disposition Plan: Admit to progressive care. Currently requiring NIV and IV medications. Patient is at high risk for further decompensation due to age and co-morbidities.    Time spent: 70 minutes  Chauncey Mann, MD [facility] Pager [phone]

## 2020-03-02 NOTE — ED Notes (Signed)
Pt transported to CT ?

## 2020-03-03 ENCOUNTER — Other Ambulatory Visit: Payer: Self-pay | Admitting: Primary Care

## 2020-03-03 DIAGNOSIS — I5033 Acute on chronic diastolic (congestive) heart failure: Secondary | ICD-10-CM

## 2020-03-03 DIAGNOSIS — E440 Moderate protein-calorie malnutrition: Secondary | ICD-10-CM

## 2020-03-03 DIAGNOSIS — Z515 Encounter for palliative care: Secondary | ICD-10-CM

## 2020-03-03 DIAGNOSIS — D508 Other iron deficiency anemias: Secondary | ICD-10-CM

## 2020-03-03 DIAGNOSIS — J984 Other disorders of lung: Secondary | ICD-10-CM

## 2020-03-03 DIAGNOSIS — I10 Essential (primary) hypertension: Secondary | ICD-10-CM

## 2020-03-03 DIAGNOSIS — I25709 Atherosclerosis of coronary artery bypass graft(s), unspecified, with unspecified angina pectoris: Secondary | ICD-10-CM

## 2020-03-03 DIAGNOSIS — J9622 Acute and chronic respiratory failure with hypercapnia: Secondary | ICD-10-CM

## 2020-03-03 DIAGNOSIS — Z66 Do not resuscitate: Secondary | ICD-10-CM

## 2020-03-03 DIAGNOSIS — R627 Adult failure to thrive: Secondary | ICD-10-CM

## 2020-03-03 DIAGNOSIS — N1832 Chronic kidney disease, stage 3b: Secondary | ICD-10-CM

## 2020-03-03 DIAGNOSIS — R5381 Other malaise: Secondary | ICD-10-CM

## 2020-03-03 DIAGNOSIS — M419 Scoliosis, unspecified: Secondary | ICD-10-CM

## 2020-03-03 DIAGNOSIS — I480 Paroxysmal atrial fibrillation: Secondary | ICD-10-CM

## 2020-03-03 DIAGNOSIS — G4733 Obstructive sleep apnea (adult) (pediatric): Secondary | ICD-10-CM

## 2020-03-03 DIAGNOSIS — J9621 Acute and chronic respiratory failure with hypoxia: Secondary | ICD-10-CM

## 2020-03-03 DIAGNOSIS — L8990 Pressure ulcer of unspecified site, unspecified stage: Secondary | ICD-10-CM

## 2020-03-03 DIAGNOSIS — Z789 Other specified health status: Secondary | ICD-10-CM

## 2020-03-03 DIAGNOSIS — E1159 Type 2 diabetes mellitus with other circulatory complications: Secondary | ICD-10-CM

## 2020-03-03 DIAGNOSIS — J45901 Unspecified asthma with (acute) exacerbation: Secondary | ICD-10-CM

## 2020-03-03 DIAGNOSIS — Z951 Presence of aortocoronary bypass graft: Secondary | ICD-10-CM

## 2020-03-03 DIAGNOSIS — Z933 Colostomy status: Secondary | ICD-10-CM

## 2020-03-03 LAB — URINALYSIS, ROUTINE W REFLEX MICROSCOPIC
Bacteria, UA: NONE SEEN
Bilirubin Urine: NEGATIVE
Glucose, UA: NEGATIVE mg/dL
Hgb urine dipstick: NEGATIVE
Ketones, ur: NEGATIVE mg/dL
Leukocytes,Ua: NEGATIVE
Nitrite: NEGATIVE
Protein, ur: 30 mg/dL — AB
Specific Gravity, Urine: 1.029 (ref 1.005–1.030)
pH: 5 (ref 5.0–8.0)

## 2020-03-03 LAB — COMPREHENSIVE METABOLIC PANEL
ALT: 17 U/L (ref 0–44)
AST: 17 U/L (ref 15–41)
Albumin: 3.6 g/dL (ref 3.5–5.0)
Alkaline Phosphatase: 69 U/L (ref 38–126)
Anion gap: 16 — ABNORMAL HIGH (ref 5–15)
BUN: 43 mg/dL — ABNORMAL HIGH (ref 8–23)
CO2: 32 mmol/L (ref 22–32)
Calcium: 9.8 mg/dL (ref 8.9–10.3)
Chloride: 93 mmol/L — ABNORMAL LOW (ref 98–111)
Creatinine, Ser: 1.43 mg/dL — ABNORMAL HIGH (ref 0.44–1.00)
GFR calc Af Amer: 39 mL/min — ABNORMAL LOW (ref >60)
GFR calc non Af Amer: 34 mL/min — ABNORMAL LOW (ref >60)
Glucose, Bld: 145 mg/dL — ABNORMAL HIGH (ref 70–99)
Potassium: 5.4 mmol/L — ABNORMAL HIGH (ref 3.5–5.1)
Sodium: 141 mmol/L (ref 135–145)
Total Bilirubin: 0.3 mg/dL (ref 0.3–1.2)
Total Protein: 6.7 g/dL (ref 6.5–8.1)

## 2020-03-03 LAB — CBC
HCT: 30.8 % — ABNORMAL LOW (ref 36.0–46.0)
Hemoglobin: 9.2 g/dL — ABNORMAL LOW (ref 12.0–15.0)
MCH: 29.3 pg (ref 26.0–34.0)
MCHC: 29.9 g/dL — ABNORMAL LOW (ref 30.0–36.0)
MCV: 98.1 fL (ref 80.0–100.0)
Platelets: 290 10*3/uL (ref 150–400)
RBC: 3.14 MIL/uL — ABNORMAL LOW (ref 3.87–5.11)
RDW: 15.5 % (ref 11.5–15.5)
WBC: 10 10*3/uL (ref 4.0–10.5)
nRBC: 0.6 % — ABNORMAL HIGH (ref 0.0–0.2)

## 2020-03-03 LAB — MRSA PCR SCREENING: MRSA by PCR: NEGATIVE

## 2020-03-03 LAB — GLUCOSE, CAPILLARY
Glucose-Capillary: 145 mg/dL — ABNORMAL HIGH (ref 70–99)
Glucose-Capillary: 132 mg/dL — ABNORMAL HIGH (ref 70–99)
Glucose-Capillary: 138 mg/dL — ABNORMAL HIGH (ref 70–99)

## 2020-03-03 LAB — SARS CORONAVIRUS 2 BY RT PCR (HOSPITAL ORDER, PERFORMED IN ~~LOC~~ HOSPITAL LAB): SARS Coronavirus 2: NEGATIVE

## 2020-03-03 LAB — LACTIC ACID, PLASMA: Lactic Acid, Venous: 1 mmol/L (ref 0.5–1.9)

## 2020-03-03 LAB — CBG MONITORING, ED: Glucose-Capillary: 188 mg/dL — ABNORMAL HIGH (ref 70–99)

## 2020-03-03 LAB — BRAIN NATRIURETIC PEPTIDE: B Natriuretic Peptide: 647.1 pg/mL — ABNORMAL HIGH (ref 0.0–100.0)

## 2020-03-03 MED ORDER — MORPHINE SULFATE 10 MG/5ML PO SOLN: 2.5000 mg | ORAL | Status: DC | PRN

## 2020-03-03 MED ORDER — ONDANSETRON 4 MG PO TBDP: 4.0000 mg | ORAL_TABLET | Freq: Four times a day (QID) | ORAL | Status: DC | PRN

## 2020-03-03 MED ORDER — LORAZEPAM 2 MG/ML IJ SOLN
1.0000 mg | INTRAMUSCULAR | Status: DC | PRN
  Administered 2020-03-05: 1 mg via INTRAVENOUS
  Filled 2020-03-03: qty 1

## 2020-03-03 MED ORDER — BUDESONIDE 0.25 MG/2ML IN SUSP
0.2500 mg | Freq: Two times a day (BID) | RESPIRATORY_TRACT | Status: DC
  Administered 2020-03-03: 0.25 mg via RESPIRATORY_TRACT
  Filled 2020-03-03: qty 2

## 2020-03-03 MED ORDER — ONDANSETRON HCL 4 MG/2ML IJ SOLN
4.0000 mg | Freq: Four times a day (QID) | INTRAMUSCULAR | Status: DC | PRN
  Administered 2020-03-04: 4 mg via INTRAVENOUS
  Filled 2020-03-03: qty 2

## 2020-03-03 MED ORDER — MORPHINE SULFATE (CONCENTRATE) 10 MG/0.5ML PO SOLN: 5.0000 mg | ORAL | Status: DC | PRN

## 2020-03-03 MED ORDER — LORAZEPAM 1 MG PO TABS: 1.0000 mg | ORAL_TABLET | ORAL | Status: DC | PRN

## 2020-03-03 MED ORDER — BIOTENE DRY MOUTH MT LIQD: 15.0000 mL | OROMUCOSAL | Status: DC | PRN

## 2020-03-03 MED ORDER — MORPHINE SULFATE (PF) 2 MG/ML IV SOLN
1.0000 mg | INTRAVENOUS | Status: DC | PRN
  Administered 2020-03-04 (×2): 1 mg via INTRAVENOUS
  Filled 2020-03-03 (×2): qty 1

## 2020-03-03 MED ORDER — GLYCOPYRROLATE 1 MG PO TABS
1.0000 mg | ORAL_TABLET | ORAL | Status: DC | PRN
  Filled 2020-03-03: qty 1

## 2020-03-03 MED ORDER — GLYCOPYRROLATE 0.2 MG/ML IJ SOLN: 0.2000 mg | INTRAMUSCULAR | Status: DC | PRN

## 2020-03-03 MED ORDER — HALOPERIDOL LACTATE 5 MG/ML IJ SOLN: 0.5000 mg | INTRAMUSCULAR | Status: DC | PRN

## 2020-03-03 MED ORDER — HALOPERIDOL LACTATE 2 MG/ML PO CONC
0.5000 mg | ORAL | Status: DC | PRN
  Filled 2020-03-03: qty 0.3

## 2020-03-03 MED ORDER — ORAL CARE MOUTH RINSE
15.0000 mL | Freq: Two times a day (BID) | OROMUCOSAL | Status: DC
  Administered 2020-03-03: 15 mL via OROMUCOSAL

## 2020-03-03 MED ORDER — HALOPERIDOL 0.5 MG PO TABS
0.5000 mg | ORAL_TABLET | ORAL | Status: DC | PRN
  Filled 2020-03-03: qty 1

## 2020-03-03 MED ORDER — LORAZEPAM 2 MG/ML PO CONC: 1.0000 mg | ORAL | Status: DC | PRN

## 2020-03-03 MED ORDER — CHLORHEXIDINE GLUCONATE 0.12 % MT SOLN
15.0000 mL | Freq: Two times a day (BID) | OROMUCOSAL | Status: DC
  Administered 2020-03-03: 15 mL via OROMUCOSAL
  Filled 2020-03-03: qty 15

## 2020-03-03 MED ORDER — POLYVINYL ALCOHOL 1.4 % OP SOLN
1.0000 [drp] | Freq: Four times a day (QID) | OPHTHALMIC | Status: DC | PRN
  Filled 2020-03-03: qty 15

## 2020-03-03 MED ORDER — ACETAMINOPHEN 650 MG RE SUPP: 650.0000 mg | Freq: Four times a day (QID) | RECTAL | Status: DC | PRN

## 2020-03-03 MED ORDER — ACETAMINOPHEN 325 MG PO TABS: 650.0000 mg | ORAL_TABLET | Freq: Four times a day (QID) | ORAL | Status: DC | PRN

## 2020-03-03 MED ORDER — ALBUTEROL SULFATE (2.5 MG/3ML) 0.083% IN NEBU: 2.5000 mg | INHALATION_SOLUTION | RESPIRATORY_TRACT | Status: DC | PRN

## 2020-03-03 MED ORDER — MORPHINE SULFATE (CONCENTRATE) 10 MG/0.5ML PO SOLN
5.0000 mg | ORAL | Status: DC | PRN
  Administered 2020-03-04: 5 mg via SUBLINGUAL
  Filled 2020-03-03: qty 0.5

## 2020-03-03 NOTE — Consult Note (Signed)
Responded to consult, conferred with nurse, pt doing better, to be transferred to 4N tomorrow. Pt asleep. Staff will page again if further chaplain services needed.  Rev. Eloise Levels Chaplain

## 2020-03-03 NOTE — Consult Note (Signed)
Consultation Note Date: 03/03/2020   Patient Name: [name]  DOB: 1934/11/18  MRN: [id number]  Age / Sex: 85 y.o., female  PCP: April Ghent, MD Referring Physician: Mercy Riding, MD  Reason for Consultation: Establishing goals of care  HPI/Patient Profile: 85 y.o. female  with past medical history per admission H & P of afib, cardiomyopathy, chronic diastolic heart failure, CAD, HTN, HLD, asthma, restrictive lung disease with kyphoscoliosis, chronic respiratory failure, OSA, sleep related hypoxemia and Type 2 DM who presented to ED with SOB and admitted for acute on chronic respiratory failure presumed secondary to HCAP on 02/26/2020. She had been discharged on 5/22 to rehab after a hospital stay for multifocal pneumonia that required intubation.  She confirmed DNR status in the ED last night  and was started on antibiotics and  placed on BiPAP with improvement in respiratory distress.   Clinical Assessment and Goals of Care: I have reviewed VS, medical records, including Epic notes, labs and imaging and assessed the patient.  I met with [name] at bedside to discuss diagnosis prognosis, [place], EOL wishes, disposition and options. as specialized medical care for people living with serious illness. It focuses on providing relief from the symptoms and stress of a serious illness. The goal is to improve quality of life for both the patient and the family. After meeting with --- to discuss our conversation, explain Palliative Medicine and provide support to her.  I asked [name] to tell me about herself. 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 She has a son and daughter. 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 She states her functional decline started after a fall in 12/11/10 with resultant pelvic fracture. She has been at [facility] for rehab since discharge on May 22nd. She states she cannot feed herself but she can try to use a washcloth if it  is given to her. She shares that she has been [employer], Electrical engineer and most recently [employer] in [place] following. She has had an ostomy for three years.   A detailed discussion was had today regarding advanced directives.  Concepts specific to code status, artifical feeding and hydration, continued IV antibiotics and rehospitalization was had.  The difference between non aggressive medical intervention path  and a palliative comfort care path for this patient at this time was had. Values and goals of care important to patient and family were attempted to be elicited.  Discussed the importance of continued conversation with family and their  medical providers regarding overall plan of care and treatment options, ensuring decisions are within the context of the patients values and GOCs.  I introduced Palliative medicine and hospice care. She states she feels everything is shutting down and she is dying and thought she was last night. Her son and daughter arrived at the end of our conversation and we reviewed patient wishes. Their father died in 12/11/10 at [facility] under comfort measures and this was a good experience for them. They want to focus on keeping their mother comfortable  and agree with her wishes.   She had a coughing episode while we were in the room and had difficulty getting secretions up. Suction was placed by staff and patient instructed in use. RT placed her back on BiPAP as her O2 Sats decreased to <80 with the congestion and coughing.   NEXT OF KIN Son [name] [phone] and Daughter [name] .    SUMMARY OF RECOMMENDATIONS  MOST completed and placed on paper chart and scanned into Media. Chaplain consult- for support Consult to Hospice in case she is able to be discharged for outpatient management.  Code Status/Advance Care Planning:  DNR   Symptom Management:   Dyspnea: add Morphine 2.5 mg PO/SL Q3H PRN  Albuterol Nebs Q4H PRN  Pulmoncort Neb 0.25  mg BID  Oxygen as needed  Palliative Prophylaxis:   Aspiration, Frequent Pain Assessment and Turn Reposition  Additional Recommendations (Limitations, Scope, Preferences):  Avoid Hospitalization, Full Comfort Care, Minimize Medications, No Artificial Feeding, No Hemodialysis, No IV Antibiotics and No IV Fluids  Psycho-social/Spiritual:   Desire for further Chaplaincy support:yes  Additional Recommendations: Caregiving  Support/Resources  Prognosis:   Hours - Days  Discharge Planning: Anticipated Hospital Death If discharged plan for Hospice. Plan discussed with [name].     Primary Diagnoses: Present on Admission: . Acute respiratory failure with hypoxia ([place]) . Type 2 diabetes mellitus with vascular disease ([place]) . Essential hypertension . Chronic kidney disease, stage III (moderate) . Chronic diastolic heart failure ([place]) . Chronic respiratory failure with hypoxia and hypercapnia (HCC) . Atrial fibrillation ([place]) . Hypercholesterolemia . Iron deficiency anemia   I have reviewed the medical record, interviewed the patient and family, and examined the patient. The following aspects are pertinent.  Past Medical History:  Diagnosis Date  . Anemia, iron deficiency   . Atrial fibrillation ([place]) 11/28/2010   2D Echo - EF-30-35, left atrium moderate to severely dilated, right ventricle moderately dilated, tricuspid valve mild-moderate regurgitation  . Cancer [facility])    R beast cancer followed by Dr. Hassell Farrell  . CAP (community acquired pneumonia)   . Chronic kidney disease (CKD), stage II (mild)   . Coronary artery disease   . Diabetes mellitus, type 2 ([place])   . Diverticulitis   . History of breast cancer    Followed yearly by [facility]  . Hyperlipemia   . Hypertension   . Nontraumatic retroperitoneal hematoma   . Osteoporosis   . Peripheral vascular disease ([place])    Social History   Socioeconomic History  . Marital status: Widowed    Spouse name: Not on file  .  Number of children: 2  . Years of education: Not on file  . Highest education level: Not on file  Occupational History  . Occupation: Retired  Tobacco Use  . Smoking status: Never Smoker  . Smokeless tobacco: Never Used  Substance and Sexual Activity  . Alcohol use: No    Alcohol/week: 0.0 standard drinks  . Drug use: No  . Sexual activity: Never  Other Topics Concern  . Not on file  Social History Narrative   Retired 1991 from [employer] work   Widowed as of 04/2011 after 50+ years   Lives alone   2 kids in [place]   Enjoys travelling and reading   Social Determinants of Radio broadcast assistant Strain:   . Difficulty of Paying Living Expenses:   Food Insecurity:   . Worried About Charity fundraiser in the Last Year:   .  Ran Out of Food in the Last Year:   Transportation Needs:   . Film/video editor (Medical):   Marland Kitchen Lack of Transportation (Non-Medical):   Physical Activity:   . Days of Exercise per Week:   . Minutes of Exercise per Session:   Stress:   . Feeling of Stress :   Social Connections:   . Frequency of Communication with Friends and Family:   . Frequency of Social Gatherings with Friends and Family:   . Attends Religious Services:   . Active Member of Clubs or Organizations:   . Attends Archivist Meetings:   Marland Kitchen Marital Status:    Family History  Problem Relation Age of Onset  . Hypertension Mother   . Diabetes Mother   . Heart disease Mother   . Hypertension Father   . Heart disease Father 75       Heart attack  . Heart disease Brother        CHF  . Hypertension Brother   . COPD Brother        bronchial, frequent pneumonia  . Hypertension Brother   . Hyperlipidemia Brother   . Heart disease Brother        CABG  . Hypertension Brother   . Gout Brother   . Obesity Brother   . Diabetes Son   . Hypertension Son   . Breast cancer Maternal Aunt   . Colon cancer Neg Hx    Scheduled Meds: . amLODipine  10 mg Oral Daily   . benazepril  20 mg Oral Daily  . budesonide (PULMICORT) nebulizer solution  0.25 mg Nebulization BID  . chlorhexidine  15 mL Mouth Rinse BID  . heparin  5,000 Units Subcutaneous Q8H  . insulin aspart  0-9 Units Subcutaneous Q4H  . mouth rinse  15 mL Mouth Rinse q12n4p  . sotalol  40 mg Oral BID   Continuous Infusions: . ceFEPime (MAXIPIME) IV    . vancomycin     PRN Meds:.albuterol    Allergies  Allergen Reactions  . Actos [Pioglitazone] Other (See Comments)    edema  . Metformin And Related Other (See Comments)    Held due to renal function  . Zocor [Simvastatin] Other (See Comments)    Myalgias    Vital Signs: BP (!) 122/58   Pulse 70   Temp 97.8 F (36.6 C) (Oral)   Resp 16   Ht 5' 2"  (1.575 m)   Wt 53.6 kg Comment: BED ZEROED  SpO2 93%   BMI 21.61 kg/m  Pain Scale: 0-10   Pain Score: 0-No pain c/o discomfort in sacral area.   SpO2: SpO2: 93 % O2 Device:SpO2: 93 % O2 Flow Rate: .O2 Flow Rate (L/min): 4 L/min  IO: Intake/output summary: No intake or output data in the 24 hours ending 03/03/20 1202  LBM:   Baseline Weight: Weight: 49.9 kg Most recent weight: Weight: 53.6 kg(BED ZEROED)     Palliative Assessment/Data:30%     Time In: 9:30 Time Out: 10:40 Time Total: 70 minutes Greater than 50%  of this time was spent counseling and coordinating care related to the above assessment and plan.  Signed by: Donalynn Furlong, NP   Please contact Palliative Medicine Team phone at [phone] for questions and concerns.  For individual provider: See Shea Evans

## 2020-03-03 NOTE — Progress Notes (Signed)
Patient lying in bed comfortably, in no apparent respiratory distress.  Removed BiPap from patient at this time, and placed on 4l [place].  Patient tolerated well.

## 2020-03-03 NOTE — Progress Notes (Signed)
Patient removed from [place] and placed on 6l [place].  Patient felt as though she was not getting enough "air".  Placed patient on NRB for her comfort.  Will continue to monitor.

## 2020-03-03 NOTE — Progress Notes (Signed)
Pt arrived to 4E13 from ED. Bipap in place and pt tolerating well- sats high 90s . VSS. Tele verified x2. CHG bath done. Pt alert to voice. Attempted to reach son Louie Casa, left voicemail. Pt oriented to room and call light. Will continue to monitor.

## 2020-03-03 NOTE — Progress Notes (Signed)
PROGRESS NOTE  [name] YQI:[id number] DOB: [birth year]   PCP: Tonia Ghent, MD  Patient is from: Home  DOA: 03/01/2020 LOS: 1  Brief Narrative / Interim history: 85 year old female with history of A. fib, cardiomyopathy, diastolic CHF, pulmonary HTN, CAD/CABG, restrictive lung disease, OSA, asthma/chronic respiratory failure on oxygen, HTN, HLD and kyphoscoliosis brought to ED with shortness of breath and hypoxia.  Patient was recently discharged from rehab on 5/22 after hospital stay for multifocal pneumonia during which she was intubated.  She has had diminished appetite and some cognitive decline since last hospitalization.  Prior to presentation to ED, she was found to saturate in the 50s of oxygen but improved with 4 L by nasal cannula.  She was put on nonrebreather by EMS and brought to ED.  No report of headache, dizziness, fever, chills, cough, chest pain, abdominal pain, nausea, vomiting, diarrhea, constipation, dysuria, hematuria, hematochezia, melena, difficulty moving arms/legs, speech difficulty or any other complaints.  In ED, slightly hypertensive and tachypneic.  Saturating at 87% but recovered to 100 on 15 L voice nonrebreather.  WBC 20 with left shift.  Hgb 9.5.  LA 0.8.  Pro-Cal 0.12.  K5.5. Cr 1.41.  BUN 41.  Trop 25.  EKG NSR without acute ischemic finding.  VBG 7.29/85/52/41 suggestive for acute on chronic respiratory acidosis.  COVID-19 negative.  CXR concerning for CHF and LUL opacity.  CTA chest negative for PE but bilateral upper lobe pneumonia.  Started on broad-spectrum antibiotics, and admitted for acute on chronic respiratory failure due to "HCAP".  Palliative care consulted as requested by patient's son.  The next day, palliative care met with patient and patient family at bedside, and she was transitioned to full comfort care.  Per patient and family, patient's husband died in 2012-[place] under comfort measures, and it has been her wish to have smooth  passage like him.   Subjective: She reported some shortness of breath and a little bit of "stinging" on her back.  She is oriented to self, place and situation.   Objective: Vitals:   03/03/20 0853 03/03/20 0944 03/03/20 1121 03/03/20 1223  BP: (!) 122/52 (!) 122/58  (!) 108/49  Pulse: 76  70 66  Resp: _0 Temp: 97.8 F (36.6 C)   (!) 100.7 F (38.2 C)  TempSrc: Oral   Axillary  SpO2: 93%  93% 96%  Weight:      Height:       No intake or output data in the 24 hours ending 03/03/20 1306 Filed Weights   02/11/2020 1817 03/03/20 0354  Weight: 49.9 kg 53.6 kg    Examination:  GENERAL: Frail and chronically ill-appearing. HEENT: MMM.  Vision and hearing grossly intact.  NECK: Supple.  No apparent JVD.  RESP: On BiPAP.  No IWOB.  Bilateral excursion. CVS:  RRR. Heart sounds normal.  ABD/GI/GU: BS+. Abd soft, NTND.  Ostomy bag over LLQ. MSK/EXT:  Moves extremities.  Significant muscle mass wasting. SKIN: no apparent skin lesion or wound NEURO: Awake and oriented to self, place and situation.  No apparent focal neuro deficit. PSYCH: Calm. Normal affect.  Procedures:  None  Microbiology summarized: COVID-19 PCR negative. Blood cultures NGTD.  Assessment & Plan: End-of-life care/full comfort care/DNR/DNI -End-of-life pathway with as needed Roxanol, IV morphine, Ativan, glycopyrrolate, Zofran...  Acute on chronic respiratory failure with hypoxia and hypercarbia Multifocal pneumonia  Acute on chronic diastolic CHF  History of systolic CHF with recovered EF Acute asthma exacerbation Moderate  to severe pulmonary hypertension Obstructive sleep apnea Restrictive lung disease with kyphoscoliosis -Discontinued antibiotics -End-of-life care as above.  CAD status post CABG and stents  CKD-3B  Uncontrolled DM-2 with hyperglycemia  Paroxysmal A. fib   Iron deficiency anemia  Essential hypertension   Hyperlipidemia   Debility/physical deconditioning    Moderate malnutrition/failure to thrive   Colostomy status       Pressure Injury 03/03/20 Sacrum Mid;Posterior Stage 1 -  Intact skin with non-blanchable redness of a localized area usually over a bony prominence. (Active)  03/03/20 0420  Location: Sacrum  Location Orientation: Mid;Posterior  Staging: Stage 1 -  Intact skin with non-blanchable redness of a localized area usually over a bony prominence.  Wound Description (Comments):   Present on Admission: Yes   DVT prophylaxis: Not indicated Code Status: DNR/DNI Family Communication: Updated patient's son and daughter-in-law at bedside.  Remains inpatient appropriate because:Anticipating in hospital death   Dispo: The patient is from: Home              Anticipated d/c is to: Anticipating in hospital death.               Patient currently is not medically stable to d/c.        Consultants:  Palliative care medicine   Sch Meds:  Scheduled Meds: . amLODipine  10 mg Oral Daily  . benazepril  20 mg Oral Daily  . budesonide (PULMICORT) nebulizer solution  0.25 mg Nebulization BID  . chlorhexidine  15 mL Mouth Rinse BID  . sotalol  40 mg Oral BID   Continuous Infusions: PRN Meds:.albuterol, morphine  Antimicrobials: Anti-infectives (From admission, onward)   Start     Dose/Rate Route Frequency Ordered Stop   03/03/20 2100  vancomycin (VANCOREADY) IVPB 500 mg/100 mL  Status:  Discontinued     500 mg 100 mL/hr over 60 Minutes Intravenous Every 24 hours 02/04/2020 2053 03/03/20 1245   03/03/20 2030  ceFEPIme (MAXIPIME) 2 g in sodium chloride 0.9 % 100 mL IVPB  Status:  Discontinued     2 g 200 mL/hr over 30 Minutes Intravenous Every 24 hours 02/15/2020 2053 03/03/20 1245   02/13/2020 2015  vancomycin (VANCOCIN) IVPB 1000 mg/200 mL premix     1,000 mg 200 mL/hr over 60 Minutes Intravenous  Once 02/06/2020 2001 02/07/2020 2328   02/08/2020 2015  ceFEPIme (MAXIPIME) 2 g in sodium chloride 0.9 % 100 mL IVPB     2 g 200 mL/hr  over 30 Minutes Intravenous  Once 02/29/2020 2001 02/03/2020 2124       I have personally reviewed the following labs and images: CBC: Recent Labs  Lab 03/03/2020 1854 02/07/2020 1900 03/03/20 0725  WBC 19.9*  --  10.0  NEUTROABS 14.5*  --   --   HGB 9.5* 11.2* 9.2*  HCT 32.3* 33.0* 30.8*  MCV 98.5  --  98.1  PLT 343  --  290   BMP &GFR Recent Labs  Lab 02/14/2020 1854 02/20/2020 1900 03/03/20 0500  NA 140 138 141  K 5.5* 5.1 5.4*  CL 95*  --  93*  CO2 36*  --  32  GLUCOSE 167*  --  145*  BUN 41*  --  43*  CREATININE 1.41*  --  1.43*  CALCIUM 9.6  --  9.8  MG 2.1  --   --    Estimated Creatinine Clearance: 24 mL/min (A) (by C-G formula based on SCr of 1.43 mg/dL (H)). Liver & Pancreas: Recent Labs  Lab 02/24/2020 1854 03/03/20 0500  AST 17 17  ALT 16 17  ALKPHOS 75 69  BILITOT 0.5 0.3  PROT 6.7 6.7  ALBUMIN 3.6 3.6   No results for input(s): LIPASE, AMYLASE in the last 168 hours. No results for input(s): AMMONIA in the last 168 hours. Diabetic: No results for input(s): HGBA1C in the last 72 hours. Recent Labs  Lab 02/09/2020 1949 03/03/20 0157 03/03/20 0851 03/03/20 1128  GLUCAP 146* 188* 145* 132*   Cardiac Enzymes: No results for input(s): CKTOTAL, CKMB, CKMBINDEX, TROPONINI in the last 168 hours. Recent Labs    02/06/20 1449  PROBNP 985*   Coagulation Profile: Recent Labs  Lab 02/22/2020 1854  INR 1.0   Thyroid Function Tests: No results for input(s): TSH, T4TOTAL, FREET4, T3FREE, THYROIDAB in the last 72 hours. Lipid Profile: No results for input(s): CHOL, HDL, LDLCALC, TRIG, CHOLHDL, LDLDIRECT in the last 72 hours. Anemia Panel: No results for input(s): VITAMINB12, FOLATE, FERRITIN, TIBC, IRON, RETICCTPCT in the last 72 hours. Urine analysis:    Component Value Date/Time   COLORURINE AMBER (A) 03/03/2020 0236   APPEARANCEUR HAZY (A) 03/03/2020 0236   LABSPEC 1.029 03/03/2020 0236   PHURINE 5.0 03/03/2020 0236   GLUCOSEU NEGATIVE 03/03/2020 0236    HGBUR NEGATIVE 03/03/2020 0236   HGBUR small 11/27/2010 1056   BILIRUBINUR NEGATIVE 03/03/2020 0236   KETONESUR NEGATIVE 03/03/2020 0236   PROTEINUR 30 (A) 03/03/2020 0236   UROBILINOGEN 1.0 10/25/2014 1754   NITRITE NEGATIVE 03/03/2020 0236   LEUKOCYTESUR NEGATIVE 03/03/2020 0236   Sepsis Labs: Invalid input(s): PROCALCITONIN, [place]  Microbiology: Recent Results (from the past 240 hour(s))  SARS Coronavirus 2 by RT PCR (hospital order, performed in [facility] hospital lab) Nasopharyngeal Nasopharyngeal Swab     Status: None   Collection Time: 02/23/20  4:39 PM   Specimen: Nasopharyngeal Swab  Result Value Ref Range Status   SARS Coronavirus 2 NEGATIVE NEGATIVE Final    Comment: (NOTE) SARS-CoV-2 target nucleic acids are NOT DETECTED. The SARS-CoV-2 RNA is generally detectable in upper and lower respiratory specimens during the acute phase of infection. The lowest concentration of SARS-CoV-2 viral copies this assay can detect is 250 copies / mL. A negative result does not preclude SARS-CoV-2 infection and should not be used as the sole basis for treatment or other patient management decisions.  A negative result may occur with improper specimen collection / handling, submission of specimen other than nasopharyngeal swab, presence of viral mutation(s) within the areas targeted by this assay, and inadequate number of viral copies (<250 copies / mL). A negative result must be combined with clinical observations, patient history, and epidemiological information. Fact Sheet for Patients:   StrictlyIdeas.no Fact Sheet for Healthcare Providers: BankingDealers.co.za This test is not yet approved or cleared  by the [place] FDA and has been authorized for detection and/or diagnosis of SARS-CoV-2 by FDA under an Emergency Use Authorization (EUA).  This EUA will remain in effect (meaning this test can be used) for the duration  of the COVID-19 declaration under Section 564(b)(1) of the Act, 21 U.S.C. section 360bbb-3(b)(1), unless the authorization is terminated or revoked sooner. Performed at [facility], [place] [address], [place], [place] [address]   Blood Culture (routine x 2)     Status: None (Preliminary result)   Collection Time: 03/04/2020  6:54 PM   Specimen: BLOOD  Result Value Ref Range Status   Specimen Description BLOOD BLOOD RIGHT HAND  Final   Special Requests  Final    BOTTLES DRAWN AEROBIC AND ANAEROBIC Blood Culture adequate volume   Culture   Final    NO GROWTH < 12 HOURS Performed at [facility], [place] [address] [place], [place] [address]    Report Status PENDING  Incomplete  Blood Culture (routine x 2)     Status: None (Preliminary result)   Collection Time: 02/06/2020  6:54 PM   Specimen: BLOOD  Result Value Ref Range Status   Specimen Description BLOOD BLOOD LEFT HAND  Final   Special Requests   Final    BOTTLES DRAWN AEROBIC AND ANAEROBIC Blood Culture adequate volume   Culture   Final    NO GROWTH < 12 HOURS Performed at [facility], [place] [address], [place], [place] [address]    Report Status PENDING  Incomplete  SARS Coronavirus 2 by RT PCR (hospital order, performed in [facility] hospital lab) Nasopharyngeal Nasopharyngeal Swab     Status: None   Collection Time: 02/07/2020 11:37 PM   Specimen: Nasopharyngeal Swab  Result Value Ref Range Status   SARS Coronavirus 2 NEGATIVE NEGATIVE Final    Comment: (NOTE) SARS-CoV-2 target nucleic acids are NOT DETECTED. The SARS-CoV-2 RNA is generally detectable in upper and lower respiratory specimens during the acute phase of infection. The lowest concentration of SARS-CoV-2 viral copies this assay can detect is 250 copies / mL. A negative result does not preclude SARS-CoV-2 infection and should not be used as the sole basis for treatment or other patient management decisions.  A negative result may occur  with improper specimen collection / handling, submission of specimen other than nasopharyngeal swab, presence of viral mutation(s) within the areas targeted by this assay, and inadequate number of viral copies (<250 copies / mL). A negative result must be combined with clinical observations, patient history, and epidemiological information. Fact Sheet for Patients:   StrictlyIdeas.no Fact Sheet for Healthcare Providers: BankingDealers.co.za This test is not yet approved or cleared  by the [place] FDA and has been authorized for detection and/or diagnosis of SARS-CoV-2 by FDA under an Emergency Use Authorization (EUA).  This EUA will remain in effect (meaning this test can be used) for the duration of the COVID-19 declaration under Section 564(b)(1) of the Act, 21 U.S.C. section 360bbb-3(b)(1), unless the authorization is terminated or revoked sooner. Performed at [facility], [place] [address] [place], [place] [address]     Radiology Studies: CT Angio Chest PE W and/or Wo Contrast  Result Date: 02/24/2020 CLINICAL DATA:  Shortness of breath. EXAM: CT ANGIOGRAPHY CHEST WITH CONTRAST TECHNIQUE: Multidetector CT imaging of the chest was performed using the standard protocol during bolus administration of intravenous contrast. Multiplanar CT image reconstructions and MIPs were obtained to evaluate the vascular anatomy. CONTRAST:  75m OMNIPAQUE IOHEXOL 350 MG/ML SOLN COMPARISON:  Chest x-ray Mar 02, 2020.  CT of the chest Feb 05, 2017. FINDINGS: Cardiovascular: The thoracic aorta demonstrates atherosclerotic change. The thoracic aorta is nonaneurysmal without dissection. The main pulmonary artery is normal in caliber. No pulmonary emboli. Cardiomegaly. Coronary artery calcifications. Mediastinum/Nodes: Bilateral pleural effusions, right greater than left. No pericardial effusion. The thyroid is unremarkable. Adenopathy. A precarinal node  measures 12 mm in short axis on series 5, image 46. A node anterior to the carina on series 5, image 56 measures 14 mm in short axis. Prominent nodes are seen in the base of neck is well bilaterally such as on series 5, image 30 measuring 8 mm  in short axis on the right and 10 mm on the left. No chest wall abnormalities are identified. Lungs/Pleura: Trachea and mainstem bronchi are well aerated. There is opacification of the right lower lobe bronchus. No pneumothorax. Patchy nodular opacities in the right apex are identified. Infiltrate left upper lobe. Bilateral pleural effusions are seen, right greater than left. Opacities underlie the effusions in the bases bilaterally. Interlobular septal thickening consistent with edema. Scattered ground-glass in the apices suggesting edema. Upper Abdomen: No acute abnormality. Musculoskeletal: No chest wall abnormality. No acute or significant osseous findings. Review of the MIP images confirms the above findings. IMPRESSION: 1. No pulmonary emboli. 2. Bilateral pleural effusions. Opacities under the effusions may represent atelectasis or infiltrate such as pneumonia or aspiration. Patchy opacities in the upper lobes bilaterally consistent multifocal pneumonia. Recommend short-term follow-up imaging to ensure resolution. 3. Superimposed pulmonary edema. 4. Adenopathy in the mediastinum may be reactive. Recommend short-term follow-up imaging after treatment of the patient's pneumonia to ensure resolution. 5. No other acute abnormalities. Aortic Atherosclerosis (ICD10-I70.0). Electronically Signed   By: Dorise Bullion III M.D   On: 02/04/2020 21:33   DG Chest Port 1 View  Result Date: 02/09/2020 CLINICAL DATA:  Shortness of breath EXAM: PORTABLE CHEST 1 VIEW COMPARISON:  Feb 19, 2020 FINDINGS: There is cardiomegaly with pulmonary venous hypertension. There are pleural effusions bilaterally with areas of interstitial pulmonary edema. There is airspace opacity in the left  upper lobe. Much of the right upper lobe is obscured by the patient's mandible. Patient is status post coronary artery bypass grafting. There is aortic atherosclerosis. No bone lesions. No bone lesions. IMPRESSION: 1. Cardiomegaly with pulmonary vascular congestion. Pleural effusions bilaterally with interstitial edema. Appearance indicative of a degree of congestive heart failure. 2. Airspace opacity left upper lobe, likely pneumonia. Alveolar edema may be present as well in this area. Note that much of the patient's right upper lobe is obscured by the patient's mandible. 3.  Status post coronary artery bypass grafting. 4.  Aortic Atherosclerosis (ICD10-I70.0). Electronically Signed   By: Lowella Grip III M.D.   On: 02/12/2020 19:41      Levina Boyack T. Jolly  If 7PM-7AM, please contact night-coverage [email or web address] Password [email or web address] 03/03/2020, 1:06 PM

## 2020-03-03 NOTE — ED Notes (Signed)
RN called RT to assist with pt transport.

## 2020-03-03 NOTE — ED Notes (Signed)
This RN called Floor RN, Colletta [place] to inform her we are waiting for RT to help transport pt.

## 2020-03-04 DIAGNOSIS — I5032 Chronic diastolic (congestive) heart failure: Secondary | ICD-10-CM

## 2020-03-04 DIAGNOSIS — Z515 Encounter for palliative care: Secondary | ICD-10-CM

## 2020-03-04 DIAGNOSIS — J9621 Acute and chronic respiratory failure with hypoxia: Secondary | ICD-10-CM

## 2020-03-04 DIAGNOSIS — J9601 Acute respiratory failure with hypoxia: Secondary | ICD-10-CM

## 2020-03-04 MED ORDER — GLYCOPYRROLATE 1 MG PO TABS
1.0000 mg | ORAL_TABLET | Freq: Three times a day (TID) | ORAL | 0 refills | Status: AC | PRN
Start: 2020-03-04 — End: ?

## 2020-03-04 MED ORDER — WHITE PETROLATUM EX OINT
TOPICAL_OINTMENT | CUTANEOUS | Status: DC | PRN
  Filled 2020-03-04: qty 28.35

## 2020-03-04 MED ORDER — LORAZEPAM 0.5 MG PO TABS
0.5000 mg | ORAL_TABLET | ORAL | 0 refills | Status: AC | PRN
Start: 2020-03-04 — End: ?

## 2020-03-04 MED ORDER — MORPHINE SULFATE (CONCENTRATE) 10 MG/0.5ML PO SOLN
5.0000 mg | Freq: Four times a day (QID) | ORAL | Status: DC
  Administered 2020-03-04 (×2): 5 mg via ORAL
  Filled 2020-03-04 (×3): qty 0.5

## 2020-03-04 MED ORDER — ONDANSETRON HCL 4 MG PO TABS: 4.0000 mg | ORAL_TABLET | Freq: Every day | ORAL | 1 refills | Status: AC | PRN

## 2020-03-04 MED ORDER — MORPHINE SULFATE (CONCENTRATE) 10 MG /0.5 ML PO SOLN: 10.0000 mg | ORAL | 0 refills | Status: AC | PRN

## 2020-03-04 NOTE — Progress Notes (Signed)
Daily Progress Note   Patient Name: [name]       Date: 03/04/2020 DOB: 02-02-1935  Age: 85 y.o. MRN#: [id number] Attending Physician: Mercy Riding, MD Primary Care Physician: Tonia Ghent, MD Admit Date: 02/23/2020  Reason for Consultation/Follow-up: To discuss complex medical decision making related to patient's goals of care  Subjective: Met at bedside with patient and son.  Discussed patient's decisions regarding choosing end of life care vs pushing to continue living.  Patient expressed that she wants things to be as easy as possible on her children.  Louie Casa (son) talked with his mother for a few minutes about her decisions and condition.  After their discussion both seemed satisfied that the correct decisions had been made.    Shortly we were joined by the patient's daughter Venida Jarvis and DIL Jeani Hawking.  Questions were asked and answered about the patient's condition.  We discussed that at some point the 7L of oxygen Mrs. Leach is currently on will become burdensome and unnecessary and will be weaned down as her comfort medications are turned up.  Family is comfortable with this.  We discussed whether or not to transfer to hospice house.  Family felt strongly that loading her onto an ambulance and transporting her to [facility] was not desirable.  We decided to move patient to 6N.  Family is very concerned about the patient becoming dyspneic and struggling to breathe.  We decided to schedule roxanol q 6 hours to prevent dyspnea in addition to her PRN morphine.  Assessment:  Acute on chronic respiratory failure with hypoxia and hypercarbia, refractory multifocal pneumonia, CHF.  Patient nearing EOL.  No further aggressive interventions desired.    Patient Profile/HPI:  85 y.o. female  with  past medical history per admission H & P of afib, cardiomyopathy, chronic diastolic heart failure,CAD,HTN,HLD,asthma, restrictive lung disease with kyphoscoliosis, chronic respiratory failure, OSA, sleep related hypoxemia and Type 2 DM who presented to ED with SOB and admitted for acute on chronic respiratory failure presumed secondary to HCAP on 03/04/2020. She had been discharged on 5/22 to rehab after a hospital stay for multifocal pneumonia that required intubation.  She confirmed DNR status in the ED last night  and was started on antibiotics and  placed on BiPAP with improvement in respiratory distress.    Length  of Stay: 2   Vital Signs: BP (!) 120/48 (BP Location: Left Arm)   Pulse (!) 56   Temp 97.6 F (36.4 C) (Oral)   Resp 12   Ht _0  (1.575 m)   Wt 53.6 kg Comment: BED ZEROED  SpO2 (!) 78%   BMI 21.61 kg/m  SpO2: SpO2: (!) 78 % O2 Device: O2 Device: High Flow Nasal Cannula O2 Flow Rate: O2 Flow Rate (L/min): 7 L/min       Palliative Assessment/Data: 20%     Palliative Care Plan    Recommendations/Plan:  Comfort Measures Only.  Discontinue all tele and pulse ox.  All interventions not directed towards comfort have been discontinued.  Comfort medications ordered  Transfer to 6N  Patient too fragile to transfer out of the hospital.  Family expecting oxygen to be weaned down as patient becomes more comfortable and progresses towards death.  Per RN patient's oxygen went from 14 to 7L today.  Code Status:  DNR  Prognosis:   Hours - Days given advanced heart and lung disease with refractory pneumonia.  Discharge Planning:  Anticipated Hospital Death  Care plan was discussed with MD, RN, Family, my colleague Wadie Lessen, NP.  Thank you for allowing the Palliative Medicine Team to assist in the care of this patient.  Total time spent:  60 min.     Greater than 50%  of this time was spent counseling and coordinating care related to the above  assessment and plan.  Florentina Jenny, PA-C Palliative Medicine  Please contact Palliative MedicineTeam phone at [phone] for questions and concerns between 7 am - 7 pm.   Please see AMION for individual provider pager numbers.

## 2020-03-04 NOTE — Progress Notes (Signed)
PROGRESS NOTE  [name]:[id number] DOB: [birth year]   PCP: Tonia Ghent, MD  Patient is from: Home  DOA: 02/09/2020 LOS: 2  Brief Narrative / Interim history: 85 year old female with history of A. fib, cardiomyopathy, diastolic CHF, pulmonary HTN, CAD/CABG, restrictive lung disease, OSA, asthma/chronic respiratory failure on oxygen, HTN, HLD and kyphoscoliosis brought to ED with shortness of breath and hypoxia.  Patient was recently discharged from rehab on 5/22 after hospital stay for multifocal pneumonia during which she was intubated.  She has had diminished appetite and some cognitive decline since last hospitalization.  Prior to presentation to ED, she was found to saturate in the 50s of oxygen but improved with 4 L by nasal cannula.  She was put on nonrebreather by EMS and brought to ED.  No report of headache, dizziness, fever, chills, cough, chest pain, abdominal pain, nausea, vomiting, diarrhea, constipation, dysuria, hematuria, hematochezia, melena, difficulty moving arms/legs, speech difficulty or any other complaints.  In ED, slightly hypertensive and tachypneic.  Saturating at 87% but recovered to 100 on 15 L voice nonrebreather.  WBC 20 with left shift.  Hgb 9.5.  LA 0.8.  Pro-Cal 0.12.  K5.5. Cr 1.41.  BUN 41.  Trop 25.  EKG NSR without acute ischemic finding.  VBG 7.29/85/52/41 suggestive for acute on chronic respiratory acidosis.  COVID-19 negative.  CXR concerning for CHF and LUL opacity.  CTA chest negative for PE but bilateral upper lobe pneumonia.  Started on broad-spectrum antibiotics, and admitted for acute on chronic respiratory failure due to "HCAP".  Palliative care consulted as requested by patient's son.  The next day, palliative care met with patient and patient family at bedside, and she was transitioned to full comfort care.   Subjective: Seen and examined earlier this morning.  No major events overnight of this morning.  No complaints.  Denies pain or  shortness of breath although she seems to have some increased work of breathing.   Objective: Vitals:   03/04/20 1055 03/04/20 1242 03/04/20 1254 03/04/20 1522  BP:      Pulse:      Resp:      Temp:      TempSrc:      SpO2: (!) 88% (!) 80% (!) 78% (!) 83%  Weight:      Height:        Intake/Output Summary (Last 24 hours) at 03/04/2020 1824 Last data filed at 03/04/2020 0845 Gross per 24 hour  Intake --  Output 200 ml  Net -200 ml   Filed Weights   02/05/2020 1817 03/03/20 0354  Weight: 49.9 kg 53.6 kg    Examination:  GENERAL: Frail and chronically ill-appearing. HEENT: MMM.  Vision and hearing grossly intact.  NECK: Supple.  No apparent JVD.  RESP: On 8 L by [place].  Some increased work of breathing. CVS: Hemodynamically stable. ABD/GI/GU: Colostomy over LLQ. MSK/EXT:  Moves extremities. No apparent deformity. No edema.  SKIN: no apparent skin lesion or wound NEURO: Awake and oriented to self, place and situation.  No apparent focal neuro deficit. PSYCH: Calm. Normal affect.  Procedures:  None  Microbiology summarized: COVID-19 PCR negative. Blood cultures NGTD.  Assessment & Plan: End-of-life care/full comfort care/DNR/DNI -End-of-life pathway with as needed Roxanol, IV morphine, Ativan, glycopyrrolate, Zofran. -Weaning off oxygen and escalating comfort care per PMT in anticipation for in-hospital death  Acute on chronic respiratory failure with hypoxia and hypercarbia Multifocal pneumonia  Acute on chronic diastolic CHF  History of systolic CHF with  recovered EF Acute asthma exacerbation Moderate to severe pulmonary hypertension Obstructive sleep apnea Restrictive lung disease with kyphoscoliosis -Discontinued antibiotics -End-of-life care as above.  CAD status post CABG and stents  CKD-3B  Uncontrolled DM-2 with hyperglycemia  Paroxysmal A. fib   Iron deficiency anemia  Essential hypertension   Hyperlipidemia   Debility/physical deconditioning    Moderate malnutrition/failure to thrive   Colostomy status       Pressure Injury 03/03/20 Sacrum Mid;Posterior Stage 1 -  Intact skin with non-blanchable redness of a localized area usually over a bony prominence. (Active)  03/03/20 0420  Location: Sacrum  Location Orientation: Mid;Posterior  Staging: Stage 1 -  Intact skin with non-blanchable redness of a localized area usually over a bony prominence.  Wound Description (Comments):   Present on Admission: Yes   DVT prophylaxis: Not indicated Code Status: DNR/DNI Family Communication: Updated patient's son, daughter and and daughter-in-law in person.  Remains inpatient appropriate because:Anticipating in hospital death   Dispo: The patient is from: Home              Anticipated d/c is to: Anticipating in hospital death.               Patient currently is not medically stable to d/c.        Consultants:  Palliative care medicine   Sch Meds:  Scheduled Meds: . morphine CONCENTRATE  5 mg Oral Q6H   Continuous Infusions: PRN Meds:.acetaminophen **OR** acetaminophen, antiseptic oral rinse, glycopyrrolate **OR** glycopyrrolate **OR** glycopyrrolate, haloperidol **OR** haloperidol **OR** haloperidol lactate, LORazepam **OR** LORazepam **OR** LORazepam, morphine injection, morphine CONCENTRATE **OR** morphine CONCENTRATE, ondansetron **OR** ondansetron (ZOFRAN) IV, polyvinyl alcohol, white petrolatum  Antimicrobials: Anti-infectives (From admission, onward)   Start     Dose/Rate Route Frequency Ordered Stop   03/03/20 2100  vancomycin (VANCOREADY) IVPB 500 mg/100 mL  Status:  Discontinued     500 mg 100 mL/hr over 60 Minutes Intravenous Every 24 hours 02/19/2020 2053 03/03/20 1245   03/03/20 2030  ceFEPIme (MAXIPIME) 2 g in sodium chloride 0.9 % 100 mL IVPB  Status:  Discontinued     2 g 200 mL/hr over 30 Minutes Intravenous Every 24 hours 02/11/2020 2053 03/03/20 1245   02/05/2020 2015  vancomycin (VANCOCIN) IVPB 1000 mg/200  mL premix     1,000 mg 200 mL/hr over 60 Minutes Intravenous  Once 02/10/2020 2001 02/12/2020 2328   02/23/2020 2015  ceFEPIme (MAXIPIME) 2 g in sodium chloride 0.9 % 100 mL IVPB     2 g 200 mL/hr over 30 Minutes Intravenous  Once 02/16/2020 2001 02/11/2020 2124       I have personally reviewed the following labs and images: CBC: Recent Labs  Lab 02/13/2020 1854 03/01/2020 1900 03/03/20 0725  WBC 19.9*  --  10.0  NEUTROABS 14.5*  --   --   HGB 9.5* 11.2* 9.2*  HCT 32.3* 33.0* 30.8*  MCV 98.5  --  98.1  PLT 343  --  290   BMP &GFR Recent Labs  Lab 02/23/2020 1854 02/28/2020 1900 03/03/20 0500  NA 140 138 141  K 5.5* 5.1 5.4*  CL 95*  --  93*  CO2 36*  --  32  GLUCOSE 167*  --  145*  BUN 41*  --  43*  CREATININE 1.41*  --  1.43*  CALCIUM 9.6  --  9.8  MG 2.1  --   --    Estimated Creatinine Clearance: 24 mL/min (A) (by C-G formula based on SCr  of 1.43 mg/dL (H)). Liver & Pancreas: Recent Labs  Lab 02/08/2020 1854 03/03/20 0500  AST 17 17  ALT 16 17  ALKPHOS 75 69  BILITOT 0.5 0.3  PROT 6.7 6.7  ALBUMIN 3.6 3.6   No results for input(s): LIPASE, AMYLASE in the last 168 hours. No results for input(s): AMMONIA in the last 168 hours. Diabetic: No results for input(s): HGBA1C in the last 72 hours. Recent Labs  Lab 02/19/2020 1949 03/03/20 0157 03/03/20 0851 03/03/20 1128 03/03/20 1631  GLUCAP 146* 188* 145* 132* 138*   Cardiac Enzymes: No results for input(s): CKTOTAL, CKMB, CKMBINDEX, TROPONINI in the last 168 hours. Recent Labs    02/06/20 1449  PROBNP 985*   Coagulation Profile: Recent Labs  Lab 02/18/2020 1854  INR 1.0   Thyroid Function Tests: No results for input(s): TSH, T4TOTAL, FREET4, T3FREE, THYROIDAB in the last 72 hours. Lipid Profile: No results for input(s): CHOL, HDL, LDLCALC, TRIG, CHOLHDL, LDLDIRECT in the last 72 hours. Anemia Panel: No results for input(s): VITAMINB12, FOLATE, FERRITIN, TIBC, IRON, RETICCTPCT in the last 72 hours. Urine  analysis:    Component Value Date/Time   COLORURINE AMBER (A) 03/03/2020 0236   APPEARANCEUR HAZY (A) 03/03/2020 0236   LABSPEC 1.029 03/03/2020 0236   PHURINE 5.0 03/03/2020 0236   GLUCOSEU NEGATIVE 03/03/2020 0236   HGBUR NEGATIVE 03/03/2020 0236   HGBUR small 11/27/2010 1056   BILIRUBINUR NEGATIVE 03/03/2020 0236   KETONESUR NEGATIVE 03/03/2020 0236   PROTEINUR 30 (A) 03/03/2020 0236   UROBILINOGEN 1.0 10/25/2014 1754   NITRITE NEGATIVE 03/03/2020 0236   LEUKOCYTESUR NEGATIVE 03/03/2020 0236   Sepsis Labs: Invalid input(s): PROCALCITONIN, [place]  Microbiology: Recent Results (from the past 240 hour(s))  Blood Culture (routine x 2)     Status: None (Preliminary result)   Collection Time: 02/29/2020  6:54 PM   Specimen: BLOOD  Result Value Ref Range Status   Specimen Description BLOOD BLOOD RIGHT HAND  Final   Special Requests   Final    BOTTLES DRAWN AEROBIC AND ANAEROBIC Blood Culture adequate volume   Culture   Final    NO GROWTH 2 DAYS Performed at [facility], [address], [place], [place] [address]    Report Status PENDING  Incomplete  Blood Culture (routine x 2)     Status: None (Preliminary result)   Collection Time: 02/28/2020  6:54 PM   Specimen: BLOOD  Result Value Ref Range Status   Specimen Description BLOOD BLOOD LEFT HAND  Final   Special Requests   Final    BOTTLES DRAWN AEROBIC AND ANAEROBIC Blood Culture adequate volume   Culture   Final    NO GROWTH 2 DAYS Performed at [facility], [place] [address] [place], [place] [address]    Report Status PENDING  Incomplete  SARS Coronavirus 2 by RT PCR (hospital order, performed in [facility] hospital lab) Nasopharyngeal Nasopharyngeal Swab     Status: None   Collection Time: 02/21/2020 11:37 PM   Specimen: Nasopharyngeal Swab  Result Value Ref Range Status   SARS Coronavirus 2 NEGATIVE NEGATIVE Final    Comment: (NOTE) SARS-CoV-2 target nucleic acids are NOT DETECTED. The SARS-CoV-2 RNA is  generally detectable in upper and lower respiratory specimens during the acute phase of infection. The lowest concentration of SARS-CoV-2 viral copies this assay can detect is 250 copies / mL. A negative result does not preclude SARS-CoV-2 infection and should not be used as the sole basis for treatment  or other patient management decisions.  A negative result may occur with improper specimen collection / handling, submission of specimen other than nasopharyngeal swab, presence of viral mutation(s) within the areas targeted by this assay, and inadequate number of viral copies (<250 copies / mL). A negative result must be combined with clinical observations, patient history, and epidemiological information. Fact Sheet for Patients:   StrictlyIdeas.no Fact Sheet for Healthcare Providers: BankingDealers.co.za This test is not yet approved or cleared  by the [place] FDA and has been authorized for detection and/or diagnosis of SARS-CoV-2 by FDA under an Emergency Use Authorization (EUA).  This EUA will remain in effect (meaning this test can be used) for the duration of the COVID-19 declaration under Section 564(b)(1) of the Act, 21 U.S.C. section 360bbb-3(b)(1), unless the authorization is terminated or revoked sooner. Performed at [facility], [place] [address], [place], [place] [address]   Urine culture     Status: None (Preliminary result)   Collection Time: 03/03/20  3:36 AM   Specimen: In/Out Cath Urine  Result Value Ref Range Status   Specimen Description IN/OUT CATH URINE  Final   Special Requests NONE  Final   Culture   Final    CULTURE REINCUBATED FOR BETTER GROWTH Performed at [facility], [place] [address], [place], [place] [address]    Report Status PENDING  Incomplete  MRSA PCR Screening     Status: None   Collection Time: 03/03/20 11:34 AM   Specimen: Nasopharyngeal  Result Value Ref Range Status   MRSA by  PCR NEGATIVE NEGATIVE Final    Comment:        The GeneXpert MRSA Assay (FDA approved for NASAL specimens only), is one component of a comprehensive MRSA colonization surveillance program. It is not intended to diagnose MRSA infection nor to guide or monitor treatment for MRSA infections. Performed at [facility], [place] [address], [place], [place] [address]     Radiology Studies: No results found.    Dupree Givler T. [place]  If 7PM-7AM, please contact night-coverage [email or web address] Password [facility] 03/04/2020, 6:24 PM

## 2020-03-05 ENCOUNTER — Telehealth: Payer: Self-pay | Admitting: Family Medicine

## 2020-03-05 LAB — URINE CULTURE: Culture: 4000 — AB

## 2020-03-05 LAB — GLUCOSE, CAPILLARY: Glucose-Capillary: 188 mg/dL — ABNORMAL HIGH (ref 70–99)

## 2020-03-05 DEATH — deceased

## 2020-03-07 LAB — CULTURE, BLOOD (ROUTINE X 2)
Culture: NO GROWTH
Culture: NO GROWTH
Special Requests: ADEQUATE
Special Requests: ADEQUATE

## 2020-04-03 NOTE — Telephone Encounter (Signed)
This encounter was created in error - please disregard.

## 2020-04-04 NOTE — Telephone Encounter (Signed)
Called her son to offer condolences.  He thanked me for the call.  I was always glad to see this gracious and kind lady in clinic.  She was a determined person who didn't complain about her illnesses or troubles.  She is (and will be) missed.

## 2020-04-04 NOTE — Death Summary Note (Signed)
DEATH SUMMARY   Patient Details  Name: [name] MRN: [id number] DOB: Mar 25, 1935  Admission/Discharge Information   Admit Date:  22-Mar-2020  Date of Death: Date of Death: 03-25-2020  Time of Death: Time of Death: 0320  Length of Stay: 3  Referring Physician: Tonia Ghent, MD   Reason(s) for Hospitalization  Acute on Chronic Respiratory Failure - Multifocal PNA  Diagnoses  Preliminary cause of death:  Secondary Diagnoses (including complications and co-morbidities):  Principal Problem:   Acute respiratory failure with hypoxia ([facility]) Active Problems:   Type 2 diabetes mellitus with vascular disease ([facility])   Iron deficiency anemia   Essential hypertension   Atrial fibrillation (HCC)   Chronic kidney disease, stage III (moderate)   Hypercholesterolemia   Chronic diastolic heart failure (HCC)   Chronic respiratory failure with hypoxia and hypercapnia (HCC)   Pressure injury of skin   Acute on chronic respiratory failure with hypoxia and hypercapnia (HCC)   Comfort measures only status   Palliative care encounter   Brief Hospital Course (including significant findings, care, treatment, and services provided and events leading to death)  [name] is a 85 y.o. year old female with history of A. fib, cardiomyopathy, diastolic CHF, pulmonary HTN, CAD/CABG, restrictive lung disease, OSA, asthma/chronic respiratory failure on oxygen, HTN, HLD and kyphoscoliosis brought to ED with shortness of breath and hypoxia. Patient was recently discharged from rehab on 5/22 after hospital stay for multifocal pneumonia during which she was intubated.  She has had diminished appetite and some cognitive decline since last hospitalization. Prior to presentation to ED, she was found to saturate in the 50s of oxygen but improved with 4 L by nasal cannula.  She was put on nonrebreather by EMS and brought to ED. Patient was diagnosed with multifocal pneumonia and started on abx. Per family request,  Palliative Care consulted and it was decided to pursue comfort care. Patient expired Mar 26, 2023.  Pertinent Labs and Studies  Significant Diagnostic Studies DG Chest 2 View  Result Date: 02/16/2020 CLINICAL DATA:  Shortness of breath EXAM: CHEST - 2 VIEW COMPARISON:  02/06/2020 FINDINGS: Cardiomegaly. Prior CABG. Patchy bilateral airspace opacities have progressed since prior study. Possible small effusions. No acute bony abnormality. IMPRESSION: Patchy bilateral airspace disease, worsening since prior study could reflect edema or infection. Small bilateral effusions. Electronically Signed   By: Rolm Baptise M.D.   On: 02/16/2020 19:37   DG Chest 2 View  Result Date: 02/07/2020 CLINICAL DATA:  Shortness of breath and fatigue for 3 weeks. Atrial fibrillation. Coronary artery disease. EXAM: CHEST - 2 VIEW COMPARISON:  01/19/2017 FINDINGS: Heart size remains within normal limits. Prior CABG again noted. Aortic atherosclerosis incidentally noted. Pleural-parenchymal scarring is again seen in both lower lungs, however there is no evidence of acute infiltrate or pleural effusion. IMPRESSION: Stable bilateral pleural-parenchymal scarring. No active cardiopulmonary disease. Electronically Signed   By: Marlaine Hind M.D.   On: 02/07/2020 08:53   DG Abd 1 View  Result Date: 02/22/2020 CLINICAL DATA:  Abdominal distension EXAM: ABDOMEN - 1 VIEW COMPARISON:  02/19/2019 FINDINGS: Scattered large and small bowel gas is noted. No abnormal mass is seen. Calcifications are noted in the right mid abdomen consistent with cholelithiasis. Heavy vascular calcifications are noted. Left common iliac artery stent is seen. IMPRESSION: Cholelithiasis. No acute abnormality noted. Electronically Signed   By: Inez Catalina M.D.   On: 02/22/2020 14:29   CT HEAD WO CONTRAST  Result Date: 02/19/2020 CLINICAL DATA:  Altered mental  status. EXAM: CT HEAD WITHOUT CONTRAST TECHNIQUE: Contiguous axial images were obtained from the base of the  skull through the vertex without intravenous contrast. COMPARISON:  July 14, 2016. FINDINGS: Brain: Mild chronic ischemic white matter disease is noted. No mass effect or midline shift is noted. Ventricular size is within normal limits. There is no evidence of mass lesion, hemorrhage or acute infarction. Vascular: No hyperdense vessel or unexpected calcification. Skull: Normal. Negative for fracture or focal lesion. Sinuses/Orbits: No acute finding. Other: None. IMPRESSION: Mild chronic ischemic white matter disease. No acute intracranial abnormality seen. Electronically Signed   By: Marijo Conception M.D.   On: 02/19/2020 14:53   CT Angio Chest PE W and/or Wo Contrast  Result Date: 02/21/2020 CLINICAL DATA:  Shortness of breath. EXAM: CT ANGIOGRAPHY CHEST WITH CONTRAST TECHNIQUE: Multidetector CT imaging of the chest was performed using the standard protocol during bolus administration of intravenous contrast. Multiplanar CT image reconstructions and MIPs were obtained to evaluate the vascular anatomy. CONTRAST:  39mL OMNIPAQUE IOHEXOL 350 MG/ML SOLN COMPARISON:  Chest x-ray Mar 02, 2020.  CT of the chest Feb 05, 2017. FINDINGS: Cardiovascular: The thoracic aorta demonstrates atherosclerotic change. The thoracic aorta is nonaneurysmal without dissection. The main pulmonary artery is normal in caliber. No pulmonary emboli. Cardiomegaly. Coronary artery calcifications. Mediastinum/Nodes: Bilateral pleural effusions, right greater than left. No pericardial effusion. The thyroid is unremarkable. Adenopathy. A precarinal node measures 12 mm in short axis on series 5, image 46. A node anterior to the carina on series 5, image 56 measures 14 mm in short axis. Prominent nodes are seen in the base of neck is well bilaterally such as on series 5, image 30 measuring 8 mm in short axis on the right and 10 mm on the left. No chest wall abnormalities are identified. Lungs/Pleura: Trachea and mainstem bronchi are well  aerated. There is opacification of the right lower lobe bronchus. No pneumothorax. Patchy nodular opacities in the right apex are identified. Infiltrate left upper lobe. Bilateral pleural effusions are seen, right greater than left. Opacities underlie the effusions in the bases bilaterally. Interlobular septal thickening consistent with edema. Scattered ground-glass in the apices suggesting edema. Upper Abdomen: No acute abnormality. Musculoskeletal: No chest wall abnormality. No acute or significant osseous findings. Review of the MIP images confirms the above findings. IMPRESSION: 1. No pulmonary emboli. 2. Bilateral pleural effusions. Opacities under the effusions may represent atelectasis or infiltrate such as pneumonia or aspiration. Patchy opacities in the upper lobes bilaterally consistent multifocal pneumonia. Recommend short-term follow-up imaging to ensure resolution. 3. Superimposed pulmonary edema. 4. Adenopathy in the mediastinum may be reactive. Recommend short-term follow-up imaging after treatment of the patient's pneumonia to ensure resolution. 5. No other acute abnormalities. Aortic Atherosclerosis (ICD10-I70.0). Electronically Signed   By: Dorise Bullion III M.D   On: 03/04/2020 21:33   DG Chest Port 1 View  Result Date: 02/24/2020 CLINICAL DATA:  Shortness of breath EXAM: PORTABLE CHEST 1 VIEW COMPARISON:  Feb 19, 2020 FINDINGS: There is cardiomegaly with pulmonary venous hypertension. There are pleural effusions bilaterally with areas of interstitial pulmonary edema. There is airspace opacity in the left upper lobe. Much of the right upper lobe is obscured by the patient's mandible. Patient is status post coronary artery bypass grafting. There is aortic atherosclerosis. No bone lesions. No bone lesions. IMPRESSION: 1. Cardiomegaly with pulmonary vascular congestion. Pleural effusions bilaterally with interstitial edema. Appearance indicative of a degree of congestive heart failure. 2.  Airspace opacity left upper  lobe, likely pneumonia. Alveolar edema may be present as well in this area. Note that much of the patient's right upper lobe is obscured by the patient's mandible. 3.  Status post coronary artery bypass grafting. 4.  Aortic Atherosclerosis (ICD10-I70.0). Electronically Signed   By: Lowella Grip III M.D.   On: 02/20/2020 19:41   DG CHEST PORT 1 VIEW  Result Date: 02/19/2020 CLINICAL DATA:  Hypoxemia EXAM: PORTABLE CHEST 1 VIEW COMPARISON:  02/16/2020 FINDINGS: Interval intubation, tip of the endotracheal tube is about 1.7 cm superior to the carina. Esophageal tube tip overlies the proximal stomach. Post sternotomy changes. Small bilateral pleural effusions. Increased bilateral interstitial and ground-glass opacities with patchy lung consolidations. Enlarged cardiomediastinal silhouette with aortic atherosclerosis. No pneumothorax. Multiple old bilateral rib deformities. IMPRESSION: 1. Endotracheal tube tip about 1.7 cm superior to carina. 2. Small bilateral effusions with interval worsening of diffuse bilateral interstitial and ground-glass opacity with patchy lung consolidations 3. Cardiomegaly Electronically Signed   By: Donavan Foil M.D.   On: 02/19/2020 01:39   DG Abd Portable 1V  Result Date: 02/19/2020 CLINICAL DATA:  NG tube placement EXAM: PORTABLE ABDOMEN - 1 VIEW COMPARISON:  03/04/2011, 02/16/2020 FINDINGS: Bibasilar pleural effusions and airspace disease. Lower sternotomy changes. Esophageal tube tip and side-port project over the proximal stomach. Faint calcifications in the right upper quadrant likely reflecting gallstones. Left iliac stent. Upper gas pattern nonobstructed with moderate stool. Questionable appearance of intramural air within left upper quadrant bowel loops. IMPRESSION: 1. Esophageal tube tip overlies the proximal stomach 2. Questionable appearance of intramural air involving left upper quadrant small bowel loops, radiographic follow-up is  suggested. Electronically Signed   By: Donavan Foil M.D.   On: 02/19/2020 01:37   ECHOCARDIOGRAM COMPLETE  Result Date: 02/17/2020    ECHOCARDIOGRAM REPORT   Patient Name:   KATRIANNA GORKA Date of Exam: 02/17/2020 Medical Rec #:  [id number]    Height:       62.0 in Accession #:    [id number]   Weight:       118.8 lb Date of Birth:  [birth year]   BSA:          1.532 m Patient Age:    21 years     BP:           137/58 mmHg Patient Gender: F            HR:           78 bpm. Exam Location:  Inpatient Procedure: 2D Echo, Cardiac Doppler, Color Doppler and Intracardiac            Opacification Agent Indications:    CHF-Acute Diastolic 0000000  History:        Patient has prior history of Echocardiogram examinations, most                 recent 01/31/2016. CHF, Arrythmias:Atrial Fibrillation; Risk                 Factors:Diabetes, Hypertension and Dyslipidemia. CKD.  Sonographer:    Clayton Lefort RDCS (AE) Referring Phys: [place]  1. Left ventricular ejection fraction, by estimation, is 60 to 65%. The left ventricle has normal function. The left ventricle has no regional wall motion abnormalities. There is moderate left ventricular hypertrophy. Left ventricular diastolic parameters are consistent with Grade II diastolic dysfunction (pseudonormalization). There is the interventricular septum is flattened in systole and diastole, consistent with right ventricular pressure and volume overload.  2.  Right ventricular systolic function is mildly reduced. The right ventricular size is mildly enlarged. There is severely elevated pulmonary artery systolic pressure. The estimated right ventricular systolic pressure is [id number] mmHg.  3. Left atrial size was moderately dilated.  4. The mitral valve is grossly normal. Mild mitral valve regurgitation.  5. Tricuspid valve regurgitation is mild to moderate.  6. The aortic valve is tricuspid. Aortic valve regurgitation is not visualized. Mild aortic valve sclerosis is  present, with no evidence of aortic valve stenosis.  7. The inferior vena cava is normal in size with <50% respiratory variability, suggesting right atrial pressure of 8 mmHg. FINDINGS  Left Ventricle: Left ventricular ejection fraction, by estimation, is 60 to 65%. The left ventricle has normal function. The left ventricle has no regional wall motion abnormalities. Definity contrast agent was given IV to delineate the left ventricular  endocardial borders. The left ventricular internal cavity size was normal in size. There is moderate left ventricular hypertrophy. The interventricular septum is flattened in systole and diastole, consistent with right ventricular pressure and volume overload. Left ventricular diastolic parameters are consistent with Grade II diastolic dysfunction (pseudonormalization). Right Ventricle: The right ventricular size is mildly enlarged. No increase in right ventricular wall thickness. Right ventricular systolic function is mildly reduced. There is severely elevated pulmonary artery systolic pressure. The tricuspid regurgitant velocity is 3.62 m/s, and with an assumed right atrial pressure of 8 mmHg, the estimated right ventricular systolic pressure is [id number] mmHg. Left Atrium: Left atrial size was moderately dilated. Right Atrium: Right atrial size was normal in size. Pericardium: There is no evidence of pericardial effusion. Mitral Valve: The mitral valve is grossly normal. There is mild thickening of the mitral valve leaflet(s). Mild mitral valve regurgitation. MV peak gradient, 13.5 mmHg. The mean mitral valve gradient is 3.0 mmHg. Tricuspid Valve: The tricuspid valve is grossly normal. Tricuspid valve regurgitation is mild to moderate. Aortic Valve: The aortic valve is tricuspid. Aortic valve regurgitation is not visualized. Mild aortic valve sclerosis is present, with no evidence of aortic valve stenosis. Mild aortic valve annular calcification. Aortic valve mean gradient measures 3.0   mmHg. Aortic valve peak gradient measures 6.6 mmHg. Aortic valve area, by VTI measures 2.41 cm. Pulmonic Valve: The pulmonic valve was grossly normal. Pulmonic valve regurgitation is not visualized. Aorta: The aortic root is normal in size and structure. Venous: The inferior vena cava is normal in size with less than 50% respiratory variability, suggesting right atrial pressure of 8 mmHg. IAS/Shunts: No atrial level shunt detected by color flow Doppler.  LEFT VENTRICLE PLAX 2D LVIDd:         2.60 cm LVIDs:         1.70 cm LV PW:         1.60 cm LV IVS:        1.50 cm LVOT diam:     2.00 cm LV SV:         78 LV SV Index:   51 LVOT Area:     3.14 cm  RIGHT VENTRICLE             IVC RV Basal diam:  2.80 cm     IVC diam: 1.80 cm RV S prime:     10.60 cm/s TAPSE (M-mode): 1.5 cm LEFT ATRIUM             Index       RIGHT ATRIUM           Index LA diam:  3.20 cm 2.09 cm/m  RA Area:     15.60 cm LA Vol (A2C):   58.5 ml 38.18 ml/m RA Volume:   37.30 ml  24.35 ml/m LA Vol (A4C):   74.4 ml 48.56 ml/m LA Biplane Vol: 66.4 ml 43.34 ml/m  AORTIC VALVE AV Area (Vmax):    2.60 cm AV Area (Vmean):   2.61 cm AV Area (VTI):     2.41 cm AV Vmax:           128.00 cm/s AV Vmean:          85.900 cm/s AV VTI:            0.323 m AV Peak Grad:      6.6 mmHg AV Mean Grad:      3.0 mmHg LVOT Vmax:         106.00 cm/s LVOT Vmean:        71.500 cm/s LVOT VTI:          0.248 m LVOT/AV VTI ratio: 0.77  AORTA Ao Root diam: 3.20 cm MITRAL VALVE             TRICUSPID VALVE MV Area (PHT): 3.93 cm  TR Peak grad:   52.4 mmHg MV Peak grad:  13.5 mmHg TR Vmax:        362.00 cm/s MV Mean grad:  3.0 mmHg MV Vmax:       1.84 m/s  SHUNTS MV Vmean:      70.7 cm/s Systemic VTI:  0.25 m                          Systemic Diam: 2.00 cm Rozann Lesches MD Electronically signed by Rozann Lesches MD Signature Date/Time: 02/17/2020/12:27:17 PM    Final     Microbiology Recent Results (from the past 240 hour(s))  Blood Culture (routine x 2)      Status: None (Preliminary result)   Collection Time: 02/26/2020  6:54 PM   Specimen: BLOOD  Result Value Ref Range Status   Specimen Description BLOOD BLOOD RIGHT HAND  Final   Special Requests   Final    BOTTLES DRAWN AEROBIC AND ANAEROBIC Blood Culture adequate volume   Culture   Final    NO GROWTH 2 DAYS Performed at [facility], [address] [place], [place] [address]    Report Status PENDING  Incomplete  Blood Culture (routine x 2)     Status: None (Preliminary result)   Collection Time: 02/16/2020  6:54 PM   Specimen: BLOOD  Result Value Ref Range Status   Specimen Description BLOOD BLOOD LEFT HAND  Final   Special Requests   Final    BOTTLES DRAWN AEROBIC AND ANAEROBIC Blood Culture adequate volume   Culture   Final    NO GROWTH 2 DAYS Performed at [facility], [place] [address], [place], [place] [address]    Report Status PENDING  Incomplete  SARS Coronavirus 2 by RT PCR (hospital order, performed in [facility] hospital lab) Nasopharyngeal Nasopharyngeal Swab     Status: None   Collection Time: 02/06/2020 11:37 PM   Specimen: Nasopharyngeal Swab  Result Value Ref Range Status   SARS Coronavirus 2 NEGATIVE NEGATIVE Final    Comment: (NOTE) SARS-CoV-2 target nucleic acids are NOT DETECTED. The SARS-CoV-2 RNA is generally detectable in upper and lower respiratory specimens during the acute phase of infection. The lowest concentration of SARS-CoV-2 viral copies this assay can  detect is 250 copies / mL. A negative result does not preclude SARS-CoV-2 infection and should not be used as the sole basis for treatment or other patient management decisions.  A negative result may occur with improper specimen collection / handling, submission of specimen other than nasopharyngeal swab, presence of viral mutation(s) within the areas targeted by this assay, and inadequate number of viral copies (<250 copies / mL). A negative result must be combined with  clinical observations, patient history, and epidemiological information. Fact Sheet for Patients:   StrictlyIdeas.no Fact Sheet for Healthcare Providers: BankingDealers.co.za This test is not yet approved or cleared  by the [place] FDA and has been authorized for detection and/or diagnosis of SARS-CoV-2 by FDA under an Emergency Use Authorization (EUA).  This EUA will remain in effect (meaning this test can be used) for the duration of the COVID-19 declaration under Section 564(b)(1) of the Act, 21 U.S.C. section 360bbb-3(b)(1), unless the authorization is terminated or revoked sooner. Performed at [facility], [place] [address], [place], [place] [address]   Urine culture     Status: None (Preliminary result)   Collection Time: 03/03/20  3:36 AM   Specimen: In/Out Cath Urine  Result Value Ref Range Status   Specimen Description IN/OUT CATH URINE  Final   Special Requests NONE  Final   Culture   Final    CULTURE REINCUBATED FOR BETTER GROWTH Performed at [facility], [place] [address], [place], [place] [address]    Report Status PENDING  Incomplete  MRSA PCR Screening     Status: None   Collection Time: 03/03/20 11:34 AM   Specimen: Nasopharyngeal  Result Value Ref Range Status   MRSA by PCR NEGATIVE NEGATIVE Final    Comment:        The GeneXpert MRSA Assay (FDA approved for NASAL specimens only), is one component of a comprehensive MRSA colonization surveillance program. It is not intended to diagnose MRSA infection nor to guide or monitor treatment for MRSA infections. Performed at [facility], [place] [address] [place], [place] [address]     Lab Basic Metabolic Panel: Recent Labs  Lab 02/26/2020 1854 02/23/2020 1900 03/03/20 0500  NA 140 138 141  K 5.5* 5.1 5.4*  CL 95*  --  93*  CO2 36*  --  32  GLUCOSE 167*  --  145*  BUN 41*  --  43*  CREATININE 1.41*  --  1.43*  CALCIUM 9.6  --  9.8  MG  2.1  --   --    Liver Function Tests: Recent Labs  Lab 03/03/2020 1854 03/03/20 0500  AST 17 17  ALT 16 17  ALKPHOS 75 69  BILITOT 0.5 0.3  PROT 6.7 6.7  ALBUMIN 3.6 3.6   No results for input(s): LIPASE, AMYLASE in the last 168 hours. No results for input(s): AMMONIA in the last 168 hours. CBC: Recent Labs  Lab 02/23/2020 1854 02/17/2020 1900 03/03/20 0725  WBC 19.9*  --  10.0  NEUTROABS 14.5*  --   --   HGB 9.5* 11.2* 9.2*  HCT 32.3* 33.0* 30.8*  MCV 98.5  --  98.1  PLT 343  --  290   Cardiac Enzymes: No results for input(s): CKTOTAL, CKMB, CKMBINDEX, TROPONINI in the last 168 hours. Sepsis Labs: Recent Labs  Lab 02/19/2020 1854 03/03/20 0725  PROCALCITON 0.12  --   WBC 19.9* 10.0  LATICACIDVEN 0.8 1.0    Procedures/Operations  None  Laurette Villescas N Yusuke Beza 03/26/20, 3:44 AM

## 2020-04-04 NOTE — Progress Notes (Signed)
Patient expired at [facility], death pronounced by Vena Rua RN and Dionne Bucy RN, patient's son at bedside, C. Fair MD notified via amion.

## 2020-04-04 NOTE — Progress Notes (Signed)
  Adult Brain Death Determination Link to Official Policy  Time of Examination: March 19, 2020 3:39 AM  A. No Evidence of /Cause of Reversible CNS Depression  1. Core temperature must be greater >36 degrees. Last temp: Temp: 97.6 F (36.4 C) (Note: If unable to achieve normothermia after 12 hours of temperature management, may consider proceeding with Brain Death Evaluation.):    yes  2. No evidence of severe metabolic perturbations that could potentate CNS depression. Consider glucose, Na, creatinine, PaCO2, SaO2.: no  3. No evidence of drugs, by history or measurement, that could potentiate central nervous system depression: narcotics, ethanol, benzodiazepines, barbiturates, neuromuscular blockade.:     no  B. Absence of Cortical Function  1. GCS = 3:    yes  C. Absence of Brain Stem Reflexes and Responses  1. Pupils light-fixed    yes  2. Absent corneal reflexes:    yes  3. Absent response to upper and lower airway stimulation, such as pharyngeal and endotracheal suctioning.:    yes  4. Absent ocular response to head turning (no eye movement).    yes  Printed Name: Chauncey Mann, MD 03-19-2020 3:39 AM

## 2020-04-04 DEATH — deceased

## 2020-04-11 ENCOUNTER — Ambulatory Visit: Payer: Medicare Other | Admitting: Family Medicine

## 2021-12-23 IMAGING — CR DG CHEST 2V
2 series · 2 of 2 positions shown · non-contrast
Comparison: 01/19/2017

CLINICAL DATA: Shortness of breath and fatigue for 3 weeks. Atrial
fibrillation. Coronary artery disease.

EXAM:
CHEST - 2 VIEW

[w chest pa]
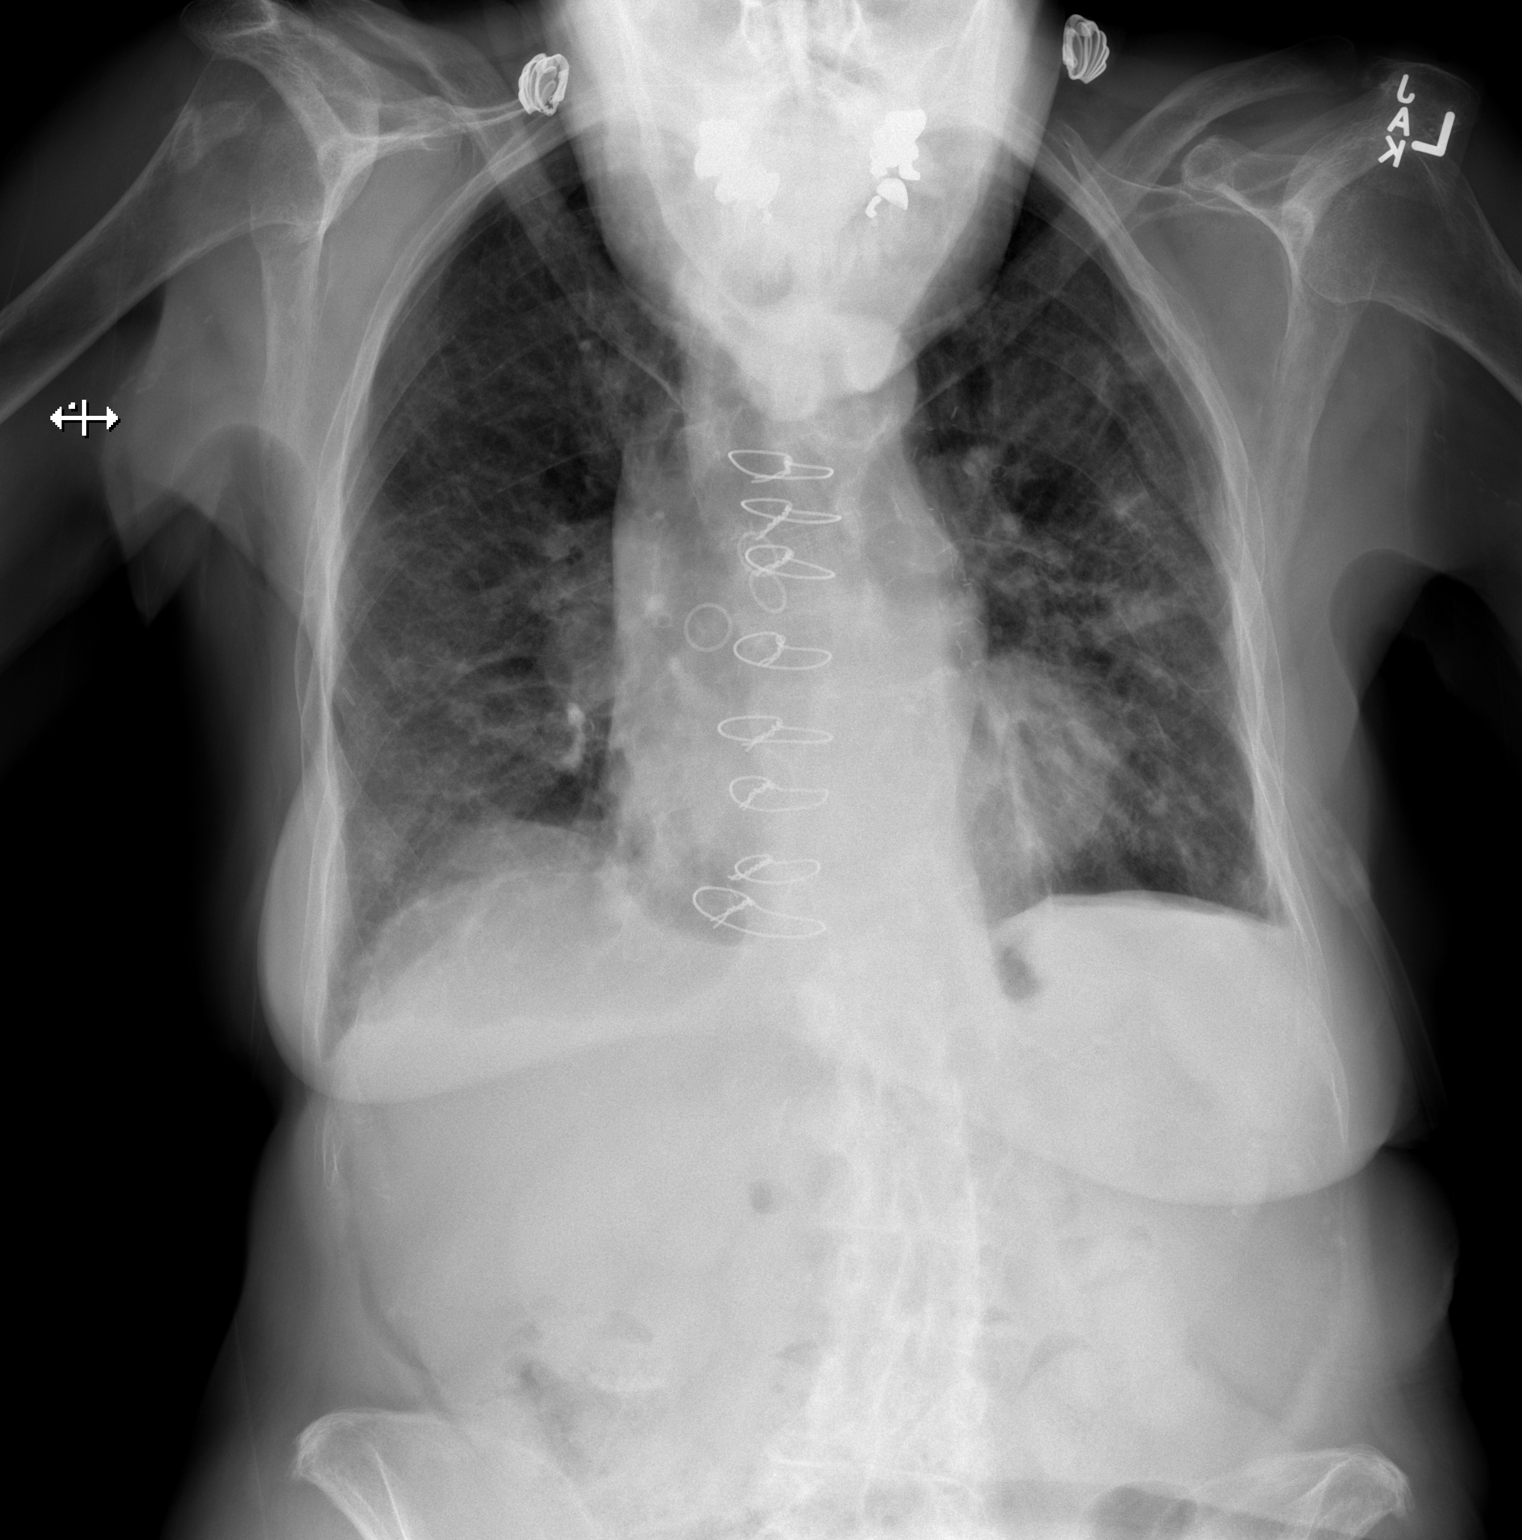

[w chest lat]
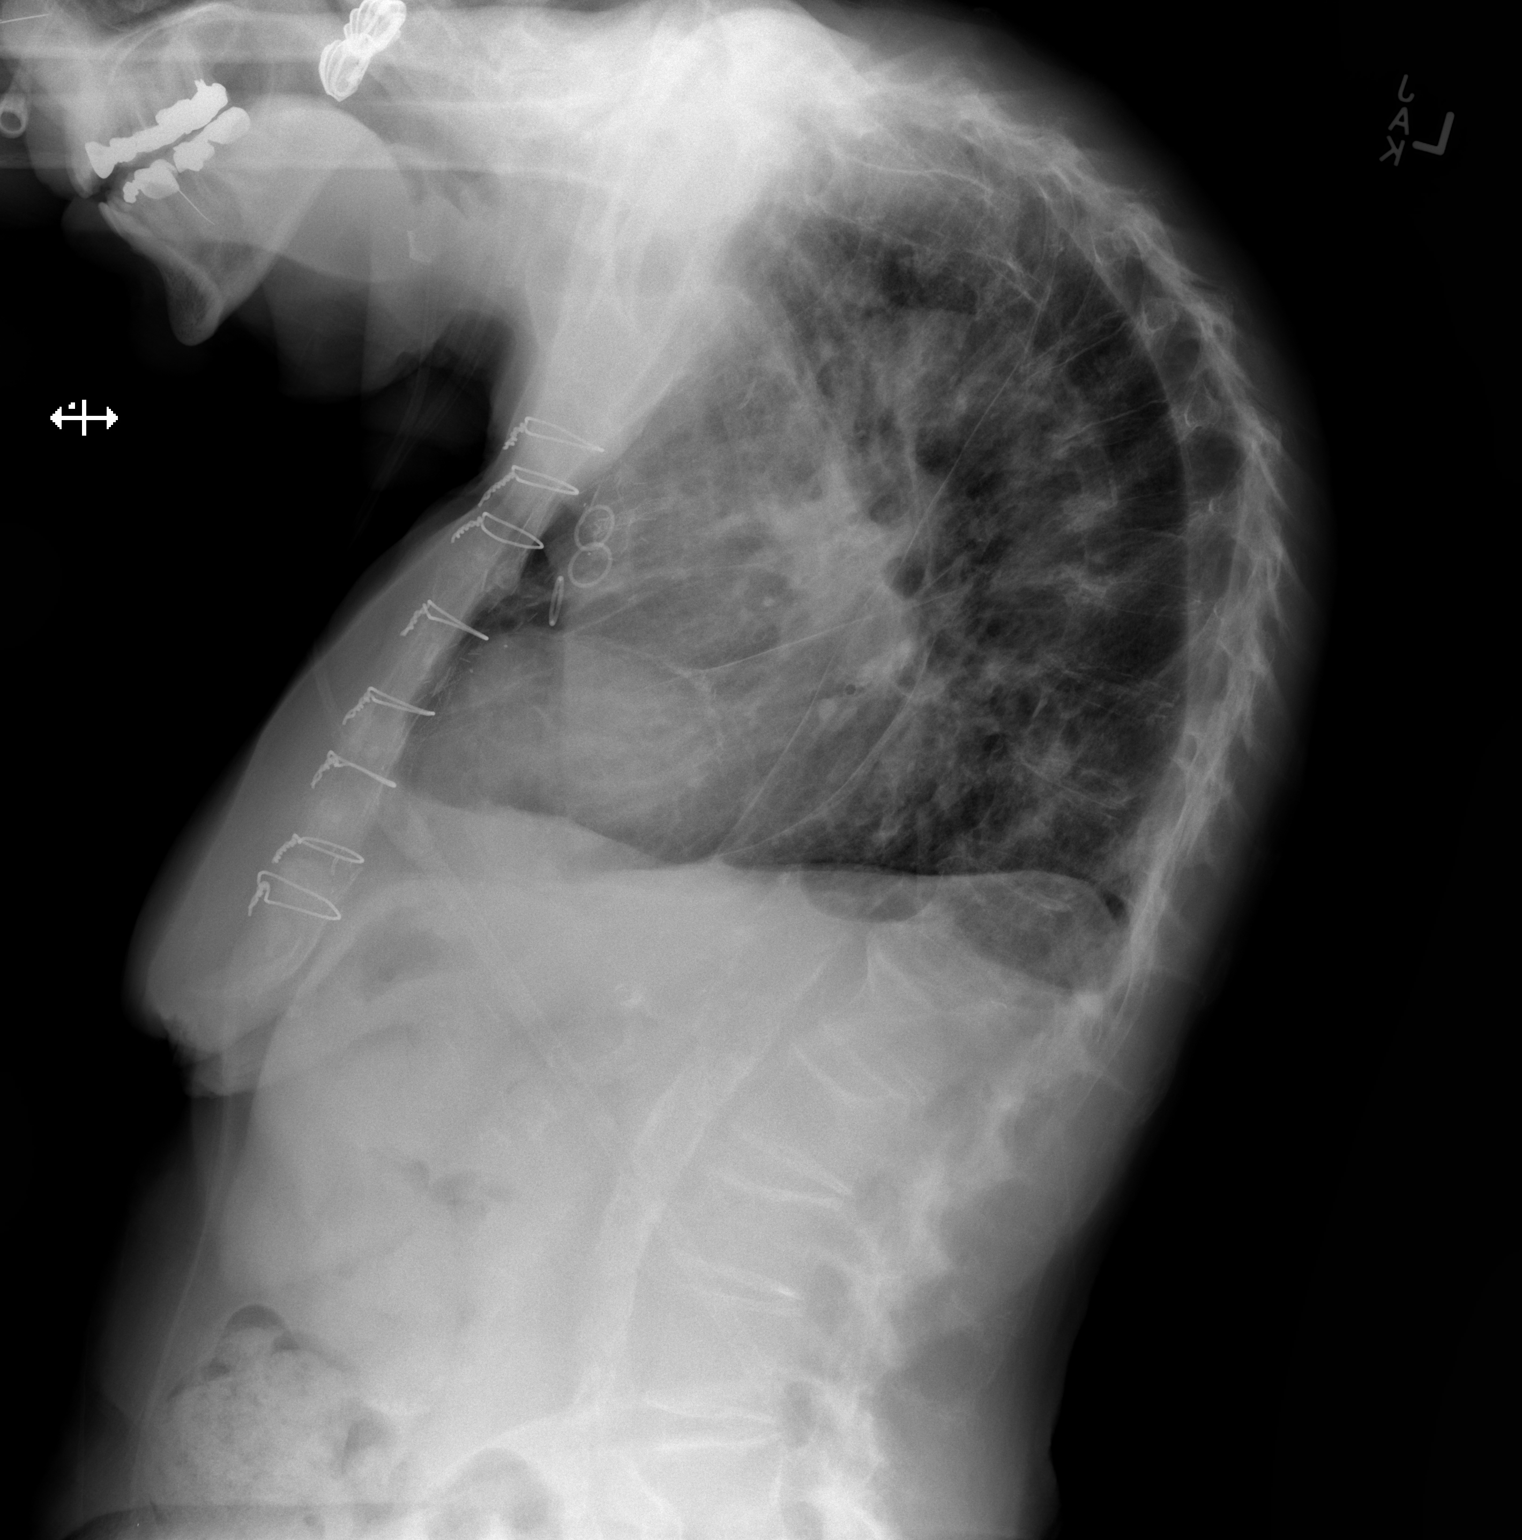

[2 of 2 positions shown; findings below may reference images not displayed]

FINDINGS: Heart size remains within normal limits. Prior CABG again noted.
Aortic atherosclerosis incidentally noted. Pleural-parenchymal
scarring is again seen in both lower lungs, however there is no
evidence of acute infiltrate or pleural effusion.
IMPRESSION: Stable bilateral pleural-parenchymal scarring. No active
cardiopulmonary disease.
